# Patient Record
Sex: Female | Born: 1945
Health system: Southern US, Community
[De-identification: ages and names within clinical notes are randomized; demographics above are authoritative.]

## PROBLEM LIST (undated history)

## (undated) DIAGNOSIS — M1990 Unspecified osteoarthritis, unspecified site: Secondary | ICD-10-CM

## (undated) DIAGNOSIS — Z6841 Body Mass Index (BMI) 40.0 and over, adult: Secondary | ICD-10-CM

## (undated) DIAGNOSIS — R7303 Prediabetes: Secondary | ICD-10-CM

## (undated) DIAGNOSIS — J40 Bronchitis, not specified as acute or chronic: Secondary | ICD-10-CM

## (undated) DIAGNOSIS — K219 Gastro-esophageal reflux disease without esophagitis: Secondary | ICD-10-CM

## (undated) DIAGNOSIS — J329 Chronic sinusitis, unspecified: Secondary | ICD-10-CM

## (undated) DIAGNOSIS — I10 Essential (primary) hypertension: Secondary | ICD-10-CM

## (undated) DIAGNOSIS — I872 Venous insufficiency (chronic) (peripheral): Secondary | ICD-10-CM

## (undated) DIAGNOSIS — F419 Anxiety disorder, unspecified: Secondary | ICD-10-CM

## (undated) DIAGNOSIS — M810 Age-related osteoporosis without current pathological fracture: Secondary | ICD-10-CM

## (undated) DIAGNOSIS — J45909 Unspecified asthma, uncomplicated: Secondary | ICD-10-CM

## (undated) HISTORY — DX: Prediabetes: R73.03

## (undated) HISTORY — PX: HEEL SPUR SURGERY: SHX665

## (undated) HISTORY — PX: CARPAL TUNNEL RELEASE: SHX101

## (undated) HISTORY — DX: Unspecified asthma, uncomplicated: J45.909

## (undated) HISTORY — PX: REPLACEMENT TOTAL KNEE: SUR1224

## (undated) HISTORY — DX: Venous insufficiency (chronic) (peripheral): I87.2

## (undated) HISTORY — PX: ABDOMINAL HYSTERECTOMY: SHX81

## (undated) HISTORY — DX: Morbid (severe) obesity due to excess calories: E66.01

## (undated) HISTORY — PX: CHOLECYSTECTOMY: SHX55

## (undated) HISTORY — DX: Body Mass Index (BMI) 40.0 and over, adult: Z684

## (undated) HISTORY — DX: Unspecified osteoarthritis, unspecified site: M19.90

## (undated) HISTORY — DX: Chronic sinusitis, unspecified: J32.9

## (undated) HISTORY — PX: SHOULDER SURGERY: SHX246

## (undated) HISTORY — DX: Age-related osteoporosis without current pathological fracture: M81.0

---

## 2006-12-20 LAB — HM COLONOSCOPY

## 2009-09-21 ENCOUNTER — Ambulatory Visit: Payer: Self-pay | Admitting: Family Medicine

## 2009-11-25 ENCOUNTER — Ambulatory Visit: Payer: Self-pay | Admitting: Family Medicine

## 2010-03-29 ENCOUNTER — Ambulatory Visit: Payer: Self-pay | Admitting: Family Medicine

## 2010-06-17 ENCOUNTER — Encounter: Admission: RE | Admit: 2010-06-17 | Discharge: 2010-06-17 | Payer: Self-pay | Admitting: Family Medicine

## 2010-06-17 ENCOUNTER — Ambulatory Visit: Payer: Self-pay | Admitting: Family Medicine

## 2010-07-29 ENCOUNTER — Encounter: Payer: Self-pay | Admitting: Family Medicine

## 2010-08-19 ENCOUNTER — Ambulatory Visit: Payer: Self-pay | Admitting: Family Medicine

## 2010-08-19 DIAGNOSIS — I10 Essential (primary) hypertension: Secondary | ICD-10-CM | POA: Insufficient documentation

## 2010-08-19 DIAGNOSIS — I872 Venous insufficiency (chronic) (peripheral): Secondary | ICD-10-CM

## 2010-08-19 DIAGNOSIS — K219 Gastro-esophageal reflux disease without esophagitis: Secondary | ICD-10-CM | POA: Insufficient documentation

## 2010-08-19 HISTORY — DX: Venous insufficiency (chronic) (peripheral): I87.2

## 2010-08-20 ENCOUNTER — Encounter (INDEPENDENT_AMBULATORY_CARE_PROVIDER_SITE_OTHER): Payer: Self-pay

## 2010-09-01 ENCOUNTER — Encounter: Payer: Self-pay | Admitting: Family Medicine

## 2010-09-02 ENCOUNTER — Telehealth (INDEPENDENT_AMBULATORY_CARE_PROVIDER_SITE_OTHER): Payer: Self-pay

## 2010-09-02 ENCOUNTER — Encounter: Payer: Self-pay | Admitting: Family Medicine

## 2010-09-23 ENCOUNTER — Telehealth (INDEPENDENT_AMBULATORY_CARE_PROVIDER_SITE_OTHER): Payer: Self-pay | Admitting: *Deleted

## 2010-09-23 ENCOUNTER — Telehealth: Payer: Self-pay | Admitting: Family Medicine

## 2010-10-18 NOTE — Miscellaneous (Signed)
  Clinical Lists Changes  Observations: Added new observation of FLU VAX#1VIS: 04/12/10 version given August 20, 2010. (08/20/2010 9:57) Added new observation of FLU VAXLOT: [id number] (08/20/2010 9:57) Added new observation of FLU VAX EXP: 03/18/2011 (08/20/2010 9:57) Added new observation of FLU VAXBY: Levonne Spiller EMT-P (08/20/2010 9:57) Added new observation of FLU VAXRTE: IM (08/20/2010 9:57) Added new observation of FLU VAX DSE: 0.5 ml (08/20/2010 9:57) Added new observation of FLU VAXMFR: GlaxoSmithKline (08/20/2010 9:57) Added new observation of FLU VAX SITE: right deltoid (08/20/2010 9:57) Added new observation of FLU VAX: FLULAVAL (08/20/2010 9:57)      Immunizations Administered:  Influenza Vaccine:    Vaccine Type: FLULAVAL    Site: right deltoid    Mfr: GlaxoSmithKline    Dose: 0.5 ml    Route: IM    Given by: Levonne Spiller EMT-P    Exp. Date: 03/18/2011    Lot #: [id number]    VIS given: 04/12/10 version given August 20, 2010.  Flu Vaccine Consent Questions:    Do you have a history of severe allergic reactions to this vaccine? no    Any prior history of allergic reactions to egg and/or gelatin? no    Do you have a sensitivity to the preservative Thimersol? no    Do you have a past history of Guillan-Barre Syndrome? no    Do you currently have an acute febrile illness? no    Have you ever had a severe reaction to latex? no    Vaccine information given and explained to patient? yes    Are you currently pregnant? no

## 2010-10-18 NOTE — Assessment & Plan Note (Signed)
Summary: FOLLOW UP VISIT/JBB   Vital Signs:  Patient Profile:   65 Years Old Female CC:      Follow-up Visit Height:     67 inches Weight:      276 pounds BMI:     43.38 O2 Sat:      99 % O2 treatment:    Room Air Temp:     97.1 degrees F oral Pulse rate:   75 / minute Pulse rhythm:   regular Resp:     18 per minute BP sitting:   127 / 75  (left arm)  Pt. in pain?   yes    Location:   shoulder    Intensity:   6    Type:       aching  Vitals Entered By: Levonne Spiller EMT-P (August 19, 2010 2:47 PM)              Is Patient Diabetic? No  Does patient need assistance? Functional Status Self care Ambulation Normal      Current Allergies: ! * ORAL PREDNISONEHistory of Present Illness History from: patient Reason for visit: follow up of multiple problems Chief Complaint: Follow-up Visit History of Present Illness: The patient is presenting today for followup visit. She reports that she has been taking the omeprazole and she reports that the acid reflux has improved considerably to the point where she thinks she can come off the omeprazole at this time. She reports that she is feeling better. She reports that she is still suffering with the osteoarthritis especially in the right knee. She did have a left knee total replacement. She reports that she is seeing an orthopedic specialist that she reports that she has received a steroid injection in the right knee but the effects of it have worn off 3 months. She's now having significant pain in the right knee. She reports that with walking she has significant pain. She also has pain in the shoulders and the lower back. He reports that she is starting to wear her prescription compression hoses more now that she had done in the past. She reports that her legs feel much better when she wears the compression hoses. In addition, she reports that she's tolerating her blood pressure medication with no difficulty. She reports that otherwise he  feels good. She would like to go ahead and get a flu vaccine this year.  She also reports that she is in the process of applying for disability benefits.  She would like for me to send her a letter describing that she has been complaining of osteoarthritis over the past year which she has.  I told her that I could give her a note and I would also send to get her records from orthopedics to get that information as well.    REVIEW OF SYSTEMS Constitutional Symptoms      Denies fever, chills, night sweats, weight loss, weight gain, and fatigue.  Eyes       Complains of change in vision.      Denies eye pain, eye discharge, glasses, contact lenses, and eye surgery. Ear/Nose/Throat/Mouth       Denies hearing loss/aids, change in hearing, ear pain, ear discharge, dizziness, frequent runny nose, frequent nose bleeds, sinus problems, sore throat, hoarseness, and tooth pain or bleeding.  Respiratory       Denies dry cough, productive cough, wheezing, shortness of breath, asthma, bronchitis, and emphysema/COPD.  Cardiovascular       Denies murmurs, chest pain,  and tires easily with exhertion.    Gastrointestinal       Denies stomach pain, nausea/vomiting, diarrhea, constipation, blood in bowel movements, and indigestion. Genitourniary       Denies painful urination, kidney stones, and loss of urinary control. Neurological       Denies paralysis, seizures, and fainting/blackouts. Musculoskeletal       Complains of joint pain, joint stiffness, decreased range of motion, and swelling.      Denies muscle pain, redness, muscle weakness, and gout.      Comments: chronic venous insufficiency and swelling bilateral lower extremities Skin       Denies bruising, unusual mles/lumps or sores, and hair/skin or nail changes.  Psych       Denies mood changes, temper/anger issues, anxiety/stress, speech problems, depression, and sleep problems.  Past History:  Past Medical History: Hypertension Osteoarthritis    Bilateral venous stasis edema bilateral lower extremities-severe Overweight/obesity GERD  Past Surgical History: Left knee total replacement  Family History: hypertension Diabetes mellitus Overweight/obesity  Social History: Married (second) 2008 Never Smoked Alcohol use-no Drug use-no Occupation: Producer, television/film/video  Smoking Status:  never Drug Use:  no Physical Exam General appearance: well developed, well nourished, no acute distress Head: normocephalic, atraumatic Eyes: conjunctivae and lids normal Pupils: equal, round, reactive to light Ears: normal, no lesions or deformities Nasal: mucosa pink, nonedematous, no septal deviation, turbinates normal Oral/Pharynx: tongue normal, posterior pharynx without erythema or exudate Neck: neck supple,  trachea midline, no masses Chest/Lungs: no rales, wheezes, or rhonchi bilateral, breath sounds equal without effort Heart: regular rate and  rhythm, no murmur Abdomen: soft, non-tender without obvious organomegaly Extremities: 3+ edema bilateral lower extremities Neurological: grossly intact and non-focal Back: tenderness in lower back, tight paraspinal muscles bilateral Skin: no obvious rashes or lesions MSE: Him Assessment New Problems: OSTEOARTHROS UNSPEC GEN/LOC OTH SPEC SITES (ICD-715.98) UNSPECIFIED VENOUS INSUFFICIENCY (ICD-459.81) GERD (ICD-530.81) UNSPECIFIED ESSENTIAL HYPERTENSION (ICD-401.9)   Patient Education: Patient and/or caregiver instructed in the following: rest, exercise, weight loss. The risks, benefits and possible side effects were clearly explained and discussed with the patient.  The patient verbalized clear understanding.  The patient was given instructions to return if symptoms don't improve, worsen or new changes develop.  If it is not during clinic hours and the patient cannot get back to this clinic then the patient was told to seek medical care at an available urgent care or emergency department.  The  patient verbalized understanding.   Demonstrates willingness to comply.  Plan Planning Comments:   I have recommened that the patient continue to follow closely with her orthopedic specialist regarding her severe osteoarthritis in the knees and lower back pain. The patient is saying that she can no longer stand for long periods of time because of the severe osteoarthritis. She can no longer do hair because of the chronic venous insufficiency that has developed in her legs and the severe edema. I told her that she could discontinue the omeprazole at this time but to keep some of the medication available to use if she has recurrent symptoms of acid reflux. She verbalized understanding. The patient received a flu vaccine today. The patient tolerated it well.  I told the patient that I would send to her orthopedist to get records of her visits and I would be willing to write her a letter regarding the symptoms of osteoarthritis that she's been having in the right knee and complaints of generalized bodyaches in the shoulders and lower back.  Follow Up: 3 months  The patient and/or caregiver has been counseled thoroughly with regard to medications prescribed including dosage, schedule, interactions, rationale for use, and possible side effects and they verbalize understanding.  Diagnoses and expected course of recovery discussed and will return if not improved as expected or if the condition worsens. Patient and/or caregiver verbalized understanding.  The patient received an influenza vaccine today and tolerated well.   Patient Instructions: 1)  Please wear your prescription compression hoses on a daily basis to try and improve the chronic swelling in the lower legs. 2)  Check your blood sugars regularly. If your readings are usually above 200 or below 70 you should contact our office. 3)  Check your Blood Pressure regularly. If it is above: 140/90 you should make an appointment. 4)  Low sodium diet  recommended as we discussed 5)  I plan to check blood work when you come back for your next visit.  Please call us if you would like to have your fasting blood work done in [place] and have the results sent here before your next scheduled appointment.  6)  The patient was informed that there is no on-call provider or services available at this clinic during off-hours (when the clinic is closed).  If the patient developed a problem or concern that required immediate attention, the patient was advised to go the the nearest available urgent care or emergency department for medical care.  The patient verbalized understanding.

## 2010-10-18 NOTE — Progress Notes (Signed)
Summary: Office Visit  Office Visit   Imported By: Rosine Beat 07/29/2010 12:36:10  _____________________________________________________________________  External Attachment:    Type:   Image     Comment:   External Document

## 2010-10-20 NOTE — Letter (Signed)
Summary: Letter  Letter   Imported By: Dorna Leitz 09/02/2010 16:29:34  _____________________________________________________________________  External Attachment:    Type:   Image     Comment:   External Document

## 2010-10-20 NOTE — Progress Notes (Signed)
Summary: Orthopaedic visit  Orthopaedic visit   Imported By: Dorna Leitz 09/01/2010 13:56:27  _____________________________________________________________________  External Attachment:    Type:   Image     Comment:   External Document

## 2010-10-20 NOTE — Progress Notes (Addendum)
  Phone Note Call from Patient   Caller: Patient Summary of Call: Patient called to talk to Dr. Laural Benes regarding an appointment for the first of next week.  She needs to talk to the doctor regarding her husband.  You can contact the patient at [phone]. Initial call taken by: Dorna Leitz,  September 23, 2010 2:35 PM  Follow-up for Phone Call        Phone Call Completed Follow-up by: Levonne Spiller EMT-P,  September 23, 2010 2:50 PM

## 2010-10-20 NOTE — Progress Notes (Signed)
  Phone Note Call from Patient   Caller: Patient Summary of Call: Patient called to talk to Dr. Laural Benes regarding an appointment for the first of next week.  She needs to talk to the doctor regarding her husband.  You can contact the patient at [phone]. Initial call taken by: Dorna Leitz,  September 23, 2010 2:55 PM  Follow-up for Phone Call        Pt. made an appt for her husband, Grenda Lora. Mr. Theissen will be seeing Dr Laural Benes for a follow-up visit at [facility]. The appt is set for Monday 09/26/2010 @ 11:00 Follow-up by: Levonne Spiller EMT-P,  September 23, 2010 4:04 PM

## 2010-10-20 NOTE — Letter (Signed)
Summary: Handicapped placard for perm diability  Handicapped placard for perm diability   Imported By: Dorna Leitz 09/02/2010 16:28:33  _____________________________________________________________________  External Attachment:    Type:   Image     Comment:   External Document

## 2010-10-20 NOTE — Letter (Signed)
Summary: Generic Letter  [facility]  [address]   [place], [place] [address]   Phone: [phone]  Fax: [phone]    09/02/2010  [name] [address] [place], [place]  [address]  TO WHOM IT MAY CONCERN:  This letter is to confirm that Megan Perez (DOB 1945/10/05) has been a patient that I have been following for over 1 year.  With the patient's request and permission I am writing this letter to confirm that she has been treated by me and other medical providers for severe osteoarthritis.  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    This patient has had a left total knee replacement and suffers from degenerative osteoarthritis of the right knee.  She has been followed for this by an orthopedic specialist and I have reviewed the records fromt the office visit that do confirm osteoarthritis - degenerative and highly symptomatic in the right knee joint requiring treatments.  The patient will most likely require ongoing orthopedic care from the orthopedist for this condition and may require knee replacement or more invasive treatments in the future.         Sincerely,   Standley Dakins MD

## 2010-10-20 NOTE — Progress Notes (Signed)
  Phone Note Refill Request   Caller: Patient Reason for Call: Refill Medication Summary of Call: Patient called because she needs a refill on Maloxicam 15 miligram.  Please call the prescription at the [facility] pharmacy in [place], [place].  She also would like for Dr. Laural Benes to call regarding some information they had discuss at her last visit.  She stated she did not receive the information yet.  The patient's number is [phone] Initial call taken by: Dorna Leitz,  September 02, 2010 11:37 AM  Follow-up for Phone Call        Pt. refill request was sent to the [facility] [address] Steele Sizer. Pt notified about not receiving the orthopedic records yet. Pt advise she will have the records sent to this office. At that time, Dr. Laural Benes will write her the letter she requires. Follow-up by: Levonne Spiller EMT-P,  September 02, 2010 12:25 PM    Prescriptions: MELOXICAM 15 MG TABS (MELOXICAM) take 1 by mouth daily for arthritis pain  #30 x 2   Entered by:   Levonne Spiller EMT-P   Authorized by:   Standley Dakins MD   Signed by:   Levonne Spiller EMT-P on 09/02/2010   Method used:   Electronically to        [facility] (retail)       [address]       [place], [place]  [address]       Ph: [phone]       Fax: [phone]   RxID:   [id number]

## 2010-11-24 ENCOUNTER — Telehealth: Payer: Self-pay | Admitting: Family Medicine

## 2010-11-25 ENCOUNTER — Telehealth: Payer: Self-pay | Admitting: Family Medicine

## 2010-11-29 NOTE — Progress Notes (Signed)
  Phone Note Call from Patient   Summary of Call: PATIENT CALLED STATED THAT SHE IS OUT OF MEDICATION  HAVE NO MORE REFILLS OMEPRAZOLE 20 MG PATIENT ALSO WANTED TO KNOW COULD YOU CALL IT IN FOR 90 DAY SUPPLY..  IF NEEDED TO CONTACT HER PLEASE CALL HER HOME NUIMBER  Initial call taken by: Eugenio Hoes,  November 24, 2010 2:09 PM  Follow-up for Phone Call        Please find out which pharmacy I need to send the prescriptions to. Thanks. Follow-up by: Standley Dakins MD,  November 25, 2010 10:55 AM    New/Updated Medications: OMEPRAZOLE 20 MG TBEC (OMEPRAZOLE) take 1 by mouth every morning Prescriptions: OMEPRAZOLE 20 MG TBEC (OMEPRAZOLE) take 1 by mouth every morning  #90 x 1   Entered and Authorized by:   Standley Dakins MD   Signed by:   Standley Dakins MD on 11/25/2010   Method used:   Electronically to        [facility] (retail)       [address]       [place], [place]  [address]       Ph: [phone]       Fax: [phone]   RxID:   [id number]

## 2010-11-29 NOTE — Progress Notes (Signed)
  Phone Note Call from Patient   Summary of Call: Megan Perez CALLED YESTERDAY SHE WANTED TO TALK WITH YOU  PLEASE CALL HER @ [phone] Initial call taken by: Eugenio Hoes,  November 25, 2010 2:08 PM  Follow-up for Phone Call        Called and spoke with patient .  Her husband is having bilateral nephrectomy done on 3/19 at [facility] and then being transferred to [facility].    Follow-up by: Standley Dakins MD,  November 25, 2010 2:38 PM

## 2010-12-06 ENCOUNTER — Telehealth: Payer: Self-pay | Admitting: Internal Medicine

## 2010-12-15 NOTE — Progress Notes (Signed)
  Phone Note Call from Patient   Summary of Call:  need refill on moloxicam 15 mg patient request to have a 3 months supply please send to [facility] [phone] out of prescription s have no refill request. Advise patient would send to Dr.Jaythan Hinely because Dr. Laural Benes will not be in until thursday Initial call taken by: Eugenio Hoes,  December 06, 2010 2:43 PM    Prescriptions: MELOXICAM 15 MG TABS (MELOXICAM) take 1 by mouth daily for arthritis pain  #30 x 0   Entered and Authorized by:   J. Juline Patch MD   Signed by:   Shela Commons. Juline Patch MD on 12/06/2010   Method used:   Electronically to        [facility] (retail)       [address]       [place], [place]  [address]       Ph: [phone]       Fax: [phone]   RxID:   [id number]

## 2011-01-16 ENCOUNTER — Encounter (INDEPENDENT_AMBULATORY_CARE_PROVIDER_SITE_OTHER): Payer: PRIVATE HEALTH INSURANCE | Admitting: Family Medicine

## 2011-01-16 DIAGNOSIS — M129 Arthropathy, unspecified: Secondary | ICD-10-CM

## 2011-01-16 DIAGNOSIS — R7309 Other abnormal glucose: Secondary | ICD-10-CM

## 2011-01-16 DIAGNOSIS — I10 Essential (primary) hypertension: Secondary | ICD-10-CM

## 2011-01-16 DIAGNOSIS — E669 Obesity, unspecified: Secondary | ICD-10-CM

## 2011-01-16 DIAGNOSIS — Z79899 Other long term (current) drug therapy: Secondary | ICD-10-CM

## 2011-01-16 DIAGNOSIS — Z0000 Encounter for general adult medical examination without abnormal findings: Secondary | ICD-10-CM

## 2011-02-01 ENCOUNTER — Telehealth: Payer: Self-pay | Admitting: Family Medicine

## 2011-02-02 NOTE — Telephone Encounter (Signed)
Faxed order in

## 2011-03-30 ENCOUNTER — Other Ambulatory Visit: Payer: Self-pay

## 2011-03-30 MED ORDER — LISINOPRIL 20 MG PO TABS: 20.0000 mg | ORAL_TABLET | Freq: Every day | ORAL | Status: DC

## 2011-04-04 ENCOUNTER — Other Ambulatory Visit: Payer: Self-pay

## 2011-04-04 MED ORDER — LISINOPRIL 20 MG PO TABS: 20.0000 mg | ORAL_TABLET | Freq: Every day | ORAL | Status: DC

## 2011-06-12 ENCOUNTER — Telehealth: Payer: Self-pay | Admitting: Family Medicine

## 2011-06-12 MED ORDER — LISINOPRIL 20 MG PO TABS: 20.0000 mg | ORAL_TABLET | Freq: Every day | ORAL | Status: DC

## 2011-06-12 NOTE — Telephone Encounter (Signed)
Fax came in med reordered

## 2011-06-13 ENCOUNTER — Other Ambulatory Visit: Payer: Self-pay | Admitting: *Deleted

## 2011-06-13 DIAGNOSIS — K219 Gastro-esophageal reflux disease without esophagitis: Secondary | ICD-10-CM

## 2011-06-13 MED ORDER — OMEPRAZOLE 20 MG PO CPDR: 20.0000 mg | DELAYED_RELEASE_CAPSULE | Freq: Every day | ORAL | Status: DC

## 2011-08-31 ENCOUNTER — Encounter: Payer: Self-pay | Admitting: Family Medicine

## 2011-08-31 ENCOUNTER — Ambulatory Visit (INDEPENDENT_AMBULATORY_CARE_PROVIDER_SITE_OTHER): Payer: PRIVATE HEALTH INSURANCE | Admitting: Family Medicine

## 2011-08-31 VITALS — BP 136/72 | HR 77 | Wt 275.0 lb

## 2011-08-31 DIAGNOSIS — I10 Essential (primary) hypertension: Secondary | ICD-10-CM

## 2011-08-31 DIAGNOSIS — M1710 Unilateral primary osteoarthritis, unspecified knee: Secondary | ICD-10-CM

## 2011-08-31 DIAGNOSIS — R7309 Other abnormal glucose: Secondary | ICD-10-CM

## 2011-08-31 DIAGNOSIS — M1711 Unilateral primary osteoarthritis, right knee: Secondary | ICD-10-CM

## 2011-08-31 DIAGNOSIS — R7302 Impaired glucose tolerance (oral): Secondary | ICD-10-CM

## 2011-08-31 DIAGNOSIS — E669 Obesity, unspecified: Secondary | ICD-10-CM

## 2011-08-31 NOTE — Patient Instructions (Addendum)
Switch to Tylenol to help with your aches and pains and see what that will do to help your swelling. Go from lying to sitting to standing to see what this will do to help with your dizziness

## 2011-08-31 NOTE — Progress Notes (Signed)
  Subjective:    Patient ID: Megan Perez, female    DOB: February 18, 1946, 65 y.o.   MRN: [id number]  HPI She is here for evaluation of dizziness. It occurs usually when she gets out of bed in the morning and goes away relatively quickly. It does not occur the rest of the day. She also has been having difficulty with swelling of the hands and feet that she notes is made worse when she uses Aleve. She is using Aleve to help with knee pain   Review of Systems     Objective:   Physical Exam alert and in no distress. EOMI. Tympanic membranes and canals are normal. Throat is clear. Tonsils are normal. Neck is supple without adenopathy or thyromegaly. Cardiac exam shows a regular sinus rhythm without murmurs or gallops. Lungs are clear to auscultation.        Assessment & Plan:   1. Arthritis of right knee  Amb ref  Medical Nutrition Therapy (MNT)  2. Obesity (BMI 30-39.9)  Amb ref  Medical Nutrition Therapy (MNT)  3. Hypertension  Amb ref  Medical Nutrition Therapy (MNT)  4. Glucose intolerance (impaired glucose tolerance)  Amb ref  Medical Nutrition Therapy (MNT)   I discussed her arthritis , risk for diabetes and knee pain in regard to her weight. I will refer her to medical nutrition. Also recommend she get up more slowly from a sitting position it will probably help with her transient dizziness which was probably position related. She will also switch to Tylenol to see if this will help with her edema.

## 2011-11-05 ENCOUNTER — Other Ambulatory Visit: Payer: Self-pay

## 2011-11-05 ENCOUNTER — Emergency Department (HOSPITAL_COMMUNITY)
Admission: EM | Admit: 2011-11-05 | Discharge: 2011-11-05 | Disposition: A | Discharge status: Home / Self Care | Payer: Medicare Other | Attending: Emergency Medicine | Admitting: Emergency Medicine

## 2011-11-05 ENCOUNTER — Emergency Department (HOSPITAL_COMMUNITY): Payer: Medicare Other

## 2011-11-05 ENCOUNTER — Encounter (HOSPITAL_COMMUNITY): Payer: Self-pay | Admitting: *Deleted

## 2011-11-05 DIAGNOSIS — I10 Essential (primary) hypertension: Secondary | ICD-10-CM | POA: Insufficient documentation

## 2011-11-05 DIAGNOSIS — R0602 Shortness of breath: Secondary | ICD-10-CM | POA: Insufficient documentation

## 2011-11-05 DIAGNOSIS — R079 Chest pain, unspecified: Secondary | ICD-10-CM | POA: Insufficient documentation

## 2011-11-05 DIAGNOSIS — J189 Pneumonia, unspecified organism: Secondary | ICD-10-CM | POA: Insufficient documentation

## 2011-11-05 DIAGNOSIS — R509 Fever, unspecified: Secondary | ICD-10-CM | POA: Insufficient documentation

## 2011-11-05 DIAGNOSIS — R51 Headache: Secondary | ICD-10-CM | POA: Insufficient documentation

## 2011-11-05 HISTORY — DX: Essential (primary) hypertension: I10

## 2011-11-05 LAB — CBC
HCT: 34.9 % — ABNORMAL LOW (ref 36.0–46.0)
Hemoglobin: 11.3 g/dL — ABNORMAL LOW (ref 12.0–15.0)
MCH: 29.5 pg (ref 26.0–34.0)
MCHC: 32.4 g/dL (ref 30.0–36.0)
MCV: 91.1 fL (ref 78.0–100.0)
Platelets: 295 10*3/uL (ref 150–400)
RBC: 3.83 MIL/uL — ABNORMAL LOW (ref 3.87–5.11)
RDW: 15 % (ref 11.5–15.5)
WBC: 4.5 10*3/uL (ref 4.0–10.5)

## 2011-11-05 LAB — DIFFERENTIAL
Basophils Absolute: 0 10*3/uL (ref 0.0–0.1)
Basophils Relative: 0 % (ref 0–1)
Eosinophils Absolute: 0.2 10*3/uL (ref 0.0–0.7)
Eosinophils Relative: 3 % (ref 0–5)
Lymphocytes Relative: 25 % (ref 12–46)
Lymphs Abs: 1.1 10*3/uL (ref 0.7–4.0)
Monocytes Absolute: 0.4 10*3/uL (ref 0.1–1.0)
Monocytes Relative: 9 % (ref 3–12)
Neutro Abs: 2.8 10*3/uL (ref 1.7–7.7)
Neutrophils Relative %: 62 % (ref 43–77)

## 2011-11-05 LAB — BASIC METABOLIC PANEL
BUN: 11 mg/dL (ref 6–23)
CO2: 28 mEq/L (ref 19–32)
Calcium: 9.2 mg/dL (ref 8.4–10.5)
Chloride: 102 mEq/L (ref 96–112)
Creatinine, Ser: 0.78 mg/dL (ref 0.50–1.10)
GFR calc Af Amer: >90 mL/min (ref >90)
GFR calc non Af Amer: 85 mL/min — ABNORMAL LOW (ref >90)
Glucose, Bld: 109 mg/dL — ABNORMAL HIGH (ref 70–99)
Potassium: 3.1 mEq/L — ABNORMAL LOW (ref 3.5–5.1)
Sodium: 141 mEq/L (ref 135–145)

## 2011-11-05 LAB — CARDIAC PANEL(CRET KIN+CKTOT+MB+TROPI)
CK, MB: 2.2 ng/mL (ref 0.3–4.0)
Relative Index: 1.3 (ref 0.0–2.5)
Total CK: 168 U/L (ref 7–177)
Troponin I: <0.3 ng/mL (ref <0.30)

## 2011-11-05 MED ORDER — LEVOFLOXACIN IN D5W 500 MG/100ML IV SOLN
500.0000 mg | INTRAVENOUS | Status: DC
  Administered 2011-11-05: 500 mg via INTRAVENOUS
  Filled 2011-11-05: qty 100

## 2011-11-05 MED ORDER — LEVOFLOXACIN 500 MG PO TABS: 500.0000 mg | ORAL_TABLET | Freq: Every day | ORAL | Status: AC

## 2011-11-05 MED ORDER — SODIUM CHLORIDE 0.9 % IV BOLUS (SEPSIS)
500.0000 mL | Freq: Once | INTRAVENOUS | Status: AC
  Administered 2011-11-05: 500 mL via INTRAVENOUS

## 2011-11-05 MED ORDER — MORPHINE SULFATE 2 MG/ML IJ SOLN
2.0000 mg | Freq: Once | INTRAMUSCULAR | Status: AC
  Administered 2011-11-05: 2 mg via INTRAVENOUS
  Filled 2011-11-05: qty 1

## 2011-11-05 MED ORDER — BENZONATATE 100 MG PO CAPS: 100.0000 mg | ORAL_CAPSULE | Freq: Three times a day (TID) | ORAL | Status: AC

## 2011-11-05 MED ORDER — TRAMADOL HCL 50 MG PO TABS: 50.0000 mg | ORAL_TABLET | Freq: Four times a day (QID) | ORAL | Status: AC | PRN

## 2011-11-05 NOTE — ED Notes (Signed)
Patient is resting comfortably. 

## 2011-11-05 NOTE — ED Notes (Signed)
Vital signs stable. 

## 2011-11-05 NOTE — ED Notes (Signed)
EMS 2 days, fever, chills, chest soreness, no n/v/d sinus headache

## 2011-11-05 NOTE — ED Notes (Signed)
Family at bedside. 

## 2011-11-05 NOTE — Discharge Instructions (Signed)

## 2011-11-05 NOTE — ED Provider Notes (Signed)
History     CSN: [id number]  Arrival date & time 11/05/11  4098   First MD Initiated Contact with Patient 11/05/11 [phone]      Chief Complaint  Patient presents with  . Influenza    (Consider location/radiation/quality/duration/timing/severity/associated sxs/prior treatment) Patient is a 66 y.o. female presenting with flu symptoms. The history is provided by the patient.  Influenza The current episode started 2 days ago. The problem has been gradually worsening. Associated symptoms include chest pain, headaches and shortness of breath. Pertinent negatives include no abdominal pain. The symptoms are aggravated by coughing.  Pt with productive cough of yellow sputum x 2 days with subjective fever and pleuritic chest pain worse with breathing and coughing. Pt also having frontal HA worse with cough. +sore throat. No neck stiffness or sick contacts. No lower ext swelling or pain. No recent travel.   Past Medical History  Diagnosis Date  . Hypertension     Past Surgical History  Procedure Date  . Knee surgery   . Abdominal hysterectomy     No family history on file.  History  Substance Use Topics  . Smoking status: Never Smoker   . Smokeless tobacco: Never Used  . Alcohol Use: No    OB History    Grav Para Term Preterm Abortions TAB SAB Ect Mult Living                  Review of Systems  Constitutional: Positive for fever and fatigue.  HENT: Positive for congestion and sore throat. Negative for ear pain, neck pain, neck stiffness and sinus pressure.   Respiratory: Positive for cough and shortness of breath. Negative for stridor.   Cardiovascular: Positive for chest pain. Negative for palpitations and leg swelling.  Gastrointestinal: Negative for nausea, vomiting and abdominal pain.  Genitourinary: Negative for dysuria and flank pain.  Musculoskeletal: Positive for back pain. Negative for joint swelling.  Skin: Negative for color change, pallor and rash.  Neurological:  Positive for headaches. Negative for weakness and numbness.    Allergies  Prednisone  Home Medications   Current Outpatient Rx  Name Route Sig Dispense Refill  . ACETAMINOPHEN 500 MG PO TABS Oral Take 1,000 mg by mouth every 6 (six) hours as needed. For pain.    . ASPIRIN 81 MG PO TABS Oral Take 81 mg by mouth daily.      . FUROSEMIDE 40 MG PO TABS Oral Take 40 mg by mouth daily.      [name] LISINOPRIL 20 MG PO TABS Oral Take 1 tablet (20 mg total) by mouth daily. 90 tablet 3  . OMEPRAZOLE 20 MG PO CPDR Oral Take 1 capsule (20 mg total) by mouth daily. 30 capsule 4  . BENZONATATE 100 MG PO CAPS Oral Take 1 capsule (100 mg total) by mouth every 8 (eight) hours. 21 capsule 0  . LEVOFLOXACIN 500 MG PO TABS Oral Take 1 tablet (500 mg total) by mouth daily. 7 tablet 0  . TRAMADOL HCL 50 MG PO TABS Oral Take 1 tablet (50 mg total) by mouth every 6 (six) hours as needed for pain. 15 tablet 0    BP 129/94  Pulse 81  Temp(Src) 98.1 F (36.7 C) (Oral)  Resp 18  Ht 5\' 7"  (1.702 m)  Wt 255 lb (115.667 kg)  BMI 39.94 kg/m2  SpO2 99%  Physical Exam  Nursing note and vitals reviewed. Constitutional: She is oriented to person, place, and time. She appears well-developed and well-nourished. No distress.  HENT:  Head: Normocephalic and atraumatic.  Mouth/Throat: Oropharynx is clear and moist.  Eyes: EOM are normal. Pupils are equal, round, and reactive to light.  Neck: Normal range of motion. Neck supple.       No meningismus   Cardiovascular: Normal rate and regular rhythm.   Pulmonary/Chest: Effort normal. No respiratory distress. She has no wheezes. She has rales (Scattered rales). She exhibits tenderness (Chest pain reproduced with pressure over sternum).  Abdominal: Soft. Bowel sounds are normal. There is no tenderness. There is no rebound and no guarding.  Musculoskeletal: Normal range of motion. She exhibits no edema and no tenderness.  Neurological: She is alert and oriented to person,  place, and time.  Skin: Skin is warm and dry. No rash noted. No erythema.  Psychiatric: She has a normal mood and affect. Her behavior is normal.    ED Course  Procedures (including critical care time)  Labs Reviewed  CBC - Abnormal; Notable for the following:    RBC 3.83 (*)    Hemoglobin 11.3 (*)    HCT 34.9 (*)    All other components within normal limits  BASIC METABOLIC PANEL - Abnormal; Notable for the following:    Potassium 3.1 (*)    Glucose, Bld 109 (*)    GFR calc non Af Amer 85 (*)    All other components within normal limits  DIFFERENTIAL  CARDIAC PANEL(CRET KIN+CKTOT+MB+TROPI)   Dg Chest 2 View  11/05/2011  *RADIOLOGY REPORT*  Clinical Data: Cough.  Fever.  Shortness of breath and chest pain for 2 days.  Hypertension.  CHEST - 2 VIEW  Comparison: CT of 06/17/2010  Findings: Lateral view degraded by patient arm position.  Midline trachea.  Borderline cardiomegaly, accentuated by low lung volumes. No pleural effusion or pneumothorax.  Interstitial prominence may be partially technique related.  There is right infrahilar soft tissue fullness.  Mild volume loss at the left lung base.  Cholecystectomy clips.  IMPRESSION:  1.  Right infrahilar soft tissue fullness.  Given the clinical history of fever and cough, most likely an area of infection. Recommend appropriate antibiotic therapy and short-term interval plain film follow-up. If this persists, CT would be recommended to exclude underlying mass/adenopathy and postobstructive atelectasis or pneumonitis. 2.  Borderline cardiomegaly. 3.  Interstitial prominence, possibly related to low lung volumes/poor inspiratory effort.  Original Report Authenticated By: Consuello Bossier, M.D.     1. Pneumonia      Date: 11/05/2011  Rate: 97  Rhythm: normal sinus rhythm  QRS Axis: normal  Intervals: normal  ST/T Wave abnormalities: nonspecific ST changes  Conduction Disutrbances:none  Narrative Interpretation:   Old EKG Reviewed: none  available    MDM   Given nature of chest pain, duration, negative trop, normal EKG, and alternative cause for pain, CAD is very unlikely. F/u with PMD to assure resolution of PNA.        Loren Racer, MD 11/05/11 1415

## 2011-11-05 NOTE — ED Notes (Signed)
Patient transported to X-ray 

## 2011-11-05 NOTE — ED Notes (Signed)
MD at bedside. 

## 2011-11-15 ENCOUNTER — Encounter: Payer: Self-pay | Admitting: Internal Medicine

## 2011-11-18 ENCOUNTER — Other Ambulatory Visit: Payer: Self-pay | Admitting: Family Medicine

## 2011-11-24 ENCOUNTER — Ambulatory Visit (INDEPENDENT_AMBULATORY_CARE_PROVIDER_SITE_OTHER): Payer: Medicare Other | Admitting: Family Medicine

## 2011-11-24 ENCOUNTER — Encounter: Payer: Self-pay | Admitting: Family Medicine

## 2011-11-24 DIAGNOSIS — Z8701 Personal history of pneumonia (recurrent): Secondary | ICD-10-CM

## 2011-11-24 DIAGNOSIS — K219 Gastro-esophageal reflux disease without esophagitis: Secondary | ICD-10-CM

## 2011-11-24 DIAGNOSIS — M129 Arthropathy, unspecified: Secondary | ICD-10-CM

## 2011-11-24 DIAGNOSIS — M1990 Unspecified osteoarthritis, unspecified site: Secondary | ICD-10-CM | POA: Insufficient documentation

## 2011-11-24 MED ORDER — TRAMADOL HCL 50 MG PO TABS: 50.0000 mg | ORAL_TABLET | Freq: Three times a day (TID) | ORAL | Status: AC | PRN

## 2011-11-24 MED ORDER — OMEPRAZOLE 20 MG PO CPDR: 20.0000 mg | DELAYED_RELEASE_CAPSULE | Freq: Every day | ORAL | Status: DC

## 2011-11-24 NOTE — Progress Notes (Signed)
  Subjective:    Patient ID: Megan Perez, female    DOB: 02-Jul-1946, 66 y.o.   MRN: [id number]  HPI She is here for recheck. She was recently in the hospital and evaluated for pneumonia. The record was reviewed. She states that she is doing much better and only having a slight cough. She does have reflux symptoms and uses Prilosec for this. She does have a previous history of GI bleed and states that Tylenol was not controlling her arthritis symptoms.   Review of Systems     Objective:   Physical Exam alert and in no distress. Tympanic membranes and canals are normal. Throat is clear. Tonsils are normal. Neck is supple without adenopathy or thyromegaly. Cardiac exam shows a regular sinus rhythm without murmurs or gallops. Lungs are clear to auscultation.       Assessment & Plan:   1. GERD   2. History of pneumonia   3. Arthritis   4 history of GI bleed I will give her tramadol to use for her arthritis symptoms. She will continue on her other medications. No antibiotic needed at this time since she is doing better.

## 2011-12-21 ENCOUNTER — Telehealth: Payer: Self-pay | Admitting: Family Medicine

## 2011-12-21 MED ORDER — CELECOXIB 200 MG PO CAPS: 200.0000 mg | ORAL_CAPSULE | Freq: Two times a day (BID) | ORAL | Status: AC

## 2011-12-21 NOTE — Telephone Encounter (Signed)
Pt called and states she cannot take the Tramadol.  Makes her cry uncontrollable, depressed, confused.  She asked that you call her.  I advised her not to take anymore Tramadol until she heard from you.       Her pharmacy is [facility]

## 2011-12-21 NOTE — Telephone Encounter (Signed)
Pt called stating that her pharmacy called and said that celebrex would be $354.00 and that she would like something else. i did give her samples of celebrex. She also states that she had been taking tylenol and he has not helped any.

## 2011-12-21 NOTE — Telephone Encounter (Signed)
Explained to her to continue to use Tylenol to help with her arthritis and I called in the Celebrex. She is to use this sparingly.

## 2011-12-21 NOTE — Telephone Encounter (Signed)
Time to refer her to orthopedics if the knee is giving her that much trouble, time to consider joint replacement

## 2011-12-21 NOTE — Telephone Encounter (Signed)
Called left message for pt to call me back .

## 2011-12-21 NOTE — Telephone Encounter (Signed)
Tramadol apparently is causing CNS side effects. I will recommend she use Tylenol as a first choice for her arthritis and Celebrex sparingly as needed.

## 2011-12-25 NOTE — Telephone Encounter (Signed)
Pt doesn't want to have surgery and will call ortho to get appointment

## 2012-02-01 ENCOUNTER — Encounter (HOSPITAL_COMMUNITY): Payer: Self-pay | Admitting: Physical Medicine and Rehabilitation

## 2012-02-01 ENCOUNTER — Emergency Department (HOSPITAL_COMMUNITY)
Admission: EM | Admit: 2012-02-01 | Discharge: 2012-02-01 | Disposition: A | Discharge status: Home / Self Care | Payer: Medicare Other | Attending: Emergency Medicine | Admitting: Emergency Medicine

## 2012-02-01 ENCOUNTER — Emergency Department (HOSPITAL_COMMUNITY): Payer: Medicare Other

## 2012-02-01 DIAGNOSIS — M25559 Pain in unspecified hip: Secondary | ICD-10-CM | POA: Insufficient documentation

## 2012-02-01 DIAGNOSIS — IMO0002 Reserved for concepts with insufficient information to code with codable children: Secondary | ICD-10-CM | POA: Insufficient documentation

## 2012-02-01 DIAGNOSIS — I10 Essential (primary) hypertension: Secondary | ICD-10-CM | POA: Insufficient documentation

## 2012-02-01 DIAGNOSIS — M1710 Unilateral primary osteoarthritis, unspecified knee: Secondary | ICD-10-CM | POA: Insufficient documentation

## 2012-02-01 DIAGNOSIS — M79609 Pain in unspecified limb: Secondary | ICD-10-CM

## 2012-02-01 DIAGNOSIS — R209 Unspecified disturbances of skin sensation: Secondary | ICD-10-CM | POA: Insufficient documentation

## 2012-02-01 DIAGNOSIS — Z7982 Long term (current) use of aspirin: Secondary | ICD-10-CM | POA: Insufficient documentation

## 2012-02-01 LAB — URINALYSIS, ROUTINE W REFLEX MICROSCOPIC
Bilirubin Urine: NEGATIVE
Glucose, UA: NEGATIVE mg/dL
Hgb urine dipstick: NEGATIVE
Ketones, ur: NEGATIVE mg/dL
Nitrite: NEGATIVE
Protein, ur: NEGATIVE mg/dL
Specific Gravity, Urine: 1.007 (ref 1.005–1.030)
Urobilinogen, UA: 0.2 mg/dL (ref 0.0–1.0)
pH: 7 (ref 5.0–8.0)

## 2012-02-01 LAB — URINE MICROSCOPIC-ADD ON

## 2012-02-01 MED ORDER — HYDROCODONE-ACETAMINOPHEN 5-325 MG PO TABS: 2.0000 | ORAL_TABLET | ORAL | Status: AC | PRN

## 2012-02-01 MED ORDER — HYDROCODONE-ACETAMINOPHEN 5-325 MG PO TABS
2.0000 | ORAL_TABLET | Freq: Once | ORAL | Status: AC
  Administered 2012-02-01: 2 via ORAL
  Filled 2012-02-01: qty 2

## 2012-02-01 MED ORDER — IBUPROFEN 800 MG PO TABS
800.0000 mg | ORAL_TABLET | Freq: Once | ORAL | Status: DC
  Filled 2012-02-01: qty 1

## 2012-02-01 MED ORDER — IBUPROFEN 800 MG PO TABS: 800.0000 mg | ORAL_TABLET | Freq: Three times a day (TID) | ORAL | Status: AC

## 2012-02-01 NOTE — ED Provider Notes (Signed)
History   This chart was scribed for Glynn Octave, MD by Charolett Bumpers . The patient was seen in room STRE8/STRE8.    CSN: [id number]  Arrival date & time 02/01/12  1036   First MD Initiated Contact with Patient 02/01/12 1117      Chief Complaint  Patient presents with  . Hip Pain    (Consider location/radiation/quality/duration/timing/severity/associated sxs/prior treatment) HPI Megan Perez is a 66 y.o. female who presents to the Emergency Department complaining of constant, moderate left-sided hip pain for the past 3 days. Patient rates her hip pain as 8/10.Patient states that her toes have been numb, but states this is not a new symptoms. Patient denies any weakness or tingling of extremities. Patient states that she is able to ambulate with a cane, but does not usually ambulate with cane. Patient states that nothing makes the hip pain better or worse. Patient states that she has been taking Tylenol with no relief. Patient reports a h/o arthritis.    Past Medical History  Diagnosis Date  . Hypertension   . Prediabetes   . OA (osteoarthritis)     knee    Past Surgical History  Procedure Date  . Knee surgery   . Abdominal hysterectomy   . Cholecystectomy   . Replacement total knee     left    No family history on file.  History  Substance Use Topics  . Smoking status: Never Smoker   . Smokeless tobacco: Never Used  . Alcohol Use: No    OB History    Grav Para Term Preterm Abortions TAB SAB Ect Mult Living                  Review of Systems  Constitutional: Negative for fever.  Gastrointestinal: Negative for vomiting.  Musculoskeletal:       Left hip pain.   All other systems reviewed and are negative.    Allergies  Prednisone  Home Medications   Current Outpatient Rx  Name Route Sig Dispense Refill  . ACETAMINOPHEN 500 MG PO TABS Oral Take 1,000 mg by mouth every 6 (six) hours as needed. For pain.    . ASPIRIN 81 MG PO TABS Oral Take  81 mg by mouth daily.      . FUROSEMIDE 40 MG PO TABS Oral Take 40 mg by mouth daily.      [name] LISINOPRIL 20 MG PO TABS Oral Take 1 tablet (20 mg total) by mouth daily. 90 tablet 3  . ADULT MULTIVITAMIN W/MINERALS CH Oral Take 1 tablet by mouth daily.      BP 135/83  Pulse 81  Temp(Src) 98.6 F (37 C) (Oral)  Resp 20  SpO2 97%  Physical Exam  Nursing note and vitals reviewed. Constitutional: She is oriented to person, place, and time. She appears well-developed and well-nourished. No distress.  HENT:  Head: Normocephalic and atraumatic.  Eyes: EOM are normal.  Neck: Normal range of motion. Neck supple. No tracheal deviation present.  Cardiovascular: Normal rate.   Pulmonary/Chest: Effort normal. No respiratory distress.  Abdominal: Soft. There is no tenderness.  Musculoskeletal: Normal range of motion.       Pain with external rotation of left hip. Full ROM. No effusions. Tenderness to palpation of left hip. 5/5 strength of lower extremities bilaterally. +2 DP and TP pulses. Back non-tender.   Neurological: She is alert and oriented to person, place, and time.  Skin: Skin is warm and dry.  Psychiatric: She has a normal  mood and affect. Her behavior is normal.    ED Course  Procedures (including critical care time)  DIAGNOSTIC STUDIES: Oxygen Saturation is 97% on room air, normal by my interpretation.    COORDINATION OF CARE:  1127: Discussed planned course of treatment with the patient, who is agreeable at this time.  1244: Recheck: Patient ambulatory in ED. Patient states pain has improved. Will d/c.    Labs Reviewed  URINALYSIS, ROUTINE W REFLEX MICROSCOPIC - Abnormal; Notable for the following:    Leukocytes, UA LARGE (*)    All other components within normal limits  URINE MICROSCOPIC-ADD ON   Dg Hip Complete Left  02/01/2012  *RADIOLOGY REPORT*  Clinical Data: Left hip pain.  LEFT HIP - COMPLETE 2+ VIEW  Comparison: None  Findings: Both hips are normally located.   No acute bony findings, significant degenerative changes or evidence of avascular necrosis. The pubic symphysis and SI joints are intact.  IMPRESSION: No acute bony findings or significant degenerative changes.  Original Report Authenticated By: P. Loralie Champagne, M.D.     No diagnosis found.    MDM  Atraumatic hip pain without fever, reduced range of motion, weakness, numbness or tingling. Hx arthritis.  No overlying skin changes, no effusion, warmth, erythema. FROM w/o pain.  Patient did have recent car travel.  Ambulatory in the ED without assistance.  UA negative for infection. Doppler negative for DVT.  I personally performed the services described in this documentation, which was scribed in my presence.  The recorded information has been reviewed and considered.      Glynn Octave, MD 02/01/12 [phone]

## 2012-02-01 NOTE — ED Notes (Signed)
Pt presents to department for evaluation of L sided hip pain. Ongoing x2 days. States soreness and pain that increases with movement/walking. Denies recent injury to area. 8/10 pain upon arrival, ambulatory with cane. She is alert and oriented x4. No acute distress noted.

## 2012-02-01 NOTE — ED Notes (Signed)
Patient ambulated well to BR and Back from stretcher 8.  No assist.

## 2012-02-01 NOTE — ED Notes (Signed)
Pt presented to the lt hip onset  3 days ago worse when she moves. Pt has been using a cane to ambulate. Pt also stated went for a drive to [place]. Pt is NAD, denies any SOB, no chest pain.

## 2012-02-01 NOTE — Progress Notes (Signed)
VASCULAR LAB PRELIMINARY  PRELIMINARY  PRELIMINARY  PRELIMINARY  Left lower extremity venous duplex has been  completed.    Preliminary report: Left leg is negative for deep and superficial vein thrombosis.    Vanna Scotland,  RVT 02/01/2012, 1:48 PM

## 2012-02-01 NOTE — Discharge Instructions (Signed)
Arthralgia Your tests today showed no evidence of broken bone or blood clot.  Followup with your doctor this week.  Return to the ED if you develop new or worseninhg symptoms. Your caregiver has diagnosed you as suffering from an arthralgia. Arthralgia means there is pain in a joint. This can come from many reasons including:  Bruising the joint which causes soreness (inflammation) in the joint.   Wear and tear on the joints which occur as we grow older (osteoarthritis).   Overusing the joint.   Various forms of arthritis.   Infections of the joint.  Regardless of the cause of pain in your joint, most of these different pains respond to anti-inflammatory drugs and rest. The exception to this is when a joint is infected, and these cases are treated with antibiotics, if it is a bacterial infection. HOME CARE INSTRUCTIONS   Rest the injured area for as long as directed by your caregiver. Then slowly start using the joint as directed by your caregiver and as the pain allows. Crutches as directed may be useful if the ankles, knees or hips are involved. If the knee was splinted or casted, continue use and care as directed. If an stretchy or elastic wrapping bandage has been applied today, it should be removed and re-applied every 3 to 4 hours. It should not be applied tightly, but firmly enough to keep swelling down. Watch toes and feet for swelling, bluish discoloration, coldness, numbness or excessive pain. If any of these problems (symptoms) occur, remove the ace bandage and re-apply more loosely. If these symptoms persist, contact your caregiver or return to this location.   For the first 24 hours, keep the injured extremity elevated on pillows while lying down.   Apply ice for 15 to 20 minutes to the sore joint every couple hours while awake for the first half day. Then 3 to 4 times per day for the first 48 hours. Put the ice in a plastic bag and place a towel between the bag of ice and your skin.    Wear any splinting, casting, elastic bandage applications, or slings as instructed.   Only take over-the-counter or prescription medicines for pain, discomfort, or fever as directed by your caregiver. Do not use aspirin immediately after the injury unless instructed by your physician. Aspirin can cause increased bleeding and bruising of the tissues.   If you were given crutches, continue to use them as instructed and do not resume weight bearing on the sore joint until instructed.  Persistent pain and inability to use the sore joint as directed for more than 2 to 3 days are warning signs indicating that you should see a caregiver for a follow-up visit as soon as possible. Initially, a hairline fracture (break in bone) may not be evident on X-rays. Persistent pain and swelling indicate that further evaluation, non-weight bearing or use of the joint (use of crutches or slings as instructed), or further X-rays are indicated. X-rays may sometimes not show a small fracture until a week or 10 days later. Make a follow-up appointment with your own caregiver or one to whom we have referred you. A radiologist (specialist in reading X-rays) may read your X-rays. Make sure you know how you are to obtain your X-ray results. Do not assume everything is normal if you do not hear from [place]. SEEK MEDICAL CARE IF: Bruising, swelling, or pain increases. SEEK IMMEDIATE MEDICAL CARE IF:   Your fingers or toes are numb or blue.   The  pain is not responding to medications and continues to stay the same or get worse.   The pain in your joint becomes severe.   You develop a fever over 102 F (38.9 C).   It becomes impossible to move or use the joint.  MAKE SURE YOU:   Understand these instructions.   Will watch your condition.   Will get help right away if you are not doing well or get worse.  Document Released: 09/04/2005 Document Revised: 08/24/2011 Document Reviewed: 04/22/2008 [facility] Patient Information  2012 [place], [place].

## 2012-02-06 ENCOUNTER — Ambulatory Visit (INDEPENDENT_AMBULATORY_CARE_PROVIDER_SITE_OTHER): Payer: Medicare Other | Admitting: Family Medicine

## 2012-02-06 VITALS — BP 130/70 | HR 68 | Wt 275.0 lb

## 2012-02-06 DIAGNOSIS — M25559 Pain in unspecified hip: Secondary | ICD-10-CM

## 2012-02-06 DIAGNOSIS — M7062 Trochanteric bursitis, left hip: Secondary | ICD-10-CM

## 2012-02-06 NOTE — Patient Instructions (Addendum)
Use Tylenol for pain relief I gave you triamcinolone which is a steroid and Xylocaine

## 2012-02-06 NOTE — Progress Notes (Signed)
  Subjective:    Patient ID: Megan Perez, female    DOB: 12-08-1945, 66 y.o.   MRN: [id number]  HPI She is here for evaluation of left hip pain. This started on May 14. He is made worse with standing and walking. She states that she sometimes feels like she has to drag her left leg. She was seen in the emergency room. That record was reviewed.   Review of Systems     Objective:   Physical Exam Alert and in no distress. Good motion of the hip. Tender to palpation over the left greater trochanter. Strength is normal.       Assessment & Plan:   1. Greater trochanteric bursitis of left hip    I discussed options with her and we decided to have this injected. The point of maximum pain was identified. The area was prepped with Betadine. 40 mg of Kenalog and 3 cc of Xylocaine was injected into the left greater trochanter without difficulty. She tolerated the procedure well and did experience 50% relief of her symptoms relatively quickly.

## 2012-02-26 ENCOUNTER — Telehealth: Payer: Self-pay | Admitting: Family Medicine

## 2012-02-26 NOTE — Telephone Encounter (Signed)
LM

## 2012-04-08 ENCOUNTER — Ambulatory Visit (INDEPENDENT_AMBULATORY_CARE_PROVIDER_SITE_OTHER): Payer: Medicare Other | Admitting: Family Medicine

## 2012-04-08 ENCOUNTER — Encounter: Payer: Self-pay | Admitting: Family Medicine

## 2012-04-08 ENCOUNTER — Telehealth: Payer: Self-pay | Admitting: Internal Medicine

## 2012-04-08 VITALS — BP 122/80 | HR 76 | Wt 270.0 lb

## 2012-04-08 DIAGNOSIS — I10 Essential (primary) hypertension: Secondary | ICD-10-CM

## 2012-04-08 DIAGNOSIS — Z8639 Personal history of other endocrine, nutritional and metabolic disease: Secondary | ICD-10-CM

## 2012-04-08 DIAGNOSIS — Z862 Personal history of diseases of the blood and blood-forming organs and certain disorders involving the immune mechanism: Secondary | ICD-10-CM

## 2012-04-08 DIAGNOSIS — Z6841 Body Mass Index (BMI) 40.0 and over, adult: Secondary | ICD-10-CM

## 2012-04-08 DIAGNOSIS — K219 Gastro-esophageal reflux disease without esophagitis: Secondary | ICD-10-CM

## 2012-04-08 HISTORY — DX: Morbid (severe) obesity due to excess calories: E66.01

## 2012-04-08 LAB — CBC WITH DIFFERENTIAL/PLATELET
Basophils Absolute: 0 10*3/uL (ref 0.0–0.1)
Basophils Relative: 1 % (ref 0–1)
Eosinophils Absolute: 0.1 10*3/uL (ref 0.0–0.7)
Eosinophils Relative: 2 % (ref 0–5)
HCT: 34.5 % — ABNORMAL LOW (ref 36.0–46.0)
Hemoglobin: 11.7 g/dL — ABNORMAL LOW (ref 12.0–15.0)
Lymphocytes Relative: 41 % (ref 12–46)
Lymphs Abs: 2.2 10*3/uL (ref 0.7–4.0)
MCH: 30.1 pg (ref 26.0–34.0)
MCHC: 33.9 g/dL (ref 30.0–36.0)
MCV: 88.7 fL (ref 78.0–100.0)
Monocytes Absolute: 0.3 10*3/uL (ref 0.1–1.0)
Monocytes Relative: 5 % (ref 3–12)
Neutro Abs: 2.8 10*3/uL (ref 1.7–7.7)
Neutrophils Relative %: 51 % (ref 43–77)
Platelets: 336 10*3/uL (ref 150–400)
RBC: 3.89 MIL/uL (ref 3.87–5.11)
RDW: 15 % (ref 11.5–15.5)
WBC: 5.5 10*3/uL (ref 4.0–10.5)

## 2012-04-08 MED ORDER — OMEPRAZOLE 40 MG PO CPDR: 40.0000 mg | DELAYED_RELEASE_CAPSULE | Freq: Every day | ORAL | Status: DC

## 2012-04-08 MED ORDER — FUROSEMIDE 40 MG PO TABS: 40.0000 mg | ORAL_TABLET | Freq: Every day | ORAL | Status: DC

## 2012-04-08 NOTE — Telephone Encounter (Signed)
Sent med in 

## 2012-04-08 NOTE — Patient Instructions (Signed)
Take 2 of your 20 mg Prilosec centrally run out and then switched to the 40 mg. Use this for one or 2 weeks and if you're doing finding you can cut back to as needed. If it continues to bother you, set up another appointment.

## 2012-04-08 NOTE — Progress Notes (Signed)
  Subjective:    Patient ID: Megan Perez, female    DOB: Sep 04, 1946, 66 y.o.   MRN: [id number]  HPI She is here for evaluation of a several day history of increased symptoms of a hot feeling in the posterior aspect of her throat as well as abdominal burning and pain. She normally takes 20 mg of Prilosec but has not found this helpful. Review of her record also indicates low potassium as well as hemoglobin with the last blood draw. He does use Lasix for her blood pressure as well as for fluid retention. Review of Systems     Objective:   Physical Exam alert and in no distress. Tympanic membranes and canals are normal. Throat is clear. Tonsils are normal. Neck is supple without adenopathy or thyromegaly. Cardiac exam shows a regular sinus rhythm without murmurs or gallops. Lungs are clear to auscultation. Abdominal exam shows normal bowel sounds without masses or tenderness.        Assessment & Plan:   1. GERD  omeprazole (PRILOSEC) 40 MG capsule  2. Unspecified essential hypertension  furosemide (LASIX) 40 MG tablet  3. History of hypokalemia  Comprehensive metabolic panel  4. Morbid obesity with BMI of 40.0-44.9, adult    5. History of anemia  CBC with Differential   she will increase her Prilosec to 40 mg for the next one or 2 weeks. If continued difficulty, she is to call for further evaluation. Routine blood screening concerning her low potassium and slightly low hemoglobin.

## 2012-04-09 LAB — COMPREHENSIVE METABOLIC PANEL
ALT: 8 U/L (ref 0–35)
AST: 9 U/L (ref 0–37)
Albumin: 4.2 g/dL (ref 3.5–5.2)
Alkaline Phosphatase: 72 U/L (ref 39–117)
BUN: 18 mg/dL (ref 6–23)
CO2: 27 mEq/L (ref 19–32)
Calcium: 9.8 mg/dL (ref 8.4–10.5)
Chloride: 102 mEq/L (ref 96–112)
Creat: 0.98 mg/dL (ref 0.50–1.10)
Glucose, Bld: 78 mg/dL (ref 70–99)
Potassium: 4.7 mEq/L (ref 3.5–5.3)
Sodium: 140 mEq/L (ref 135–145)
Total Bilirubin: 0.2 mg/dL — ABNORMAL LOW (ref 0.3–1.2)
Total Protein: 6.8 g/dL (ref 6.0–8.3)

## 2012-06-24 ENCOUNTER — Telehealth: Payer: Self-pay | Admitting: Family Medicine

## 2012-06-24 ENCOUNTER — Other Ambulatory Visit: Payer: Self-pay

## 2012-06-24 DIAGNOSIS — Z1239 Encounter for other screening for malignant neoplasm of breast: Secondary | ICD-10-CM

## 2012-06-24 NOTE — Telephone Encounter (Signed)
Set this up for her

## 2012-06-24 NOTE — Telephone Encounter (Signed)
LOOKED IN PT CHART SHE HAD COLONOSCOPY IN 2008 NOT DUE TILL 2018 MAMMO WAS FAXED TO [facility] THEY WILL CALL HER TO SET UP

## 2012-06-28 ENCOUNTER — Other Ambulatory Visit (INDEPENDENT_AMBULATORY_CARE_PROVIDER_SITE_OTHER): Payer: Medicare Other

## 2012-06-28 DIAGNOSIS — Z23 Encounter for immunization: Secondary | ICD-10-CM

## 2012-06-28 MED ORDER — INFLUENZA VIRUS VACC SPLIT PF IM SUSP: 0.5000 mL | Freq: Once | INTRAMUSCULAR | Status: DC

## 2012-07-09 ENCOUNTER — Other Ambulatory Visit: Payer: Self-pay | Admitting: Family Medicine

## 2012-07-11 LAB — HM MAMMOGRAPHY: HM Mammogram: NEGATIVE

## 2012-12-17 ENCOUNTER — Other Ambulatory Visit: Payer: Self-pay | Admitting: Sports Medicine

## 2012-12-17 DIAGNOSIS — M25512 Pain in left shoulder: Secondary | ICD-10-CM

## 2012-12-22 ENCOUNTER — Ambulatory Visit
Admission: RE | Admit: 2012-12-22 | Discharge: 2012-12-22 | Disposition: A | Discharge status: Home / Self Care | Payer: Medicare Other | Source: Ambulatory Visit | Attending: Sports Medicine | Admitting: Sports Medicine

## 2012-12-22 DIAGNOSIS — M25512 Pain in left shoulder: Secondary | ICD-10-CM

## 2013-01-07 ENCOUNTER — Encounter: Payer: Self-pay | Admitting: Family Medicine

## 2013-01-07 ENCOUNTER — Ambulatory Visit (INDEPENDENT_AMBULATORY_CARE_PROVIDER_SITE_OTHER): Payer: Medicare Other | Admitting: Family Medicine

## 2013-01-07 VITALS — BP 140/80 | HR 82 | Wt 282.0 lb

## 2013-01-07 DIAGNOSIS — IMO0001 Reserved for inherently not codable concepts without codable children: Secondary | ICD-10-CM

## 2013-01-07 DIAGNOSIS — D649 Anemia, unspecified: Secondary | ICD-10-CM

## 2013-01-07 DIAGNOSIS — S46002S Unspecified injury of muscle(s) and tendon(s) of the rotator cuff of left shoulder, sequela: Secondary | ICD-10-CM

## 2013-01-07 DIAGNOSIS — Z6841 Body Mass Index (BMI) 40.0 and over, adult: Secondary | ICD-10-CM

## 2013-01-07 LAB — CBC WITH DIFFERENTIAL/PLATELET
Basophils Absolute: 0 10*3/uL (ref 0.0–0.1)
Basophils Relative: 1 % (ref 0–1)
Eosinophils Absolute: 0.1 10*3/uL (ref 0.0–0.7)
Eosinophils Relative: 3 % (ref 0–5)
HCT: 34.3 % — ABNORMAL LOW (ref 36.0–46.0)
Hemoglobin: 11.4 g/dL — ABNORMAL LOW (ref 12.0–15.0)
Lymphocytes Relative: 42 % (ref 12–46)
Lymphs Abs: 2 10*3/uL (ref 0.7–4.0)
MCH: 29.3 pg (ref 26.0–34.0)
MCHC: 33.2 g/dL (ref 30.0–36.0)
MCV: 88.2 fL (ref 78.0–100.0)
Monocytes Absolute: 0.3 10*3/uL (ref 0.1–1.0)
Monocytes Relative: 6 % (ref 3–12)
Neutro Abs: 2.3 10*3/uL (ref 1.7–7.7)
Neutrophils Relative %: 48 % (ref 43–77)
Platelets: 336 10*3/uL (ref 150–400)
RBC: 3.89 MIL/uL (ref 3.87–5.11)
RDW: 15.5 % (ref 11.5–15.5)
WBC: 4.7 10*3/uL (ref 4.0–10.5)

## 2013-01-07 NOTE — Progress Notes (Signed)
  Subjective:    Patient ID: Megan Perez, female    DOB: 06-10-1946, 67 y.o.   MRN: [id number]  HPI She is here for a preoperative evaluation. She is scheduled for left shoulder surgery for rotator cuff repair. She has no underlying cardiac or pulmonary symptoms. She does have a previous history of difficulty with slight anemia. She has had no chest pain, shortness of breath, PND or DOE.   Review of Systems     Objective:   Physical Exam alert and in no distress. Tympanic membranes and canals are normal. Throat is clear. Tonsils are normal. Neck is supple without adenopathy or thyromegaly. Cardiac exam shows a regular sinus rhythm without murmurs or gallops. Lungs are clear to auscultation.        Assessment & Plan:  Morbid obesity with BMI of 40.0-44.9, adult  Anemia - Plan: CBC with Differential  Rotator cuff injury, left, sequela

## 2013-01-08 ENCOUNTER — Telehealth: Payer: Self-pay | Admitting: Family Medicine

## 2013-01-08 NOTE — Telephone Encounter (Signed)
LM

## 2013-04-15 ENCOUNTER — Other Ambulatory Visit: Payer: Self-pay | Admitting: Family Medicine

## 2013-05-14 ENCOUNTER — Other Ambulatory Visit: Payer: Self-pay | Admitting: Family Medicine

## 2013-06-24 ENCOUNTER — Telehealth: Payer: Self-pay | Admitting: Family Medicine

## 2013-06-24 MED ORDER — FUROSEMIDE 40 MG PO TABS: ORAL_TABLET | ORAL | Status: DC

## 2013-06-24 NOTE — Telephone Encounter (Signed)
Lasix renewed.

## 2013-07-24 ENCOUNTER — Telehealth: Payer: Self-pay | Admitting: Family Medicine

## 2013-07-24 NOTE — Telephone Encounter (Signed)
LM

## 2013-08-06 ENCOUNTER — Ambulatory Visit (INDEPENDENT_AMBULATORY_CARE_PROVIDER_SITE_OTHER): Payer: Medicare Other | Admitting: Family Medicine

## 2013-08-06 ENCOUNTER — Encounter: Payer: Self-pay | Admitting: Family Medicine

## 2013-08-06 VITALS — BP 130/80 | HR 77 | Wt 249.0 lb

## 2013-08-06 DIAGNOSIS — I10 Essential (primary) hypertension: Secondary | ICD-10-CM

## 2013-08-06 DIAGNOSIS — Z719 Counseling, unspecified: Secondary | ICD-10-CM

## 2013-08-06 DIAGNOSIS — Z7189 Other specified counseling: Secondary | ICD-10-CM

## 2013-08-06 DIAGNOSIS — G479 Sleep disorder, unspecified: Secondary | ICD-10-CM

## 2013-08-06 DIAGNOSIS — Z6841 Body Mass Index (BMI) 40.0 and over, adult: Secondary | ICD-10-CM

## 2013-08-06 DIAGNOSIS — Z23 Encounter for immunization: Secondary | ICD-10-CM

## 2013-08-06 DIAGNOSIS — K219 Gastro-esophageal reflux disease without esophagitis: Secondary | ICD-10-CM

## 2013-08-06 DIAGNOSIS — M1990 Unspecified osteoarthritis, unspecified site: Secondary | ICD-10-CM

## 2013-08-06 DIAGNOSIS — R634 Abnormal weight loss: Secondary | ICD-10-CM

## 2013-08-06 LAB — CBC WITH DIFFERENTIAL/PLATELET
Basophils Absolute: 0 10*3/uL (ref 0.0–0.1)
Basophils Relative: 1 % (ref 0–1)
Eosinophils Absolute: 0 10*3/uL (ref 0.0–0.7)
Eosinophils Relative: 1 % (ref 0–5)
HCT: 37.3 % (ref 36.0–46.0)
Hemoglobin: 12.6 g/dL (ref 12.0–15.0)
Lymphocytes Relative: 36 % (ref 12–46)
Lymphs Abs: 2.1 10*3/uL (ref 0.7–4.0)
MCH: 29.9 pg (ref 26.0–34.0)
MCHC: 33.8 g/dL (ref 30.0–36.0)
MCV: 88.4 fL (ref 78.0–100.0)
Monocytes Absolute: 0.2 10*3/uL (ref 0.1–1.0)
Monocytes Relative: 4 % (ref 3–12)
Neutro Abs: 3.5 10*3/uL (ref 1.7–7.7)
Neutrophils Relative %: 58 % (ref 43–77)
Platelets: 344 10*3/uL (ref 150–400)
RBC: 4.22 MIL/uL (ref 3.87–5.11)
RDW: 15 % (ref 11.5–15.5)
WBC: 5.9 10*3/uL (ref 4.0–10.5)

## 2013-08-06 LAB — COMPREHENSIVE METABOLIC PANEL
ALT: <8 U/L (ref 0–35)
AST: 8 U/L (ref 0–37)
Albumin: 4.2 g/dL (ref 3.5–5.2)
Alkaline Phosphatase: 77 U/L (ref 39–117)
BUN: 21 mg/dL (ref 6–23)
CO2: 28 mEq/L (ref 19–32)
Calcium: 9.5 mg/dL (ref 8.4–10.5)
Chloride: 104 mEq/L (ref 96–112)
Creat: 0.71 mg/dL (ref 0.50–1.10)
Glucose, Bld: 95 mg/dL (ref 70–99)
Potassium: 4.2 mEq/L (ref 3.5–5.3)
Sodium: 142 mEq/L (ref 135–145)
Total Bilirubin: 0.3 mg/dL (ref 0.3–1.2)
Total Protein: 7.2 g/dL (ref 6.0–8.3)

## 2013-08-06 MED ORDER — TRIAZOLAM 0.25 MG PO TABS: 0.2500 mg | ORAL_TABLET | Freq: Every evening | ORAL | Status: DC | PRN

## 2013-08-06 NOTE — Progress Notes (Signed)
  Subjective:    Patient ID: Megan Perez, female    DOB: [birth year], 67 y.o.   MRN: [id number]  HPI She is here for medication check. Her husband died within the last 05-14-23. Her lifestyle has changed tremendously. She has changed her eating habits and is considering joining [employer]. She has lost over 30 pounds during this timeframe. She does feel quite lonely. She has not gotten involved in bereavement counseling yet. She has had difficulty with her sleep, stating she gets usually 2 hours at a time and this has been a problem since her husband died. She has had some shoulder discomfort and apparently did have the shoulder injected. She does see Dr. Thurston Hole for this. She does have reflux and uses Prilosec on an as-needed basis. She continues on her blood pressure medication.   Review of Systems     Objective:   Physical Exam alert and in no distress. Tympanic membranes and canals are normal. Throat is clear. Tonsils are normal. Neck is supple without adenopathy or thyromegaly. Cardiac exam shows a regular sinus rhythm without murmurs or gallops. Lungs are clear to auscultation.        Assessment & Plan:  Bereavement counseling  Need for prophylactic vaccination and inoculation against influenza - Plan: Flu vaccine HIGH DOSE PF (Fluzone Tri High dose)  Sleep disturbance - Plan: triazolam (HALCION) 0.25 MG tablet  Morbid obesity with BMI of 40.0-44.9, adult - Plan: Comprehensive metabolic panel, CBC with Differential  GERD  Unspecified essential hypertension - Plan: CANCELED: CBC with Differential, CANCELED: Comprehensive metabolic panel  OSTEOARTHROS UNSPEC GEN/LOC OTH SPEC SITES  Loss of weight - Plan: Comprehensive metabolic panel, CBC with Differential  will continue on her present medications. I will give her Halcion to be used sparingly. She apparently did not do well on Ambien. Strongly encouraged her to get involved in bereavement counseling. Also encouraged her to  continue with her weight reduction with dietary modification and going to [employer]. Over 30 minutes spent discussing all these issues with her. She is now living in [place]. I did give her the name of a family physician in the [place] area if she chooses to switch to someone closer to home.

## 2013-08-06 NOTE — Patient Instructions (Addendum)
Call Hospice and get involved in bereavement counseling Time to go to [employer] Dr Julieanne Manson

## 2013-08-07 ENCOUNTER — Telehealth: Payer: Self-pay | Admitting: Family Medicine

## 2013-08-07 ENCOUNTER — Other Ambulatory Visit: Payer: Self-pay

## 2013-08-07 NOTE — Telephone Encounter (Signed)
CALLED SLEEP [facility] IN

## 2013-08-07 NOTE — Progress Notes (Signed)
Quick Note:  MAILED PT LETTER OF LABS ______ 

## 2013-08-07 NOTE — Telephone Encounter (Signed)
DONE

## 2013-08-21 ENCOUNTER — Ambulatory Visit: Payer: Self-pay | Admitting: Orthopedic Surgery

## 2013-09-08 ENCOUNTER — Telehealth: Payer: Self-pay

## 2013-09-08 ENCOUNTER — Telehealth: Payer: Self-pay | Admitting: Family Medicine

## 2013-09-08 NOTE — Telephone Encounter (Signed)
Spoke with pt states she did not have a fracture it was her rotor cuff she said she had dexa done by a dr.tokunboh in [place] I will call tomorrow to see if we can get copy

## 2013-09-08 NOTE — Telephone Encounter (Signed)
Pt's insurance company called and stated that because this pt has broken a bone they wanted her to have a bone density test. They state that if she goes before the end of the year it will not cost her anything. They faxed over a form to be signed if it is appropriate for this pt. I am sending back the form on her chart. Please read and complete form then fax back to insurance company. Then schedule pt test.

## 2013-09-09 NOTE — Telephone Encounter (Signed)
I HAVE CALLED INS. COMPANY TALKED WITH LAURA EXPLAINED PT DID NOT HAVE A FRACTURE IT WAS A TORN ROT.CUFF SHE HAD BEEN SEEING DR.RAMOS FOR LAURA SAID SHE WILL FIX THE ISSUE IN THEIR SYSTEM AND APOLOGIZED FOR ALL THE PROBLEMS THEY HAD CAUSED

## 2013-09-22 ENCOUNTER — Ambulatory Visit (INDEPENDENT_AMBULATORY_CARE_PROVIDER_SITE_OTHER): Payer: Medicare HMO | Admitting: Family Medicine

## 2013-09-22 VITALS — BP 114/68 | HR 76 | Wt 256.0 lb

## 2013-09-22 DIAGNOSIS — IMO0001 Reserved for inherently not codable concepts without codable children: Secondary | ICD-10-CM

## 2013-09-22 DIAGNOSIS — Z6841 Body Mass Index (BMI) 40.0 and over, adult: Secondary | ICD-10-CM

## 2013-09-22 DIAGNOSIS — Z7189 Other specified counseling: Secondary | ICD-10-CM

## 2013-09-22 DIAGNOSIS — Z719 Counseling, unspecified: Secondary | ICD-10-CM

## 2013-09-22 DIAGNOSIS — S46002S Unspecified injury of muscle(s) and tendon(s) of the rotator cuff of left shoulder, sequela: Secondary | ICD-10-CM

## 2013-09-22 NOTE — Progress Notes (Signed)
   Subjective:    Patient ID: Megan Perez, female    DOB: October 26, 1945, 68 y.o.   MRN: [id number]  HPI She is here for consultation. She continues to have difficulty with rotator cuff pain that apparently resulted from a recent fall. She does not have a history of recurrent falls. He continues on Norco for this and this does cause difficulty with constipation. She is using Colace with good results. She did get involved in bereavement counseling and found this quite useful. She plans to followup with this. She has not started in an exercise program due to the holidays but plans to start it soon.   Review of Systems     Objective:   Physical Exam Alert and in no distress otherwise not examined       Assessment & Plan:  Bereavement counseling  Morbid obesity with BMI of 40.0-44.9, adult - Plan: MM Digital Screening  Rotator cuff injury, left, sequela  encouraged her to followup with bereavement counseling she plans to do. She will get involved in an exercise program. She will continue to be followed by her orthopedic surgeon for her shoulder. Routine mammogram was also ordered.

## 2013-10-02 ENCOUNTER — Ambulatory Visit: Payer: Self-pay | Admitting: Psychiatry

## 2013-10-17 ENCOUNTER — Telehealth: Payer: Self-pay | Admitting: Internal Medicine

## 2013-10-17 NOTE — Telephone Encounter (Signed)
Let her know that I cannot call anything in without seeing her. Have her take 4 Advil 3 times per day and see me beginning of the week

## 2013-10-17 NOTE — Telephone Encounter (Signed)
Pt called and states that Dr. Salvatore Marvelobert [facility] will not give her any pain med that she would have to call her primary doctor to get it. Pt is in a lot of pain. Can you call something in. Call into [facility] in [place]

## 2013-10-17 NOTE — Telephone Encounter (Signed)
Pt aware.

## 2013-10-20 ENCOUNTER — Other Ambulatory Visit: Payer: Self-pay | Admitting: Family Medicine

## 2013-12-04 ENCOUNTER — Other Ambulatory Visit: Payer: Self-pay | Admitting: Family Medicine

## 2014-01-21 ENCOUNTER — Encounter: Payer: Self-pay | Admitting: Family Medicine

## 2014-01-21 ENCOUNTER — Ambulatory Visit (INDEPENDENT_AMBULATORY_CARE_PROVIDER_SITE_OTHER): Payer: Medicare HMO | Admitting: Family Medicine

## 2014-01-21 VITALS — BP 126/74 | HR 76 | Temp 98.6°F | Ht 66.0 in | Wt 274.0 lb

## 2014-01-21 DIAGNOSIS — R51 Headache: Secondary | ICD-10-CM

## 2014-01-21 DIAGNOSIS — M79669 Pain in unspecified lower leg: Secondary | ICD-10-CM

## 2014-01-21 DIAGNOSIS — J309 Allergic rhinitis, unspecified: Secondary | ICD-10-CM

## 2014-01-21 DIAGNOSIS — M79609 Pain in unspecified limb: Secondary | ICD-10-CM

## 2014-01-21 DIAGNOSIS — Z79899 Other long term (current) drug therapy: Secondary | ICD-10-CM

## 2014-01-21 DIAGNOSIS — I10 Essential (primary) hypertension: Secondary | ICD-10-CM

## 2014-01-21 NOTE — Progress Notes (Signed)
Chief Complaint  Patient presents with  . Leg Pain    right leg pain that is "pulling" feel kind of like a charliehorse that won't go away.  Last night when she went to sleep, she woke up coughing and felt SOB, was a very scary incident being that she lives alone. Has a HA x 3 days as well.  Feels like her lasix is not working-she is still leg swelling.    She has had a headache for the last 3 days, at the top of her head, throbbing.  Pain comes and goes.  She has also been having runny nose, sniffling, watery eyes for the last 3 days.  She has postnasal drainage, and a slight cough all day long.  Nasal mucus and phlegm are clear.  She is having some sweats and then feels cold.  Hasn't checked her temperature.  She is also complaining of right leg pain, in posterior calf. It is worse when she lies down at night--she has to get up and sleep in a chair.  Described as a pulling sensation in her calf.  Doesn't bother her as much when she is upright, and not during the day.  No pain with walking.  Ankles are a little swollen more than usual (both ankles).  Denies any immobility, long car trip, recent surgery. Denies pain with activity.  Past Medical History  Diagnosis Date  . Hypertension   . Prediabetes   . OA (osteoarthritis)     knee   Past Surgical History  Procedure Laterality Date  . Knee surgery    . Abdominal hysterectomy    . Cholecystectomy    . Replacement total knee      left   History   Social History  . Marital Status: Married    Spouse Name: N/A    Number of Children: N/A  . Years of Education: N/A   Occupational History  . Not on file.   Social History Main Topics  . Smoking status: Never Smoker   . Smokeless tobacco: Never Used  . Alcohol Use: No  . Drug Use: No  . Sexual Activity: Yes    Birth Control/ Protection: Surgical   Other Topics Concern  . Not on file   Social History Narrative   Retired.  Lives alone   Outpatient Encounter Prescriptions as of  01/21/2014  Medication Sig Note  . aspirin 81 MG tablet Take 81 mg by mouth daily.     . furosemide (LASIX) 40 MG tablet TAKE 1 TABLET BY MOUTH EVERY DAY.   [name]. lisinopril (PRINIVIL,ZESTRIL) 20 MG tablet TAKE ONE TABLET BY MOUTH EVERY DAY   . meloxicam (MOBIC) 15 MG tablet Take 15 mg by mouth daily.   [name]. omeprazole (PRILOSEC) 40 MG capsule TAKE ONE CAPSULE BY MOUTH EVERY DAY   . traMADol (ULTRAM) 50 MG tablet Take 50 mg by mouth every 8 (eight) hours as needed. 01/21/2014: Uses prn, about 1-2x/week  . [DISCONTINUED] HYDROcodone-acetaminophen (NORCO/VICODIN) 5-325 MG per tablet Take 1 tablet by mouth every 6 (six) hours as needed for moderate pain.    Allergies  Allergen Reactions  . Prednisone Nausea And Vomiting   ROS:  Denies fever/chills, chest pain, shortness of breath, nausea, vomiting, bowel changes, urinary complaints.  +URI complaints. Denies bleeding/bruising, rash.  Denies muscle spasms.  PHYSICAL EXAM: BP 126/74  Pulse 76  Temp(Src) 98.6 F (37 C) (Oral)  Ht 5\' 6"  (1.676 m)  Wt 274 lb (124.286 kg)  BMI 44.25 kg/m2  SpO2 98% Sniffling and sneezing during visit. Pleasant female in no distress HEENT:  PERRL, EOMI, conjunctiva clear. Nasal mucosa moderately edematous, clear mucus.  OP is clear.  TM's and EACs normal. Neck: no lymphadenopathy, thyromegaly Heart: regular rate and rhythm, no murmur, rub, gallop Lungs: clear bilaterally Abdomen: soft, nontender Legs/ankles swollen, nonpitting, symmetric.  No cords, negative Homan 2+ pulse Psych: normal mood, hygiene, grooming, affect  ASSESSMENT/PLAN:  Calf pain - right.  no e/o DVT - Plan: Basic metabolic panel  Unspecified essential hypertension - well controlled  Encounter for long-term (current) use of other medications - Plan: Basic metabolic panel  Allergic rhinitis, cause unspecified  Headache(784.0)   Take an antihistamine such as loratidine 10mg  (claritin, alavert) or zyrtec (ceterizine 10mg ) or Allegra  (fexofenadine 180mg ).  Take these daily, if needed for alleriges.   Use guaifenesin (found in robitussin products and Mucinex) to help keep the mucus thin.  Ensure that you are drinking plenty of fluids.  Try some calf stretches as we discussed. Return for re-evaluation if increasing pain, swelling, or new symptoms develop.

## 2014-01-21 NOTE — Patient Instructions (Signed)
  Take an antihistamine such as loratidine 10mg  (claritin, alavert) or zyrtec (ceterizine 10mg ) or Allegra (fexofenadine 180mg ).  Take these daily, if needed for alleriges.   Use guaifenesin (found in robitussin products and Mucinex) to help keep the mucus thin.  Ensure that you are drinking plenty of fluids.  Try some calf stretches as we discussed. Return for re-evaluation if increasing pain, swelling, or new symptoms develop.

## 2014-01-22 LAB — BASIC METABOLIC PANEL
BUN: 13 mg/dL (ref 6–23)
CO2: 27 mEq/L (ref 19–32)
Calcium: 8.8 mg/dL (ref 8.4–10.5)
Chloride: 102 mEq/L (ref 96–112)
Creat: 0.99 mg/dL (ref 0.50–1.10)
Glucose, Bld: 85 mg/dL (ref 70–99)
Potassium: 3.9 mEq/L (ref 3.5–5.3)
Sodium: 140 mEq/L (ref 135–145)

## 2014-03-30 ENCOUNTER — Telehealth: Payer: Self-pay | Admitting: Family Medicine

## 2014-03-30 MED ORDER — FUROSEMIDE 40 MG PO TABS: ORAL_TABLET | ORAL | Status: DC

## 2014-03-30 NOTE — Telephone Encounter (Signed)
Needs refill Furosemide

## 2014-04-17 ENCOUNTER — Other Ambulatory Visit: Payer: Self-pay | Admitting: Family Medicine

## 2014-04-21 DIAGNOSIS — M75120 Complete rotator cuff tear or rupture of unspecified shoulder, not specified as traumatic: Secondary | ICD-10-CM | POA: Insufficient documentation

## 2014-04-21 DIAGNOSIS — M1710 Unilateral primary osteoarthritis, unspecified knee: Secondary | ICD-10-CM | POA: Insufficient documentation

## 2014-04-22 ENCOUNTER — Telehealth: Payer: Self-pay | Admitting: Family Medicine

## 2014-04-22 NOTE — Telephone Encounter (Signed)
Yes

## 2014-04-22 NOTE — Telephone Encounter (Signed)
Duplicate MRN's

## 2014-04-22 NOTE — Telephone Encounter (Signed)
Pt having total knee replacement on right leg and surgeon wants to know if she can stop Aspirin 10 days before surgery 06/10/14

## 2014-04-23 NOTE — Telephone Encounter (Signed)
No the pt just called & stated she was having surgery

## 2014-04-23 NOTE — Telephone Encounter (Signed)
LAURA DID YOU GET A CALL FROM [facility] IF SO WHO IS IT OR DID YOU GET A FAX

## 2014-04-23 NOTE — Telephone Encounter (Signed)
LEFT MESSAGE IT WAS FINE FOR HER TO COME OFF OF ASPRIN 10 DAYS PRIOR TO HER SURGERY IF THE SURGEON NEEDS ANY INFO TO PLEASE HAVE THEM FAX US OR CALL [place]

## 2014-06-25 ENCOUNTER — Telehealth: Payer: Self-pay | Admitting: Family Medicine

## 2014-06-29 ENCOUNTER — Telehealth: Payer: Self-pay | Admitting: Internal Medicine

## 2014-06-29 NOTE — Telephone Encounter (Signed)
LEFT MESSAGE TO CALL ME BACK

## 2014-06-29 NOTE — Telephone Encounter (Signed)
Ben with PT from [facility] called wanting a verbal order to begin PT twice a week for 3 weeks. Please call Romeo AppleBen back and let him know @ [phone]

## 2014-06-29 NOTE — Telephone Encounter (Signed)
Megan Perez called back and he was given a Research scientist (life sciences)Verbal Order

## 2014-06-29 NOTE — Telephone Encounter (Signed)
Set this up 

## 2014-07-08 NOTE — Telephone Encounter (Signed)
fyi

## 2014-08-20 ENCOUNTER — Other Ambulatory Visit: Payer: Self-pay | Admitting: Family Medicine

## 2014-09-01 ENCOUNTER — Ambulatory Visit (INDEPENDENT_AMBULATORY_CARE_PROVIDER_SITE_OTHER): Payer: Commercial Managed Care - HMO | Admitting: Family Medicine

## 2014-09-01 ENCOUNTER — Encounter: Payer: Self-pay | Admitting: Family Medicine

## 2014-09-01 VITALS — BP 130/82 | HR 81 | Wt 257.0 lb

## 2014-09-01 DIAGNOSIS — Z96651 Presence of right artificial knee joint: Secondary | ICD-10-CM

## 2014-09-01 DIAGNOSIS — D649 Anemia, unspecified: Secondary | ICD-10-CM

## 2014-09-01 DIAGNOSIS — I10 Essential (primary) hypertension: Secondary | ICD-10-CM

## 2014-09-01 DIAGNOSIS — M1990 Unspecified osteoarthritis, unspecified site: Secondary | ICD-10-CM

## 2014-09-01 DIAGNOSIS — Z6841 Body Mass Index (BMI) 40.0 and over, adult: Secondary | ICD-10-CM

## 2014-09-01 DIAGNOSIS — I872 Venous insufficiency (chronic) (peripheral): Secondary | ICD-10-CM

## 2014-09-01 DIAGNOSIS — K219 Gastro-esophageal reflux disease without esophagitis: Secondary | ICD-10-CM

## 2014-09-01 DIAGNOSIS — R7989 Other specified abnormal findings of blood chemistry: Secondary | ICD-10-CM

## 2014-09-01 DIAGNOSIS — R748 Abnormal levels of other serum enzymes: Secondary | ICD-10-CM

## 2014-09-01 LAB — CBC WITH DIFFERENTIAL/PLATELET
Basophils Absolute: 0 10*3/uL (ref 0.0–0.1)
Basophils Relative: 1 % (ref 0–1)
Eosinophils Absolute: 0 10*3/uL (ref 0.0–0.7)
Eosinophils Relative: 1 % (ref 0–5)
HCT: 34.5 % — ABNORMAL LOW (ref 36.0–46.0)
Hemoglobin: 11.5 g/dL — ABNORMAL LOW (ref 12.0–15.0)
Lymphocytes Relative: 36 % (ref 12–46)
Lymphs Abs: 1.6 10*3/uL (ref 0.7–4.0)
MCH: 29.7 pg (ref 26.0–34.0)
MCHC: 33.3 g/dL (ref 30.0–36.0)
MCV: 89.1 fL (ref 78.0–100.0)
MPV: 9.5 fL (ref 9.4–12.4)
Monocytes Absolute: 0.3 10*3/uL (ref 0.1–1.0)
Monocytes Relative: 6 % (ref 3–12)
Neutro Abs: 2.5 10*3/uL (ref 1.7–7.7)
Neutrophils Relative %: 56 % (ref 43–77)
Platelets: 340 10*3/uL (ref 150–400)
RBC: 3.87 MIL/uL (ref 3.87–5.11)
RDW: 14.6 % (ref 11.5–15.5)
WBC: 4.4 10*3/uL (ref 4.0–10.5)

## 2014-09-01 LAB — COMPREHENSIVE METABOLIC PANEL
ALT: <8 U/L (ref 0–35)
AST: 11 U/L (ref 0–37)
Albumin: 4 g/dL (ref 3.5–5.2)
Alkaline Phosphatase: 66 U/L (ref 39–117)
BUN: 9 mg/dL (ref 6–23)
CO2: 28 mEq/L (ref 19–32)
Calcium: 9.4 mg/dL (ref 8.4–10.5)
Chloride: 105 mEq/L (ref 96–112)
Creat: 0.57 mg/dL (ref 0.50–1.10)
Glucose, Bld: 89 mg/dL (ref 70–99)
Potassium: 4.3 mEq/L (ref 3.5–5.3)
Sodium: 142 mEq/L (ref 135–145)
Total Bilirubin: 0.3 mg/dL (ref 0.2–1.2)
Total Protein: 6.8 g/dL (ref 6.0–8.3)

## 2014-09-01 NOTE — Patient Instructions (Signed)
You can take 2 Tylenol 4 times per day if you need to. See if you can take the Tylenol and not use the codeine

## 2014-09-01 NOTE — Progress Notes (Signed)
   Subjective:    Patient ID: Megan Perez, female    DOB: [birth year], 68 y.o.   MRN: [id number]  HPI She is here for recheck. She did have right TKR on September 23 by an orthopedic surgeon in [place]. She has been doing fairly well since then. She presently still taking Tylenol and codeine to help with her knee pain. She states she is doing her exercises. Review of that record did show an anemia as well as elevated BUN and creatinine. She has not had follow-up since then. She does have reflux and does use her PPI on an as-needed basis. She has been exercising and her weight was reviewed. She continues to complain of swelling and was told by her surgeon should go down with time. Her immunizations and health maintenance was reviewed. She has had a flu shot already.  Review of Systems     Objective:   Physical Exam alert and in no distress. Tympanic membranes and canals are normal. Throat is clear. Tonsils are normal. Neck is supple without adenopathy or thyromegaly. Cardiac exam shows a regular sinus rhythm without murmurs or gallops. Lungs are clear to auscultation. 2+ pitting edema noted in the lower extremities       Assessment & Plan:  Arthritis  Gastroesophageal reflux disease without esophagitis  Morbid obesity with BMI of 40.0-44.9, adult  Essential hypertension - Plan: CBC with Differential, Comprehensive metabolic panel  Venous (peripheral) insufficiency  Anemia, unspecified anemia type - Plan: CBC with Differential  Elevated serum creatinine - Plan: Comprehensive metabolic panel  Status post total right knee replacement  encouraged her to continue with physical activity as well as strengthening exercises. We'll follow-up on her blood work especially in regard to her anemia and potential renal involvement which was probably secondary to her surgery.

## 2014-09-28 ENCOUNTER — Other Ambulatory Visit: Payer: Self-pay | Admitting: Family Medicine

## 2014-10-22 ENCOUNTER — Other Ambulatory Visit: Payer: Self-pay | Admitting: Family Medicine

## 2014-10-22 ENCOUNTER — Telehealth: Payer: Self-pay | Admitting: Family Medicine

## 2014-10-22 NOTE — Telephone Encounter (Signed)
Go ahead and refer 

## 2014-10-22 NOTE — Telephone Encounter (Signed)
Requesting referral to an ENT doctor in [place] at phone # [phone]. Pt says she hears something running like water in her ear. She wants to see an ENT not a PCP when I offered an appointment here. Pt says she was not able to make the appt herself because insurance requires a referral.

## 2014-10-23 NOTE — Telephone Encounter (Signed)
I have sent request to [employer]

## 2014-10-26 ENCOUNTER — Telehealth: Payer: Self-pay | Admitting: Family Medicine

## 2014-10-26 NOTE — Telephone Encounter (Signed)
Patient has been called and gave appointment waiting on [employer] okay

## 2014-10-26 NOTE — Telephone Encounter (Signed)
Pt called and wanted status on referral.  Please call pt and advise [phone]

## 2014-11-05 DIAGNOSIS — J301 Allergic rhinitis due to pollen: Secondary | ICD-10-CM | POA: Diagnosis not present

## 2014-11-05 DIAGNOSIS — H6980 Other specified disorders of Eustachian tube, unspecified ear: Secondary | ICD-10-CM | POA: Diagnosis not present

## 2014-11-20 ENCOUNTER — Ambulatory Visit: Payer: Self-pay | Admitting: Psychiatry

## 2014-11-20 DIAGNOSIS — Z1231 Encounter for screening mammogram for malignant neoplasm of breast: Secondary | ICD-10-CM | POA: Diagnosis not present

## 2014-12-10 ENCOUNTER — Other Ambulatory Visit (INDEPENDENT_AMBULATORY_CARE_PROVIDER_SITE_OTHER): Payer: Commercial Managed Care - HMO

## 2014-12-10 ENCOUNTER — Telehealth: Payer: Self-pay | Admitting: Family Medicine

## 2014-12-10 DIAGNOSIS — Z23 Encounter for immunization: Secondary | ICD-10-CM

## 2014-12-10 NOTE — Telephone Encounter (Signed)
Pt brought disability parking placard form by for Dr Susann GivensLalonde to complete, Pt requested that the completed form be mailed to her home address.

## 2014-12-12 NOTE — Telephone Encounter (Signed)
Have we fillied out one for her in the past? Not sure why she wants this.

## 2014-12-14 NOTE — Telephone Encounter (Signed)
Called patient she said she has had one for years and years due to she cant walk for due to her breathing she said Dr.Johnson filled out her last one

## 2015-02-23 ENCOUNTER — Encounter: Payer: Self-pay | Admitting: Medical

## 2015-02-23 ENCOUNTER — Ambulatory Visit (INDEPENDENT_AMBULATORY_CARE_PROVIDER_SITE_OTHER): Payer: Commercial Managed Care - HMO | Admitting: Medical

## 2015-02-23 VITALS — BP 120/80 | HR 96 | Temp 98.2°F | Resp 14 | Wt 268.0 lb

## 2015-02-23 DIAGNOSIS — J209 Acute bronchitis, unspecified: Secondary | ICD-10-CM

## 2015-02-23 MED ORDER — AZITHROMYCIN 250 MG PO TABS: ORAL_TABLET | ORAL | Status: DC

## 2015-02-23 MED ORDER — ALBUTEROL SULFATE HFA 108 (90 BASE) MCG/ACT IN AERS: 2.0000 | INHALATION_SPRAY | Freq: Four times a day (QID) | RESPIRATORY_TRACT | Status: DC | PRN

## 2015-02-23 NOTE — Progress Notes (Signed)
Subjective:  [name] is a 69 y.o. female who presents for illness. Symptoms include 2 week hx/o worsening head and chest cold with cough, hurting in chest from so much coughing, productive cough, lots of phlegm, wheezing at times, difficult to breath at times, pressure in head and eats, some sore throat.  The last time she felt like this had pneumonia few years ago.   Denies fever, chills, NVD, edema, palpitations.  Using some alka seltzer.  Nonsmoker, no sick contacts.  No recent weight changes . No paroxysmal nocturnal dyspnea.  No other aggravating or relieving factors.  No other c/o.  The following portions of the patient's history were reviewed and updated as appropriate: allergies, current medications, past family history, past medical history, past social history, past surgical history and problem list.  ROS as in subjective  Past Medical History  Diagnosis Date  . Hypertension   . Prediabetes   . OA (osteoarthritis)     knee     Objective: BP 120/80 mmHg  Pulse 96  Temp(Src) 98.2 F (36.8 C) (Oral)  Resp 14  Wt 268 lb (121.564 kg)   General appearance: Alert, WD/WN, no distress, ill appearing                             Skin: warm, no rash, no diaphoresis                           Head: no sinus tenderness                            Eyes: conjunctiva normal, corneas clear, PERRLA                            Ears: pearly TMs, external ear canals normal                          Nose: septum midline, turbinates swollen, with erythema and clear discharge             Mouth/throat: MMM, tongue normal, mild pharyngeal erythema                           Neck: supple, no adenopathy, no thyromegaly, nontender                          Heart: RRR, normal S1, S2, no murmurs                         Lungs: +bronchial breath sounds, +scattered rhonchi, no wheezes, no rales                Extremities: 1+ nonpitting edema unchanged per patient, nontender     Assessment: Encounter  Diagnosis  Name Primary?  . Acute bronchitis, unspecified organism Yes     Plan:  Medication orders today include: [name] was seen today for cough.  Diagnoses and all orders for this visit:  Acute bronchitis, unspecified organism  Other orders -     azithromycin (ZITHROMAX) 250 MG tablet; 2 tablets day 1, then 1 tablet days 2-4 -     albuterol (PROVENTIL HFA;VENTOLIN HFA) 108 (90 BASE) MCG/ACT inhaler; Inhale 2 puffs into the lungs  every 6 (six) hours as needed for wheezing or shortness of breath.   Discussed diagnosis and treatment of bronchitis.  Suggested symptomatic OTC remedies for cough and congestion.  Tylenol or Ibuprofen OTC for fever and malaise.  Call/return in 2-3 days if symptoms are worse or not improving.  Advised that cough may linger even after the infection is improved.

## 2015-02-23 NOTE — Patient Instructions (Signed)
Recommendations:  Rest  Drink water throughout the day  Begin Zpak 5 day antibiotic  Use Alka Seltzer or Mucinex DM  Stay of the heat  Begin albuterol inhaler, 2 puffs every 6 hours for cough, shortness of breath or wheezing  If not seeing big improvement within 4-5 days, then call or return  If gaining weight, swelling in the leg, fever, chest pain, or worse shortness of breath, then return right away

## 2015-04-27 DIAGNOSIS — H5203 Hypermetropia, bilateral: Secondary | ICD-10-CM | POA: Diagnosis not present

## 2015-04-27 DIAGNOSIS — H5210 Myopia, unspecified eye: Secondary | ICD-10-CM | POA: Diagnosis not present

## 2015-04-27 DIAGNOSIS — H35039 Hypertensive retinopathy, unspecified eye: Secondary | ICD-10-CM | POA: Diagnosis not present

## 2015-04-27 DIAGNOSIS — H2513 Age-related nuclear cataract, bilateral: Secondary | ICD-10-CM | POA: Diagnosis not present

## 2015-05-20 DIAGNOSIS — Z471 Aftercare following joint replacement surgery: Secondary | ICD-10-CM | POA: Diagnosis not present

## 2015-05-20 DIAGNOSIS — Z96651 Presence of right artificial knee joint: Secondary | ICD-10-CM | POA: Diagnosis not present

## 2015-07-26 ENCOUNTER — Telehealth: Payer: Self-pay | Admitting: Family Medicine

## 2015-07-26 NOTE — Telephone Encounter (Signed)
Pt states has appt with Dr. Deeann SaintHoward Miller in [place] on Monday for her shoulder and has [employer] ins and needs referral

## 2015-07-28 NOTE — Telephone Encounter (Signed)
[facility] called stating that pt has appt for Right shoulder pain on Monday and will be seeing Dr. Deeann SaintHoward Miller. They will need a referral.

## 2015-07-29 NOTE — Telephone Encounter (Signed)
The referral has been done on the 8th auth# [id number] and a copy was faxed to there office 07/27/15 also

## 2015-08-02 DIAGNOSIS — M7540 Impingement syndrome of unspecified shoulder: Secondary | ICD-10-CM | POA: Insufficient documentation

## 2015-08-02 DIAGNOSIS — M7541 Impingement syndrome of right shoulder: Secondary | ICD-10-CM | POA: Diagnosis not present

## 2015-08-05 ENCOUNTER — Other Ambulatory Visit: Payer: Self-pay | Admitting: Specialist

## 2015-08-05 DIAGNOSIS — M7541 Impingement syndrome of right shoulder: Secondary | ICD-10-CM

## 2015-08-11 ENCOUNTER — Other Ambulatory Visit: Payer: Self-pay | Admitting: Family Medicine

## 2015-08-26 ENCOUNTER — Ambulatory Visit
Admission: RE | Admit: 2015-08-26 | Discharge: 2015-08-26 | Disposition: A | Discharge status: Home / Self Care | Payer: Commercial Managed Care - HMO | Source: Ambulatory Visit | Attending: Specialist | Admitting: Specialist

## 2015-08-26 DIAGNOSIS — M7541 Impingement syndrome of right shoulder: Secondary | ICD-10-CM | POA: Diagnosis not present

## 2015-08-26 DIAGNOSIS — M75121 Complete rotator cuff tear or rupture of right shoulder, not specified as traumatic: Secondary | ICD-10-CM | POA: Insufficient documentation

## 2015-08-26 DIAGNOSIS — M6259 Muscle wasting and atrophy, not elsewhere classified, multiple sites: Secondary | ICD-10-CM | POA: Diagnosis not present

## 2015-08-31 DIAGNOSIS — M75120 Complete rotator cuff tear or rupture of unspecified shoulder, not specified as traumatic: Secondary | ICD-10-CM | POA: Insufficient documentation

## 2015-08-31 DIAGNOSIS — M75121 Complete rotator cuff tear or rupture of right shoulder, not specified as traumatic: Secondary | ICD-10-CM | POA: Diagnosis not present

## 2015-09-03 ENCOUNTER — Inpatient Hospital Stay: Admission: RE | Admit: 2015-09-03 | Payer: Medicare Other | Source: Ambulatory Visit

## 2015-09-03 ENCOUNTER — Encounter: Payer: Self-pay | Admitting: *Deleted

## 2015-09-03 NOTE — Patient Instructions (Addendum)
  Your procedure is scheduled on: 09-15-15 [facility](WEDNESDAY) Report to [facility] SAME DAY SURGERY 2ND FLOOR To find out your arrival time please call [phone] between 1PM - 3PM on 08-2715 (TUESDAY)  Remember: Instructions that are not followed completely may result in serious medical risk, up to and including death, or upon the discretion of your surgeon and anesthesiologist your surgery may need to be rescheduled.    _X___ 1. Do not eat food or drink liquids after midnight. No gum chewing or hard candies.     _X___ 2. No Alcohol for 24 hours before or after surgery.   ____ 3. Bring all medications with you on the day of surgery if instructed.    _X___ 4. Notify your doctor if there is any change in your medical condition     (cold, fever, infections).     Do not wear jewelry, make-up, hairpins, clips or nail polish.  Do not wear lotions, powders, or perfumes. You may wear deodorant.  Do not shave 48 hours prior to surgery. Men may shave face and neck.  Do not bring valuables to the hospital.    [facility] is not responsible for any belongings or valuables.               Contacts, dentures or bridgework may not be worn into surgery.  Leave your suitcase in the car. After surgery it may be brought to your room.  For patients admitted to the hospital, discharge time is determined by your treatment team.   Patients discharged the day of surgery will not be allowed to drive home.   Please read over the following fact sheets that you were given:      _X___ Take these medicines the morning of surgery with A SIP OF WATER:    1. LISINOPRIL  2. PRILOSEC  3. TAKE AN EXTRA PRILOSEC ON Tuesday NIGHT  4.  5.  6.  ____ Fleet Enema (as directed)   _X___ Use CHG Soap as directed  _X___ Use inhalers on the day of surgery-BRING ALBUTEROL TO HOSPITAL   ____ Stop metformin 2 days prior to surgery    ____ Take 1/2 of usual insulin dose the night before surgery and none on the morning of  surgery.   ____ Stop Coumadin/Plavix/aspirin-STOP ASPIRIN 7 DAYS PRIOR TO SURGERY  ____ Stop Anti-inflammatories-STOP MELOXICAM 7 DAYS PRIOR TO SURGERY-NO NSAIDS OR ASPIRIN PRODUCTS-TYLENOL OK   ____ Stop supplements until after surgery.    ____ Bring C-Pap to the hospital.

## 2015-09-07 ENCOUNTER — Ambulatory Visit (INDEPENDENT_AMBULATORY_CARE_PROVIDER_SITE_OTHER): Payer: Commercial Managed Care - HMO | Admitting: Family Medicine

## 2015-09-07 ENCOUNTER — Telehealth: Payer: Self-pay | Admitting: Family Medicine

## 2015-09-07 ENCOUNTER — Encounter: Payer: Self-pay | Admitting: Family Medicine

## 2015-09-07 VITALS — BP 128/80 | HR 76 | Resp 12 | Wt 251.8 lb

## 2015-09-07 DIAGNOSIS — J302 Other seasonal allergic rhinitis: Secondary | ICD-10-CM

## 2015-09-07 DIAGNOSIS — I10 Essential (primary) hypertension: Secondary | ICD-10-CM

## 2015-09-07 DIAGNOSIS — Z6841 Body Mass Index (BMI) 40.0 and over, adult: Secondary | ICD-10-CM | POA: Diagnosis not present

## 2015-09-07 DIAGNOSIS — M1990 Unspecified osteoarthritis, unspecified site: Secondary | ICD-10-CM

## 2015-09-07 DIAGNOSIS — K219 Gastro-esophageal reflux disease without esophagitis: Secondary | ICD-10-CM | POA: Diagnosis not present

## 2015-09-07 DIAGNOSIS — M129 Arthropathy, unspecified: Secondary | ICD-10-CM | POA: Diagnosis not present

## 2015-09-07 DIAGNOSIS — Z96651 Presence of right artificial knee joint: Secondary | ICD-10-CM | POA: Diagnosis not present

## 2015-09-07 LAB — CBC WITH DIFFERENTIAL/PLATELET
Basophils Absolute: 0.1 10*3/uL (ref 0.0–0.1)
Basophils Relative: 1 % (ref 0–1)
Eosinophils Absolute: 0.1 10*3/uL (ref 0.0–0.7)
Eosinophils Relative: 2 % (ref 0–5)
HCT: 37.7 % (ref 36.0–46.0)
Hemoglobin: 12.4 g/dL (ref 12.0–15.0)
Lymphocytes Relative: 42 % (ref 12–46)
Lymphs Abs: 2.5 10*3/uL (ref 0.7–4.0)
MCH: 29.9 pg (ref 26.0–34.0)
MCHC: 32.9 g/dL (ref 30.0–36.0)
MCV: 90.8 fL (ref 78.0–100.0)
MPV: 10 fL (ref 8.6–12.4)
Monocytes Absolute: 0.3 10*3/uL (ref 0.1–1.0)
Monocytes Relative: 5 % (ref 3–12)
Neutro Abs: 3 10*3/uL (ref 1.7–7.7)
Neutrophils Relative %: 50 % (ref 43–77)
Platelets: 351 10*3/uL (ref 150–400)
RBC: 4.15 MIL/uL (ref 3.87–5.11)
RDW: 14.2 % (ref 11.5–15.5)
WBC: 6 10*3/uL (ref 4.0–10.5)

## 2015-09-07 LAB — COMPREHENSIVE METABOLIC PANEL
ALT: 8 U/L (ref 6–29)
AST: 11 U/L (ref 10–35)
Albumin: 4.2 g/dL (ref 3.6–5.1)
Alkaline Phosphatase: 79 U/L (ref 33–130)
BUN: 16 mg/dL (ref 7–25)
CO2: 27 mmol/L (ref 20–31)
Calcium: 9.1 mg/dL (ref 8.6–10.4)
Chloride: 101 mmol/L (ref 98–110)
Creat: 0.75 mg/dL (ref 0.60–0.93)
Glucose, Bld: 84 mg/dL (ref 65–99)
Potassium: 4.1 mmol/L (ref 3.5–5.3)
Sodium: 141 mmol/L (ref 135–146)
Total Bilirubin: 0.3 mg/dL (ref 0.2–1.2)
Total Protein: 7 g/dL (ref 6.1–8.1)

## 2015-09-07 MED ORDER — OMEPRAZOLE 40 MG PO CPDR: 40.0000 mg | DELAYED_RELEASE_CAPSULE | Freq: Every day | ORAL | Status: DC

## 2015-09-07 MED ORDER — LISINOPRIL 20 MG PO TABS: 20.0000 mg | ORAL_TABLET | Freq: Every day | ORAL | Status: DC

## 2015-09-07 NOTE — Telephone Encounter (Signed)
Recv'd surgery clearance from [facility], put in your folder

## 2015-09-07 NOTE — Progress Notes (Signed)
   Subjective:    Patient ID: Megan Perez, female    DOB: July 01, 1946, 69 y.o.   MRN: [id number]  HPI She is here for a preoperative evaluation. She has not been seen in over a year and [name] I will therefore do a general med check on her. She does have hypertension and continues on lisinopril without difficulty. She also has difficulty with generalized arthritis and has had a knee replacement. She is scheduled for right shoulder repair due to some tendon damage. She is on meloxicam for this. She does have underlying allergies and asthma but rarely uses her inhaler stating usually every 3 or 4 months. She has lost several pounds due to change in her lifestyle. She is now dating someone and seems to be quite happy. She has very little difficulty with reflux and does use Prilosec with good results for this. She has no other concerns or complaints.   Review of Systems     Objective:   Physical Exam Alert and in no distress. Tympanic membranes and canals are normal. Pharyngeal area is normal. Neck is supple without adenopathy or thyromegaly. Cardiac exam shows a regular sinus rhythm without murmurs or gallops. Lungs are clear to auscultation.       Assessment & Plan:  Other seasonal allergic rhinitis  Arthritis - Plan: CBC with Differential/Platelet, Comprehensive metabolic panel  Essential hypertension - Plan: CBC with Differential/Platelet, Comprehensive metabolic panel, lisinopril (PRINIVIL,ZESTRIL) 20 MG tablet  Gastroesophageal reflux disease without esophagitis - Plan: omeprazole (PRILOSEC) 40 MG capsule  Morbid obesity with BMI of 40.0-44.9, adult (HCC)  Status post total right knee replacement  graduated her on her weight loss. Discussed proper rehabilitation after the surgery to ensure that her shoulder that are very quickly. Continue on present medications.

## 2015-09-09 ENCOUNTER — Encounter
Admission: RE | Admit: 2015-09-09 | Discharge: 2015-09-09 | Disposition: A | Discharge status: Home / Self Care | Payer: Commercial Managed Care - HMO | Source: Ambulatory Visit | Attending: Specialist | Admitting: Specialist

## 2015-09-09 DIAGNOSIS — Z01810 Encounter for preprocedural cardiovascular examination: Secondary | ICD-10-CM | POA: Insufficient documentation

## 2015-09-10 ENCOUNTER — Encounter: Payer: Self-pay | Admitting: *Deleted

## 2015-09-15 ENCOUNTER — Ambulatory Visit: Payer: Commercial Managed Care - HMO | Admitting: Anesthesiology

## 2015-09-15 ENCOUNTER — Encounter: Payer: Self-pay | Admitting: *Deleted

## 2015-09-15 ENCOUNTER — Ambulatory Visit
Admission: RE | Admit: 2015-09-15 | Discharge: 2015-09-15 | Disposition: A | Discharge status: Home / Self Care | Payer: Commercial Managed Care - HMO | Source: Ambulatory Visit | Attending: Specialist | Admitting: Specialist

## 2015-09-15 ENCOUNTER — Encounter
Admission: RE | Disposition: A | Discharge status: Home / Self Care | Payer: Self-pay | Source: Ambulatory Visit | Attending: Specialist

## 2015-09-15 DIAGNOSIS — Z6841 Body Mass Index (BMI) 40.0 and over, adult: Secondary | ICD-10-CM | POA: Insufficient documentation

## 2015-09-15 DIAGNOSIS — M25511 Pain in right shoulder: Secondary | ICD-10-CM | POA: Diagnosis not present

## 2015-09-15 DIAGNOSIS — R7303 Prediabetes: Secondary | ICD-10-CM | POA: Diagnosis not present

## 2015-09-15 DIAGNOSIS — S43421A Sprain of right rotator cuff capsule, initial encounter: Secondary | ICD-10-CM | POA: Diagnosis not present

## 2015-09-15 DIAGNOSIS — F419 Anxiety disorder, unspecified: Secondary | ICD-10-CM | POA: Diagnosis not present

## 2015-09-15 DIAGNOSIS — M19011 Primary osteoarthritis, right shoulder: Secondary | ICD-10-CM | POA: Diagnosis not present

## 2015-09-15 DIAGNOSIS — M1990 Unspecified osteoarthritis, unspecified site: Secondary | ICD-10-CM | POA: Insufficient documentation

## 2015-09-15 DIAGNOSIS — M13811 Other specified arthritis, right shoulder: Secondary | ICD-10-CM | POA: Diagnosis not present

## 2015-09-15 DIAGNOSIS — I872 Venous insufficiency (chronic) (peripheral): Secondary | ICD-10-CM | POA: Diagnosis not present

## 2015-09-15 DIAGNOSIS — K219 Gastro-esophageal reflux disease without esophagitis: Secondary | ICD-10-CM | POA: Insufficient documentation

## 2015-09-15 DIAGNOSIS — Z96651 Presence of right artificial knee joint: Secondary | ICD-10-CM | POA: Insufficient documentation

## 2015-09-15 DIAGNOSIS — Z90710 Acquired absence of both cervix and uterus: Secondary | ICD-10-CM | POA: Insufficient documentation

## 2015-09-15 DIAGNOSIS — M7541 Impingement syndrome of right shoulder: Secondary | ICD-10-CM | POA: Insufficient documentation

## 2015-09-15 DIAGNOSIS — Z79899 Other long term (current) drug therapy: Secondary | ICD-10-CM | POA: Diagnosis not present

## 2015-09-15 DIAGNOSIS — M75121 Complete rotator cuff tear or rupture of right shoulder, not specified as traumatic: Secondary | ICD-10-CM | POA: Diagnosis not present

## 2015-09-15 DIAGNOSIS — Z5333 Arthroscopic surgical procedure converted to open procedure: Secondary | ICD-10-CM | POA: Diagnosis not present

## 2015-09-15 DIAGNOSIS — Z7982 Long term (current) use of aspirin: Secondary | ICD-10-CM | POA: Insufficient documentation

## 2015-09-15 DIAGNOSIS — I10 Essential (primary) hypertension: Secondary | ICD-10-CM | POA: Diagnosis not present

## 2015-09-15 DIAGNOSIS — G8918 Other acute postprocedural pain: Secondary | ICD-10-CM | POA: Diagnosis not present

## 2015-09-15 HISTORY — DX: Anxiety disorder, unspecified: F41.9

## 2015-09-15 HISTORY — PX: SHOULDER ARTHROSCOPY WITH OPEN ROTATOR CUFF REPAIR: SHX6092

## 2015-09-15 HISTORY — DX: Gastro-esophageal reflux disease without esophagitis: K21.9

## 2015-09-15 HISTORY — DX: Bronchitis, not specified as acute or chronic: J40

## 2015-09-15 LAB — GLUCOSE, CAPILLARY: Glucose-Capillary: 100 mg/dL — ABNORMAL HIGH (ref 65–99)

## 2015-09-15 SURGERY — ARTHROSCOPY, SHOULDER WITH REPAIR, ROTATOR CUFF, OPEN
Anesthesia: General | Site: Shoulder | Laterality: Right | Wound class: Clean

## 2015-09-15 MED ORDER — LACTATED RINGERS IV SOLN
INTRAVENOUS | Status: DC | PRN
  Administered 2015-09-15 (×2): via INTRAVENOUS

## 2015-09-15 MED ORDER — MORPHINE SULFATE (PF) 4 MG/ML IV SOLN
INTRAVENOUS | Status: AC
  Filled 2015-09-15: qty 1

## 2015-09-15 MED ORDER — BUPIVACAINE-EPINEPHRINE (PF) 0.25% -1:200000 IJ SOLN
INTRAMUSCULAR | Status: DC | PRN
  Administered 2015-09-15: 40 mL via PERINEURAL

## 2015-09-15 MED ORDER — EPINEPHRINE HCL 1 MG/ML IJ SOLN
INTRAMUSCULAR | Status: DC | PRN
  Administered 2015-09-15: 8 mg

## 2015-09-15 MED ORDER — DEXAMETHASONE SODIUM PHOSPHATE 10 MG/ML IJ SOLN
INTRAMUSCULAR | Status: DC | PRN
  Administered 2015-09-15: 10 mg via INTRAVENOUS

## 2015-09-15 MED ORDER — ROPIVACAINE HCL 2 MG/ML IJ SOLN
INTRAMUSCULAR | Status: AC
  Filled 2015-09-15: qty 40

## 2015-09-15 MED ORDER — FENTANYL CITRATE (PF) 100 MCG/2ML IJ SOLN
INTRAMUSCULAR | Status: AC
  Filled 2015-09-15: qty 2

## 2015-09-15 MED ORDER — EPINEPHRINE HCL 1 MG/ML IJ SOLN
INTRAMUSCULAR | Status: AC
  Filled 2015-09-15: qty 1

## 2015-09-15 MED ORDER — MIDAZOLAM HCL 5 MG/5ML IJ SOLN
INTRAMUSCULAR | Status: AC
  Filled 2015-09-15: qty 5

## 2015-09-15 MED ORDER — ROCURONIUM BROMIDE 100 MG/10ML IV SOLN
INTRAVENOUS | Status: DC | PRN
  Administered 2015-09-15: 10 mg via INTRAVENOUS

## 2015-09-15 MED ORDER — SUCCINYLCHOLINE CHLORIDE 20 MG/ML IJ SOLN
INTRAMUSCULAR | Status: DC | PRN
  Administered 2015-09-15: 120 mg via INTRAVENOUS

## 2015-09-15 MED ORDER — ONDANSETRON HCL 4 MG/2ML IJ SOLN: 4.0000 mg | Freq: Once | INTRAMUSCULAR | Status: DC | PRN

## 2015-09-15 MED ORDER — HYDROCODONE-ACETAMINOPHEN 7.5-325 MG PO TABS
1.0000 | ORAL_TABLET | Freq: Four times a day (QID) | ORAL | Status: DC | PRN
  Administered 2015-09-15: 1 via ORAL

## 2015-09-15 MED ORDER — FENTANYL CITRATE (PF) 100 MCG/2ML IJ SOLN
50.0000 ug | Freq: Once | INTRAMUSCULAR | Status: AC
  Administered 2015-09-15: 50 ug via INTRAVENOUS

## 2015-09-15 MED ORDER — HYDROMORPHONE HCL 1 MG/ML IJ SOLN
0.5000 mg | INTRAMUSCULAR | Status: DC | PRN
  Administered 2015-09-15 (×2): 0.5 mg via INTRAVENOUS

## 2015-09-15 MED ORDER — HYDROCODONE-ACETAMINOPHEN 7.5-325 MG PO TABS: 1.0000 | ORAL_TABLET | Freq: Four times a day (QID) | ORAL | Status: DC | PRN

## 2015-09-15 MED ORDER — PROPOFOL 10 MG/ML IV BOLUS
INTRAVENOUS | Status: DC | PRN
  Administered 2015-09-15: 150 mg via INTRAVENOUS

## 2015-09-15 MED ORDER — GABAPENTIN 400 MG PO CAPS
ORAL_CAPSULE | ORAL | Status: AC
  Filled 2015-09-15: qty 1

## 2015-09-15 MED ORDER — KETOROLAC TROMETHAMINE 15 MG/ML IJ SOLN
15.0000 mg | Freq: Once | INTRAMUSCULAR | Status: DC
  Filled 2015-09-15: qty 1

## 2015-09-15 MED ORDER — CEFAZOLIN SODIUM-DEXTROSE 2-3 GM-% IV SOLR: 2.0000 g | Freq: Once | INTRAVENOUS | Status: DC

## 2015-09-15 MED ORDER — HYDROMORPHONE HCL 1 MG/ML IJ SOLN
INTRAMUSCULAR | Status: AC
  Filled 2015-09-15: qty 1

## 2015-09-15 MED ORDER — SODIUM CHLORIDE 0.9 % IV SOLN
INTRAVENOUS | Status: DC
Start: 2015-09-15 — End: 2015-09-15
  Administered 2015-09-15: 09:00:00 via INTRAVENOUS

## 2015-09-15 MED ORDER — GABAPENTIN 400 MG PO CAPS
400.0000 mg | ORAL_CAPSULE | Freq: Three times a day (TID) | ORAL | Status: DC
Start: 2015-09-15 — End: 2016-02-16

## 2015-09-15 MED ORDER — MELOXICAM 15 MG PO TABS: 15.0000 mg | ORAL_TABLET | Freq: Every day | ORAL | Status: DC

## 2015-09-15 MED ORDER — LIDOCAINE HCL (CARDIAC) 20 MG/ML IV SOLN
INTRAVENOUS | Status: DC | PRN
  Administered 2015-09-15: 100 mg via INTRAVENOUS

## 2015-09-15 MED ORDER — SUGAMMADEX SODIUM 200 MG/2ML IV SOLN
INTRAVENOUS | Status: DC | PRN
  Administered 2015-09-15: 227.8 mg via INTRAVENOUS

## 2015-09-15 MED ORDER — CEFAZOLIN SODIUM-DEXTROSE 2-3 GM-% IV SOLR
INTRAVENOUS | Status: AC
  Administered 2015-09-15: 2 g via INTRAVENOUS
  Filled 2015-09-15: qty 50

## 2015-09-15 MED ORDER — FENTANYL CITRATE (PF) 100 MCG/2ML IJ SOLN
INTRAMUSCULAR | Status: AC
  Administered 2015-09-15: 100 ug via INTRAVENOUS
  Filled 2015-09-15: qty 2

## 2015-09-15 MED ORDER — HYDROCODONE-ACETAMINOPHEN 7.5-325 MG PO TABS
ORAL_TABLET | ORAL | Status: AC
  Administered 2015-09-15: 1 via ORAL
  Filled 2015-09-15: qty 1

## 2015-09-15 MED ORDER — MIDAZOLAM HCL 5 MG/5ML IJ SOLN
0.5000 mg | Freq: Once | INTRAMUSCULAR | Status: AC
  Administered 2015-09-15: 0.5 mg via INTRAVENOUS

## 2015-09-15 MED ORDER — FENTANYL CITRATE (PF) 100 MCG/2ML IJ SOLN
25.0000 ug | INTRAMUSCULAR | Status: AC | PRN
  Administered 2015-09-15 (×6): 25 ug via INTRAVENOUS

## 2015-09-15 MED ORDER — MIDAZOLAM HCL 5 MG/5ML IJ SOLN
1.5000 mg | Freq: Once | INTRAMUSCULAR | Status: AC
  Administered 2015-09-15: 1.5 mg via INTRAVENOUS

## 2015-09-15 MED ORDER — MELOXICAM 7.5 MG PO TABS
ORAL_TABLET | ORAL | Status: AC
  Filled 2015-09-15: qty 2

## 2015-09-15 MED ORDER — LIDOCAINE HCL (PF) 1 % IJ SOLN
INTRAMUSCULAR | Status: AC
  Filled 2015-09-15: qty 5

## 2015-09-15 MED ORDER — ONDANSETRON HCL 4 MG/2ML IJ SOLN
INTRAMUSCULAR | Status: DC | PRN
  Administered 2015-09-15: 4 mg via INTRAVENOUS

## 2015-09-15 MED ORDER — MORPHINE SULFATE (PF) 4 MG/ML IV SOLN
INTRAVENOUS | Status: DC | PRN
  Administered 2015-09-15: 4 mg via SUBCUTANEOUS

## 2015-09-15 MED ORDER — BUPIVACAINE-EPINEPHRINE (PF) 0.25% -1:200000 IJ SOLN
INTRAMUSCULAR | Status: AC
Start: 2015-09-15 — End: 2015-09-15
  Filled 2015-09-15: qty 60

## 2015-09-15 MED ORDER — MELOXICAM 7.5 MG PO TABS
15.0000 mg | ORAL_TABLET | Freq: Once | ORAL | Status: AC
  Administered 2015-09-15: 15 mg via ORAL

## 2015-09-15 MED ORDER — GABAPENTIN 400 MG PO CAPS
400.0000 mg | ORAL_CAPSULE | Freq: Once | ORAL | Status: AC
  Administered 2015-09-15: 400 mg via ORAL

## 2015-09-15 MED ORDER — WHITE PETROLATUM GEL
Status: AC
  Filled 2015-09-15: qty 5

## 2015-09-15 SURGICAL SUPPLY — 62 items
ADAPTER IRRIG TUBE 2 SPIKE SOL (ADAPTER) ×4 IMPLANT
ANCHOR ALL-SUT Q-FIX 2.8 (Anchor) ×4 IMPLANT
BLADE AGGRESSIVE PLUS 4.0 (BLADE) IMPLANT
BLADE SURG SZ11 CARB STEEL (BLADE) ×2 IMPLANT
BUR AGGRESSIVE+ 5.5 (BURR) ×2 IMPLANT
BUR BR 5.5 12 FLUTE (BURR) IMPLANT
BUR RADIUS 4.0X18.5 (BURR) IMPLANT
BUR RADIUS 5.5 (BURR) IMPLANT
CANNULA 5.75X7 CRYSTAL CLEAR (CANNULA) IMPLANT
CANNULA 8.5X75 THRED (CANNULA) ×2 IMPLANT
CANNULA PARTIAL THREAD 2X7 (CANNULA) ×2 IMPLANT
CHLORAPREP W/TINT 26ML (MISCELLANEOUS) ×2 IMPLANT
CONNECTOR PERFECT PASSER (CONNECTOR) ×2 IMPLANT
COVER MAYO STAND STRL (DRAPES) ×2 IMPLANT
DRAPE IMP U-DRAPE 54X76 (DRAPES) ×2 IMPLANT
DRAPE SHEET LG 3/4 BI-LAMINATE (DRAPES) ×2 IMPLANT
DRAPE STERI 35X30 U-POUCH (DRAPES) ×2 IMPLANT
GAUZE PETRO XEROFOAM 1X8 (MISCELLANEOUS) ×2 IMPLANT
GAUZE SPONGE 4X4 12PLY STRL (GAUZE/BANDAGES/DRESSINGS) ×2 IMPLANT
GLOVE SURG ORTHO 8.0 STRL STRW (GLOVE) ×2 IMPLANT
GOWN STRL REUS W/ TWL LRG LVL4 (GOWN DISPOSABLE) ×1 IMPLANT
GOWN STRL REUS W/TWL LRG LVL4 (GOWN DISPOSABLE) ×1
IV LACTATED RINGER IRRG 3000ML (IV SOLUTION) ×8
IV LR IRRIG 3000ML ARTHROMATIC (IV SOLUTION) ×8 IMPLANT
KIT RM TURNOVER STRD PROC AR (KITS) ×2 IMPLANT
KIT SHOULDER TRACTION (DRAPES) ×2 IMPLANT
KIT SUTURE 2.8 Q-FIX DISP (MISCELLANEOUS) ×2 IMPLANT
MANIFOLD NEPTUNE II (INSTRUMENTS) ×2 IMPLANT
MAT BLUE FLOOR 46X72 FLO (MISCELLANEOUS) ×2 IMPLANT
MULTI S KNOTLES FIXATION 5.5 (Orthopedic Implant) ×2 IMPLANT
NDL MAYO CATGUT SZ5 (NEEDLE) ×1
NDL SAFETY 18GX1.5 (NEEDLE) ×2 IMPLANT
NDL SUT 5 .5 CRC TPR PNT MAYO (NEEDLE) ×1 IMPLANT
NEEDLE SPNL 18GX3.5 QUINCKE PK (NEEDLE) ×2 IMPLANT
NS IRRIG 500ML POUR BTL (IV SOLUTION) ×2 IMPLANT
PACK ARTHROSCOPY SHOULDER (MISCELLANEOUS) ×2 IMPLANT
PASSER SUT CAPTURE FIRST (SUTURE) ×2 IMPLANT
SET TUBE SUCT SHAVER OUTFL 24K (TUBING) ×2 IMPLANT
SET TUBE TIP INTRA-ARTICULAR (MISCELLANEOUS) ×2 IMPLANT
SLING ULTRA II LG (MISCELLANEOUS) ×2 IMPLANT
SOL PREP PVP 2OZ (MISCELLANEOUS) ×2
SOLUTION PREP PVP 2OZ (MISCELLANEOUS) ×1 IMPLANT
SUT ETH BLK MONO 3 0 FS 1 12/B (SUTURE) IMPLANT
SUT ETHIBOND 2-0  6-8 CP-2 (SUTURE)
SUT ETHIBOND 2-0 6-8 CP-2 (SUTURE) IMPLANT
SUT ETHILON 3 0 FSLX (SUTURE) IMPLANT
SUT ORTHOCORD W/MULTIPK NDL (SUTURE) IMPLANT
SUT PDS PLUS 0 (SUTURE)
SUT PDS PLUS AB 0 CT-2 (SUTURE) IMPLANT
SUT PERFECTPASSER WHITE CART (SUTURE) ×2 IMPLANT
SUT SMART STITCH CARTRIDGE (SUTURE) IMPLANT
SUT VIC AB 2-0 CT2 27 (SUTURE) IMPLANT
SUT VIC AB 4-0 SH 27 (SUTURE)
SUT VIC AB 4-0 SH 27XANBCTRL (SUTURE) IMPLANT
SUT VICRYL 3-0 27IN (SUTURE) IMPLANT
SUTURE MAGNUM WIRE 2X48 BLK (SUTURE) ×4 IMPLANT
SYR 20CC LL (SYRINGE) ×2 IMPLANT
SYR 30ML LL (SYRINGE) ×2 IMPLANT
SYR 50ML LL SCALE MARK (SYRINGE) ×2 IMPLANT
TUBING ARTHRO INFLOW-ONLY STRL (TUBING) ×2 IMPLANT
TUBING CONNECTING 10 (TUBING) ×2 IMPLANT
WAND HAND CNTRL MULTIVAC 90 (MISCELLANEOUS) ×2 IMPLANT

## 2015-09-15 NOTE — Anesthesia Preprocedure Evaluation (Signed)
Anesthesia Evaluation  Patient identified by MRN, date of birth, ID band Patient awake    Reviewed: Allergy & Precautions, H&P , NPO status , Patient's Chart, lab work & pertinent test results, reviewed documented beta blocker date and time   Airway Mallampati: III  TM Distance: >3 FB Neck ROM: full    Dental  (+) Teeth Intact   Pulmonary neg pulmonary ROS, Current Smoker,    Pulmonary exam normal        Cardiovascular hypertension, + Peripheral Vascular Disease  negative cardio ROS Normal cardiovascular exam Rhythm:regular Rate:Normal     Neuro/Psych negative neurological ROS  negative psych ROS   GI/Hepatic negative GI ROS, Neg liver ROS, GERD  ,  Endo/Other  negative endocrine ROS  Renal/GU negative Renal ROS  negative genitourinary   Musculoskeletal   Abdominal   Peds  Hematology negative hematology ROS (+)   Anesthesia Other Findings   Reproductive/Obstetrics negative OB ROS                             Anesthesia Physical Anesthesia Plan  ASA: II  Anesthesia Plan: General ETT   Post-op Pain Management: MAC Combined w/ Regional for Post-op pain   Induction:   Airway Management Planned:   Additional Equipment:   Intra-op Plan:   Post-operative Plan:   Informed Consent: I have reviewed the patients History and Physical, chart, labs and discussed the procedure including the risks, benefits and alternatives for the proposed anesthesia with the patient or authorized representative who has indicated his/her understanding and acceptance.     Plan Discussed with: CRNA  Anesthesia Plan Comments:         Anesthesia Quick Evaluation

## 2015-09-15 NOTE — Progress Notes (Signed)
To PACU for block 0902 - report given to Marjory LiesPam Stall, RN - Ancef 2 gm taken with pt to PACU

## 2015-09-15 NOTE — Transfer of Care (Signed)
Immediate Anesthesia Transfer of Care Note  Patient: Megan Perez  Procedure(s) Performed: Procedure(s): SHOULDER ARTHROSCOPY WITH OPEN ROTATOR CUFF REPAIR (Right)  Patient Location: PACU  Anesthesia Type:General  Level of Consciousness: sedated  Airway & Oxygen Therapy: Patient Spontanous Breathing and Patient connected to face mask oxygen  Post-op Assessment: Report given to RN and Post -op Vital signs reviewed and stable  Post vital signs: Reviewed and stable  Last Vitals:  Filed Vitals:   09/15/15 0817 09/15/15 0915  BP: 167/86 177/104  Pulse: 73   Temp: 36.7 C   Resp: 16     Complications: No apparent anesthesia complications

## 2015-09-15 NOTE — Anesthesia Procedure Notes (Addendum)
Anesthesia Regional Block:  Interscalene brachial plexus block  Pre-Anesthetic Checklist: ,, timeout performed, Correct Patient, Correct Site, Correct Laterality, Correct Procedure, Correct Position, site marked, Risks and benefits discussed,  Surgical consent,  Pre-op evaluation,  At surgeon's request and post-op pain management  Laterality: Right  Prep: chloraprep       Needles:  Injection technique: Single-shot  Needle Type: Echogenic Stimulator Needle     Needle Length: 5cm 5 cm Needle Gauge: 20 and 20 G    Additional Needles:  Procedures: ultrasound guided (picture in chart) and nerve stimulator Interscalene brachial plexus block  Nerve Stimulator or Paresthesia:  Response: biceps flexion, 80 mA,   Additional Responses:   Narrative:  Injection made incrementally with aspirations every 5 mL.  Performed by: Personally   Additional Notes: Functioning IV was confirmed and monitors were applied.  A 50mm 20ga Stimuplex needle was used. Sterile prep and drape,hand hygiene and sterile gloves were used.  Negative aspiration and negative test dose prior to incremental administration of local anesthetic. The patient tolerated the procedure well. US guided placement confirmed and easy to inject and without pain or iv sx. JA      Procedure Name: Intubation Date/Time: 09/15/2015 10:00 AM Performed by: Junious SilkNOLES, Garner Dullea Pre-anesthesia Checklist: Patient identified, Patient being monitored, Timeout performed, Emergency Drugs available and Suction available Patient Re-evaluated:Patient Re-evaluated prior to inductionOxygen Delivery Method: Circle system utilized Preoxygenation: Pre-oxygenation with 100% oxygen Intubation Type: IV induction Ventilation: Mask ventilation without difficulty Laryngoscope Size: Mac and 3 Grade View: Grade I Tube type: Oral Tube size: 7.0 mm Number of attempts: 1 Airway Equipment and Method: Stylet Placement Confirmation: ETT inserted through vocal  cords under direct vision,  positive ETCO2 and breath sounds checked- equal and bilateral Secured at: 21 cm Tube secured with: Tape Dental Injury: Teeth and Oropharynx as per pre-operative assessment

## 2015-09-15 NOTE — Op Note (Signed)
09/15/2015  12:24 PM  PATIENT:  Megan Perez    PRE-OPERATIVE DIAGNOSIS:   RIGHT COMPLETE ROTATOR CUFF TEAR                                                             AC JOINT ARTHRITIS                                                             IMPINGEMENT SUBACROMIAL SPACE                                                             SEVERE FRAYING OF BICEPS TENDON  POST-OPERATIVE DIAGNOSIS:  Same  PROCEDURE:  SHOULDER ARTHROSCOPY WITH ARTHROSCOPIC RIGHT ROTATOR CUFF REPAIR                                                                                         EXCISION OF DISTAL CLAVICLE                                                                                         SUBACROMIAL DECOMPRESSION AND ACROMIOPLASTY                                                                                         BICEPS TENOTOMY  SURGEON:  Emiline Mancebo E, MD   .  ANESTHESIA:   General plus interscalene block  PREOPERATIVE INDICATIONS:  Megan Perez is a  69 y.o. female with a diagnosis of COMPLETE ROTATOR CUFF TEAR who failed conservative measures and elected for surgical management.    The risks benefits and alternatives were discussed with the patient preoperatively including but not limited to the risks of infection, bleeding, nerve injury, cardiopulmonary complications, the need for revision surgery, among others, and the patient was willing to proceed.  OPERATIVE IMPLANTS: None  OPERATIVE FINDINGS: The  patient had severe subacromial bursitis and impingement. The anterior acromion was prominent. The ACjoint was arthritic. The glenohumeral joint was intact.  The biceps tendon was severely frayed.  There is a large 3 cm tear of the supraspinatus with moderate retraction.  There was mild labrum damage  OPERATIVE PROCEDURE: The patient was brought to the operating room where satisfactory general endotracheal and interscalene anesthesia were accomplished.The patient was turned into the  lateral decubitus position and the shoulder was prepped and draped in a sterile fashion. Arthroscopy was carried out from a posterior portal with accessory portals laterally and anteriorly. The  joint was examined first. The above findings were encountered. The motorized shaver was introduced anteriorly and the undersurface of the rotator cuff probed and lightly debrided. The biceps tendon was debrided and then the ArthroCare wand was introduced and the biceps tendon was released completely. The labrum was trimmed up with the ArthroCare wand as well. The arthroscope was redirected into the subacromial space. There was severe bursitis which was resected with the motorized shaver and ArthroCare wand. The large bur was introduced from a posterior portal and the anterior acromion was debrided. The undersurface of the clavicle was debrided with the bur which was then reintroduced from an anterior portal and the remaining distal clavicle completely excised.  Once all preparatory procedures were done the cuff was gently debrided.  The footprint on the greater tuberosity for attachment of the cuff was abraded and then gently burred.  2 Magnum wire sutures were introduced into the tendon and a ArthroCare multi fix anchor was introduced into the greater tuberosity, pulling the tendon over to the tuberosity using the Magnum wires.  Traction had been reduced from 12 pounds to 5 pounds.  An additional Q fix anchor was placed in the anterior tuberosity and a another Magnum wire passed through the anterior cuff and tied down snugly.  This eliminated the opening into the joint completely.  Sutures were cut.  Final debridement was carried out with the motorized shaver and the ArthroCare wand. The joint was flushed and the stab wounds and closed with 3-0 nylon suture. Sponge and needle counts were correct.   The dry sterile dressing and was applied along with a padded sling. Patient was awakened and taken recovery in good condition.    Tenna Child.D.

## 2015-09-15 NOTE — Anesthesia Postprocedure Evaluation (Signed)
Anesthesia Post Note  Patient: Megan Perez  Procedure(s) Performed: Procedure(s) (LRB): SHOULDER ARTHROSCOPY WITH OPEN ROTATOR CUFF REPAIR (Right)  Patient location during evaluation: PACU Anesthesia Type: General Level of consciousness: awake and alert Pain management: pain level controlled Vital Signs Assessment: post-procedure vital signs reviewed and stable Respiratory status: spontaneous breathing, nonlabored ventilation, respiratory function stable and patient connected to nasal cannula oxygen Cardiovascular status: blood pressure returned to baseline and stable Postop Assessment: no signs of nausea or vomiting Anesthetic complications: no    Last Vitals:  Filed Vitals:   09/15/15 0817 09/15/15 0915  BP: 167/86 177/104  Pulse: 73   Temp: 36.7 C   Resp: 16     Last Pain:  Filed Vitals:   09/15/15 0917  PainSc: 7                  Yevette EdwardsJames G Adams

## 2015-09-15 NOTE — H&P (Signed)
THE PATIENT WAS SEEN IN THE HOLDING AREA.  HISTORY, ALLERGIES, HOME MEDICATIONS AND OPERATIVE PROCEDURE WERE REVIEWED. RISKS AND BENEFITS OF SURGERY DISCUSSED WITH PATIENT AGAIN.  NO CHANGES FROM INITIAL HISTORY AND PHYSICAL NOTED.    

## 2015-09-16 ENCOUNTER — Encounter: Payer: Self-pay | Admitting: Specialist

## 2015-10-01 ENCOUNTER — Telehealth: Payer: Self-pay | Admitting: Family Medicine

## 2015-10-01 NOTE — Telephone Encounter (Signed)
Pt called and stated the her bp was 169/95 and she wanted to know if that was high. Under further questioning she stated that she has had recent surgery and is on pain medication. She also stated that when she stood up she was dizzy and that led her to take her bp to begin with. Spoke to Caremark RxShane Tysinger PA-C and he stated that she needs to be aware the pain medication can make her bp run a little high. He recommended that she keep aware of her bp and if she has any other symptoms, such as chest pain, faintness and worsening dizziness, to go to the ER. Pt verbalized understanding. I am forwarding this to JCL due to the fact that she is his pt.

## 2015-10-04 ENCOUNTER — Emergency Department
Admission: EM | Admit: 2015-10-04 | Discharge: 2015-10-04 | Disposition: A | Discharge status: Home / Self Care | Payer: Medicare HMO | Attending: Emergency Medicine | Admitting: Emergency Medicine

## 2015-10-04 ENCOUNTER — Emergency Department: Payer: Medicare HMO

## 2015-10-04 ENCOUNTER — Encounter: Payer: Self-pay | Admitting: Emergency Medicine

## 2015-10-04 DIAGNOSIS — Y998 Other external cause status: Secondary | ICD-10-CM | POA: Insufficient documentation

## 2015-10-04 DIAGNOSIS — S0990XA Unspecified injury of head, initial encounter: Secondary | ICD-10-CM | POA: Insufficient documentation

## 2015-10-04 DIAGNOSIS — X58XXXA Exposure to other specified factors, initial encounter: Secondary | ICD-10-CM | POA: Insufficient documentation

## 2015-10-04 DIAGNOSIS — Y9289 Other specified places as the place of occurrence of the external cause: Secondary | ICD-10-CM | POA: Insufficient documentation

## 2015-10-04 DIAGNOSIS — Z79899 Other long term (current) drug therapy: Secondary | ICD-10-CM | POA: Insufficient documentation

## 2015-10-04 DIAGNOSIS — I10 Essential (primary) hypertension: Secondary | ICD-10-CM | POA: Insufficient documentation

## 2015-10-04 DIAGNOSIS — H81399 Other peripheral vertigo, unspecified ear: Secondary | ICD-10-CM | POA: Insufficient documentation

## 2015-10-04 DIAGNOSIS — Z791 Long term (current) use of non-steroidal anti-inflammatories (NSAID): Secondary | ICD-10-CM | POA: Insufficient documentation

## 2015-10-04 DIAGNOSIS — Y9389 Activity, other specified: Secondary | ICD-10-CM | POA: Insufficient documentation

## 2015-10-04 DIAGNOSIS — R42 Dizziness and giddiness: Secondary | ICD-10-CM | POA: Diagnosis present

## 2015-10-04 LAB — CBC WITH DIFFERENTIAL/PLATELET
Basophils Absolute: 0.1 10*3/uL (ref 0–0.1)
Basophils Relative: 1 %
Eosinophils Absolute: 0 10*3/uL (ref 0–0.7)
Eosinophils Relative: 1 %
HCT: 39 % (ref 35.0–47.0)
Hemoglobin: 12.7 g/dL (ref 12.0–16.0)
Lymphocytes Relative: 34 %
Lymphs Abs: 1.7 10*3/uL (ref 1.0–3.6)
MCH: 30.1 pg (ref 26.0–34.0)
MCHC: 32.7 g/dL (ref 32.0–36.0)
MCV: 92.1 fL (ref 80.0–100.0)
Monocytes Absolute: 0.3 10*3/uL (ref 0.2–0.9)
Monocytes Relative: 6 %
Neutro Abs: 2.9 10*3/uL (ref 1.4–6.5)
Neutrophils Relative %: 58 %
Platelets: 343 10*3/uL (ref 150–440)
RBC: 4.24 MIL/uL (ref 3.80–5.20)
RDW: 14.7 % — ABNORMAL HIGH (ref 11.5–14.5)
WBC: 5 10*3/uL (ref 3.6–11.0)

## 2015-10-04 LAB — BASIC METABOLIC PANEL
Anion gap: 8 (ref 5–15)
BUN: 13 mg/dL (ref 6–20)
CO2: 29 mmol/L (ref 22–32)
Calcium: 9.4 mg/dL (ref 8.9–10.3)
Chloride: 102 mmol/L (ref 101–111)
Creatinine, Ser: 0.76 mg/dL (ref 0.44–1.00)
GFR calc Af Amer: >60 mL/min (ref >60)
GFR calc non Af Amer: >60 mL/min (ref >60)
Glucose, Bld: 107 mg/dL — ABNORMAL HIGH (ref 65–99)
Potassium: 3.9 mmol/L (ref 3.5–5.1)
Sodium: 139 mmol/L (ref 135–145)

## 2015-10-04 MED ORDER — MECLIZINE HCL 25 MG PO TABS
25.0000 mg | ORAL_TABLET | Freq: Once | ORAL | Status: AC
  Administered 2015-10-04: 25 mg via ORAL
  Filled 2015-10-04: qty 1

## 2015-10-04 MED ORDER — SODIUM CHLORIDE 0.9 % IV BOLUS (SEPSIS)
500.0000 mL | Freq: Once | INTRAVENOUS | Status: AC
  Administered 2015-10-04: 500 mL via INTRAVENOUS

## 2015-10-04 MED ORDER — MECLIZINE HCL 12.5 MG PO TABS: 12.5000 mg | ORAL_TABLET | Freq: Three times a day (TID) | ORAL | Status: DC | PRN

## 2015-10-04 MED ORDER — KETOROLAC TROMETHAMINE 30 MG/ML IJ SOLN
15.0000 mg | Freq: Once | INTRAMUSCULAR | Status: AC
  Administered 2015-10-04: 15 mg via INTRAVENOUS
  Filled 2015-10-04: qty 1

## 2015-10-04 MED ORDER — LORAZEPAM 2 MG/ML IJ SOLN
0.5000 mg | Freq: Once | INTRAMUSCULAR | Status: AC
  Administered 2015-10-04: 0.5 mg via INTRAVENOUS
  Filled 2015-10-04: qty 1

## 2015-10-04 MED ORDER — ONDANSETRON HCL 4 MG PO TABS: 4.0000 mg | ORAL_TABLET | Freq: Three times a day (TID) | ORAL | Status: DC | PRN

## 2015-10-04 NOTE — ED Notes (Signed)
Notes show that the pt did call PCP on Friday and about b/p and dizziness.. States she has not taken the gabapentin or hydrocodone in 2 days, states she feels that the dizziness has improved..[name]

## 2015-10-04 NOTE — ED Notes (Signed)
Pt presents with nausea and abd pain. Pt with recent surgery right arm.

## 2015-10-04 NOTE — ED Provider Notes (Signed)
Time Seen: Approximately 1140  I have reviewed the triage notes  Chief Complaint: Nausea and Abdominal Pain   History of Present Illness: [name] is a 70 y.o. female who presents with feelings of dizziness described as vertiginous in nature. She denies any feelings of lightheadedness or near syncope. She states that she feels "" off balance "" at times. She denies any focal weakness in either upper or lower extremities. She denies any difficulty with speech or swallowing. Patient's had recent shoulder surgery and is still currently receiving pain medications and has right shoulder sling in place. She denies any ear pain or deafness she denies any facial pain she denies any visual disturbances. She states her symptoms resolve whenever she rolls her head still.   Past Medical History  Diagnosis Date  . Hypertension   . Prediabetes   . OA (osteoarthritis)     knee  . GERD (gastroesophageal reflux disease)   . Anxiety     PT ONLY USES ALBUTEROL INHALER DURING PANIC ATTACK -PT DENIES ASTHMA OR COPD  . Bronchitis     Patient Active Problem List   Diagnosis Date Noted  . Status post total right knee replacement 09/01/2014  . Allergic rhinitis 01/21/2014  . Morbid obesity with BMI of 40.0-44.9, adult (HCC) 04/08/2012  . Arthritis 11/24/2011  . Essential hypertension 08/19/2010  . Venous (peripheral) insufficiency 08/19/2010  . GERD 08/19/2010  . [facility]    Past Surgical History  Procedure Laterality Date  . Knee surgery    . Abdominal hysterectomy    . Cholecystectomy    . Replacement total knee      left  . Heel spur surgery    . Shoulder surgery Left   . Shoulder arthroscopy with open rotator cuff repair Right 09/15/2015    Procedure: SHOULDER ARTHROSCOPY WITH OPEN ROTATOR CUFF REPAIR;  Surgeon: Deeann SaintHoward Miller, MD;  Location: [facility] ORS;  Service: Orthopedics;  Laterality: Right;    Past Surgical History  Procedure  Laterality Date  . Knee surgery    . Abdominal hysterectomy    . Cholecystectomy    . Replacement total knee      left  . Heel spur surgery    . Shoulder surgery Left   . Shoulder arthroscopy with open rotator cuff repair Right 09/15/2015    Procedure: SHOULDER ARTHROSCOPY WITH OPEN ROTATOR CUFF REPAIR;  Surgeon: Deeann SaintHoward Miller, MD;  Location: [facility] ORS;  Service: Orthopedics;  Laterality: Right;    Current Outpatient Rx  Name  Route  Sig  Dispense  Refill  . albuterol (PROVENTIL HFA;VENTOLIN HFA) 108 (90 BASE) MCG/ACT inhaler   Inhalation   Inhale 2 puffs into the lungs every 6 (six) hours as needed for wheezing or shortness of breath.   1 Inhaler   0   . furosemide (LASIX) 40 MG tablet      TAKE 1 TABLET BY MOUTH EVERY DAY   90 tablet   0     Needs physical for any more refills   . gabapentin (NEURONTIN) 400 MG capsule   Oral   Take 1 capsule (400 mg total) by mouth 3 (three) times daily.   60 capsule   3   . HYDROcodone-acetaminophen (NORCO) 7.5-325 MG tablet   Oral   Take 1 tablet by mouth every 6 (six) hours as needed for moderate pain.   50 tablet   0   . lisinopril (PRINIVIL,ZESTRIL) 20 MG tablet   Oral  Take 1 tablet (20 mg total) by mouth daily.   90 tablet   3   . meloxicam (MOBIC) 15 MG tablet   Oral   Take 1 tablet (15 mg total) by mouth daily.   30 tablet   3   . omeprazole (PRILOSEC) 40 MG capsule   Oral   Take 1 capsule (40 mg total) by mouth daily.   30 capsule   3   . meclizine (ANTIVERT) 12.5 MG tablet   Oral   Take 1 tablet (12.5 mg total) by mouth 3 (three) times daily as needed for dizziness or nausea.   30 tablet   1   . ondansetron (ZOFRAN) 4 MG tablet   Oral   Take 1 tablet (4 mg total) by mouth every 8 (eight) hours as needed for nausea or vomiting.   21 tablet   0     Allergies:  Prednisone and Oxycodone  Family History: No family history on file.  Social History: Social History  Substance Use Topics  . Smoking  status: Never Smoker   . Smokeless tobacco: Never Used  . Alcohol Use: No     Review of Systems:   10 point review of systems was performed and was otherwise negative:  Constitutional: No fever Eyes: No visual disturbances ENT: No sore throat, ear pain Cardiac: No chest pain Respiratory: No shortness of breath, wheezing, or stridor Abdomen: No abdominal pain, no vomiting, No diarrhea Endocrine: No weight loss, No night sweats Extremities: No peripheral edema, cyanosis Skin: No rashes, easy bruising Neurologic: No focal weakness, trouble with speech or swollowing Urologic: No dysuria, Hematuria, or urinary frequency   Physical Exam:  ED Triage Vitals  Enc Vitals Group     BP 10/04/15 1009 146/79 mmHg     Pulse Rate 10/04/15 1009 81     Resp 10/04/15 1009 18     Temp 10/04/15 1009 98.2 F (36.8 C)     Temp Source 10/04/15 1009 Oral     SpO2 10/04/15 1009 99 %     Weight 10/04/15 1009 250 lb (113.399 kg)     Height 10/04/15 1009 5\' 7"  (1.702 m)     Head Cir --      Peak Flow --      Pain Score 10/04/15 1010 7     Pain Loc --      Pain Edu? --      Excl. in GC? --     General: Awake , Alert , and Oriented times 3; GCS 15 Head: Normal cephalic , atraumatic Eyes: Pupils equal , round, reactive to light. Mild nystagmus which is exhaustive Nose/Throat: No nasal drainage, patent upper airway without erythema or exudate.  Neck: Supple, Full range of motion, No anterior adenopathy or palpable thyroid masses Lungs: Clear to ascultation without wheezes , rhonchi, or rales Heart: Regular rate, regular rhythm without murmurs , gallops , or rubs Abdomen: Soft, non tender without rebound, guarding , or rigidity; bowel sounds positive and symmetric in all 4 quadrants. No organomegaly .        Extremities: 2 plus symmetric pulses. No edema, clubbing or cyanosis Neurologic: normal ambulation, Motor symmetric without deficits, sensory intact Skin: warm, dry, no rashes   Labs:    All laboratory work was reviewed including any pertinent negatives or positives listed below:  Labs Reviewed  BASIC METABOLIC PANEL - Abnormal; Notable for the following:    Glucose, Bld 107 (*)    All other components within normal  limits  CBC WITH DIFFERENTIAL/PLATELET - Abnormal; Notable for the following:    RDW 14.7 (*)    All other components within normal limits   review of laboratory work shows no significant findings  EKG: * ED ECG REPORT I, Jennye Moccasin, the attending physician, personally viewed and interpreted this ECG.  Date: 10/04/2015 EKG Time: *1121 Rate: *67 Rhythm: normal sinus rhythm QRS Axis: normal Intervals: normal ST/T Wave abnormalities: normal Conduction Disutrbances: none Narrative Interpretation: unremarkable    Radiology:    EXAM: CT HEAD WITHOUT CONTRAST  TECHNIQUE: Contiguous axial images were obtained from the base of the skull through the vertex without intravenous contrast.  COMPARISON: None.  FINDINGS: The brain appears normal without hemorrhage, infarct, mass lesion, mass effect, midline shift or abnormal extra-axial fluid collection. No hydrocephalus or pneumocephalus. The calvarium is intact. Imaged paranasal sinuses and mastoid air cells are clear.  IMPRESSION: Negative head CT.     I personally reviewed the radiologic studies    ED Course: Patient's stay was uneventful and she was symptomatically improved with IV Zofran along with meclizine. The patient's symptoms seem to be consistent with peripheral vertigo. Her symptoms are exhaustive and her head CT shows no findings with 3 day history of of these symptoms. Patient exhibits no focal neurologic deficits and I felt this was unlikely to be central vertigo.    A   Final Clinical Impression Peripheral vertigo   Plan:  Patient will be prescribed similar medications here and is advised continue with her current pain regimen. She's been advised drink plenty  of fluids and return here if she has any focal weakness in either upper or lower extremities. She should also return if she has any chest pain or shortness of breath, etc.            Jennye Moccasin, MD 10/04/15 [phone]

## 2015-10-05 ENCOUNTER — Other Ambulatory Visit: Payer: Self-pay | Admitting: Family Medicine

## 2015-11-19 ENCOUNTER — Encounter: Payer: Self-pay | Admitting: Radiology

## 2015-11-19 ENCOUNTER — Emergency Department: Payer: Medicare HMO

## 2015-11-19 ENCOUNTER — Emergency Department
Admission: EM | Admit: 2015-11-19 | Discharge: 2015-11-19 | Disposition: A | Discharge status: Home / Self Care | Payer: Medicare HMO | Attending: Emergency Medicine | Admitting: Emergency Medicine

## 2015-11-19 DIAGNOSIS — Z791 Long term (current) use of non-steroidal anti-inflammatories (NSAID): Secondary | ICD-10-CM | POA: Insufficient documentation

## 2015-11-19 DIAGNOSIS — R079 Chest pain, unspecified: Secondary | ICD-10-CM | POA: Diagnosis present

## 2015-11-19 DIAGNOSIS — R0600 Dyspnea, unspecified: Secondary | ICD-10-CM

## 2015-11-19 DIAGNOSIS — Z79899 Other long term (current) drug therapy: Secondary | ICD-10-CM | POA: Insufficient documentation

## 2015-11-19 DIAGNOSIS — I10 Essential (primary) hypertension: Secondary | ICD-10-CM | POA: Diagnosis not present

## 2015-11-19 LAB — CBC
HCT: 34 % — ABNORMAL LOW (ref 35.0–47.0)
Hemoglobin: 11.3 g/dL — ABNORMAL LOW (ref 12.0–16.0)
MCH: 31.1 pg (ref 26.0–34.0)
MCHC: 33.4 g/dL (ref 32.0–36.0)
MCV: 93.1 fL (ref 80.0–100.0)
Platelets: 254 10*3/uL (ref 150–440)
RBC: 3.65 MIL/uL — ABNORMAL LOW (ref 3.80–5.20)
RDW: 15.8 % — ABNORMAL HIGH (ref 11.5–14.5)
WBC: 3.1 10*3/uL — ABNORMAL LOW (ref 3.6–11.0)

## 2015-11-19 LAB — BASIC METABOLIC PANEL
Anion gap: 7 (ref 5–15)
BUN: 13 mg/dL (ref 6–20)
CO2: 28 mmol/L (ref 22–32)
Calcium: 8.6 mg/dL — ABNORMAL LOW (ref 8.9–10.3)
Chloride: 105 mmol/L (ref 101–111)
Creatinine, Ser: 0.83 mg/dL (ref 0.44–1.00)
GFR calc Af Amer: >60 mL/min (ref >60)
GFR calc non Af Amer: >60 mL/min (ref >60)
Glucose, Bld: 109 mg/dL — ABNORMAL HIGH (ref 65–99)
Potassium: 3.8 mmol/L (ref 3.5–5.1)
Sodium: 140 mmol/L (ref 135–145)

## 2015-11-19 LAB — TROPONIN I
Troponin I: <0.03 ng/mL (ref <0.031)
Troponin I: <0.03 ng/mL (ref <0.031)

## 2015-11-19 LAB — BRAIN NATRIURETIC PEPTIDE: B Natriuretic Peptide: 17 pg/mL (ref 0.0–100.0)

## 2015-11-19 LAB — FIBRIN DERIVATIVES D-DIMER (ARMC ONLY): Fibrin derivatives D-dimer (ARMC): 940 — ABNORMAL HIGH (ref 0–499)

## 2015-11-19 MED ORDER — FUROSEMIDE 40 MG PO TABS
80.0000 mg | ORAL_TABLET | Freq: Once | ORAL | Status: AC
  Administered 2015-11-19: 80 mg via ORAL
  Filled 2015-11-19: qty 2

## 2015-11-19 MED ORDER — ACETAMINOPHEN 325 MG PO TABS
ORAL_TABLET | ORAL | Status: AC
  Administered 2015-11-19: 650 mg via ORAL
  Filled 2015-11-19: qty 2

## 2015-11-19 MED ORDER — IOHEXOL 350 MG/ML SOLN
100.0000 mL | Freq: Once | INTRAVENOUS | Status: AC | PRN
  Administered 2015-11-19: 100 mL via INTRAVENOUS

## 2015-11-19 MED ORDER — ACETAMINOPHEN 325 MG PO TABS
650.0000 mg | ORAL_TABLET | Freq: Once | ORAL | Status: AC
  Administered 2015-11-19: 650 mg via ORAL

## 2015-11-19 MED ORDER — IPRATROPIUM-ALBUTEROL 0.5-2.5 (3) MG/3ML IN SOLN
3.0000 mL | Freq: Once | RESPIRATORY_TRACT | Status: AC
  Administered 2015-11-19: 3 mL via RESPIRATORY_TRACT
  Filled 2015-11-19: qty 3

## 2015-11-19 NOTE — ED Provider Notes (Signed)
Accepted patient care, pending repeat troponin 4 mild shortness of breath and atypical chest pain.  Repeat troponin less than 0.03. I did discharge patient with Dr. Charlena Crossqualities return instructions.  Governor Rooksebecca Sharma Lawrance, MD 11/19/15 [phone]

## 2015-11-19 NOTE — ED Notes (Addendum)
Patient ambulatory to triage with steady gait, without difficulty or distress noted; pt reports mid CP x 2 days, nonradiating with prod cough white sputum, SOB

## 2015-11-19 NOTE — Discharge Instructions (Signed)
We believe that your symptoms are caused today by a slightly worsenign of your congestive heart failure or called "fluid on the lungs." Please take your lasix (40 mg) daily the next 3 mornings which you have at home.  Follow up with your doctor as recommended.  If you develop any new or worsening symptoms, including but not limited to fever, persistent vomiting, worsening shortness of breath, or other symptoms that concern you, please return to the Emergency Department immediately.  Shortness of Breath Shortness of breath means you have trouble breathing. Shortness of breath needs medical care right away. HOME CARE   Do not smoke.  Avoid being around chemicals or things (paint fumes, dust) that may bother your breathing.  Rest as needed. Slowly begin your normal activities.  Only take medicines as told by your doctor.  Keep all doctor visits as told. GET HELP RIGHT AWAY IF:   Your shortness of breath gets worse.  You feel lightheaded, pass out (faint), or have a cough that is not helped by medicine.  You cough up blood.  You have pain with breathing.  You have pain in your chest, arms, shoulders, or belly (abdomen).  You have a fever.  You cannot walk up stairs or exercise the way you normally do.  You do not get better in the time expected.  You have a hard time doing normal activities even with rest.  You have problems with your medicines.  You have any new symptoms. MAKE SURE YOU:  Understand these instructions.  Will watch your condition.  Will get help right away if you are not doing well or get worse.   This information is not intended to replace advice given to you by your health care provider. Make sure you discuss any questions you have with your health care provider.   Document Released: 02/21/2008 Document Revised: 09/09/2013 Document Reviewed: 11/20/2011 Elsevier Interactive Patient Education 2016 [employer] Inc[employer] Nonspecific Chest Pain  Chest pain can be  caused by many different conditions. There is always a chance that your pain could be related to something serious, such as a heart attack or a blood clot in your lungs. Chest pain can also be caused by conditions that are not life-threatening. If you have chest pain, it is very important to follow up with your health care provider. CAUSES  Chest pain can be caused by:  Heartburn.  Pneumonia or bronchitis.  Anxiety or stress.  Inflammation around your heart (pericarditis) or lung (pleuritis or pleurisy).  A blood clot in your lung.  A collapsed lung (pneumothorax). It can develop suddenly on its own (spontaneous pneumothorax) or from trauma to the chest.  Shingles infection (varicella-zoster virus).  Heart attack.  Damage to the bones, muscles, and cartilage that make up your chest wall. This can include:  Bruised bones due to injury.  Strained muscles or cartilage due to frequent or repeated coughing or overwork.  Fracture to one or more ribs.  Sore cartilage due to inflammation (costochondritis). RISK FACTORS  Risk factors for chest pain may include:  Activities that increase your risk for trauma or injury to your chest.  Respiratory infections or conditions that cause frequent coughing.  Medical conditions or overeating that can cause heartburn.  Heart disease or family history of heart disease.  Conditions or health behaviors that increase your risk of developing a blood clot.  Having had chicken pox (varicella zoster). SIGNS AND SYMPTOMS Chest pain can feel like:  Burning or tingling on the surface of  your chest or deep in your chest.  Crushing, pressure, aching, or squeezing pain.  Dull or sharp pain that is worse when you move, cough, or take a deep breath.  Pain that is also felt in your back, neck, shoulder, or arm, or pain that spreads to any of these areas. Your chest pain may come and go, or it may stay constant. DIAGNOSIS Lab tests or other studies  may be needed to find the cause of your pain. Your health care provider may have you take a test called an ambulatory ECG (electrocardiogram). An ECG records your heartbeat patterns at the time the test is performed. You may also have other tests, such as:  Transthoracic echocardiogram (TTE). During echocardiography, sound waves are used to create a picture of all of the heart structures and to look at how blood flows through your heart.  Transesophageal echocardiogram (TEE).This is a more advanced imaging test that obtains images from inside your body. It allows your health care provider to see your heart in finer detail.  Cardiac monitoring. This allows your health care provider to monitor your heart rate and rhythm in real time.  Holter monitor. This is a portable device that records your heartbeat and can help to diagnose abnormal heartbeats. It allows your health care provider to track your heart activity for several days, if needed.  Stress tests. These can be done through exercise or by taking medicine that makes your heart beat more quickly.  Blood tests.  Imaging tests. TREATMENT  Your treatment depends on what is causing your chest pain. Treatment may include:  Medicines. These may include:  Acid blockers for heartburn.  Anti-inflammatory medicine.  Pain medicine for inflammatory conditions.  Antibiotic medicine, if an infection is present.  Medicines to dissolve blood clots.  Medicines to treat coronary artery disease.  Supportive care for conditions that do not require medicines. This may include:  Resting.  Applying heat or cold packs to injured areas.  Limiting activities until pain decreases. HOME CARE INSTRUCTIONS  If you were prescribed an antibiotic medicine, finish it all even if you start to feel better.  Avoid any activities that bring on chest pain.  Do not use any tobacco products, including cigarettes, chewing tobacco, or electronic cigarettes. If  you need help quitting, ask your health care provider.  Do not drink alcohol.  Take medicines only as directed by your health care provider.  Keep all follow-up visits as directed by your health care provider. This is important. This includes any further testing if your chest pain does not go away.  If heartburn is the cause for your chest pain, you may be told to keep your head raised (elevated) while sleeping. This reduces the chance that acid will go from your stomach into your esophagus.  Make lifestyle changes as directed by your health care provider. These may include:  Getting regular exercise. Ask your health care provider to suggest some activities that are safe for you.  Eating a heart-healthy diet. A registered dietitian can help you to learn healthy eating options.  Maintaining a healthy weight.  Managing diabetes, if necessary.  Reducing stress. SEEK MEDICAL CARE IF:  Your chest pain does not go away after treatment.  You have a rash with blisters on your chest.  You have a fever. SEEK IMMEDIATE MEDICAL CARE IF:   Your chest pain is worse.  You have an increasing cough, or you cough up blood.  You have severe abdominal pain.  You have  severe weakness.  You faint.  You have chills.  You have sudden, unexplained chest discomfort.  You have sudden, unexplained discomfort in your arms, back, neck, or jaw.  You have shortness of breath at any time.  You suddenly start to sweat, or your skin gets clammy.  You feel nauseous or you vomit.  You suddenly feel light-headed or dizzy.  Your heart begins to beat quickly, or it feels like it is skipping beats. These symptoms may represent a serious problem that is an emergency. Do not wait to see if the symptoms will go away. Get medical help right away. Call your local emergency services (911 in the [place] Do not drive yourself to the hospital.   This information is not intended to replace advice given to you  by your health care provider. Make sure you discuss any questions you have with your health care provider.   Document Released: 06/14/2005 Document Revised: 09/25/2014 Document Reviewed: 04/10/2014 Elsevier Interactive Patient Education [employer]

## 2015-11-19 NOTE — ED Provider Notes (Signed)
[facility] Emergency Department Provider Note  ____________________________________________  Time seen: Approximately 4:24 AM  I have reviewed the triage vital signs and the nursing notes.   HISTORY  Chief Complaint Chest Pain    HPI Megan Perez is a 70 y.o. female history of hypertension, early diabetes, and rotator cuff surgery about 2 months ago.  Patient reports that about 2 days ago she was feeling slight shortness of breath and a "tight" sensation in the chest. She is occasionally been coughing up slight amount of white sputum especially when she lays down night and describes a "rattling" feeling in her chest. She denies pain in the chest, but does state she feels slightly tight. She has used inhaler at home which she reports did not provide much relief. She does report her symptoms are worse when she tries to lay down at night. She denies any leg swelling. No chest pain with walking.  Currently reports she feels okay.The patient denies any history of heart problems or coronary disease. She is a nonsmoker. No recent long trips or travels, no blood in sputum, no leg swelling, no "chest pain", no history of any blood clots. She did have a outpatient procedure performed about 2 months ago.   Past Medical History  Diagnosis Date  . Hypertension   . Prediabetes   . OA (osteoarthritis)     knee  . GERD (gastroesophageal reflux disease)   . Anxiety     PT ONLY USES ALBUTEROL INHALER DURING PANIC ATTACK -PT DENIES ASTHMA OR COPD  . Bronchitis     Patient Active Problem List   Diagnosis Date Noted  . Status post total right knee replacement 09/01/2014  . Allergic rhinitis 01/21/2014  . Morbid obesity with BMI of 40.0-44.9, adult (HCC) 04/08/2012  . Arthritis 11/24/2011  . Essential hypertension 08/19/2010  . Venous (peripheral) insufficiency 08/19/2010  . GERD 08/19/2010  . OSTEOARTHROS UNSPEC GEN/LOC OTH [facility] SITES 08/19/2010    Past Surgical  History  Procedure Laterality Date  . Knee surgery    . Abdominal hysterectomy    . Cholecystectomy    . Replacement total knee      left  . Heel spur surgery    . Shoulder surgery Left   . Shoulder arthroscopy with open rotator cuff repair Right 09/15/2015    Procedure: SHOULDER ARTHROSCOPY WITH OPEN ROTATOR CUFF REPAIR;  Surgeon: [name], MD;  Location: [facility] ORS;  Service: Orthopedics;  Laterality: Right;    Current Outpatient Rx  Name  Route  Sig  Dispense  Refill  . albuterol (PROVENTIL HFA;VENTOLIN HFA) 108 (90 BASE) MCG/ACT inhaler   Inhalation   Inhale 2 puffs into the lungs every 6 (six) hours as needed for wheezing or shortness of breath.   1 Inhaler   0   . furosemide (LASIX) 40 MG tablet      TAKE 1 TABLET BY MOUTH EVERY DAY   90 tablet   0     Needs physical for any more refills   . gabapentin (NEURONTIN) 400 MG capsule   Oral   Take 1 capsule (400 mg total) by mouth 3 (three) times daily.   60 capsule   3   . HYDROcodone-acetaminophen (NORCO) 7.5-325 MG tablet   Oral   Take 1 tablet by mouth every 6 (six) hours as needed for moderate pain.   50 tablet   0   . lisinopril (PRINIVIL,ZESTRIL) 20 MG tablet   Oral   Take 1 tablet (20  mg total) by mouth daily.   90 tablet   3   . meclizine (ANTIVERT) 12.5 MG tablet   Oral   Take 1 tablet (12.5 mg total) by mouth 3 (three) times daily as needed for dizziness or nausea.   30 tablet   1   . meloxicam (MOBIC) 15 MG tablet   Oral   Take 1 tablet (15 mg total) by mouth daily.   30 tablet   3   . omeprazole (PRILOSEC) 40 MG capsule   Oral   Take 1 capsule (40 mg total) by mouth daily.   30 capsule   3   . ondansetron (ZOFRAN) 4 MG tablet   Oral   Take 1 tablet (4 mg total) by mouth every 8 (eight) hours as needed for nausea or vomiting.   21 tablet   0     Allergies Prednisone and Oxycodone  No family history on file.  Social History Social History  Substance Use Topics  . Smoking  status: Never Smoker   . Smokeless tobacco: Never Used  . Alcohol Use: No    Review of Systems Constitutional: No fever/chills Eyes: No visual changes. ENT: No sore throat. Cardiovascular: Denies chest pain. Respiratory: See history of present illness Gastrointestinal: No abdominal pain.  No nausea, no vomiting.  No diarrhea.  No constipation. Genitourinary: Negative for dysuria. Musculoskeletal: Negative for back pain. Skin: Negative for rash. Neurological: Negative for headaches, focal weakness or numbness.  10-point ROS otherwise negative.  ____________________________________________   PHYSICAL EXAM:  VITAL SIGNS: ED Triage Vitals  Enc Vitals Group     BP 11/19/15 0405 155/100 mmHg     Pulse Rate 11/19/15 0405 89     Resp 11/19/15 0405 22     Temp 11/19/15 0405 98.4 F (36.9 C)     Temp Source 11/19/15 0405 Oral     SpO2 11/19/15 0405 96 %     Weight 11/19/15 0405 249 lb (112.946 kg)     Height 11/19/15 0405 5\' 7"  (1.702 m)     Head Cir --      Peak Flow --      Pain Score 11/19/15 0404 8     Pain Loc --      Pain Edu? --      Excl. in GC? --    Constitutional: Alert and oriented. Well appearing and in no acute distress. Amicable and speaks in full and clear sentences. Eyes: Conjunctivae are normal. PERRL. EOMI. Head: Atraumatic. Nose: No congestion/rhinnorhea. Mouth/Throat: Mucous membranes are moist.  Oropharynx non-erythematous. Neck: No stridor.   Cardiovascular: Normal rate, regular rhythm. Grossly normal heart sounds.  Good peripheral circulation. Respiratory: Normal respiratory effort.  No retractions. Lungs CTAB. Full sentences, no evidence of increased work of breathing. Saturation 97% on room air at present. Gastrointestinal: Soft and nontender, but is notably overweight. Musculoskeletal: No lower extremity tenderness nor edema.  Neurologic:  Normal speech and language. No gross focal neurologic deficits are appreciated. Skin:  Skin is warm, dry and  intact. No rash noted. Psychiatric: Mood and affect are normal. Speech and behavior are normal.  ____________________________________________   LABS (all labs ordered are listed, but only abnormal results are displayed)  Labs Reviewed  CBC - Abnormal; Notable for the following:    WBC 3.1 (*)    RBC 3.65 (*)    Hemoglobin 11.3 (*)    HCT 34.0 (*)    RDW 15.8 (*)    All other components within normal limits  BASIC METABOLIC PANEL - Abnormal; Notable for the following:    Glucose, Bld 109 (*)    Calcium 8.6 (*)    All other components within normal limits  FIBRIN DERIVATIVES D-DIMER ([facility] ONLY) - Abnormal; Notable for the following:    Fibrin derivatives D-dimer ([facility]) 940 (*)    All other components within normal limits  BRAIN NATRIURETIC PEPTIDE  TROPONIN I  TROPONIN I   ____________________________________________  EKG  ED ECG REPORT I, QUALE, MARK, the attending physician, personally viewed and interpreted this ECG.  Date: 11/19/2015 EKG Time: 4:15 Rate: 85 Rhythm: normal sinus rhythm QRS Axis: normal Intervals: normal ST/T Wave abnormalities: normal Conduction Disturbances: none Narrative Interpretation: unremarkable  ____________________________________________  RADIOLOGY   CT Angio Chest PE W/Cm &/Or Wo Cm (Final result) Result time: 11/19/15 06:53:13   Final result by Rad Results In Interface (11/19/15 06:53:13)   Narrative:   CLINICAL DATA: Acute onset of mid chest pain and productive cough. Shortness of breath. Initial encounter.  EXAM: CT ANGIOGRAPHY CHEST WITH CONTRAST  TECHNIQUE: Multidetector CT imaging of the chest was performed using the standard protocol during bolus administration of intravenous contrast. Multiplanar CT image reconstructions and MIPs were obtained to evaluate the vascular anatomy.  CONTRAST: OMNIPAQUE IOHEXOL 350 MG/ML SOLN  COMPARISON: Chest radiograph performed earlier today at 4:47 a.m., and CTA of the  chest performed 06/17/2010  FINDINGS: There is no evidence of pulmonary embolus.  Mild haziness is noted within both lungs. This could reflect minimal interstitial edema, or mild atelectasis. There is no evidence of pleural effusion or pneumothorax. No masses are identified; no abnormal focal contrast enhancement is seen.  The mediastinum is unremarkable in appearance. No mediastinal lymphadenopathy is seen. No pericardial effusion is identified. Scattered nodes about the hila appear normal in size. The great vessels are grossly unremarkable. No axillary lymphadenopathy is seen. The thyroid gland is unremarkable in appearance.  The visualized portions of the liver and spleen are unremarkable. The patient is status post cholecystectomy, with clips noted at the gallbladder fossa.  No acute osseous abnormalities are seen.  Review of the MIP images confirms the above findings.  IMPRESSION: 1. No evidence of pulmonary embolus. 2. Mild haziness with both lungs. This could reflect minimal interstitial edema, given the patient's symptoms, or mild atelectasis.   Electronically Signed By: Roanna Raider M.D. On: 11/19/2015 06:53          DG Chest 2 View (Final result) Result time: 11/19/15 05:24:03   Final result by Rad Results In Interface (11/19/15 05:24:03)   Narrative:   CLINICAL DATA: Mid chest pain for 2 days, productive cough. History of hypertension.  EXAM: CHEST 2 VIEW  COMPARISON: Chest radiograph November 05, 2011  FINDINGS: Cardiomediastinal silhouette is normal. Mild bronchitic changes. The lungs are otherwise clear without pleural effusions or focal consolidations. Trachea projects midline and there is no pneumothorax. Soft tissue planes and included osseous structures are non-suspicious. Surgical clips in the included right abdomen compatible with cholecystectomy.  IMPRESSION: Mild bronchitic changes without focal  consolidation.   Electronically Signed By: Awilda Metro M.D. On: 11/19/2015 05:24    ____________________________________________   PROCEDURES  Procedure(s) performed: None  Critical Care performed: No  ____________________________________________   INITIAL IMPRESSION / ASSESSMENT AND PLAN / ED COURSE  Pertinent labs & imaging results that were available during my care of the patient were reviewed by me and considered in my medical decision making (see chart for details).  Patient presents for evaluation of dyspnea. Chest  reports a slight tightness in the chest. She does use an inhaler but states that she does not have a history of asthma or COPD, but that she uses the inhaler when she feels anxious at times. She appears quite well with no evidence of distress to my exam and a normal oxygen saturation. She is deemed low risk for pulmonary embolism, but did have a recent procedure and thus I will send a d-dimer. We will obtain chest x-ray. Her symptoms worsened laying at night and small amount of white sputum could be indicative of congestive heart failure and I will send a BNP, as well as obtain chest x-ray. Her EKG is quite reassuring, and she really denies any "chest pain". After 2 days of symptoms, I suspected of her first troponin is negative this would be highly unlikely to represent acute coronary syndrome.  We will continue to monitor her closely, plan to evaluate for etiology of dyspnea. Patient resting comfortably at this time.  ----------------------------------------- 7:02 AM on 11/19/2015 -----------------------------------------  CT angiography negative for pulmonary mode some. Questionable atelectasis versus mild interstitial edema. Patient does have a history of congestive heart failure, and her symptoms do seem to be somewhat positional that her BNP is normal. We'll give the patient an additional dose of Lasix, and she otherwise is stable without hypoxia or  increased work of breathing at this time. Ongoing care assigned to Dr. Shaune Pollack. As there is mild question on CT of possible slight interstitial edema, I will give the patient a total dose of Lasix this morning and then tell her to continue her regular regimen.  Return precautions and treatment recommendations and follow-up discussed with the patient who is agreeable with the plan and second troponin.   ____________________________________________   FINAL CLINICAL IMPRESSION(S) / ED DIAGNOSES  Final diagnoses:  Dyspnea      Sharyn Creamer, MD 11/19/15 [phone]

## 2015-11-22 ENCOUNTER — Encounter: Payer: Self-pay | Admitting: Family Medicine

## 2015-11-22 ENCOUNTER — Ambulatory Visit (INDEPENDENT_AMBULATORY_CARE_PROVIDER_SITE_OTHER): Payer: Medicare HMO | Admitting: Family Medicine

## 2015-11-22 VITALS — BP 140/90 | HR 93 | Wt 249.0 lb

## 2015-11-22 DIAGNOSIS — J069 Acute upper respiratory infection, unspecified: Secondary | ICD-10-CM

## 2015-11-22 NOTE — Patient Instructions (Signed)
2 Aleve twice per day for the fever aches and pains and good handwashing around people

## 2015-11-22 NOTE — Progress Notes (Signed)
   Subjective:    Patient ID: Megan Perez, female    DOB: 05/12/1946, 70 y.o.   MRN: [id number]  HPI She is here for consult concerning difficulty with a six-day history of intermittent productive cough, congestion, soreness, headache with occasional fever and chills. Today she does state that she does feel slightly better. She was seen in [facility] emergency room for evaluation of this on March 3. That record was reviewed in detail and included blood work and CT as well as x-ray.   Review of Systems     Objective:   Physical Exam Alert and in no distress. Tympanic membranes and canals are normal. Pharyngeal area is normal. Neck is supple without adenopathy or thyromegaly. Cardiac exam shows a regular sinus rhythm without murmurs or gallops. Lungs are clear to auscultation.       Assessment & Plan:  Acute URI I explained that since she is getting better no  further intervention is really necessary. She was comfortable with that.

## 2015-11-25 ENCOUNTER — Telehealth: Payer: Self-pay | Admitting: Family Medicine

## 2015-11-25 MED ORDER — FLUTICASONE PROPIONATE 50 MCG/ACT NA SUSP: 2.0000 | Freq: Every day | NASAL | Status: DC

## 2015-11-25 NOTE — Telephone Encounter (Signed)
Let her know that I called it in 

## 2015-11-25 NOTE — Telephone Encounter (Signed)
Patient advised.

## 2015-11-25 NOTE — Telephone Encounter (Signed)
Pt called and needs Flonase so she can breathe at night as she is so congested.  Please call in [place] [facility] [place].  Pt ph [phone]

## 2016-01-10 ENCOUNTER — Telehealth: Payer: Self-pay | Admitting: Family Medicine

## 2016-01-10 NOTE — Telephone Encounter (Signed)
Left message for pt. She needs a medicare well visit

## 2016-01-31 ENCOUNTER — Ambulatory Visit (INDEPENDENT_AMBULATORY_CARE_PROVIDER_SITE_OTHER): Payer: Medicare HMO | Admitting: Medical

## 2016-01-31 ENCOUNTER — Encounter: Payer: Self-pay | Admitting: Medical

## 2016-01-31 VITALS — BP 136/80 | HR 79 | Temp 98.5°F | Resp 18 | Wt 258.0 lb

## 2016-01-31 DIAGNOSIS — R05 Cough: Secondary | ICD-10-CM

## 2016-01-31 DIAGNOSIS — R059 Cough, unspecified: Secondary | ICD-10-CM

## 2016-01-31 DIAGNOSIS — J209 Acute bronchitis, unspecified: Secondary | ICD-10-CM

## 2016-01-31 DIAGNOSIS — J3489 Other specified disorders of nose and nasal sinuses: Secondary | ICD-10-CM | POA: Diagnosis not present

## 2016-01-31 MED ORDER — AMOXICILLIN 875 MG PO TABS: 875.0000 mg | ORAL_TABLET | Freq: Two times a day (BID) | ORAL | Status: DC

## 2016-01-31 MED ORDER — HYDROCODONE-HOMATROPINE 5-1.5 MG/5ML PO SYRP: 5.0000 mL | ORAL_SOLUTION | Freq: Three times a day (TID) | ORAL | Status: DC | PRN

## 2016-01-31 NOTE — Progress Notes (Signed)
Subjective: Chief Complaint  Patient presents with  . URI    states she was coughing all night. said her chest is getting sore, has had to use inhaler. started after her carbon monoxide leak in her home. is getting bad headache   Been feeling bad x 1 week, has bad sinus pressure, cough, feeling bad, didn't sleep an hour last night from all the cough.    Sore in chest from coughing so much.   Has some irritated throat.  No ear pain or pressure.  No NVD.  Felt clammy last night, sweaty but no fever.  No teeth pain.  No sick contacts.  No chest pain.   Always has swelling in legs.   Was taken off lasix several months ago due to bad cramps.   Using Flonase. Taking phenylephrine, tylenol, guaifenesin combo generic OTC.   Feels like she needs antibiotic.   Very stuffy in nose.   Last night used inhaler feeling a little SOB.   Has hx/o bronchitis.   Nonsmoker.  No hx/o CHF.  No other aggravating or relieving factors. No other complaint.   Past Medical History  Diagnosis Date  . Hypertension   . Prediabetes   . OA (osteoarthritis)     knee  . GERD (gastroesophageal reflux disease)   . Anxiety     PT ONLY USES ALBUTEROL INHALER DURING PANIC ATTACK -PT DENIES ASTHMA OR COPD  . Bronchitis    ROS as in subjective  Objective: BP 136/80 mmHg  Pulse 79  Temp(Src) 98.5 F (36.9 C) (Tympanic)  Resp 18  Wt 258 lb (117.028 kg)  SpO2 98%  Wt Readings from Last 3 Encounters:  01/31/16 258 lb (117.028 kg)  11/22/15 249 lb (112.946 kg)  11/19/15 249 lb (112.946 kg)   General appearance: alert, no distress, WD/WN HEENT: normocephalic, sclerae anicteric, TMs pearly, nares with erythema, clear discharge, pharynx normal, no sinus tenderness Oral cavity: MMM, no lesions Neck: supple, no lymphadenopathy, no thyromegaly, no masses, no JVD Heart: RRR, normal S1, S2, no murmurs Lungs: few rhonchi, bronchial sounds in general, no wheezes,no rales Ext: mild 1+ bilat nonpitting edema, lots of adipose tissue  of legs in general Pulses: 1+ symmetric, upper and lower extremities, normal cap refill     Assessment: Encounter Diagnoses  Name Primary?  . Cough Yes  . Acute bronchitis, unspecified organism   . Sinus pressure     Plan: Exam and symptoms suggests bronchitis.  No obvious sign of CHF.  She has gained some weight since last visit but it doesn't sound acute based on her symptoms and report.   Advised relative rest, hydration, albuterol inhaler prn she has at home, can sleep inclined a few days to help with symptoms.  Begin hycodan syrup prn, or use OTC Mucinex DM.   Avoid the current OTC medication given the phenylephrine and possible elevation of BP.  Begin amoxicillin.  If worse or not much improved in the next 3-4 days, then call or recheck.

## 2016-02-08 ENCOUNTER — Telehealth: Payer: Self-pay | Admitting: Medical

## 2016-02-08 NOTE — Telephone Encounter (Signed)
Pt called back states she is worse & wanted to see if Zpac or something had been called in yet.  Said worse since being on antibiotic.

## 2016-02-08 NOTE — Telephone Encounter (Addendum)
Pt called and states that she is still not feeling good and she has no energy. States she is still coughing up green mucous and feels horrible. States she cannot take the Hycodan syrup it makes her sick to her stomach. She is still taking the antibiotic. She wants to know if you would send her a z-pack in instead she thinks it would work better. Pt uses   [facility] [phone] - [place], [place] - [address] [facility] [address] pt would like a call back today about this. Please advise

## 2016-02-08 NOTE — Telephone Encounter (Signed)
This one is yours

## 2016-02-08 NOTE — Telephone Encounter (Signed)
Call back and get symptom update, also see if she has gained any weight since last visit?  I am surprised there is no improvement, so pending reply, may need to see her back to determine if we need to do anything else.

## 2016-02-09 ENCOUNTER — Other Ambulatory Visit: Payer: Self-pay | Admitting: Medical

## 2016-02-09 MED ORDER — AZITHROMYCIN 250 MG PO TABS: ORAL_TABLET | ORAL | Status: DC

## 2016-02-09 MED ORDER — BENZONATATE 200 MG PO CAPS: 200.0000 mg | ORAL_CAPSULE | Freq: Three times a day (TID) | ORAL | Status: DC | PRN

## 2016-02-09 NOTE — Telephone Encounter (Signed)
LMTCB

## 2016-02-09 NOTE — Telephone Encounter (Signed)
zpak and tessalon cough drops sent.   If not much better by weekend, then recheck.

## 2016-02-09 NOTE — Telephone Encounter (Signed)
Pt stated that she is just constantly coughing, and the cough syrup nor antibiotic is working. No weight gain. Pt wants a zpack, and said she does not have the gas to come here multiple times in a week and since she was just here Monday she will not be coming back and her income is fixed so she cant afford it.

## 2016-02-09 NOTE — Telephone Encounter (Signed)
Left on pts personal VM 

## 2016-02-16 ENCOUNTER — Encounter: Payer: Self-pay | Admitting: Family Medicine

## 2016-02-16 ENCOUNTER — Ambulatory Visit (INDEPENDENT_AMBULATORY_CARE_PROVIDER_SITE_OTHER): Payer: Medicare HMO | Admitting: Family Medicine

## 2016-02-16 VITALS — BP 128/70 | HR 73 | Ht 66.5 in | Wt 259.0 lb

## 2016-02-16 DIAGNOSIS — Z1159 Encounter for screening for other viral diseases: Secondary | ICD-10-CM

## 2016-02-16 DIAGNOSIS — Z6841 Body Mass Index (BMI) 40.0 and over, adult: Secondary | ICD-10-CM

## 2016-02-16 DIAGNOSIS — Z96651 Presence of right artificial knee joint: Secondary | ICD-10-CM

## 2016-02-16 DIAGNOSIS — J302 Other seasonal allergic rhinitis: Secondary | ICD-10-CM | POA: Diagnosis not present

## 2016-02-16 DIAGNOSIS — R7303 Prediabetes: Secondary | ICD-10-CM | POA: Insufficient documentation

## 2016-02-16 DIAGNOSIS — K219 Gastro-esophageal reflux disease without esophagitis: Secondary | ICD-10-CM

## 2016-02-16 DIAGNOSIS — R7302 Impaired glucose tolerance (oral): Secondary | ICD-10-CM | POA: Diagnosis not present

## 2016-02-16 DIAGNOSIS — I872 Venous insufficiency (chronic) (peripheral): Secondary | ICD-10-CM

## 2016-02-16 DIAGNOSIS — M1990 Unspecified osteoarthritis, unspecified site: Secondary | ICD-10-CM | POA: Diagnosis not present

## 2016-02-16 DIAGNOSIS — I10 Essential (primary) hypertension: Secondary | ICD-10-CM

## 2016-02-16 NOTE — Patient Instructions (Addendum)
20 minutes of something physical daily even if you break it up in the five-minute segments  Try thr meloxicam the next time that your shoulder hurts

## 2016-02-16 NOTE — Progress Notes (Signed)
Subjective:   HPI  Megan Perez is a 70 y.o. female who presents for a complete physical.She is also here for annual wellness check. She's had previous right shoulder surgery and is having difficulty occasionally with this. She states that she takes a gabapentin on an as-needed basis for control of pain. She is having difficulty with right knee discomfort but has a hard time describing exactly what the problem is.. She has had a replacement but [place] She states that she is having difficulty with her right knee but is having a hard time describing exactly what the problem is. This does interfere with her taking care of her care of herself physically and interfered with potential weight loss. Her reflux seems to be under good control and she has no present complaints. She has a previous history of venous insufficiency but does not complain of any leg discomfort. Review of the record indicates slightly elevated blood sugar. He continues on lisinopril for her blood pressure. She voices no cognitive difficulty or issues with depression. In fact life seems to be going quite well for her.  Medical care team includes:  [email or web address]    Preventative care: Last ophthalmology visit: almost a year ago Last dental visit:last year Last colonoscopy:12/20/2006 Last mammogram:2016 Last gynecological exam:N/A Last EKG:11/20/15 Last labs:09/07/2015  Prior vaccinations: TD or Tdap:09/19/2007 Influenza:06/22/15 Pneumococcal:23/10/07/2012 13; 06/22/15 Shingles/Zostavax: she cant afford  Other: -  Advanced directive: Information given Health care power of attorney: Information given Living will: Information given Reviewed their medical, surgical, family, social, medication, and allergy history and updated chart as appropriate.    Review of Systems Constitutional: -fever, -chills, -sweats, -unexpected weight change, -decreased appetite, -fatigue Allergy: -sneezing, -itching,  -congestion Dermatology: -changing moles, --rash, -lumps ENT: -runny nose, -ear pain, -sore throat, -hoarseness, -sinus pain, -teeth pain, - ringing in ears, -hearing loss, -nosebleeds Cardiology: -chest pain, -palpitations, -swelling, -difficulty breathing when lying flat, -waking up short of breath Respiratory: -cough, -shortness of breath, -difficulty breathing with exercise or exertion, -wheezing, -coughing up blood Gastroenterology: -abdominal pain, -nausea, -vomiting, -diarrhea, -constipation, -blood in stool, -changes in bowel movement, -difficulty swallowing or eating Hematology: -bleeding, -bruising  Musculoskeletal: -joint aches, -muscle aches, -joint swelling, -back pain, -neck pain, -cramping, -changes in gait Ophthalmology: denies vision changes, eye redness, itching, discharge Urology: -burning with urination, -difficulty urinating, -blood in urine, -urinary frequency, -urgency, -incontinence Neurology: -headache, -weakness, -tingling, -numbness, -memory loss, -falls, -dizziness Psychology: -depressed mood, -agitation, -sleep problems     Objective:   Physical Exam  There were no vitals filed for this visit.  General appearance: alert, no distress, WD/WN,  Skin:  HEENT: normocephalic, conjunctiva/corneas normal, sclerae anicteric, PERRLA, EOMi, nares patent, no discharge or erythema, pharynx normal Oral cavity: MMM, tongue normal, teeth normal Neck: supple, no lymphadenopathy, no thyromegaly, no masses, normal ROM Chest: non tender, normal shape and expansion Heart: RRR, normal S1, S2, no murmurs Lungs: CTA bilaterally, no wheezes, rhonchi, or rales Abdomen: +bs, soft, non tender, non distended, no masses, no hepatomegaly, no splenomegaly, no bruits Musculoskeletal: upper extremities non tender, no obvious deformity, normal ROM throughout, lower extremities non tender, no obvious deformity, normal ROM throughout Extremities: no edema, no cyanosis, no clubbing Pulses: 2+  symmetric, upper and lower extremities, normal cap refill Neurological: alert, oriented x 3, CN2-12 intact, strength normal upper extremities and lower extremities, sensation normal throughout, DTRs 2+ throughout, no cerebellar signs, gait normal Psychiatric: normal affect, behavior normal, pleasant      Assessment and  Plan :   Essential hypertension  Gastroesophageal reflux disease without esophagitis  Arthritis  Morbid obesity with BMI of 40.0-44.9, adult (HCC)  Other seasonal allergic rhinitis  Status post total right knee replacement  Venous (peripheral) insufficiency  Need for hepatitis C screening test - Plan: Hepatitis C antibody  Glucose intolerance (impaired glucose tolerance) - Plan: POCT glycosylated hemoglobin (Hb A1C) She will continue on her present medications. I will have her follow-up with orthopedics in [place] which is where she would like to go. Recommend she stop the gabapentin entirely and use up to 800 mg of ibuprofen to help with her shoulder pain. Will also do hepatitis C and set her up for a DEXA scan.Discussed glucose intolerance with her and strongly encouraged her to get involved in a diet and exercise program. Discussed cutting back on carbohydrates. Hopefully the orthopedic referral will help with getting her more physically active.   Physical exam - discussed healthy lifestyle, diet, exercise, preventative care, vaccinations, and addressed their concerns.   Follow-up 6 months

## 2016-02-17 LAB — HEPATITIS C ANTIBODY: HCV Ab: NEGATIVE

## 2016-02-18 LAB — POCT GLYCOSYLATED HEMOGLOBIN (HGB A1C): Hemoglobin A1C: 6.1

## 2016-05-01 ENCOUNTER — Other Ambulatory Visit (INDEPENDENT_AMBULATORY_CARE_PROVIDER_SITE_OTHER): Payer: Medicare HMO

## 2016-05-01 DIAGNOSIS — Z111 Encounter for screening for respiratory tuberculosis: Secondary | ICD-10-CM | POA: Diagnosis not present

## 2016-05-03 LAB — TB SKIN TEST
Induration: 0 mm
TB Skin Test: NEGATIVE

## 2016-05-26 IMAGING — CT CT HEAD W/O CM
1 series · 16 of 30 positions shown, 20 images · non-contrast
Comparison: None.

CLINICAL DATA: Dizziness, loss of appetite and numbness in both
feet for 2 days. Initial encounter.

EXAM:
CT HEAD WITHOUT CONTRAST
TECHNIQUE: Contiguous axial images were obtained from the base of the skull
through the vertex without intravenous contrast.

[Series 2: head wo · axial · 0.43mm/px · z∈[-116,+17]mm · 16 of 30 slices shown, 20 images]
[im 2/30  brain]
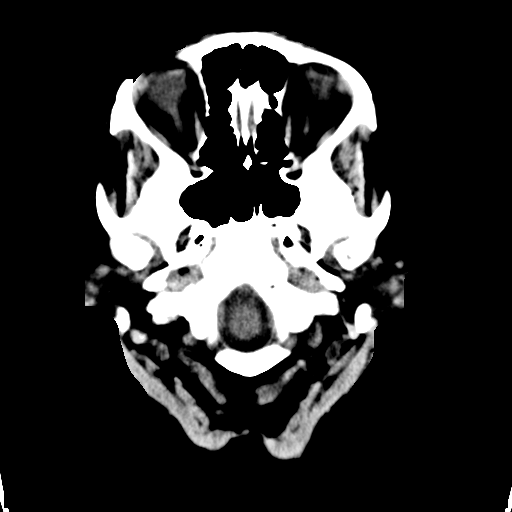
[im 2/30  bone]
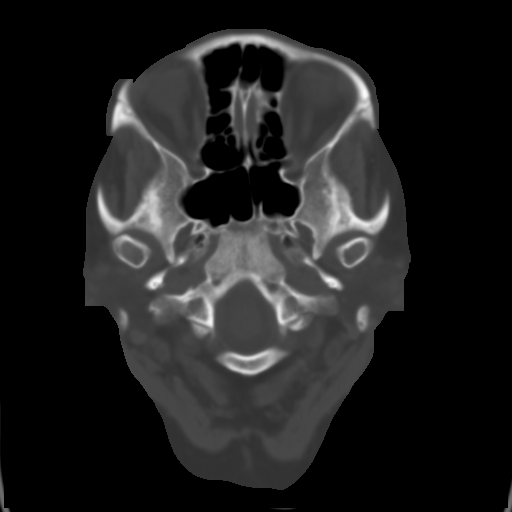
[im 4/30  brain]
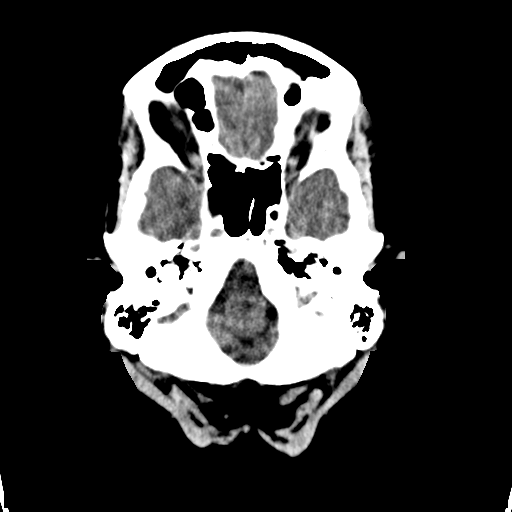
[im 6/30  brain]
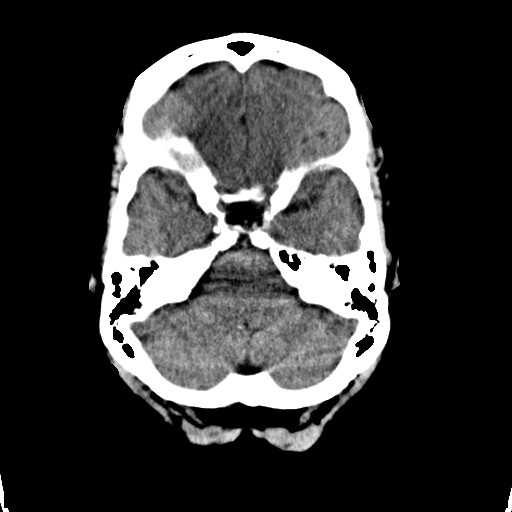
[im 8/30  brain]
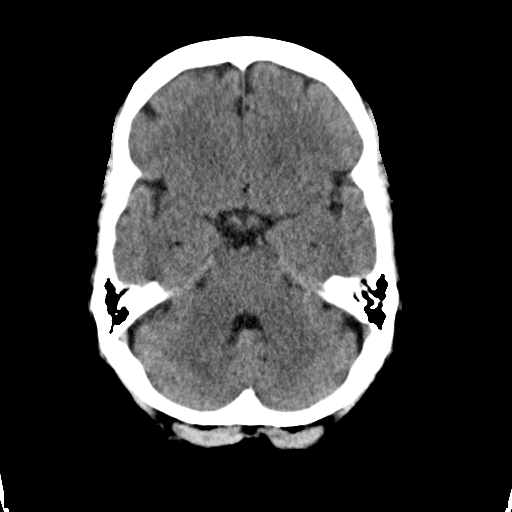
[im 9/30  brain]
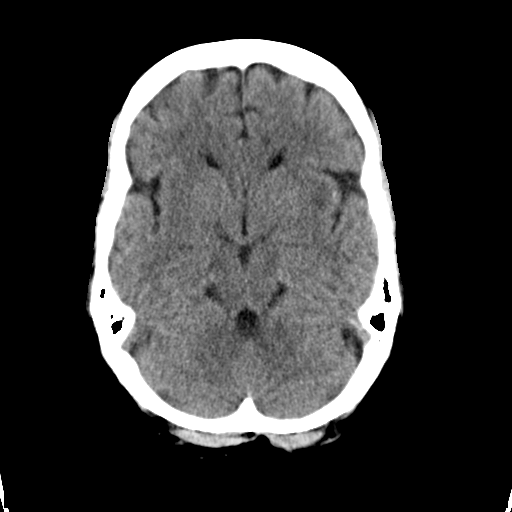
[im 9/30  bone]
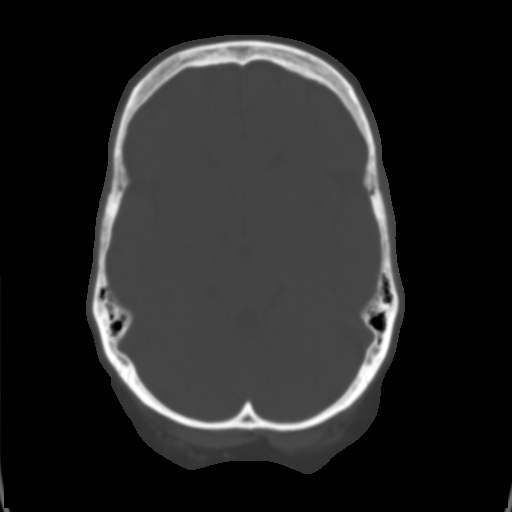
[im 11/30  brain]
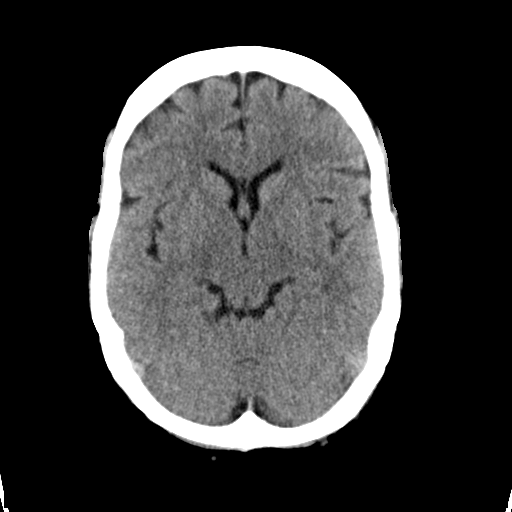
[im 13/30  brain]
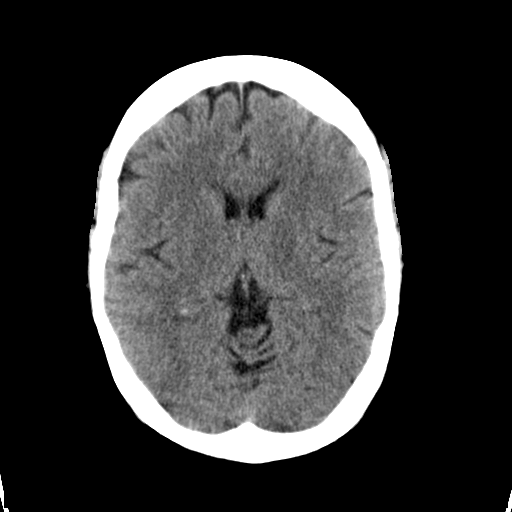
[im 15/30  brain]
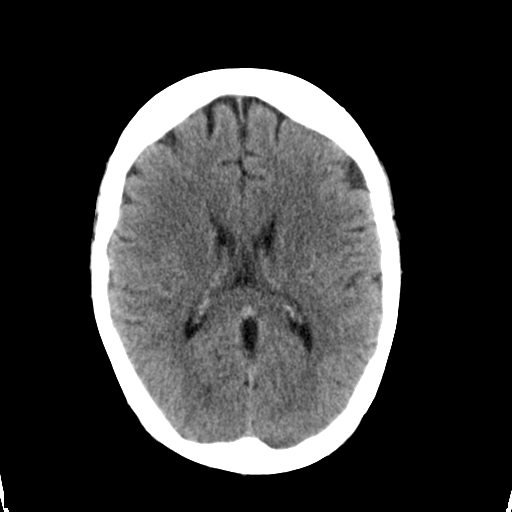
[im 16/30  brain]
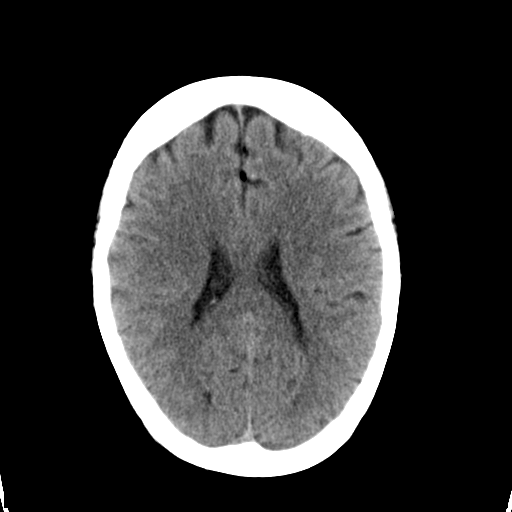
[im 16/30  bone]
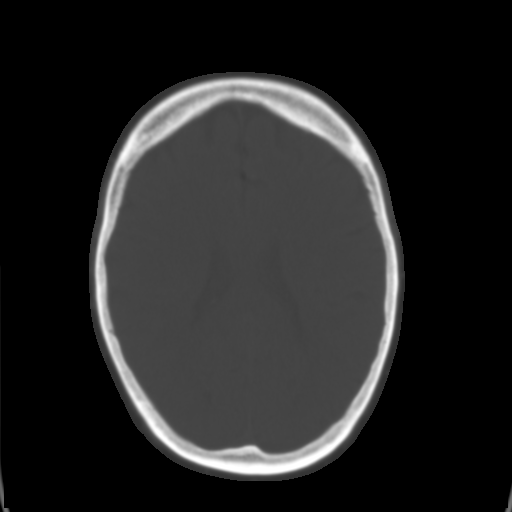
[im 18/30  brain]
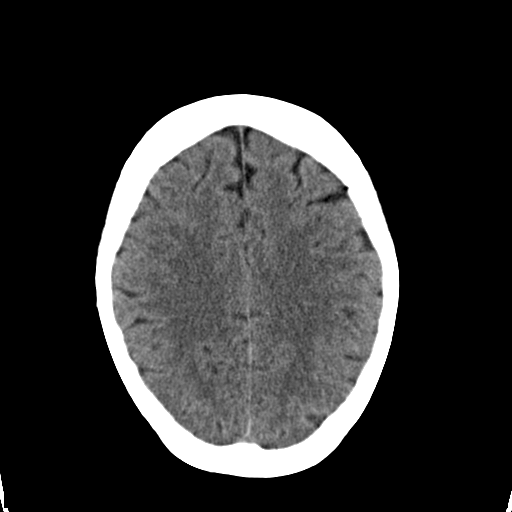
[im 20/30  brain]
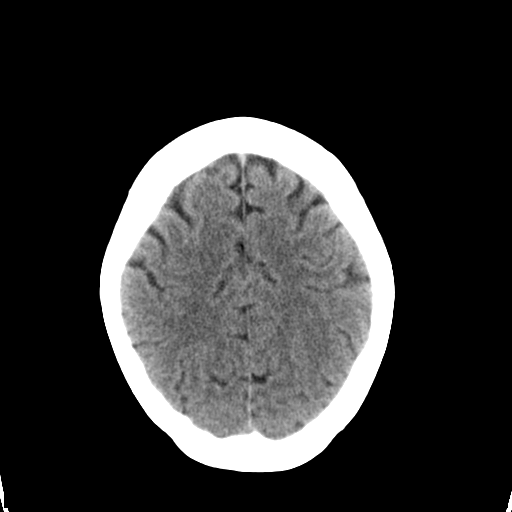
[im 22/30  brain]
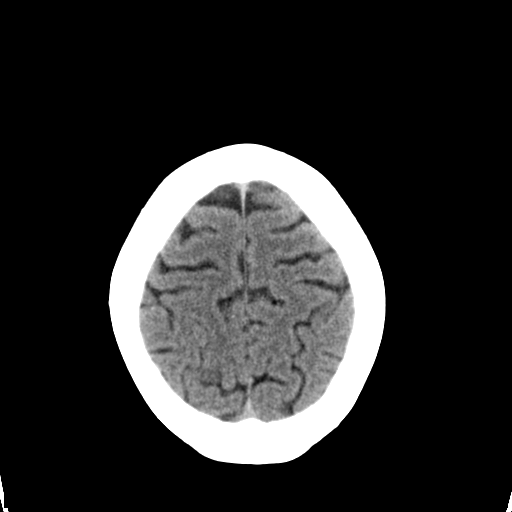
[im 23/30  brain]
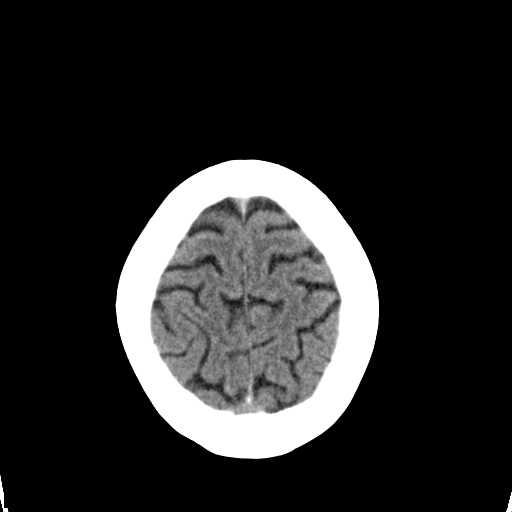
[im 23/30  bone]
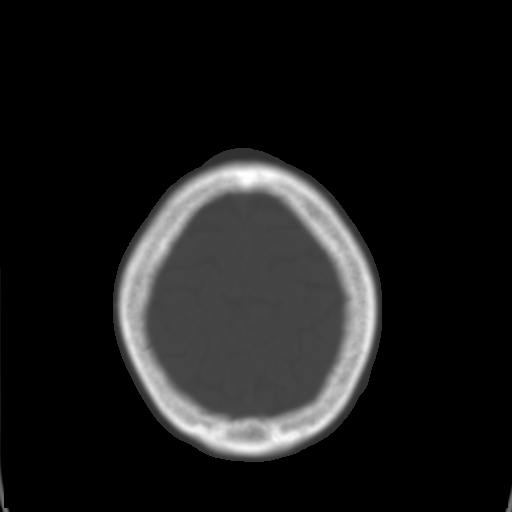
[im 25/30  brain]
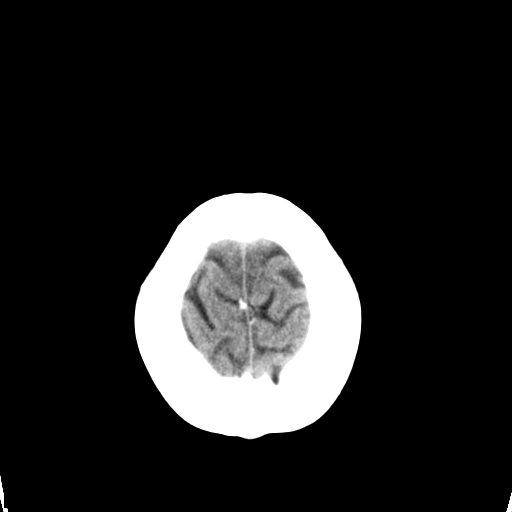
[im 27/30  brain]
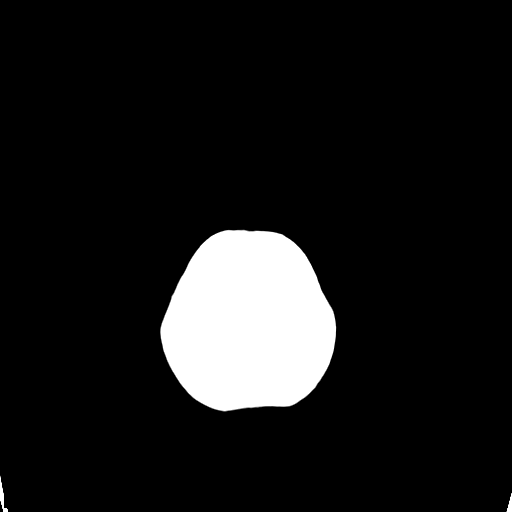
[im 29/30  brain]
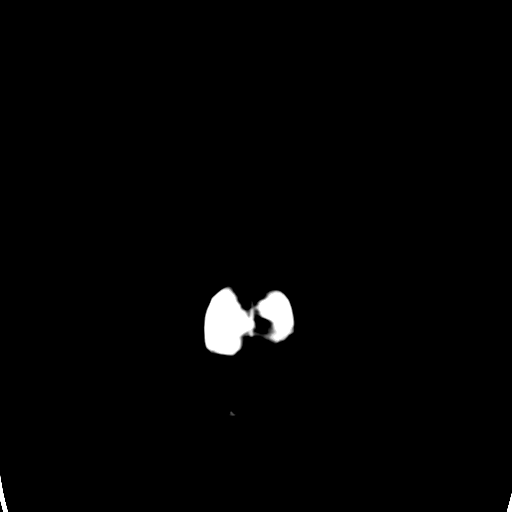

[16 of 30 positions shown; findings below may reference images not displayed]

FINDINGS: The brain appears normal without hemorrhage, infarct, mass lesion,
mass effect, midline shift or abnormal extra-axial fluid collection.
No hydrocephalus or pneumocephalus. The calvarium is intact. Imaged
paranasal sinuses and mastoid air cells are clear.
IMPRESSION: Negative head CT.

## 2016-06-12 ENCOUNTER — Other Ambulatory Visit: Payer: Self-pay | Admitting: Family Medicine

## 2016-06-12 NOTE — Telephone Encounter (Signed)
Is this okay looks like it was discontinued

## 2016-06-19 ENCOUNTER — Telehealth: Payer: Self-pay | Admitting: Family Medicine

## 2016-06-19 NOTE — Telephone Encounter (Signed)
Okay to refill? 

## 2016-06-19 NOTE — Telephone Encounter (Signed)
Pt states she has still been taking the Lasix, states she told you at her last physical just didn't need refill then.  She just takes it as needed for swelling and is having a lot of swelling now due to a lot of standing this weekend and just took the last one.  She would like refill called in

## 2016-06-20 ENCOUNTER — Other Ambulatory Visit: Payer: Self-pay

## 2016-06-20 MED ORDER — FUROSEMIDE 40 MG PO TABS: 40.0000 mg | ORAL_TABLET | Freq: Every day | ORAL | 3 refills | Status: DC

## 2016-06-20 NOTE — Telephone Encounter (Signed)
Sent lasix in was discontinued in 2013

## 2016-07-03 ENCOUNTER — Other Ambulatory Visit: Payer: Self-pay | Admitting: Family Medicine

## 2016-08-02 DIAGNOSIS — M4310 Spondylolisthesis, site unspecified: Secondary | ICD-10-CM | POA: Insufficient documentation

## 2016-09-13 ENCOUNTER — Other Ambulatory Visit: Payer: Self-pay | Admitting: Family Medicine

## 2016-10-14 ENCOUNTER — Other Ambulatory Visit: Payer: Self-pay | Admitting: Family Medicine

## 2016-10-14 DIAGNOSIS — I10 Essential (primary) hypertension: Secondary | ICD-10-CM

## 2016-12-22 ENCOUNTER — Other Ambulatory Visit: Payer: Self-pay | Admitting: Family Medicine

## 2017-01-02 ENCOUNTER — Other Ambulatory Visit: Payer: Self-pay | Admitting: Family Medicine

## 2017-01-02 DIAGNOSIS — Z1231 Encounter for screening mammogram for malignant neoplasm of breast: Secondary | ICD-10-CM

## 2017-01-08 ENCOUNTER — Telehealth: Payer: Self-pay

## 2017-01-08 NOTE — Telephone Encounter (Signed)
Pt was contacted due to need for appt. She also has a request to have handicap placard application to be filled out. Pt was scheduled for 01/16/2017 med check./ RLB

## 2017-01-13 ENCOUNTER — Other Ambulatory Visit: Payer: Self-pay | Admitting: Family Medicine

## 2017-01-16 ENCOUNTER — Encounter: Payer: Self-pay | Admitting: Family Medicine

## 2017-01-16 ENCOUNTER — Ambulatory Visit (INDEPENDENT_AMBULATORY_CARE_PROVIDER_SITE_OTHER): Payer: Medicare HMO | Admitting: Family Medicine

## 2017-01-16 VITALS — BP 124/82 | HR 74 | Ht 67.0 in | Wt 276.0 lb

## 2017-01-16 DIAGNOSIS — J4520 Mild intermittent asthma, uncomplicated: Secondary | ICD-10-CM

## 2017-01-16 DIAGNOSIS — I10 Essential (primary) hypertension: Secondary | ICD-10-CM | POA: Diagnosis not present

## 2017-01-16 DIAGNOSIS — M1990 Unspecified osteoarthritis, unspecified site: Secondary | ICD-10-CM | POA: Diagnosis not present

## 2017-01-16 DIAGNOSIS — K219 Gastro-esophageal reflux disease without esophagitis: Secondary | ICD-10-CM | POA: Diagnosis not present

## 2017-01-16 DIAGNOSIS — J309 Allergic rhinitis, unspecified: Secondary | ICD-10-CM

## 2017-01-16 DIAGNOSIS — R7302 Impaired glucose tolerance (oral): Secondary | ICD-10-CM | POA: Diagnosis not present

## 2017-01-16 DIAGNOSIS — Z6841 Body Mass Index (BMI) 40.0 and over, adult: Secondary | ICD-10-CM | POA: Diagnosis not present

## 2017-01-16 LAB — CMP 10231
AG Ratio: 1.4 Ratio (ref 1.0–2.5)
ALT: 8 U/L (ref 6–29)
AST: 12 U/L (ref 10–35)
Albumin: 3.9 g/dL (ref 3.6–5.1)
Alkaline Phosphatase: 73 U/L (ref 33–130)
BUN/Creatinine Ratio: 20.7 Ratio (ref 6–22)
BUN: 17 mg/dL (ref 7–25)
CO2: 24 mmol/L (ref 20–31)
Calcium: 8.8 mg/dL (ref 8.6–10.4)
Chloride: 105 mmol/L (ref 98–110)
Creat: 0.82 mg/dL (ref 0.60–0.93)
GFR, Est African American: 83 mL/min (ref >=60)
GFR, Est Non African American: 72 mL/min (ref >=60)
Globulin: 2.7 g/dL (ref 1.9–3.7)
Glucose, Bld: 99 mg/dL (ref 65–99)
Potassium: 4 mmol/L (ref 3.5–5.3)
Sodium: 144 mmol/L (ref 135–146)
Total Bilirubin: 0.3 mg/dL (ref 0.2–1.2)
Total Protein: 6.6 g/dL (ref 6.1–8.1)

## 2017-01-16 LAB — LIPID PANEL
Cholesterol: 205 mg/dL — ABNORMAL HIGH (ref <200)
HDL: 72 mg/dL (ref >50)
LDL Cholesterol: 123 mg/dL — ABNORMAL HIGH (ref <100)
Total CHOL/HDL Ratio: 2.8 Ratio (ref <5.0)
Triglycerides: 50 mg/dL (ref <150)
VLDL: 10 mg/dL (ref <30)

## 2017-01-16 LAB — CBC WITH DIFFERENTIAL/PLATELET
Basophils Absolute: 0 cells/uL (ref 0–200)
Basophils Relative: 0 %
Eosinophils Absolute: 147 cells/uL (ref 15–500)
Eosinophils Relative: 3 %
HCT: 35.4 % (ref 35.0–45.0)
Hemoglobin: 11.6 g/dL — ABNORMAL LOW (ref 11.7–15.5)
Lymphocytes Relative: 43 %
Lymphs Abs: 2107 cells/uL (ref 850–3900)
MCH: 30.3 pg (ref 27.0–33.0)
MCHC: 32.8 g/dL (ref 32.0–36.0)
MCV: 92.4 fL (ref 80.0–100.0)
MPV: 9.9 fL (ref 7.5–12.5)
Monocytes Absolute: 294 cells/uL (ref 200–950)
Monocytes Relative: 6 %
Neutro Abs: 2352 cells/uL (ref 1500–7800)
Neutrophils Relative %: 48 %
Platelets: 310 10*3/uL (ref 140–400)
RBC: 3.83 MIL/uL (ref 3.80–5.10)
RDW: 14.7 % (ref 11.0–15.0)
WBC: 4.9 10*3/uL (ref 4.0–10.5)

## 2017-01-16 LAB — POCT GLYCOSYLATED HEMOGLOBIN (HGB A1C): Hemoglobin A1C: 6.1

## 2017-01-16 MED ORDER — OMEPRAZOLE 20 MG PO CPDR: DELAYED_RELEASE_CAPSULE | ORAL | 3 refills | Status: DC

## 2017-01-16 MED ORDER — ALBUTEROL SULFATE HFA 108 (90 BASE) MCG/ACT IN AERS: 2.0000 | INHALATION_SPRAY | Freq: Four times a day (QID) | RESPIRATORY_TRACT | 0 refills | Status: DC | PRN

## 2017-01-16 MED ORDER — LISINOPRIL 20 MG PO TABS: ORAL_TABLET | ORAL | 3 refills | Status: DC

## 2017-01-16 NOTE — Progress Notes (Signed)
   Subjective:    Patient ID: Megan Perez, female    DOB: 22-Dec-1945, 71 y.o.   MRN: [id number]  HPI She is here for medication management visit. Her main concern today was to get a disability placard signed again. She states she gets short of breath with minimal activity has had this for several years. When I tried to quiz her further on this CT then stated that her knees were causing trouble for her need this placard. She has not had any dyspnea on exertion, PND or chest pain. She has had knee replacement surgery and states that she thinks she is going to need the placard for the rest of her life. She continues on Prinivil and having no difficulty with that. She is also taking Prilosec but has now started to take it every other day and notes that she is doing well on this. She does have underlying allergies and uses Flonase. She will occasionally use Proventil for asthma. She apparently is involved in sober sneakers but not going very often. She is now apparently working part-time as a Comptroller. She does note more swelling in her legs if she sits for long periods of time. This gets better when she wakes up in the morning. She has no other concerns or complaints. She does not smoke or drink.    Review of Systems     Objective:   Physical Exam Alert and in no distress. Tympanic membranes and canals are normal. Pharyngeal area is normal. Neck is supple without adenopathy or thyromegaly. Cardiac exam shows a regular sinus rhythm without murmurs or gallops. Lungs are clear to auscultation. A1c is 6.1       Assessment & Plan:  Glucose intolerance (impaired glucose tolerance) - Plan: HgB A1c, CBC with Differential/Platelet, Comprehensive metabolic panel, Lipid panel  Essential hypertension - Plan: CBC with Differential/Platelet, Comprehensive metabolic panel, lisinopril (PRINIVIL,ZESTRIL) 20 MG tablet  Gastroesophageal reflux disease without esophagitis - Plan: omeprazole (PRILOSEC) 20 MG  capsule  Arthritis  Morbid obesity with BMI of 40.0-44.9, adult (HCC) - Plan: CBC with Differential/Platelet, Comprehensive metabolic panel, Lipid panel  Allergic rhinitis, unspecified seasonality, unspecified trigger  Mild intermittent asthma without complication - Plan: albuterol (PROVENTIL HFA;VENTOLIN HFA) 108 (90 Base) MCG/ACT inhaler She is stable on the above diagnoses and medications. I talked to her at length about the fact that her shortness of breath is probably deconditioning and encouraged her to become more physically active. She seemed uninterested to any suggestion I made. Strongly encouraged her to get to the point where she won't need the handicap sticker. Did encourage her to back off on the Prilosec even further to see if she even needs to be taken the Prilosec other than on an intermittent basis.

## 2017-01-16 NOTE — Patient Instructions (Signed)
Cut back on "white food" Try either Allegra or Claritin for the itchy watery eyes and sneezing

## 2017-01-25 ENCOUNTER — Ambulatory Visit
Admission: RE | Admit: 2017-01-25 | Discharge: 2017-01-25 | Disposition: A | Discharge status: Home / Self Care | Payer: Medicare HMO | Source: Ambulatory Visit | Attending: Family Medicine | Admitting: Family Medicine

## 2017-01-25 DIAGNOSIS — Z1231 Encounter for screening mammogram for malignant neoplasm of breast: Secondary | ICD-10-CM | POA: Insufficient documentation

## 2017-02-06 ENCOUNTER — Ambulatory Visit: Payer: Self-pay | Admitting: Nurse Practitioner

## 2017-02-17 ENCOUNTER — Other Ambulatory Visit: Payer: Self-pay | Admitting: Family Medicine

## 2017-02-25 ENCOUNTER — Emergency Department: Payer: Medicare HMO

## 2017-02-25 ENCOUNTER — Encounter: Payer: Self-pay | Admitting: Emergency Medicine

## 2017-02-25 DIAGNOSIS — R918 Other nonspecific abnormal finding of lung field: Secondary | ICD-10-CM | POA: Diagnosis not present

## 2017-02-25 DIAGNOSIS — Z96652 Presence of left artificial knee joint: Secondary | ICD-10-CM | POA: Diagnosis not present

## 2017-02-25 DIAGNOSIS — I10 Essential (primary) hypertension: Secondary | ICD-10-CM | POA: Insufficient documentation

## 2017-02-25 DIAGNOSIS — Z79899 Other long term (current) drug therapy: Secondary | ICD-10-CM | POA: Insufficient documentation

## 2017-02-25 DIAGNOSIS — R002 Palpitations: Secondary | ICD-10-CM | POA: Insufficient documentation

## 2017-02-25 LAB — CBC
HCT: 35.7 % (ref 35.0–47.0)
Hemoglobin: 12 g/dL (ref 12.0–16.0)
MCH: 31.1 pg (ref 26.0–34.0)
MCHC: 33.5 g/dL (ref 32.0–36.0)
MCV: 92.8 fL (ref 80.0–100.0)
Platelets: 299 10*3/uL (ref 150–440)
RBC: 3.85 MIL/uL (ref 3.80–5.20)
RDW: 14.4 % (ref 11.5–14.5)
WBC: 6.2 10*3/uL (ref 3.6–11.0)

## 2017-02-25 LAB — BASIC METABOLIC PANEL
Anion gap: 7 (ref 5–15)
BUN: 23 mg/dL — ABNORMAL HIGH (ref 6–20)
CO2: 27 mmol/L (ref 22–32)
Calcium: 9 mg/dL (ref 8.9–10.3)
Chloride: 108 mmol/L (ref 101–111)
Creatinine, Ser: 0.86 mg/dL (ref 0.44–1.00)
GFR calc Af Amer: >60 mL/min (ref >60)
GFR calc non Af Amer: >60 mL/min (ref >60)
Glucose, Bld: 109 mg/dL — ABNORMAL HIGH (ref 65–99)
Potassium: 3.8 mmol/L (ref 3.5–5.1)
Sodium: 142 mmol/L (ref 135–145)

## 2017-02-25 LAB — TROPONIN I: Troponin I: <0.03 ng/mL (ref <0.03)

## 2017-02-25 NOTE — ED Triage Notes (Signed)
Pt to triage in wheelchair. Pt reports having intermittent episodes of palpitations throughout the afternoon. Pt expresses's the symptoms come and go and she sts "when it happens, its like my circulation is bad in my legs." Pt denies cardiac HX, SOB or chest pain.

## 2017-02-26 ENCOUNTER — Emergency Department
Admission: EM | Admit: 2017-02-26 | Discharge: 2017-02-26 | Disposition: A | Discharge status: Home / Self Care | Payer: Medicare HMO | Attending: Emergency Medicine | Admitting: Emergency Medicine

## 2017-02-26 DIAGNOSIS — R002 Palpitations: Secondary | ICD-10-CM

## 2017-02-26 NOTE — ED Provider Notes (Signed)
[facility] Emergency Department Provider Note  Time seen: 3:47 AM  I have reviewed the triage vital signs and the nursing notes.   HISTORY  Chief Complaint Palpitations    HPI Megan Perez is a 71 y.o. female with a past medical history of anxiety, COPD, gastric reflux, presents the emergency department for palpitations. According to the patient she states she was lying in bed tonight and she was feeling her heartbeat is irregular/palpitating in her chest. Patient became concerned so she came to the emergency department. Here she denies any chest pain, trouble breathing, nausea, vomiting, diaphoresis. She states lower extremity edema but unchanged from baseline. Denies any leg pain. States she was also getting a tingling sensation in her toes tonight.  Past Medical History:  Diagnosis Date  . Anxiety    PT ONLY USES ALBUTEROL INHALER DURING PANIC ATTACK -PT DENIES ASTHMA OR COPD  . Bronchitis   . GERD (gastroesophageal reflux disease)   . Hypertension   . OA (osteoarthritis)    knee  . Prediabetes     Patient Active Problem List   Diagnosis Date Noted  . Mild intermittent asthma without complication 01/16/2017  . Glucose intolerance (impaired glucose tolerance) 02/16/2016  . Status post total right knee replacement 09/01/2014  . Allergic rhinitis 01/21/2014  . Morbid obesity with BMI of 40.0-44.9, adult (HCC) 04/08/2012  . Arthritis 11/24/2011  . Essential hypertension 08/19/2010  . Venous (peripheral) insufficiency 08/19/2010  . GERD 08/19/2010    Past Surgical History:  Procedure Laterality Date  . ABDOMINAL HYSTERECTOMY    . CHOLECYSTECTOMY    . HEEL SPUR SURGERY    . KNEE SURGERY    . REPLACEMENT TOTAL KNEE     left  . SHOULDER ARTHROSCOPY WITH OPEN ROTATOR CUFF REPAIR Right 09/15/2015   Procedure: SHOULDER ARTHROSCOPY WITH OPEN ROTATOR CUFF REPAIR;  Surgeon: Deeann SaintHoward Miller, MD;  Location: [facility] ORS;  Service: Orthopedics;  Laterality:  Right;  . SHOULDER SURGERY Left     Prior to Admission medications   Medication Sig Start Date End Date Taking? Authorizing Provider  albuterol (PROVENTIL HFA;VENTOLIN HFA) 108 (90 Base) MCG/ACT inhaler Inhale 2 puffs into the lungs every 6 (six) hours as needed for wheezing or shortness of breath. 01/16/17   Ronnald NianLalonde, John C, MD  fluticasone (FLONASE) 50 MCG/ACT nasal spray Place 2 sprays into both nostrils daily. 11/25/15   Ronnald NianLalonde, John C, MD  furosemide (LASIX) 40 MG tablet TAKE 1 TABLET(40 MG) BY MOUTH DAILY 02/19/17   Ronnald NianLalonde, John C, MD  lisinopril (PRINIVIL,ZESTRIL) 20 MG tablet TAKE 1 TABLET(20 MG) BY MOUTH DAILY 01/16/17   Ronnald NianLalonde, John C, MD  MELATONIN PO Take by mouth.    [provider]  meloxicam (MOBIC) 15 MG tablet Take 1 tablet (15 mg total) by mouth daily. 09/15/15   Deeann SaintMiller, Howard, MD  omeprazole (PRILOSEC) 20 MG capsule TAKE 1 CAPSULE BY MOUTH EVERY DAY 01/16/17   Ronnald NianLalonde, John C, MD    Allergies  Allergen Reactions  . Prednisone Nausea And Vomiting  . Oxycodone Rash    Family History  Problem Relation Age of Onset  . Breast cancer Neg Hx     Social History Social History  Substance Use Topics  . Smoking status: Never Smoker  . Smokeless tobacco: Never Used  . Alcohol use No    Review of Systems Constitutional: Negative for fever. Cardiovascular: Negative for chest pain.Intermittent palpitations. Respiratory: Negative for shortness of breath. Gastrointestinal: Negative for abdominal pain negative for  nausea. Genitourinary: Negative for dysuria. Musculoskeletal: Unchanged leg swelling. No tenderness. Skin: Negative for rash. Neurological: Negative for headache All other ROS negative  ____________________________________________   PHYSICAL EXAM:  VITAL SIGNS: ED Triage Vitals [02/25/17 2319]  Enc Vitals Group     BP (!) 147/102     Pulse Rate 86     Resp 16     Temp 97.7 F (36.5 C)     Temp Source Oral     SpO2 100 %     Weight 276 lb  (125.2 kg)     Height 5\' 7"  (1.702 m)     Head Circumference      Peak Flow      Pain Score      Pain Loc      Pain Edu?      Excl. in GC?     Constitutional: Alert and oriented. Well appearing and in no distress. Eyes: Normal exam ENT   Head: Normocephalic and atraumatic.   Mouth/Throat: Mucous membranes are moist. Cardiovascular: Normal rate, regular rhythm. No murmur Respiratory: Normal respiratory effort without tachypnea nor retractions. Breath sounds are clear Gastrointestinal: Soft and nontender. No distention.   Musculoskeletal: Nontender with normal range of motion in all extremities. Mild to moderate pedal edema bilaterally. Nonpitting. No calf tenderness. Neurologic:  Normal speech and language. No gross focal neurologic deficits  Skin:  Skin is warm, dry and intact.  Psychiatric: Mood and affect are normal. Speech and behavior are normal.   ____________________________________________    EKG  EKG reviewed and interpreted by myself shows normal sinus rhythm at 89 bpm, narrow QRS, normal axis, normal intervals, no concerning ST changes. Overall reassuring EKG.  ____________________________________________    RADIOLOGY  Chest x-ray negative, irregular right humeral head noted. No right shoulder pain per patient.  ____________________________________________   INITIAL IMPRESSION / ASSESSMENT AND PLAN / ED COURSE  Pertinent labs & imaging results that were available during my care of the patient were reviewed by me and considered in my medical decision making (see chart for details).  Patient presents the emergency department with palpitations. Patient's EKG is very reassuring. On the monitor the patient will occasionally have a premature ventricular complex. No chest pain or shortness of breath. Patient's labs are largely normal with a negative troponin. I discussed with the patient increasing her sleep decreasing stimulant use such as caffeine and  decreasing alcohol intake. I also discussed cardiology follow-up for a Holter monitor she continues to be symptomatic over the next 2-3 days after making these changes. Patient agreeable to plan. Discussed my normal return precautions for any chest pain or trouble breathing.  ____________________________________________   FINAL CLINICAL IMPRESSION(S) / ED DIAGNOSES  Palpitations    Minna Antis, MD 02/26/17 [phone]

## 2017-02-26 NOTE — ED Notes (Signed)
ED Provider at bedside. 

## 2017-03-05 DIAGNOSIS — I10 Essential (primary) hypertension: Secondary | ICD-10-CM | POA: Diagnosis not present

## 2017-03-05 DIAGNOSIS — K219 Gastro-esophageal reflux disease without esophagitis: Secondary | ICD-10-CM | POA: Diagnosis not present

## 2017-03-05 DIAGNOSIS — R600 Localized edema: Secondary | ICD-10-CM | POA: Diagnosis not present

## 2017-03-05 DIAGNOSIS — R002 Palpitations: Secondary | ICD-10-CM | POA: Diagnosis not present

## 2017-03-05 DIAGNOSIS — R0602 Shortness of breath: Secondary | ICD-10-CM | POA: Diagnosis not present

## 2017-03-08 ENCOUNTER — Encounter: Payer: Self-pay | Admitting: Nurse Practitioner

## 2017-03-08 ENCOUNTER — Ambulatory Visit (INDEPENDENT_AMBULATORY_CARE_PROVIDER_SITE_OTHER): Payer: Medicare HMO | Admitting: Nurse Practitioner

## 2017-03-08 VITALS — BP 126/63 | HR 66 | Temp 97.7°F | Ht 67.0 in | Wt 273.6 lb

## 2017-03-08 DIAGNOSIS — Z7689 Persons encountering health services in other specified circumstances: Secondary | ICD-10-CM | POA: Diagnosis not present

## 2017-03-08 DIAGNOSIS — I10 Essential (primary) hypertension: Secondary | ICD-10-CM | POA: Diagnosis not present

## 2017-03-08 DIAGNOSIS — M7989 Other specified soft tissue disorders: Secondary | ICD-10-CM

## 2017-03-08 DIAGNOSIS — J4520 Mild intermittent asthma, uncomplicated: Secondary | ICD-10-CM | POA: Diagnosis not present

## 2017-03-08 DIAGNOSIS — I872 Venous insufficiency (chronic) (peripheral): Secondary | ICD-10-CM | POA: Diagnosis not present

## 2017-03-08 DIAGNOSIS — Z96651 Presence of right artificial knee joint: Secondary | ICD-10-CM | POA: Insufficient documentation

## 2017-03-08 MED ORDER — ALBUTEROL SULFATE HFA 108 (90 BASE) MCG/ACT IN AERS: 2.0000 | INHALATION_SPRAY | Freq: Four times a day (QID) | RESPIRATORY_TRACT | 1 refills | Status: DC | PRN

## 2017-03-08 NOTE — Progress Notes (Signed)
Subjective:    Patient ID: Megan HivesMary A Speiser, female    DOB: 09-02-1946, 71 y.o.   MRN: [id number]  Megan Perez is a 71 y.o. female presenting on 03/08/2017 for Establish Care   HPI  Establish Care New Provider Pt last seen by PCP < 1 years ago.  Obtain records from Care Everywhere for Dr. Susann GivensLalonde.    Cardiology - Hypertension and Leg Swelling Has seen NP at Dr. Milta DeitersKhan's office is feeling much better w/ new HCTZ diuretic. HCTZ 12.5 and metoprolol-XL 50 mg once daily are new.  Pt is also taking furosemide 40 mg once daily and lisinopril 20 mg once daily.   Needs replacement form for handicap placard - paperwork was completed today for 6 months for inability to walk 200 ft w/o shortness of breath.  Past Medical History:  Diagnosis Date  . Anxiety   . Asthma    reactive airway and shortness of breath w/ exertion in hot temperatures  . Bronchitis   . GERD (gastroesophageal reflux disease)   . Hypertension   . Morbid obesity with BMI of 40.0-44.9, adult (HCC) 04/08/2012  . OA (osteoarthritis)    knee  . Osteoporosis   . Prediabetes   . Venous (peripheral) insufficiency 08/19/2010   Qualifier: Diagnosis of  By: Laural BenesJohnson MD, [place]     Past Surgical History:  Procedure Laterality Date  . ABDOMINAL HYSTERECTOMY    . CHOLECYSTECTOMY    . HEEL SPUR SURGERY    . REPLACEMENT TOTAL KNEE     left  . REPLACEMENT TOTAL KNEE Right   . SHOULDER ARTHROSCOPY WITH OPEN ROTATOR CUFF REPAIR Right 09/15/2015   Procedure: SHOULDER ARTHROSCOPY WITH OPEN ROTATOR CUFF REPAIR;  Surgeon: Deeann SaintHoward Miller, MD;  Location: [facility] ORS;  Service: Orthopedics;  Laterality: Right;  . SHOULDER SURGERY Left    Family History  Problem Relation Age of Onset  . Adopted: Yes  . Heart disease Father   . Subarachnoid hemorrhage Mother   . Hypertension Mother   . Subarachnoid hemorrhage Sister   . Hypertension Sister   . Hypertension Sister   . Hypertension Sister   . Healthy Sister   . Breast cancer Neg Hx     Family history above is for biological family members.  Social History  Substance Use Topics  . Smoking status: Never Smoker  . Smokeless tobacco: Never Used  . Alcohol use No    Review of Systems  Constitutional: Negative.   HENT: Negative.   Eyes: Negative.   Respiratory: Positive for shortness of breath.   Cardiovascular: Positive for leg swelling.  Gastrointestinal: Positive for diarrhea.       S/p cholecystectomy - diarrhea w/ fatty meals  Endocrine: Negative.   Genitourinary: Negative.   Musculoskeletal: Positive for arthralgias.  Skin: Negative.   Allergic/Immunologic: Negative.   Neurological: Negative.   Hematological: Negative.   Psychiatric/Behavioral: Negative.    Per HPI unless specifically indicated above     Objective:    BP 126/63 (BP Location: Right Arm, Patient Position: Sitting, Cuff Size: Large)   Pulse 66   Temp 97.7 F (36.5 C) (Oral)   Ht 5\' 7"  (1.702 m)   Wt 273 lb 9.6 oz (124.1 kg)   SpO2 99%   BMI 42.85 kg/m    Wt Readings from Last 3 Encounters:  03/08/17 273 lb 9.6 oz (124.1 kg)  02/25/17 276 lb (125.2 kg)  01/16/17 276 lb (125.2 kg)    Physical Exam  Constitutional: She is oriented to  person, place, and time. She appears well-developed and well-nourished. No distress.  HENT:  Head: Normocephalic and atraumatic.  Cardiovascular: Normal rate, regular rhythm, normal heart sounds and intact distal pulses.   Pulmonary/Chest: Effort normal and breath sounds normal. No respiratory distress.  Musculoskeletal: She exhibits edema.  BLE +1 pitting edema of ankles, feet, lower leg  Neurological: She is alert and oriented to person, place, and time.  Skin: Skin is warm and dry.  Psychiatric: She has a normal mood and affect. Her behavior is normal. Judgment and thought content normal.  Vitals reviewed.   Results for orders placed or performed during the hospital encounter of 02/26/17  Basic metabolic panel  Result Value Ref Range    Sodium 142 135 - 145 mmol/L   Potassium 3.8 3.5 - 5.1 mmol/L   Chloride 108 101 - 111 mmol/L   CO2 27 22 - 32 mmol/L   Glucose, Bld 109 (H) 65 - 99 mg/dL   BUN 23 (H) 6 - 20 mg/dL   Creatinine, Ser 1.61 0.44 - 1.00 mg/dL   Calcium 9.0 8.9 - 09.6 mg/dL   GFR calc non Af Amer >60 >60 mL/min   GFR calc Af Amer >60 >60 mL/min   Anion gap 7 5 - 15  CBC  Result Value Ref Range   WBC 6.2 3.6 - 11.0 K/uL   RBC 3.85 3.80 - 5.20 MIL/uL   Hemoglobin 12.0 12.0 - 16.0 g/dL   HCT 04.5 40.9 - 81.1 %   MCV 92.8 80.0 - 100.0 fL   MCH 31.1 26.0 - 34.0 pg   MCHC 33.5 32.0 - 36.0 g/dL   RDW 91.4 78.2 - 95.6 %   Platelets 299 150 - 440 K/uL  Troponin I  Result Value Ref Range   Troponin I <0.03 <0.03 ng/mL      Assessment & Plan:   Problem List Items Addressed This Visit      Cardiovascular and Mediastinum   Essential hypertension    Improved hypertension on additional medications.  Pt w/ improving symptoms and no side effects.  Lingering edema.  Plan: 1. For next 2 days, take one extra dose furosemide 40 mg in afternoon then resume furosemide 40 mg once daily and await Cardiology recommendations. 2. Continue taking lisinopril 20 mg, hydrochlorothiazide 12.5 mg,Toprol-XL 50 mg, and furosemide 40 mg once daily. 3. Increase physical activity. 4. Start adhering to DASH eating plan. 5. Follow up w/ Cardiology and w/ me in 2 months.      Relevant Medications   aspirin EC 81 MG tablet   metoprolol succinate (TOPROL-XL) 50 MG 24 hr tablet   hydrochlorothiazide (HYDRODIURIL) 12.5 MG tablet   Venous (peripheral) insufficiency    Stable.  Contributing factor to lower leg swelling.    Plan: 1. Optimize diuretics and evaluate for heart failure as planned by Cards. 2. Recommend compression socks in future if needed and/or vein and vascular referral.      Relevant Medications   aspirin EC 81 MG tablet   metoprolol succinate (TOPROL-XL) 50 MG 24 hr tablet   hydrochlorothiazide (HYDRODIURIL)  12.5 MG tablet     Respiratory   Mild intermittent asthma without complication    Pt needs refill of albuterol inhaler. Has not required use of this medication recently, but does use it more in hot weather if shortness of breath does not resolve after rest.  Plan: 1. Use albuterol inhaler 2 puffs up to every 6 hours prn shortness of breath. 2. Follow up as  needed and in 1 year      Relevant Medications   albuterol (PROVENTIL HFA;VENTOLIN HFA) 108 (90 Base) MCG/ACT inhaler    Other Visit Diagnoses    Encounter to establish care    -  Primary Pt w/ recent ED visit.  Desires to establish care w/ new PCP.  Recommend medicare wellness visit w/ [name] at next schedule opening.      Leg swelling     See HTN and PVD above.      Meds ordered this encounter  Medications  . aspirin EC 81 MG tablet    Sig: Take 81 mg by mouth daily.  . metoprolol succinate (TOPROL-XL) 50 MG 24 hr tablet    Sig: Take 50 mg by mouth daily. Take with or immediately following a meal.  . hydrochlorothiazide (HYDRODIURIL) 12.5 MG tablet    Sig: Take 12.5 mg by mouth daily.  [name] albuterol (PROVENTIL HFA;VENTOLIN HFA) 108 (90 Base) MCG/ACT inhaler    Sig: Inhale 2 puffs into the lungs every 6 (six) hours as needed for wheezing or shortness of breath.    Dispense:  1 Inhaler    Refill:  1    Product selection permitted for insurance preferred brand.      Follow up plan: Return in about 2 months (around 05/08/2017) for blood pressure, glucose. AND a medicare wellness visit with [name].   [name], DNP, AGPCNP-BC Adult Gerontology Primary Care Nurse Practitioner [facility] 03/08/2017, 1:11 PM

## 2017-03-08 NOTE — Assessment & Plan Note (Signed)
Pt needs refill of albuterol inhaler. Has not required use of this medication recently, but does use it more in hot weather if shortness of breath does not resolve after rest.  Plan: 1. Use albuterol inhaler 2 puffs up to every 6 hours prn shortness of breath. 2. Follow up as needed and in 1 year

## 2017-03-08 NOTE — Patient Instructions (Addendum)
Megan Perez, Thank you for coming in to clinic today.  1. For your extra fluid: - take one extra furosemide today around 5 pm and tomorrow around 4 pm.  Then resume your once daily dose and wait for cardiology. - also tell Cardiology that you are taking meloxicam.  This medication can cause you to retain fluid.  Try only taking 1/2 tablet each day.  If this still works for your pain with an occasional acetaminophen or Tylenol 1,000 mg three times daily (equal to or less than 3,000 mg per day total doses combined), then we will change your prescription for your next fill.    Please schedule a follow-up appointment with Megan Perez, AGNP to Return in about 2 months (around 05/08/2017) for blood pressure, glucose. AND a medicare wellness visit with Megan Perez.  If you have any other questions or concerns, please feel free to call the clinic or send a message through MyChart. You may also schedule an earlier appointment if necessary.  Megan McardleLauren [name], DNP, AGNP-BC Adult Gerontology Nurse Practitioner [facility]    DASH Eating Plan DASH stands for "Dietary Approaches to Stop Hypertension." The DASH eating plan is a healthy eating plan that has been shown to reduce high blood pressure (hypertension). It may also reduce your risk for type 2 diabetes, heart disease, and stroke. The DASH eating plan may also help with weight loss. What are tips for following this plan? General guidelines  Avoid eating more than 2,300 mg (milligrams) of salt (sodium) a day. If you have hypertension, you may need to reduce your sodium intake to 1,500 mg a day.  Limit alcohol intake to no more than 1 drink a day for nonpregnant women and 2 drinks a day for men. One drink equals 12 oz of beer, 5 oz of wine, or 1 oz of hard liquor.  Work with your health care provider to maintain a healthy body weight or to lose weight. Ask what an ideal weight is for you.  Get at least 30 minutes of exercise that  causes your heart to beat faster (aerobic exercise) most days of the week. Activities may include walking, swimming, or biking.  Work with your health care provider or diet and nutrition specialist (dietitian) to adjust your eating plan to your individual calorie needs. Reading food labels  Check food labels for the amount of sodium per serving. Choose foods with less than 5 percent of the Daily Value of sodium. Generally, foods with less than 300 mg of sodium per serving fit into this eating plan.  To find whole grains, look for the word "whole" as the first word in the ingredient list. Shopping  Buy products labeled as "low-sodium" or "no salt added."  Buy fresh foods. Avoid canned foods and premade or frozen meals. Cooking  Avoid adding salt when cooking. Use salt-free seasonings or herbs instead of table salt or sea salt. Check with your health care provider or pharmacist before using salt substitutes.  Do not fry foods. Cook foods using healthy methods such as baking, boiling, grilling, and broiling instead.  Cook with heart-healthy oils, such as olive, canola, soybean, or sunflower oil. Meal planning   Eat a balanced diet that includes: ? 5 or more servings of fruits and vegetables each day. At each meal, try to fill half of your plate with fruits and vegetables. ? Up to 6-8 servings of whole grains each day. ? Less than 6 oz of lean meat, poultry, or fish each day.  A 3-oz serving of meat is about the same size as a deck of cards. One egg equals 1 oz. ? 2 servings of low-fat dairy each day. ? A serving of nuts, seeds, or beans 5 times each week. ? Heart-healthy fats. Healthy fats called Omega-3 fatty acids are found in foods such as flaxseeds and coldwater fish, like sardines, salmon, and mackerel.  Limit how much you eat of the following: ? Canned or prepackaged foods. ? Food that is high in trans fat, such as fried foods. ? Food that is high in saturated fat, such as fatty  meat. ? Sweets, desserts, sugary drinks, and other foods with added sugar. ? Full-fat dairy products.  Do not salt foods before eating.  Try to eat at least 2 vegetarian meals each week.  Eat more home-cooked food and less restaurant, buffet, and fast food.  When eating at a restaurant, ask that your food be prepared with less salt or no salt, if possible. What foods are recommended? The items listed may not be a complete list. Talk with your dietitian about what dietary choices are best for you. Grains Whole-grain or whole-wheat bread. Whole-grain or whole-wheat pasta. Brown rice. [place]ena Morrow. Bulgur. Whole-grain and low-sodium cereals. Pita bread. Low-fat, low-sodium crackers. Whole-wheat flour tortillas. Vegetables Fresh or frozen vegetables (raw, steamed, roasted, or grilled). Low-sodium or reduced-sodium tomato and vegetable juice. Low-sodium or reduced-sodium tomato sauce and tomato paste. Low-sodium or reduced-sodium canned vegetables. Fruits All fresh, dried, or frozen fruit. Canned fruit in natural juice (without added sugar). Meat and other protein foods Skinless chicken or [place]. Ground chicken or [place]. Pork with fat trimmed off. Fish and seafood. Egg whites. Dried beans, peas, or lentils. Unsalted nuts, nut butters, and seeds. Unsalted canned beans. Lean cuts of beef with fat trimmed off. Low-sodium, lean deli meat. Dairy Low-fat (1%) or fat-free (skim) milk. Fat-free, low-fat, or reduced-fat cheeses. Nonfat, low-sodium ricotta or cottage cheese. Low-fat or nonfat yogurt. Low-fat, low-sodium cheese. Fats and oils Soft margarine without trans fats. Vegetable oil. Low-fat, reduced-fat, or light mayonnaise and salad dressings (reduced-sodium). Canola, safflower, olive, soybean, and sunflower oils. Avocado. Seasoning and other foods Herbs. Spices. Seasoning mixes without salt. Unsalted popcorn and pretzels. Fat-free sweets. What foods are not recommended? The items listed  may not be a complete list. Talk with your dietitian about what dietary choices are best for you. Grains Baked goods made with fat, such as croissants, muffins, or some breads. Dry pasta or rice meal packs. Vegetables Creamed or fried vegetables. Vegetables in a cheese sauce. Regular canned vegetables (not low-sodium or reduced-sodium). Regular canned tomato sauce and paste (not low-sodium or reduced-sodium). Regular tomato and vegetable juice (not low-sodium or reduced-sodium). Angie Fava. Olives. Fruits Canned fruit in a light or heavy syrup. Fried fruit. Fruit in cream or butter sauce. Meat and other protein foods Fatty cuts of meat. Ribs. Fried meat. Berniece Salines. Sausage. Bologna and other processed lunch meats. Salami. Fatback. Hotdogs. Bratwurst. Salted nuts and seeds. Canned beans with added salt. Canned or smoked fish. Whole eggs or egg yolks. Chicken or [place] with skin. Dairy Whole or 2% milk, cream, and half-and-half. Whole or full-fat cream cheese. Whole-fat or sweetened yogurt. Full-fat cheese. Nondairy creamers. Whipped toppings. Processed cheese and cheese spreads. Fats and oils Butter. Stick margarine. Lard. Shortening. Ghee. Bacon fat. Tropical oils, such as coconut, palm kernel, or palm oil. Seasoning and other foods Salted popcorn and pretzels. Onion salt, garlic salt, seasoned salt, table salt, and sea salt. Worcestershire sauce. Tartar  sauce. Barbecue sauce. Teriyaki sauce. Soy sauce, including reduced-sodium. Steak sauce. Canned and packaged gravies. Fish sauce. Oyster sauce. Cocktail sauce. Horseradish that you find on the shelf. Ketchup. Mustard. Meat flavorings and tenderizers. Bouillon cubes. Hot sauce and Tabasco sauce. Premade or packaged marinades. Premade or packaged taco seasonings. Relishes. Regular salad dressings. Where to find more information:  National Heart, Lung, and [place]: https://wilson-eaton.com/  American Heart Association: www.heart.org Summary  The DASH  eating plan is a healthy eating plan that has been shown to reduce high blood pressure (hypertension). It may also reduce your risk for type 2 diabetes, heart disease, and stroke.  With the DASH eating plan, you should limit salt (sodium) intake to 2,300 mg a day. If you have hypertension, you may need to reduce your sodium intake to 1,500 mg a day.  When on the DASH eating plan, aim to eat more fresh fruits and vegetables, whole grains, lean proteins, low-fat dairy, and heart-healthy fats.  Work with your health care provider or diet and nutrition specialist (dietitian) to adjust your eating plan to your individual calorie needs. This information is not intended to replace advice given to you by your health care provider. Make sure you discuss any questions you have with your health care provider. Document Released: 08/24/2011 Document Revised: 08/28/2016 Document Reviewed: 08/28/2016 Elsevier Interactive Patient Education  2017 [employer].

## 2017-03-08 NOTE — Assessment & Plan Note (Signed)
Stable.  Contributing factor to lower leg swelling.    Plan: 1. Optimize diuretics and evaluate for heart failure as planned by Cards. 2. Recommend compression socks in future if needed and/or vein and vascular referral.

## 2017-03-08 NOTE — Assessment & Plan Note (Signed)
Improved hypertension on additional medications.  Pt w/ improving symptoms and no side effects.  Lingering edema.  Plan: 1. For next 2 days, take one extra dose furosemide 40 mg in afternoon then resume furosemide 40 mg once daily and await Cardiology recommendations. 2. Continue taking lisinopril 20 mg, hydrochlorothiazide 12.5 mg,Toprol-XL 50 mg, and furosemide 40 mg once daily. 3. Increase physical activity. 4. Start adhering to DASH eating plan. 5. Follow up w/ Cardiology and w/ me in 2 months.

## 2017-03-12 DIAGNOSIS — R079 Chest pain, unspecified: Secondary | ICD-10-CM | POA: Diagnosis not present

## 2017-03-12 NOTE — Progress Notes (Signed)
I have reviewed this encounter including the documentation in this note and/or discussed this patient with the provider, Wilhelmina McardleLauren Kennedy, AGPCNP-BC. I am certifying that I agree with the content of this note as supervising physician.  Saralyn PilarAlexander Itzabella Sorrels, DO [facility] 03/12/2017, 6:29 PM

## 2017-03-20 ENCOUNTER — Other Ambulatory Visit: Payer: Self-pay | Admitting: Family Medicine

## 2017-03-23 ENCOUNTER — Other Ambulatory Visit: Payer: Self-pay | Admitting: Family Medicine

## 2017-03-23 NOTE — Telephone Encounter (Signed)
Pt has established to a new Dr. Med denied I did call and pt said she has switched

## 2017-04-02 DIAGNOSIS — R079 Chest pain, unspecified: Secondary | ICD-10-CM | POA: Diagnosis not present

## 2017-04-02 DIAGNOSIS — R002 Palpitations: Secondary | ICD-10-CM | POA: Diagnosis not present

## 2017-04-02 DIAGNOSIS — R0602 Shortness of breath: Secondary | ICD-10-CM | POA: Diagnosis not present

## 2017-04-02 DIAGNOSIS — R55 Syncope and collapse: Secondary | ICD-10-CM | POA: Diagnosis not present

## 2017-04-02 DIAGNOSIS — R609 Edema, unspecified: Secondary | ICD-10-CM | POA: Diagnosis not present

## 2017-04-05 DIAGNOSIS — R9430 Abnormal result of cardiovascular function study, unspecified: Secondary | ICD-10-CM | POA: Diagnosis not present

## 2017-04-05 DIAGNOSIS — R072 Precordial pain: Secondary | ICD-10-CM | POA: Diagnosis not present

## 2017-04-09 ENCOUNTER — Telehealth: Payer: Self-pay | Admitting: Nurse Practitioner

## 2017-04-09 DIAGNOSIS — K219 Gastro-esophageal reflux disease without esophagitis: Secondary | ICD-10-CM

## 2017-04-09 MED ORDER — OMEPRAZOLE 20 MG PO CPDR
DELAYED_RELEASE_CAPSULE | ORAL | 1 refills | Status: DC
Start: 2017-04-09 — End: 2017-04-11

## 2017-04-09 NOTE — Telephone Encounter (Signed)
Pt needs a refill on omeprazole sent to [facility] [place].  Her call back number [phone]

## 2017-04-11 ENCOUNTER — Other Ambulatory Visit: Payer: Self-pay

## 2017-04-11 DIAGNOSIS — K219 Gastro-esophageal reflux disease without esophagitis: Secondary | ICD-10-CM

## 2017-04-11 MED ORDER — OMEPRAZOLE 20 MG PO CPDR: DELAYED_RELEASE_CAPSULE | ORAL | 1 refills | Status: DC

## 2017-04-12 DIAGNOSIS — I10 Essential (primary) hypertension: Secondary | ICD-10-CM | POA: Diagnosis not present

## 2017-04-12 DIAGNOSIS — I2510 Atherosclerotic heart disease of native coronary artery without angina pectoris: Secondary | ICD-10-CM | POA: Diagnosis not present

## 2017-04-12 DIAGNOSIS — R1030 Lower abdominal pain, unspecified: Secondary | ICD-10-CM | POA: Diagnosis not present

## 2017-04-12 DIAGNOSIS — K210 Gastro-esophageal reflux disease with esophagitis: Secondary | ICD-10-CM | POA: Diagnosis not present

## 2017-04-12 DIAGNOSIS — R079 Chest pain, unspecified: Secondary | ICD-10-CM | POA: Diagnosis not present

## 2017-04-12 LAB — LIPID PANEL
Cholesterol: 202 — AB (ref 0–200)
HDL: 60 (ref 35–70)
LDL Cholesterol: 121
Triglycerides: 105 (ref 40–160)

## 2017-04-13 LAB — HEPATIC FUNCTION PANEL
ALT: 8 (ref 7–35)
AST: 6 — AB (ref 13–35)

## 2017-04-13 LAB — BASIC METABOLIC PANEL
BUN: 21 (ref 4–21)
Glucose: 132

## 2017-04-17 ENCOUNTER — Ambulatory Visit (INDEPENDENT_AMBULATORY_CARE_PROVIDER_SITE_OTHER): Payer: Medicare HMO

## 2017-04-17 VITALS — BP 110/70 | HR 78 | Temp 97.9°F | Resp 16 | Ht 67.0 in | Wt 271.8 lb

## 2017-04-17 DIAGNOSIS — Z1211 Encounter for screening for malignant neoplasm of colon: Secondary | ICD-10-CM

## 2017-04-17 DIAGNOSIS — Z0000 Encounter for general adult medical examination without abnormal findings: Secondary | ICD-10-CM | POA: Diagnosis not present

## 2017-04-17 NOTE — Progress Notes (Signed)
Subjective:   Mee HivesMary A Rugg is a 71 y.o. female who presents for Medicare Annual (Subsequent) preventive examination.  Review of Systems:  Cardiac Risk Factors include: advanced age (>2255men, 61>65 women);hypertension;obesity (BMI >30kg/m2)     Objective:     Vitals: BP 110/70 (BP Location: Left Arm, Patient Position: Sitting)   Pulse 78   Temp 97.9 F (36.6 C)   Resp 16   Ht 5\' 7"  (1.702 m)   Wt 271 lb 12.8 oz (123.3 kg)   BMI 42.57 kg/m   Body mass index is 42.57 kg/m.   Tobacco History  Smoking Status  . Never Smoker  Smokeless Tobacco  . Never Used     Counseling given: Not Answered   Past Medical History:  Diagnosis Date  . Anxiety   . Asthma    reactive airway and shortness of breath w/ exertion in hot temperatures  . Bronchitis   . GERD (gastroesophageal reflux disease)   . Hypertension   . Morbid obesity with BMI of 40.0-44.9, adult (HCC) 04/08/2012  . OA (osteoarthritis)    knee  . Osteoporosis   . Prediabetes   . Venous (peripheral) insufficiency 08/19/2010   Qualifier: Diagnosis of  By: Laural BenesJohnson MD, [place]     Past Surgical History:  Procedure Laterality Date  . ABDOMINAL HYSTERECTOMY    . CHOLECYSTECTOMY    . HEEL SPUR SURGERY    . REPLACEMENT TOTAL KNEE     left  . REPLACEMENT TOTAL KNEE Right   . SHOULDER ARTHROSCOPY WITH OPEN ROTATOR CUFF REPAIR Right 09/15/2015   Procedure: SHOULDER ARTHROSCOPY WITH OPEN ROTATOR CUFF REPAIR;  Surgeon: Deeann SaintHoward Miller, MD;  Location: [facility] ORS;  Service: Orthopedics;  Laterality: Right;  . SHOULDER SURGERY Left    Family History  Problem Relation Age of Onset  . Adopted: Yes  . Heart disease Father   . Subarachnoid hemorrhage Mother   . Hypertension Mother   . Subarachnoid hemorrhage Sister   . Hypertension Sister   . Hypertension Sister   . Hypertension Sister   . Healthy Sister   . Breast cancer Neg Hx    History  Sexual Activity  . Sexual activity: Yes  . Birth control/ protection: Surgical     Outpatient Encounter Prescriptions as of 04/17/2017  Medication Sig  . acetaminophen (TYLENOL) 500 MG tablet Take 500 mg by mouth every 6 (six) hours as needed.  [name]. albuterol (PROVENTIL HFA;VENTOLIN HFA) 108 (90 Base) MCG/ACT inhaler Inhale 2 puffs into the lungs every 6 (six) hours as needed for wheezing or shortness of breath.  [name]. aspirin EC 81 MG tablet Take 81 mg by mouth daily.  [name]. atorvastatin (LIPITOR) 20 MG tablet Take 20 mg by mouth daily.  . fluticasone (FLONASE) 50 MCG/ACT nasal spray Place 2 sprays into both nostrils daily.  . hydrochlorothiazide (HYDRODIURIL) 12.5 MG tablet Take 12.5 mg by mouth daily.  [name]. lisinopril (PRINIVIL,ZESTRIL) 20 MG tablet TAKE 1 TABLET(20 MG) BY MOUTH DAILY  . meloxicam (MOBIC) 15 MG tablet Take 1 tablet (15 mg total) by mouth daily. (Patient taking differently: Take 15 mg by mouth daily. Take 1/2 tablet daily)  . metoprolol succinate (TOPROL-XL) 50 MG 24 hr tablet Take 50 mg by mouth daily. Take with or immediately following a meal.  . pantoprazole (PROTONIX) 40 MG tablet Take 40 mg by mouth daily.  . potassium chloride SA (K-DUR,KLOR-CON) 20 MEQ tablet Take 20 mEq by mouth 2 (two) times daily.  . [DISCONTINUED] furosemide (LASIX) 40 MG tablet  TAKE 1 TABLET(40 MG) BY MOUTH DAILY (Patient not taking: Reported on 04/17/2017)  . [DISCONTINUED] MELATONIN PO Take by mouth.  . [DISCONTINUED] omeprazole (PRILOSEC) 20 MG capsule TAKE 1 CAPSULE BY MOUTH EVERY DAY (Patient not taking: Reported on 04/17/2017)   Facility-Administered Encounter Medications as of 04/17/2017  Medication  . influenza  inactive virus vaccine (FLUZONE/FLUARIX) injection 0.5 mL    Activities of Daily Living In your present state of health, do you have any difficulty performing the following activities: 04/17/2017  Hearing? N  Vision? N  Difficulty concentrating or making decisions? N  Walking or climbing stairs? Y  Dressing or bathing? N  Doing errands, shopping? N  Preparing Food and  eating ? N  Using the Toilet? N  In the past six months, have you accidently leaked urine? N  Comment uses protection just in case  Do you have problems with loss of bowel control? N  Managing your Medications? N  Managing your Finances? N  Housekeeping or managing your Housekeeping? N  Some recent data might be hidden    Patient Care Team: Galen Manila, NP as PCP - General (Nurse Practitioner) Laurier Nancy, MD as Consulting Physician (Cardiology) [name], MD (Specialist)    Assessment:     Exercise Activities and Dietary recommendations Current Exercise Habits: The patient does not participate in regular exercise at present  Goals    . Increase water intake          Recommend drinking at least 3-4 bottles of water a day       Fall Risk Fall Risk  04/17/2017 01/16/2017 02/16/2016 09/22/2013 02/06/2012  Falls in the past year? No No No Yes -  Risk for fall due to : - - - - Impaired balance/gait   Depression Screen PHQ 2/9 Scores 04/17/2017 03/08/2017 01/16/2017 02/16/2016  PHQ - 2 Score 0 1 0 0  PHQ- 9 Score 1 4 - -     Cognitive Function     6CIT Screen 04/17/2017  What Year? 0 points  What month? 0 points  What time? 0 points  Count back from 20 0 points  Months in reverse 0 points  Repeat phrase 0 points  Total Score 0    Immunization History  Administered Date(s) Administered  . Influenza Split 06/28/2012, 06/22/2015, 05/23/2016  . Influenza Whole 08/20/2010  . Influenza, High Dose Seasonal PF 08/06/2013  . Influenza,inj,Quad PF,36+ Mos 12/10/2014  . PPD Test 05/01/2016  . Pneumococcal Conjugate-13 06/22/2015  . Pneumococcal Polysaccharide-23 06/28/2012  . Tdap 09/19/2007   Screening Tests Health Maintenance  Topic Date Due  . DEXA SCAN  03/27/2010  . COLONOSCOPY  12/19/2016  . INFLUENZA VACCINE  04/18/2017  . TETANUS/TDAP  09/18/2017  . MAMMOGRAM  01/26/2019  . Hepatitis C Screening  Completed  . PNA vac Low Risk Adult  Completed       Plan:     I have personally reviewed and addressed the Medicare Annual Wellness questionnaire and have noted the following in the patient's chart:  A. Medical and social history B. Use of alcohol, tobacco or illicit drugs  C. Current medications and supplements D. Functional ability and status E.  Nutritional status F.  Physical activity G. Advance directives H. List of other physicians I.  Hospitalizations, surgeries, and ER visits in previous 12 months J.  Vitals K. Screenings such as hearing and vision if needed, cognitive and depression L. Referrals and appointments   In addition, I have reviewed and discussed  with patient certain preventive protocols, quality metrics, and best practice recommendations. A written personalized care plan for preventive services as well as general preventive health recommendations were provided to patient.   Signed,  Marin Robertsiffany Hill, LPN Nurse Health Advisor   MD Recommendations:

## 2017-04-17 NOTE — Patient Instructions (Addendum)
[name] , Thank you for taking time to come for your Medicare Wellness Visit. I appreciate your ongoing commitment to your health goals. Please review the following plan we discussed and let me know if I can assist you in the future.   Screening recommendations/referrals: Colonoscopy: Due now- referral sent to [facility] Mammogram:Completed 01/25/2017  Bone Density: Due now- declined Recommended yearly ophthalmology/optometry visit for glaucoma screening and checkup Recommended yearly dental visit for hygiene and checkup  Vaccinations: Influenza vaccine: up to date, due 05/2017 Pneumococcal vaccine: up to date Tdap vaccine: up to date Shingles vaccine: due, check with your insurance company for coverage  Advanced directives: Advance directive discussed with you today. I have provided a copy for you to complete at home and have notarized. Once this is complete please bring a copy in to our office so we can scan it into your chart.  Conditions/risks identified: Recommend drinking at least 3-4 bottles of water a day   Next appointment: Follow up on 05/08/2017 at 8:00am with Elly ModenaLauren Kennedy,NP. Follow up in one year for your annual wellness exam.    Preventive Care 65 Years and Older, Female Preventive care refers to lifestyle choices and visits with your health care provider that can promote health and wellness. What does preventive care include?  A yearly physical exam. This is also called an annual well check.  Dental exams once or twice a year.  Routine eye exams. Ask your health care provider how often you should have your eyes checked.  Personal lifestyle choices, including:  Daily care of your teeth and gums.  Regular physical activity.  Eating a healthy diet.  Avoiding tobacco and drug use.  Limiting alcohol use.  Practicing safe sex.  Taking low-dose aspirin every day.  Taking vitamin and mineral supplements as recommended by your health care provider. What  happens during an annual well check? The services and screenings done by your health care provider during your annual well check will depend on your age, overall health, lifestyle risk factors, and family history of disease. Counseling  Your health care provider may ask you questions about your:  Alcohol use.  Tobacco use.  Drug use.  Emotional well-being.  Home and relationship well-being.  Sexual activity.  Eating habits.  History of falls.  Memory and ability to understand (cognition).  Work and work Astronomerenvironment.  Reproductive health. Screening  You may have the following tests or measurements:  Height, weight, and BMI.  Blood pressure.  Lipid and cholesterol levels. These may be checked every 5 years, or more frequently if you are over 72 years old.  Skin check.  Lung cancer screening. You may have this screening every year starting at age 72 if you have a 30-pack-year history of smoking and currently smoke or have quit within the past 15 years.  Fecal occult blood test (FOBT) of the stool. You may have this test every year starting at age 72.  Flexible sigmoidoscopy or colonoscopy. You may have a sigmoidoscopy every 5 years or a colonoscopy every 10 years starting at age 72.  Hepatitis C blood test.  Hepatitis B blood test.  Sexually transmitted disease (STD) testing.  Diabetes screening. This is done by checking your blood sugar (glucose) after you have not eaten for a while (fasting). You may have this done every 1-3 years.  Bone density scan. This is done to screen for osteoporosis. You may have this done starting at age 72.  Mammogram. This may be done every 1-2  years. Talk to your health care provider about how often you should have regular mammograms. Talk with your health care provider about your test results, treatment options, and if necessary, the need for more tests. Vaccines  Your health care provider may recommend certain vaccines, such  as:  Influenza vaccine. This is recommended every year.  Tetanus, diphtheria, and acellular pertussis (Tdap, Td) vaccine. You may need a Td booster every 10 years.  Zoster vaccine. You may need this after age 32.  Pneumococcal 13-valent conjugate (PCV13) vaccine. One dose is recommended after age 33.  Pneumococcal polysaccharide (PPSV23) vaccine. One dose is recommended after age 9. Talk to your health care provider about which screenings and vaccines you need and how often you need them. This information is not intended to replace advice given to you by your health care provider. Make sure you discuss any questions you have with your health care provider. Document Released: 10/01/2015 Document Revised: 05/24/2016 Document Reviewed: 07/06/2015 Elsevier Interactive Patient Education  2017 [place] Prevention in the Home Falls can cause injuries. They can happen to people of all ages. There are many things you can do to make your home safe and to help prevent falls. What can I do on the outside of my home?  Regularly fix the edges of walkways and driveways and fix any cracks.  Remove anything that might make you trip as you walk through a door, such as a raised step or threshold.  Trim any bushes or trees on the path to your home.  Use bright outdoor lighting.  Clear any walking paths of anything that might make someone trip, such as rocks or tools.  Regularly check to see if handrails are loose or broken. Make sure that both sides of any steps have handrails.  Any raised decks and porches should have guardrails on the edges.  Have any leaves, snow, or ice cleared regularly.  Use sand or salt on walking paths during winter.  Clean up any spills in your garage right away. This includes oil or grease spills. What can I do in the bathroom?  Use night lights.  Install grab bars by the toilet and in the tub and shower. Do not use towel bars as grab bars.  Use  non-skid mats or decals in the tub or shower.  If you need to sit down in the shower, use a plastic, non-slip stool.  Keep the floor dry. Clean up any water that spills on the floor as soon as it happens.  Remove soap buildup in the tub or shower regularly.  Attach bath mats securely with double-sided non-slip rug tape.  Do not have throw rugs and other things on the floor that can make you trip. What can I do in the bedroom?  Use night lights.  Make sure that you have a light by your bed that is easy to reach.  Do not use any sheets or blankets that are too big for your bed. They should not hang down onto the floor.  Have a firm chair that has side arms. You can use this for support while you get dressed.  Do not have throw rugs and other things on the floor that can make you trip. What can I do in the kitchen?  Clean up any spills right away.  Avoid walking on wet floors.  Keep items that you use a lot in easy-to-reach places.  If you need to reach something above you, use a strong step  stool that has a grab bar.  Keep electrical cords out of the way.  Do not use floor polish or wax that makes floors slippery. If you must use wax, use non-skid floor wax.  Do not have throw rugs and other things on the floor that can make you trip. What can I do with my stairs?  Do not leave any items on the stairs.  Make sure that there are handrails on both sides of the stairs and use them. Fix handrails that are broken or loose. Make sure that handrails are as long as the stairways.  Check any carpeting to make sure that it is firmly attached to the stairs. Fix any carpet that is loose or worn.  Avoid having throw rugs at the top or bottom of the stairs. If you do have throw rugs, attach them to the floor with carpet tape.  Make sure that you have a light switch at the top of the stairs and the bottom of the stairs. If you do not have them, ask someone to add them for you. What  else can I do to help prevent falls?  Wear shoes that:  Do not have high heels.  Have rubber bottoms.  Are comfortable and fit you well.  Are closed at the toe. Do not wear sandals.  If you use a stepladder:  Make sure that it is fully opened. Do not climb a closed stepladder.  Make sure that both sides of the stepladder are locked into place.  Ask someone to hold it for you, if possible.  Clearly mark and make sure that you can see:  Any grab bars or handrails.  First and last steps.  Where the edge of each step is.  Use tools that help you move around (mobility aids) if they are needed. These include:  Canes.  Walkers.  Scooters.  Crutches.  Turn on the lights when you go into a dark area. Replace any light bulbs as soon as they burn out.  Set up your furniture so you have a clear path. Avoid moving your furniture around.  If any of your floors are uneven, fix them.  If there are any pets around you, be aware of where they are.  Review your medicines with your doctor. Some medicines can make you feel dizzy. This can increase your chance of falling. Ask your doctor what other things that you can do to help prevent falls. This information is not intended to replace advice given to you by your health care provider. Make sure you discuss any questions you have with your health care provider. Document Released: 07/01/2009 Document Revised: 02/10/2016 Document Reviewed: 10/09/2014 Elsevier Interactive Patient Education  2017 [employer]

## 2017-04-20 DIAGNOSIS — G478 Other sleep disorders: Secondary | ICD-10-CM | POA: Diagnosis not present

## 2017-04-24 DIAGNOSIS — R1030 Lower abdominal pain, unspecified: Secondary | ICD-10-CM | POA: Diagnosis not present

## 2017-05-01 ENCOUNTER — Telehealth: Payer: Self-pay | Admitting: Nurse Practitioner

## 2017-05-01 MED ORDER — METOPROLOL SUCCINATE ER 25 MG PO TB24: 25.0000 mg | ORAL_TABLET | Freq: Every day | ORAL | 2 refills | Status: DC

## 2017-05-01 NOTE — Telephone Encounter (Signed)
Pt call to stating she discontinued taking the Metoprolol, because about 1 hour after taking the medication she experience severe dizziness. She states the dizziness is worse with sudden movement.

## 2017-05-01 NOTE — Telephone Encounter (Signed)
[facility] 117 regularly.  Feels dizzy after about 1 hour after taking Metoprolol.  Pt needs to have appointment to evaluate dizziness. Could be other medications or other symptoms.  Would recommend dose change rather than stopping medication. Have sent Metoprolol XL 25 mg to pharmacy.  Pt should not cut tablet in half.  Please continue Metoprolol XL 25 mg starting tomorrow, so I can provide feedback to pt at next visit in 1 week.

## 2017-05-01 NOTE — Telephone Encounter (Signed)
The pt was concern that the Metoprolol dropped her blood pressure to fast and that she might be taking to much bp medication. I informed the pt that 110-117 SBP is normal, but because of her symptoms Lauren is going to lower the medication to 25 MG. She verbalize understanding, no questions or concerns.

## 2017-05-01 NOTE — Telephone Encounter (Signed)
Pt asked for a call.  She has a question about medication [phone]

## 2017-05-02 ENCOUNTER — Telehealth: Payer: Self-pay

## 2017-05-02 NOTE — Telephone Encounter (Signed)
The pt was informed per Dr. Kirtland BouchardK states it's not recommended, but if the pt refuse to pick up the script then she can go ahead and cut the medication in half. She verbalize understanding.

## 2017-05-02 NOTE — Telephone Encounter (Signed)
The pt called complaining about having the Metoprolol 50 MG and not being able to cut them in half. She states the tablet is scored and she doesn't understand why she can't cut it in half. She also states she doesn't have money to waste. Please advise

## 2017-05-03 NOTE — Telephone Encounter (Signed)
Agree w/ advice given.  Will see pt in clinic at next follow up to address BP.

## 2017-05-08 ENCOUNTER — Ambulatory Visit (INDEPENDENT_AMBULATORY_CARE_PROVIDER_SITE_OTHER): Payer: Medicare HMO | Admitting: Nurse Practitioner

## 2017-05-08 VITALS — BP 126/57 | HR 77 | Temp 98.1°F | Ht 67.0 in | Wt 277.0 lb

## 2017-05-08 DIAGNOSIS — R2243 Localized swelling, mass and lump, lower limb, bilateral: Secondary | ICD-10-CM | POA: Diagnosis not present

## 2017-05-08 DIAGNOSIS — I872 Venous insufficiency (chronic) (peripheral): Secondary | ICD-10-CM | POA: Diagnosis not present

## 2017-05-08 DIAGNOSIS — K4090 Unilateral inguinal hernia, without obstruction or gangrene, not specified as recurrent: Secondary | ICD-10-CM

## 2017-05-08 DIAGNOSIS — I10 Essential (primary) hypertension: Secondary | ICD-10-CM

## 2017-05-08 MED ORDER — BD ASSURE BPM/AUTO WRIST CUFF MISC: 1.0000 | Freq: Every day | 0 refills | Status: DC

## 2017-05-08 MED ORDER — PANTOPRAZOLE SODIUM 40 MG PO TBEC: 40.0000 mg | DELAYED_RELEASE_TABLET | Freq: Every day | ORAL | 5 refills | Status: DC

## 2017-05-08 NOTE — Progress Notes (Signed)
Subjective:    Patient ID: Megan Perez, female    DOB: Jun 15, 1946, 71 y.o.   MRN: [id number]  Megan Perez is a 71 y.o. female presenting on 05/08/2017 for Hypertension (pt concern the metoprolol is causing her dizziness )   HPI Hypertension - She is not checking BP at home or outside of clinic.    - Current medications: metoprolol XL 25 mg  (half tablet), lisinopril 20 mg, hydrochlorothiazide 12.5 mg once daily and is tolerating with side effects of dizziness w/ metoprolol  - Pt states she was taken off furosemide by Dr. Welton Flakes and has had recurrent leg swelling. - Last took 1/2 metoprolol XL on Sunday - Feels dizziness when taking this medication.  Has had dizziness while driving, always dizziness and difficulty balancing w/ position changes, some lightheadedness at rest.   - She is otherwise asymptomatic - Pt denies headache, changes in vision, chest tightness/pressure, palpitations, leg swelling, sudden loss of speech or loss of consciousness. - She  reports no regular exercise routine. - Her diet is moderate in salt, moderate in fat, and moderate in carbohydrates.  Cardiology: Adrian Blackwater - [facility] has Test results for prior evaluation.   Hernia - Left groin  Has had sharp pain in left hip area w/ standing and was told she has a hernia.  CT scan done at [facility].  Will request records.   Social History  Substance Use Topics  . Smoking status: Never Smoker  . Smokeless tobacco: Never Used  . Alcohol use No    Review of Systems Per HPI unless specifically indicated above     Objective:    BP (!) 126/57 (BP Location: Right Arm, Patient Position: Sitting, Cuff Size: Large)   Pulse 77   Temp 98.1 F (36.7 C) (Oral)   Ht 5\' 7"  (1.702 m)   Wt 277 lb (125.6 kg)   BMI 43.38 kg/m   Wt Readings from Last 3 Encounters:  05/08/17 277 lb (125.6 kg)  04/17/17 271 lb 12.8 oz (123.3 kg)  03/08/17 273 lb 9.6 oz (124.1 kg)    Physical Exam  General - morbidly  obese, well-appearing, NAD HEENT - Normocephalic, atraumatic, PERRL, EOMI, patent nares w/o congestion, oropharynx clear, MMM Heart - RRR, no murmurs heard Lungs - Clear throughout all lobes, no wheezing, crackles, or rhonchi. Normal work of breathing. Abdomen - soft, NTND, no masses, no hepatosplenomegaly, active bowel sounds.  Left indirect inguinal hernia noted on exam w/o protrusion of bowel or reproduction of pain. Extremeties - non-tender, no edema, cap refill < 2 seconds, peripheral pulses intact +2 bilaterally Skin - warm, dry, no rashes Neuro - awake, alert, oriented x3, normal gait Psych - Normal mood and affect, normal behavior    Results for orders placed or performed during the hospital encounter of 02/26/17  Basic metabolic panel  Result Value Ref Range   Sodium 142 135 - 145 mmol/L   Potassium 3.8 3.5 - 5.1 mmol/L   Chloride 108 101 - 111 mmol/L   CO2 27 22 - 32 mmol/L   Glucose, Bld 109 (H) 65 - 99 mg/dL   BUN 23 (H) 6 - 20 mg/dL   Creatinine, Ser 4.09 0.44 - 1.00 mg/dL   Calcium 9.0 8.9 - 81.1 mg/dL   GFR calc non Af Amer >60 >60 mL/min   GFR calc Af Amer >60 >60 mL/min   Anion gap 7 5 - 15  CBC  Result Value Ref Range   WBC 6.2  3.6 - 11.0 K/uL   RBC 3.85 3.80 - 5.20 MIL/uL   Hemoglobin 12.0 12.0 - 16.0 g/dL   HCT 16.5 79.0 - 38.3 %   MCV 92.8 80.0 - 100.0 fL   MCH 31.1 26.0 - 34.0 pg   MCHC 33.5 32.0 - 36.0 g/dL   RDW 33.8 32.9 - 19.1 %   Platelets 299 150 - 440 K/uL  Troponin I  Result Value Ref Range   Troponin I <0.03 <0.03 ng/mL      Assessment & Plan:   Problem List Items Addressed This Visit      Cardiovascular and Mediastinum   Essential hypertension - Primary    Controlled but w/ side effects of dizziness w/ metoprolol.  Medication dose reduced to 12.5 mg once daily w/ persistent symptoms and worsening leg swelling.  Leg swelling complicated by venous insufficiency and not wearing compression socks.  Plan: 1. STOP taking Toprol-XL. 2.  Continue taking lisinopril 20 mg, hydrochlorothiazide 12.5 mg once daily.   3. Increase physical activity. 4. Start adhering to DASH eating plan. 5. Follow up w/ Cardiology about furosemide and leg swelling.  Consider vein and vascular referral.   6. Follow up w/ me in 3 months.      Relevant Medications   pravastatin (PRAVACHOL) 10 MG tablet   Venous (peripheral) insufficiency    Worsening lower leg swelling off furosemide.  PVD vs cardiac vs renal cause.  Prior kidney function normal. Test results guiding Cardiology decisions not available for review.  Request records.    Plan: 1. Evaluate for heart failure as planned by Cards - pt w/o results of these tests.   2. Recommend compression socks in future if needed and/or vein and vascular referral. 3. Consider resuming furosemide, increasing HCTZ or utilizing chlorthalidone for fluid status maintenance. 4. Follow up as needed.      Relevant Medications   pravastatin (PRAVACHOL) 10 MG tablet     Other   Inguinal hernia, left    Indirect inguinal hernia - LEFT.  Abdominal pain resolved and not present on exam today.  Pt requests advice on management.  [facility] scan obtained at [facility] - no records available.  Plan: 1. Consider Gen Surgery referral if return of symptoms. 2. Reviewed precautions for lifting.  Engage core muscles.  3. Reviewed s/sx of worsening hernia. 4. Follow up as needed.       Other Visit Diagnoses    Localized swelling of both lower legs       See AP for venous insufficiency   Relevant Orders   Compression stockings      Meds ordered this encounter  Medications  . pravastatin (PRAVACHOL) 10 MG tablet     Follow up plan: Return in about 3 months (around 08/08/2017) for Blood Pressure, GERD.   [name], DNP, AGPCNP-BC Adult Gerontology Primary Care Nurse Practitioner [facility] 05/08/2017, 8:16 AM

## 2017-05-08 NOTE — Patient Instructions (Addendum)
Megan Perez, Thank you for coming in to clinic today.  1. For your leg swelling: - I recommend compression socks.  You can take your prescription to [facility] [address] [place], [place], [place] [address].  Ask them about medicare insurance coverage for diagnosis of lower leg swelling and hypertension.   2. HOLD (do not take) your metoprolol until you see Dr. Welton Flakes or I call you with different instructions.  Your BP is well controlled today.   - If you start having palpitations call either me or Dr. Welton Flakes - you need a different medicine for those.  That's what your metoprolol was for.   3. Heartburn/ GERD: - Take one tablet pantoprazole once daily.   4. For your hernia: -  Use your belly muscles when lifting to protect your hernia.  It is very mild.  Also watch for changes in bowel habits and increased pain.  If you see a bulge, call me.  Please schedule a follow-up appointment with [name], AGNP. Return in about 3 months (around 08/08/2017) for Blood Pressure, GERD.   If you have any other questions or concerns, please feel free to call the clinic or send a message through MyChart. You may also schedule an earlier appointment if necessary.  You will receive a survey after today's visit either digitally by e-mail or paper by [employer]. Your experiences and feedback matter to [place].  Please respond so we know how we are doing as we provide care for you.   [name], DNP, AGNP-BC Adult Gerontology Nurse Practitioner [facility], [facility]

## 2017-05-11 ENCOUNTER — Encounter: Payer: Self-pay | Admitting: Nurse Practitioner

## 2017-05-11 DIAGNOSIS — K4090 Unilateral inguinal hernia, without obstruction or gangrene, not specified as recurrent: Secondary | ICD-10-CM | POA: Insufficient documentation

## 2017-05-11 NOTE — Progress Notes (Signed)
I have reviewed this encounter including the documentation in this note and/or discussed this patient with the provider, Lauren Kennedy, AGPCNP-BC. I am certifying that I agree with the content of this note as supervising physician.  Chloe Miyoshi, DO [facility] 05/11/2017, 2:40 PM 

## 2017-05-11 NOTE — Assessment & Plan Note (Signed)
Indirect inguinal hernia - LEFT.  Abdominal pain resolved and not present on exam today.  Pt requests advice on management.  CT scan obtained at [facility] - no records available.  Plan: 1. Consider Gen Surgery referral if return of symptoms. 2. Reviewed precautions for lifting.  Engage core muscles.  3. Reviewed s/sx of worsening hernia. 4. Follow up as needed.

## 2017-05-11 NOTE — Assessment & Plan Note (Signed)
Controlled but w/ side effects of dizziness w/ metoprolol.  Medication dose reduced to 12.5 mg once daily w/ persistent symptoms and worsening leg swelling.  Leg swelling complicated by venous insufficiency and not wearing compression socks.  Plan: 1. STOP taking Toprol-XL. 2. Continue taking lisinopril 20 mg, hydrochlorothiazide 12.5 mg once daily.   3. Increase physical activity. 4. Start adhering to DASH eating plan. 5. Follow up w/ Cardiology about furosemide and leg swelling.  Consider vein and vascular referral.   6. Follow up w/ me in 3 months.

## 2017-05-11 NOTE — Assessment & Plan Note (Signed)
Worsening lower leg swelling off furosemide.  PVD vs cardiac vs renal cause.  Prior kidney function normal. Test results guiding Cardiology decisions not available for review.  Request records.    Plan: 1. Evaluate for heart failure as planned by Cards - pt w/o results of these tests.   2. Recommend compression socks in future if needed and/or vein and vascular referral. 3. Consider resuming furosemide, increasing HCTZ or utilizing chlorthalidone for fluid status maintenance. 4. Follow up as needed.

## 2017-05-14 DIAGNOSIS — R002 Palpitations: Secondary | ICD-10-CM | POA: Diagnosis not present

## 2017-05-14 DIAGNOSIS — I10 Essential (primary) hypertension: Secondary | ICD-10-CM | POA: Diagnosis not present

## 2017-05-14 DIAGNOSIS — R600 Localized edema: Secondary | ICD-10-CM | POA: Diagnosis not present

## 2017-05-18 ENCOUNTER — Other Ambulatory Visit: Payer: Self-pay | Admitting: Nurse Practitioner

## 2017-05-18 MED ORDER — FLUTICASONE PROPIONATE 50 MCG/ACT NA SUSP: 2.0000 | Freq: Every day | NASAL | 11 refills | Status: DC

## 2017-05-18 NOTE — Telephone Encounter (Signed)
Pt. Called [facility] a [facility] on Fl[facility] 50 mcg  [facility] Pt call back  # is  [phone]

## 2017-05-24 DIAGNOSIS — R600 Localized edema: Secondary | ICD-10-CM | POA: Diagnosis not present

## 2017-05-28 DIAGNOSIS — R002 Palpitations: Secondary | ICD-10-CM | POA: Diagnosis not present

## 2017-05-28 DIAGNOSIS — R600 Localized edema: Secondary | ICD-10-CM | POA: Diagnosis not present

## 2017-05-28 DIAGNOSIS — I10 Essential (primary) hypertension: Secondary | ICD-10-CM | POA: Diagnosis not present

## 2017-05-31 ENCOUNTER — Telehealth: Payer: Self-pay | Admitting: Nurse Practitioner

## 2017-05-31 NOTE — Telephone Encounter (Signed)
This recall affects a specific lot number.  The patient should call her pharmacy to see if she is affected.

## 2017-05-31 NOTE — Telephone Encounter (Signed)
Pt received a letter saying there was a recall on the hydrochlorothiazide.  Please call [phone]

## 2017-06-08 ENCOUNTER — Other Ambulatory Visit: Payer: Self-pay

## 2017-06-08 ENCOUNTER — Telehealth: Payer: Self-pay

## 2017-06-08 DIAGNOSIS — Z1211 Encounter for screening for malignant neoplasm of colon: Secondary | ICD-10-CM

## 2017-06-08 DIAGNOSIS — Z1212 Encounter for screening for malignant neoplasm of rectum: Principal | ICD-10-CM

## 2017-06-08 NOTE — Telephone Encounter (Signed)
Gastroenterology Pre-Procedure Review  Request Date: 10/2 Requesting Physician: Dr. Servando Snare  PATIENT REVIEW QUESTIONS: The patient responded to the following health history questions as indicated:    1. Are you having any GI issues? no 2. Do you have a personal history of Polyps? no 3. Do you have a family history of Colon Cancer or Polyps? no 4. Diabetes Mellitus? no 5. Joint replacements in the past 12 months?no 6. Major health problems in the past 3 months?yes (Rotator Cuff Surgery) 7. Any artificial heart valves, MVP, or defibrillator?no    MEDICATIONS & ALLERGIES:    Patient reports the following regarding taking any anticoagulation/antiplatelet therapy:   Plavix, Coumadin, Eliquis, Xarelto, Lovenox, Pradaxa, Brilinta, or Effient? no Aspirin? yes ( )  Patient confirms/reports the following medications:  Current Outpatient Prescriptions  Medication Sig Dispense Refill  . acetaminophen (TYLENOL) 500 MG tablet Take 500 mg by mouth every 6 (six) hours as needed.    [name] albuterol (PROVENTIL HFA;VENTOLIN HFA) 108 (90 Base) MCG/ACT inhaler Inhale 2 puffs into the lungs every 6 (six) hours as needed for wheezing or shortness of breath. 1 Inhaler 1  . aspirin EC 81 MG tablet Take 81 mg by mouth daily.    . Blood Pressure Monitoring (B-D ASSURE BPM/AUTO WRIST CUFF) MISC 1 Device by Does not apply route daily. 1 each 0  . fluticasone (FLONASE) 50 MCG/ACT nasal spray Place 2 sprays into both nostrils daily. 16 g 11  . hydrochlorothiazide (HYDRODIURIL) 12.5 MG tablet Take 12.5 mg by mouth daily.    [name] lisinopril (PRINIVIL,ZESTRIL) 20 MG tablet TAKE 1 TABLET(20 MG) BY MOUTH DAILY 90 tablet 3  . meloxicam (MOBIC) 15 MG tablet Take 1 tablet (15 mg total) by mouth daily. (Patient taking differently: Take 15 mg by mouth daily. Take 1/2 tablet daily) 30 tablet 3  . metoprolol succinate (TOPROL-XL) 25 MG 24 hr tablet Take 1 tablet (25 mg total) by mouth daily. Take with or immediately following a meal.  30 tablet 2  . pantoprazole (PROTONIX) 40 MG tablet Take 1 tablet (40 mg total) by mouth daily. 30 tablet 5  . potassium chloride SA (K-DUR,KLOR-CON) 20 MEQ tablet Take 20 mEq by mouth 2 (two) times daily.    . pravastatin (PRAVACHOL) 10 MG tablet      No current facility-administered medications for this visit.    Facility-Administered Medications Ordered in Other Visits  Medication Dose Route Frequency Provider Last Rate Last Dose  . influenza  inactive virus vaccine (FLUZONE/FLUARIX) injection 0.5 mL  0.5 mL Intramuscular Once Ronnald Nian, MD        Patient confirms/reports the following allergies:  Allergies  Allergen Reactions  . Meclizine   . Other     Other reaction(s): Other (See Comments) Patient notes there is a pain medication that causes hot flushing sensation in her mouth but cannot recall its name.  . Prednisone Nausea And Vomiting  . Oxycodone Rash    No orders of the defined types were placed in this encounter.   AUTHORIZATION INFORMATION Primary Insurance: 1D#: Group #:  Secondary Insurance: 1D#: Group #:  SCHEDULE INFORMATION: Date: 10/2  Time: Location: [facility]

## 2017-06-11 DIAGNOSIS — I2510 Atherosclerotic heart disease of native coronary artery without angina pectoris: Secondary | ICD-10-CM | POA: Diagnosis not present

## 2017-06-11 DIAGNOSIS — K210 Gastro-esophageal reflux disease with esophagitis: Secondary | ICD-10-CM | POA: Diagnosis not present

## 2017-06-11 DIAGNOSIS — I10 Essential (primary) hypertension: Secondary | ICD-10-CM | POA: Diagnosis not present

## 2017-06-11 DIAGNOSIS — R600 Localized edema: Secondary | ICD-10-CM | POA: Diagnosis not present

## 2017-06-11 DIAGNOSIS — R079 Chest pain, unspecified: Secondary | ICD-10-CM | POA: Diagnosis not present

## 2017-06-19 ENCOUNTER — Encounter
Admission: RE | Disposition: A | Discharge status: Home / Self Care | Payer: Self-pay | Source: Ambulatory Visit | Attending: Gastroenterology

## 2017-06-19 ENCOUNTER — Encounter: Payer: Self-pay | Admitting: *Deleted

## 2017-06-19 ENCOUNTER — Ambulatory Visit: Payer: Medicare HMO | Admitting: Anesthesiology

## 2017-06-19 ENCOUNTER — Ambulatory Visit
Admission: RE | Admit: 2017-06-19 | Discharge: 2017-06-19 | Disposition: A | Discharge status: Home / Self Care | Payer: Medicare HMO | Source: Ambulatory Visit | Attending: Gastroenterology | Admitting: Gastroenterology

## 2017-06-19 DIAGNOSIS — K219 Gastro-esophageal reflux disease without esophagitis: Secondary | ICD-10-CM | POA: Insufficient documentation

## 2017-06-19 DIAGNOSIS — Z6841 Body Mass Index (BMI) 40.0 and over, adult: Secondary | ICD-10-CM | POA: Insufficient documentation

## 2017-06-19 DIAGNOSIS — R7303 Prediabetes: Secondary | ICD-10-CM | POA: Insufficient documentation

## 2017-06-19 DIAGNOSIS — J45909 Unspecified asthma, uncomplicated: Secondary | ICD-10-CM | POA: Insufficient documentation

## 2017-06-19 DIAGNOSIS — I739 Peripheral vascular disease, unspecified: Secondary | ICD-10-CM | POA: Insufficient documentation

## 2017-06-19 DIAGNOSIS — Z1211 Encounter for screening for malignant neoplasm of colon: Secondary | ICD-10-CM | POA: Diagnosis not present

## 2017-06-19 DIAGNOSIS — F419 Anxiety disorder, unspecified: Secondary | ICD-10-CM | POA: Diagnosis not present

## 2017-06-19 DIAGNOSIS — K5730 Diverticulosis of large intestine without perforation or abscess without bleeding: Secondary | ICD-10-CM | POA: Diagnosis not present

## 2017-06-19 DIAGNOSIS — Z1212 Encounter for screening for malignant neoplasm of rectum: Secondary | ICD-10-CM | POA: Diagnosis not present

## 2017-06-19 DIAGNOSIS — K5790 Diverticulosis of intestine, part unspecified, without perforation or abscess without bleeding: Secondary | ICD-10-CM | POA: Diagnosis not present

## 2017-06-19 DIAGNOSIS — M1990 Unspecified osteoarthritis, unspecified site: Secondary | ICD-10-CM | POA: Diagnosis not present

## 2017-06-19 DIAGNOSIS — I10 Essential (primary) hypertension: Secondary | ICD-10-CM | POA: Insufficient documentation

## 2017-06-19 DIAGNOSIS — Z79899 Other long term (current) drug therapy: Secondary | ICD-10-CM | POA: Diagnosis not present

## 2017-06-19 HISTORY — PX: COLONOSCOPY WITH PROPOFOL: SHX5780

## 2017-06-19 SURGERY — COLONOSCOPY WITH PROPOFOL
Anesthesia: General

## 2017-06-19 MED ORDER — PROPOFOL 500 MG/50ML IV EMUL
INTRAVENOUS | Status: AC
  Filled 2017-06-19: qty 50

## 2017-06-19 MED ORDER — SODIUM CHLORIDE 0.9 % IV SOLN
INTRAVENOUS | Status: DC
  Administered 2017-06-19 (×2): via INTRAVENOUS

## 2017-06-19 MED ORDER — PROPOFOL 10 MG/ML IV BOLUS
INTRAVENOUS | Status: DC | PRN
  Administered 2017-06-19 (×2): 50 mg via INTRAVENOUS

## 2017-06-19 NOTE — H&P (Signed)
[name], MD [facility] [address] [place], [place] [address] Phone: [phone] Fax : [phone]  Primary Care Physician:  Galen Manila, NP Primary Gastroenterologist:  Dr. Servando Snare  Pre-Procedure History & Physical: HPI:  Megan Perez is a 71 y.o. female is here for a screening colonoscopy.   Past Medical History:  Diagnosis Date  . Anxiety   . Asthma    reactive airway and shortness of breath w/ exertion in hot temperatures  . Bronchitis   . GERD (gastroesophageal reflux disease)   . Hypertension   . Morbid obesity with BMI of 40.0-44.9, adult (HCC) 04/08/2012  . OA (osteoarthritis)    knee  . Osteoporosis   . Prediabetes   . Venous (peripheral) insufficiency 08/19/2010   Qualifier: Diagnosis of  By: [name], [place]      Past Surgical History:  Procedure Laterality Date  . ABDOMINAL HYSTERECTOMY    . CARPAL TUNNEL RELEASE Right   . CHOLECYSTECTOMY    . HEEL SPUR SURGERY    . REPLACEMENT TOTAL KNEE     left  . REPLACEMENT TOTAL KNEE Right   . SHOULDER ARTHROSCOPY WITH OPEN ROTATOR CUFF REPAIR Right 09/15/2015   Procedure: SHOULDER ARTHROSCOPY WITH OPEN ROTATOR CUFF REPAIR;  Surgeon: [name], MD;  Location: [facility] ORS;  Service: Orthopedics;  Laterality: Right;  . SHOULDER SURGERY Left     Prior to Admission medications   Medication Sig Start Date End Date Taking? Authorizing Provider  acetaminophen (TYLENOL) 500 MG tablet Take 500 mg by mouth every 6 (six) hours as needed.   Yes [provider]  aspirin EC 81 MG tablet Take 81 mg by mouth daily.   Yes [provider]  fluticasone (FLONASE) 50 MCG/ACT nasal spray Place 2 sprays into both nostrils daily. 05/18/17  Yes Galen Manila, NP  hydrochlorothiazide (HYDRODIURIL) 12.5 MG tablet Take 12.5 mg by mouth daily.   Yes [provider]  lisinopril (PRINIVIL,ZESTRIL) 20 MG tablet TAKE 1 TABLET(20 MG) BY MOUTH DAILY 01/16/17  Yes Ronnald Nian, MD  meloxicam  (MOBIC) 15 MG tablet Take 1 tablet (15 mg total) by mouth daily. Patient taking differently: Take 15 mg by mouth daily. Take 1/2 tablet daily 09/15/15  Yes [name], MD  pantoprazole (PROTONIX) 40 MG tablet Take 1 tablet (40 mg total) by mouth daily. 05/08/17  Yes Galen Manila, NP  potassium chloride SA (K-DUR,KLOR-CON) 20 MEQ tablet Take 20 mEq by mouth 2 (two) times daily.   Yes [provider]  pravastatin (PRAVACHOL) 10 MG tablet  04/25/17  Yes [provider]  albuterol (PROVENTIL HFA;VENTOLIN HFA) 108 (90 Base) MCG/ACT inhaler Inhale 2 puffs into the lungs every 6 (six) hours as needed for wheezing or shortness of breath. 03/08/17   Galen Manila, NP  Blood Pressure Monitoring (B-D ASSURE BPM/AUTO WRIST CUFF) MISC 1 Device by Does not apply route daily. 05/08/17   Galen Manila, NP  metoprolol succinate (TOPROL-XL) 25 MG 24 hr tablet Take 1 tablet (25 mg total) by mouth daily. Take with or immediately following a meal. Patient not taking: Reported on 06/19/2017 05/01/17   Galen Manila, NP    Allergies as of 06/08/2017 - Review Complete 05/11/2017  Allergen Reaction Noted  . Meclizine    . Other  04/21/2014  . Prednisone Nausea And Vomiting 08/31/2011  . Oxycodone Rash 09/03/2015    Family History  Problem Relation Age of Onset  . Adopted: Yes  . Heart  disease Father   . Subarachnoid hemorrhage Mother   . Hypertension Mother   . Subarachnoid hemorrhage Sister   . Hypertension Sister   . Hypertension Sister   . Hypertension Sister   . Healthy Sister   . Breast cancer Neg Hx     Social History   Social History  . Marital status: Widowed    Spouse name: N/A  . Number of children: N/A  . Years of education: N/A   Occupational History  . Not on file.   Social History Main Topics  . Smoking status: Never Smoker  . Smokeless tobacco: Never Used  . Alcohol use No  . Drug use: No  . Sexual activity: Yes    Birth control/  protection: Surgical   Other Topics Concern  . Not on file   Social History Narrative   Retired.  Lives alone    Review of Systems: See HPI, otherwise negative ROS  Physical Exam: BP (!) 160/91   Pulse 82   Temp (!) 96.7 F (35.9 C) (Tympanic)   Resp 20   Ht  (1.702 m)   Wt 272 lb (123.4 kg)   SpO2 99%   BMI 42.60 kg/m  General:   Alert,  pleasant and cooperative in NAD Head:  Normocephalic and atraumatic. Neck:  Supple; no masses or thyromegaly. Lungs:  Clear throughout to auscultation.    Heart:  Regular rate and rhythm. Abdomen:  Soft, nontender and nondistended. Normal bowel sounds, without guarding, and without rebound.   Neurologic:  Alert and  oriented x4;  grossly normal neurologically.  Impression/Plan: Megan Perez is now here to undergo a screening colonoscopy.  Risks, benefits, and alternatives regarding colonoscopy have been reviewed with the patient.  Questions have been answered.  All parties agreeable.

## 2017-06-19 NOTE — Anesthesia Post-op Follow-up Note (Signed)
Anesthesia QCDR form completed.        

## 2017-06-19 NOTE — Anesthesia Preprocedure Evaluation (Signed)
Anesthesia Evaluation  Patient identified by MRN, date of birth, ID band Patient awake    Reviewed: Allergy & Precautions, NPO status , Patient's Chart, lab work & pertinent test results  Airway Mallampati: II       Dental  (+) Teeth Intact   Pulmonary asthma ,     + decreased breath sounds      Cardiovascular hypertension, Pt. on home beta blockers + Peripheral Vascular Disease   Rhythm:Regular Rate:Normal     Neuro/Psych Anxiety negative neurological ROS     GI/Hepatic Neg liver ROS, GERD  Medicated,  Endo/Other  negative endocrine ROSMorbid obesity  Renal/GU negative Renal ROS  negative genitourinary   Musculoskeletal   Abdominal (+) + obese,   Peds negative pediatric ROS (+)  Hematology   Anesthesia Other Findings   Reproductive/Obstetrics                             Anesthesia Physical Anesthesia Plan  ASA: II  Anesthesia Plan: General   Post-op Pain Management:    Induction: Intravenous  PONV Risk Score and Plan: 0  Airway Management Planned: Natural Airway and Nasal Cannula  Additional Equipment:   Intra-op Plan:   Post-operative Plan:   Informed Consent: I have reviewed the patients History and Physical, chart, labs and discussed the procedure including the risks, benefits and alternatives for the proposed anesthesia with the patient or authorized representative who has indicated his/her understanding and acceptance.     Plan Discussed with: CRNA  Anesthesia Plan Comments:         Anesthesia Quick Evaluation

## 2017-06-19 NOTE — Transfer of Care (Signed)
Immediate Anesthesia Transfer of Care Note  Patient: Megan Perez  Procedure(s) Performed: COLONOSCOPY WITH PROPOFOL (N/A )  Patient Location: PACU  Anesthesia Type:MAC  Level of Consciousness: awake, alert  and oriented  Airway & Oxygen Therapy: Patient Spontanous Breathing and Patient connected to nasal cannula oxygen  Post-op Assessment: Report given to RN and Post -op Vital signs reviewed and stable  Post vital signs: Reviewed and stable  Last Vitals:  Vitals:   06/19/17 0939  BP: (!) 160/91  Pulse: 82  Resp: 20  Temp: (!) 35.9 C  SpO2: 99%    Last Pain:  Vitals:   06/19/17 0939  TempSrc: Tympanic         Complications: No apparent anesthesia complications

## 2017-06-19 NOTE — Op Note (Signed)
[facility] Gastroenterology Patient Name: Megan Perez Procedure Date: 06/19/2017 10:58 AM MRN: [id number] Account #: [id number] Date of Birth: 1946/02/28 Admit Type: Outpatient Age: 71 Room: [facility] ROOM 4 Gender: Female Note Status: Finalized Procedure:            Colonoscopy Indications:          Screening for colorectal malignant neoplasm Providers:            [name] MD, MD Referring MD:         No Local Md, MD (Referring MD) Medicines:            Propofol per Anesthesia Complications:        No immediate complications. Procedure:            Pre-Anesthesia Assessment:                       - Prior to the procedure, a History and Physical was                        performed, and patient medications and allergies were                        reviewed. The patient's tolerance of previous                        anesthesia was also reviewed. The risks and benefits of                        the procedure and the sedation options and risks were                        discussed with the patient. All questions were                        answered, and informed consent was obtained. Prior                        Anticoagulants: The patient has taken no previous                        anticoagulant or antiplatelet agents. ASA Grade                        Assessment: II - A patient with mild systemic disease.                        After reviewing the risks and benefits, the patient was                        deemed in satisfactory condition to undergo the                        procedure.                       After obtaining informed consent, the colonoscope was                        passed under direct vision. Throughout the procedure,  the patient's blood pressure, pulse, and oxygen                        saturations were monitored continuously. The Olympus                        CF-H180AL colonoscope ( S#: [id number] ) was introduced                   through the anus and advanced to the the cecum,                        identified by appendiceal orifice and ileocecal valve.                        The colonoscopy was performed without difficulty. The                        patient tolerated the procedure well. The quality of                        the bowel preparation was excellent. Findings:      The perianal and digital rectal examinations were normal.      Multiple small-mouthed diverticula were found in the sigmoid colon,       descending colon and entire colon.      The exam was otherwise without abnormality. Impression:           - Diverticulosis in the sigmoid colon, in the                        descending colon and in the entire examined colon.                       - The examination was otherwise normal.                       - No specimens collected. Recommendation:       - Discharge patient to home.                       - Resume previous diet.                       - Continue present medications. Procedure Code(s):    --- Professional ---                       [phone], Colonoscopy, flexible; diagnostic, including                        collection of specimen(s) by brushing or washing, when                        performed (separate procedure) Diagnosis Code(s):    --- Professional ---                       Z12.11, Encounter for screening for malignant neoplasm                        of colon CPT copyright 2016 American Medical Association. All rights reserved. The codes documented in this report  are preliminary and upon coder review may  be revised to meet current compliance requirements. [name] MD, MD 06/19/2017 11:14:42 AM This report has been signed electronically. Number of Addenda: 0 Note Initiated On: 06/19/2017 10:58 AM Scope Withdrawal Time: 0 hours 6 minutes 4 seconds  Total Procedure Duration: 0 hours 9 minutes 12 seconds       [facility]

## 2017-06-19 NOTE — Anesthesia Postprocedure Evaluation (Signed)
Anesthesia Post Note  Patient: Megan Perez  Procedure(s) Performed: COLONOSCOPY WITH PROPOFOL (N/A )  Patient location during evaluation: PACU Anesthesia Type: General Level of consciousness: awake Pain management: pain level controlled Vital Signs Assessment: post-procedure vital signs reviewed and stable Respiratory status: spontaneous breathing Cardiovascular status: stable Anesthetic complications: no     Last Vitals:  Vitals:   06/19/17 1136 06/19/17 1146  BP: 136/72 (!) 146/81  Pulse: (!) 59   Resp: 16   Temp:    SpO2: 100%     Last Pain:  Vitals:   06/19/17 1116  TempSrc: Tympanic                 VAN STAVEREN,Kentrell Hallahan

## 2017-06-20 ENCOUNTER — Encounter: Payer: Self-pay | Admitting: Gastroenterology

## 2017-06-21 DIAGNOSIS — H04203 Unspecified epiphora, bilateral lacrimal glands: Secondary | ICD-10-CM | POA: Diagnosis not present

## 2017-06-21 DIAGNOSIS — H2513 Age-related nuclear cataract, bilateral: Secondary | ICD-10-CM | POA: Diagnosis not present

## 2017-06-21 DIAGNOSIS — H35039 Hypertensive retinopathy, unspecified eye: Secondary | ICD-10-CM | POA: Diagnosis not present

## 2017-07-11 ENCOUNTER — Telehealth: Payer: Self-pay | Admitting: Nurse Practitioner

## 2017-07-11 DIAGNOSIS — K219 Gastro-esophageal reflux disease without esophagitis: Secondary | ICD-10-CM

## 2017-07-11 DIAGNOSIS — R1084 Generalized abdominal pain: Secondary | ICD-10-CM

## 2017-07-11 NOTE — Telephone Encounter (Signed)
As per patient had tried Protonix not improving her Sx gets acid reflux and stomach pain 2 hour after ant food intake. Protonix was switched from Omeprazole which was not helping either please suggest ?

## 2017-07-11 NOTE — Telephone Encounter (Signed)
Pt. Called  States that she was taking pantoprazole 40 mg acid reflux and she was having pain. Pt. Call back # [phone]

## 2017-07-12 ENCOUNTER — Other Ambulatory Visit: Payer: Self-pay | Admitting: Family Medicine

## 2017-07-12 DIAGNOSIS — K219 Gastro-esophageal reflux disease without esophagitis: Secondary | ICD-10-CM

## 2017-07-12 DIAGNOSIS — R1084 Generalized abdominal pain: Secondary | ICD-10-CM

## 2017-07-12 MED ORDER — SUCRALFATE 1 G PO TABS: 1.0000 g | ORAL_TABLET | Freq: Three times a day (TID) | ORAL | 0 refills | Status: DC

## 2017-07-12 NOTE — Addendum Note (Signed)
Addended by: Smitty CordsKARAMALEGOS, ALEXANDER J on: 07/12/2017 07:01 AM   Modules accepted: Orders

## 2017-07-12 NOTE — Telephone Encounter (Signed)
Pt advised.

## 2017-07-12 NOTE — Telephone Encounter (Signed)
Covering inbox for Megan McardleLauren Perez, AGPCNP-BC while she is out of office.  I reviewed her chart, and I do not see that this topic was discussed in detail with patient at recent office visits with PCP. She had been on Omeprazole and then 8/21 switched to Protonix 40mg  daily. Also she had a colonoscopy per GI for screening.  I have a few recommendations but ultimately she would probably need to follow-up with us in the next 1-2 weeks if not improving, and may need to be seen by GI in the office and consider an Upper Endoscopy this time.  For now she can try the following: 1. I will rx Sucralfate oral med to be taken with food/meals 3 times a day and bedtime - AS NEEDED for abdominal discomfort, it coats the lining and helps any ulcer or gastritis heal.  2. Make sure taking Protonix 40mg  daily FIRST THING in morning about 15-30 min before first meal of the day, or else it will not be as effective.  3. Recommend that she reduce or stop the Meloxicam anti inflammatory only for short period of time maybe 1 week or so, to see if it reduces her pain, these medicines can irritate or inflame the stomach.  4. Avoid spicy greasy foods, avoid large meals, try smaller portions, try not to eat too late and lay down soon after eating, these will make symptoms worse.  If dramatic worsening pain, blood in stool, dark stools, nausea vomiting, or other new concern, notify office and come in sooner to be seen or go to hospital ED.  Will forward to PCP for review.  Megan PilarAlexander Sakara Lehtinen, DO [facility] 07/12/2017, 7:00 AM

## 2017-07-18 ENCOUNTER — Telehealth: Payer: Self-pay

## 2017-07-18 NOTE — Telephone Encounter (Signed)
Pt called stating she was returning someone call from our office. I ask the pt how was she feeling since the medication change. She informed me that her symptoms have improved since taking the sucralfate and Protonix.

## 2017-07-19 ENCOUNTER — Other Ambulatory Visit: Payer: Self-pay

## 2017-07-19 ENCOUNTER — Telehealth: Payer: Self-pay | Admitting: Nurse Practitioner

## 2017-07-19 DIAGNOSIS — Z23 Encounter for immunization: Secondary | ICD-10-CM

## 2017-07-19 DIAGNOSIS — H04123 Dry eye syndrome of bilateral lacrimal glands: Secondary | ICD-10-CM | POA: Diagnosis not present

## 2017-07-19 NOTE — Telephone Encounter (Signed)
Pt. Called requesting that you call her at [phone]

## 2017-07-20 MED ORDER — ZOSTER VAC RECOMB ADJUVANTED 50 MCG/0.5ML IM SUSR: 0.5000 mL | Freq: Once | INTRAMUSCULAR | 1 refills | Status: AC

## 2017-07-20 NOTE — Telephone Encounter (Signed)
Pt would like shingles shot.  [facility].  She needs an affordable option. Requested pt to call pharmacy to ask about cost before she arrives for administration.  Will send order for Shingrix, but pt may receive Zostavax if she would like.  Pt to call back for zostavax order if needed.

## 2017-08-02 ENCOUNTER — Other Ambulatory Visit: Payer: Self-pay | Admitting: Nurse Practitioner

## 2017-08-02 DIAGNOSIS — K219 Gastro-esophageal reflux disease without esophagitis: Secondary | ICD-10-CM

## 2017-08-02 DIAGNOSIS — R1084 Generalized abdominal pain: Secondary | ICD-10-CM

## 2017-08-13 ENCOUNTER — Other Ambulatory Visit: Payer: Self-pay

## 2017-11-28 ENCOUNTER — Other Ambulatory Visit: Payer: Self-pay | Admitting: Nurse Practitioner

## 2017-11-28 DIAGNOSIS — K219 Gastro-esophageal reflux disease without esophagitis: Secondary | ICD-10-CM

## 2017-11-28 DIAGNOSIS — R1084 Generalized abdominal pain: Secondary | ICD-10-CM

## 2017-12-13 ENCOUNTER — Emergency Department: Payer: Medicare HMO

## 2017-12-13 ENCOUNTER — Emergency Department
Admission: EM | Admit: 2017-12-13 | Discharge: 2017-12-13 | Disposition: A | Discharge status: Home / Self Care | Payer: Medicare HMO | Attending: Emergency Medicine | Admitting: Emergency Medicine

## 2017-12-13 ENCOUNTER — Telehealth: Payer: Self-pay

## 2017-12-13 ENCOUNTER — Other Ambulatory Visit: Payer: Self-pay

## 2017-12-13 DIAGNOSIS — M545 Low back pain: Secondary | ICD-10-CM | POA: Diagnosis not present

## 2017-12-13 DIAGNOSIS — I10 Essential (primary) hypertension: Secondary | ICD-10-CM | POA: Diagnosis not present

## 2017-12-13 DIAGNOSIS — Z79899 Other long term (current) drug therapy: Secondary | ICD-10-CM | POA: Insufficient documentation

## 2017-12-13 DIAGNOSIS — N289 Disorder of kidney and ureter, unspecified: Secondary | ICD-10-CM | POA: Diagnosis not present

## 2017-12-13 DIAGNOSIS — J4520 Mild intermittent asthma, uncomplicated: Secondary | ICD-10-CM | POA: Diagnosis not present

## 2017-12-13 DIAGNOSIS — R202 Paresthesia of skin: Secondary | ICD-10-CM | POA: Diagnosis not present

## 2017-12-13 DIAGNOSIS — R600 Localized edema: Secondary | ICD-10-CM | POA: Diagnosis not present

## 2017-12-13 DIAGNOSIS — G8929 Other chronic pain: Secondary | ICD-10-CM | POA: Insufficient documentation

## 2017-12-13 DIAGNOSIS — I951 Orthostatic hypotension: Secondary | ICD-10-CM | POA: Insufficient documentation

## 2017-12-13 DIAGNOSIS — R42 Dizziness and giddiness: Secondary | ICD-10-CM | POA: Diagnosis not present

## 2017-12-13 DIAGNOSIS — R55 Syncope and collapse: Secondary | ICD-10-CM

## 2017-12-13 LAB — URINALYSIS, COMPLETE (UACMP) WITH MICROSCOPIC
Bacteria, UA: NONE SEEN
Bilirubin Urine: NEGATIVE
Glucose, UA: NEGATIVE mg/dL
Hgb urine dipstick: NEGATIVE
Ketones, ur: NEGATIVE mg/dL
Leukocytes, UA: NEGATIVE
Nitrite: NEGATIVE
Protein, ur: NEGATIVE mg/dL
Specific Gravity, Urine: 1.012 (ref 1.005–1.030)
pH: 5 (ref 5.0–8.0)

## 2017-12-13 LAB — BASIC METABOLIC PANEL
Anion gap: 11 (ref 5–15)
BUN: 26 mg/dL — ABNORMAL HIGH (ref 6–20)
CO2: 27 mmol/L (ref 22–32)
Calcium: 9.4 mg/dL (ref 8.9–10.3)
Chloride: 101 mmol/L (ref 101–111)
Creatinine, Ser: 1.2 mg/dL — ABNORMAL HIGH (ref 0.44–1.00)
GFR calc Af Amer: 51 mL/min — ABNORMAL LOW (ref >60)
GFR calc non Af Amer: 44 mL/min — ABNORMAL LOW (ref >60)
Glucose, Bld: 108 mg/dL — ABNORMAL HIGH (ref 65–99)
Potassium: 3.6 mmol/L (ref 3.5–5.1)
Sodium: 139 mmol/L (ref 135–145)

## 2017-12-13 LAB — CBC
HCT: 36.8 % (ref 35.0–47.0)
Hemoglobin: 12 g/dL (ref 12.0–16.0)
MCH: 30.2 pg (ref 26.0–34.0)
MCHC: 32.6 g/dL (ref 32.0–36.0)
MCV: 92.5 fL (ref 80.0–100.0)
Platelets: 340 10*3/uL (ref 150–440)
RBC: 3.98 MIL/uL (ref 3.80–5.20)
RDW: 15 % — ABNORMAL HIGH (ref 11.5–14.5)
WBC: 6.3 10*3/uL (ref 3.6–11.0)

## 2017-12-13 LAB — TROPONIN I: Troponin I: <0.03 ng/mL (ref <0.03)

## 2017-12-13 MED ORDER — SODIUM CHLORIDE 0.9 % IV BOLUS
1000.0000 mL | Freq: Once | INTRAVENOUS | Status: AC
  Administered 2017-12-13: 1000 mL via INTRAVENOUS

## 2017-12-13 NOTE — Telephone Encounter (Addendum)
The pt called complaining of dizziness that worsen w/ movement. She also complaining of bilateral numbness in her feet x 2 mths. She admits getting up suddenly off the couch after waking up out her sleep by the volume of her tv. She jumped up to turn the TV down and fell and hit her chin on the console of her TV. Pt states no visible injures from the fall, but still noticing the dizziness and numbness to her feet. She's concern her dizziness could be from her bp medication, her recent bp is 124/73.. I informed the pt that we currently do not have any appt available today. I offered her two (2) appts for tomorrow and the patient declined them, because of previous plans. I informed the pt that the only recommendation is to go to Urgent Care and we can f/u with her on next week. Pt agreed.

## 2017-12-13 NOTE — ED Provider Notes (Signed)
[facility] Emergency Department Provider Note  ____________________________________________  Time seen: Approximately 6:08 PM  I have reviewed the triage vital signs and the nursing notes.   HISTORY  Chief Complaint Dizziness and Back Pain    HPI Megan Perez is a 72 y.o. female with a history of hypertension on HCTZ, and chronic lower extremity edema status post bilateral knee replacements on Lasix, osteoarthritis, presenting with syncope, lightheadedness, and low back pain.  The patient reports that yesterday "I was asleep and I jumped out of my chair to turn down the volume on the TV," and she became profoundly lightheaded and had a syncopal episode, landing on the floor.  She denies any injury from her pain.  She reports her syncope was brief and she did not have any urinary or fecal incontinence.  Since that episode, the patient has continued to have profound room spinning and lightheaded sensation with standing.  She has not had any associated chest pain, palpitations, diaphoresis, nausea or vomiting.  No new lower extremity swelling or calf pain.  No recent illness including fevers, chills, nausea vomiting or diarrhea.  She has not recently had any changes in her medications.  She does report that she has been eating normally but does not drink a lot of fluid in addition, the patient reports that for the past 2 years, she has a low back pain which she describes as "sharp" pain at the top of the buttock in the midline that she notices mostly when she stands up from a sitting position and has to wait until it passes and resolve spontaneously.  She has not had any trauma and has never been evaluated for this back pain.  Past Medical History:  Diagnosis Date  . Anxiety   . Asthma    reactive airway and shortness of breath w/ exertion in hot temperatures  . Bronchitis   . GERD (gastroesophageal reflux disease)   . Hypertension   . Morbid obesity with BMI of  40.0-44.9, adult (HCC) 04/08/2012  . OA (osteoarthritis)    knee  . Osteoporosis   . Prediabetes   . Venous (peripheral) insufficiency 08/19/2010   Qualifier: Diagnosis of  By: [name], [place]      Patient Active Problem List   Diagnosis Date Noted  . Screening for colorectal cancer   . Inguinal hernia, left 05/11/2017  . Status post total left knee replacement 03/08/2017  . Mild intermittent asthma without complication 01/16/2017  . Degenerative spondylolisthesis 08/02/2016  . Prediabetes 02/16/2016  . Full thickness rotator cuff tear 08/31/2015  . Impingement syndrome of shoulder region 08/02/2015  . Status post total right knee replacement 09/01/2014  . Morbid obesity (HCC) 05/21/2014  . Osteoarthrosis, localized, primary, knee 04/21/2014  . Rotator cuff rupture, complete 04/21/2014  . Allergic rhinitis 01/21/2014  . Morbid obesity with BMI of 40.0-44.9, adult (HCC) 04/08/2012  . Arthritis 11/24/2011  . Essential hypertension 08/19/2010  . Venous (peripheral) insufficiency 08/19/2010  . GERD 08/19/2010    Past Surgical History:  Procedure Laterality Date  . ABDOMINAL HYSTERECTOMY    . CARPAL TUNNEL RELEASE Right   . CHOLECYSTECTOMY    . COLONOSCOPY WITH PROPOFOL N/A 06/19/2017   Procedure: COLONOSCOPY WITH PROPOFOL;  Surgeon: [name], MD;  Location: [facility] ENDOSCOPY;  Service: Endoscopy;  Laterality: N/A;  . HEEL SPUR SURGERY    . REPLACEMENT TOTAL KNEE     left  . REPLACEMENT TOTAL KNEE Right   . SHOULDER ARTHROSCOPY WITH OPEN ROTATOR CUFF  REPAIR Right 09/15/2015   Procedure: SHOULDER ARTHROSCOPY WITH OPEN ROTATOR CUFF REPAIR;  Surgeon: [name], MD;  Location: [facility] ORS;  Service: Orthopedics;  Laterality: Right;  . SHOULDER SURGERY Left     Current Outpatient Rx  . Order #: [id number] Class: Historical Med  . Order #: [id number] Class: Normal  . Order #: [id number] Class: Historical Med  . Order #: [id number] Class: Normal  . Order #: [id number] Class: Normal   . Order #: [id number] Class: Historical Med  . Order #: [id number] Class: Normal  . Order #: [id number] Class: Normal  . Order #: [id number] Class: Normal  . Order #: [id number] Class: Normal  . Order #: [id number] Class: Historical Med  . Order #: [id number] Class: Historical Med  . Order #: [id number] Class: Normal    Allergies Meclizine; Other; Penicillins; Prednisone; and Oxycodone  Family History  Adopted: Yes  Problem Relation Age of Onset  . Heart disease Father   . Subarachnoid hemorrhage Mother   . Hypertension Mother   . Subarachnoid hemorrhage Sister   . Hypertension Sister   . Hypertension Sister   . Hypertension Sister   . Healthy Sister   . Breast cancer Neg Hx     Social History Social History   Tobacco Use  . Smoking status: Never Smoker  . Smokeless tobacco: Never Used  Substance Use Topics  . Alcohol use: No  . Drug use: No    Review of Systems Constitutional: No fever/chills.  Positive lightheadedness with syncope.  Positive dizziness with room spinning.  No diaphoresis.  No malaise. Eyes: No visual changes.  No blurred or double vision. ENT: No sore throat. No congestion or rhinorrhea. Cardiovascular: Denies chest pain. Denies palpitations. Respiratory: Denies shortness of breath.  No cough. Gastrointestinal: No abdominal pain.  No nausea, no vomiting.  No diarrhea.  No constipation. Genitourinary: Negative for dysuria. Musculoskeletal: Positive for chronic unchanged back pain. Skin: Negative for rash. Neurological: Negative for headaches. No focal numbness, tingling or weakness.  No difficulty walking.  No changes in vision, speech or mental status.    ____________________________________________   PHYSICAL EXAM:  VITAL SIGNS: ED Triage Vitals [12/13/17 1442]  Enc Vitals Group     BP      Pulse      Resp      Temp      Temp src      SpO2      Weight 280 lb (127 kg)     Height 5\' 7"  (1.702 m)     Head Circumference      Peak Flow      Pain  Score 8     Pain Loc      Pain Edu?      Excl. in GC?     Constitutional: Alert and oriented. Well appearing and in no acute distress. Answers questions appropriately. Eyes: Conjunctivae are normal.  EOMI. PERRLA.  No vertical or horizontal nystagmus.  No scleral icterus. Head: Atraumatic. Nose: No congestion/rhinnorhea. Mouth/Throat: Mucous membranes are mildly dry.  Neck: No stridor.  Supple.  No JVD.  No meningismus. Cardiovascular: Normal rate, regular rhythm. No murmurs, rubs or gallops.  Respiratory: Normal respiratory effort.  No accessory muscle use or retractions. Lungs CTAB.  No wheezes, rales or ronchi. Gastrointestinal: Morbidly obese.  Soft, nontender and nondistended.  No guarding or rebound.  No peritoneal signs. Musculoskeletal: The patient has some minimal midline tenderness to palpation at the sacroiliac joint and L4 and L5 in the midline without any step-offs or deformities.  No LE edema. No ttp in  the calves or palpable cords.  Negative Homan's sign. Neurologic:  A&Ox3.  Speech is clear.  Face and smile are symmetric.  EOMI.  Moves all extremities well. Skin:  Skin is warm, dry and intact. No rash noted. Psychiatric: Mood and affect are normal. Speech and behavior are normal.  Normal judgement.  ____________________________________________   LABS (all labs ordered are listed, but only abnormal results are displayed)  Labs Reviewed  BASIC METABOLIC PANEL - Abnormal; Notable for the following components:      Result Value   Glucose, Bld 108 (*)    BUN 26 (*)    Creatinine, Ser 1.20 (*)    GFR calc non Af Amer 44 (*)    GFR calc Af Amer 51 (*)    All other components within normal limits  CBC - Abnormal; Notable for the following components:   RDW 15.0 (*)    All other components within normal limits  URINALYSIS, COMPLETE (UACMP) WITH MICROSCOPIC  TROPONIN I  CBG MONITORING, ED   ____________________________________________  EKG  ED ECG REPORT I, Rockne MenghiniNorman,  Anne-Caroline, the attending physician, personally viewed and interpreted this ECG.   Date: 12/13/2017  EKG Time: 1451  Rate: 81  Rhythm: normal sinus rhythm  Axis: normal  Intervals:none  ST&T Change: No STEMI  ____________________________________________  RADIOLOGY  No results found.  ____________________________________________   PROCEDURES  Procedure(s) performed: None  Procedures  Critical Care performed: No ____________________________________________   INITIAL IMPRESSION / ASSESSMENT AND PLAN / ED COURSE  Pertinent labs & imaging results that were available during my care of the patient were reviewed by me and considered in my medical decision making (see chart for details).  72 y.o. on 2 diuretics, HCTZ and Lasix, who does not take good oral fluid intake, presenting with a syncopal episode yesterday with standing up quickly, and ongoing dizziness and lightheadedness with postural changes.  Overall, the patient has no other red flag warnings.  On my examination, the patient is hemodynamically stable and I do not see any evidence of acute CVA.  It is unlikely that she has ACS or MI; and intermittent arrhythmia is not excluded but I do not see any arrhythmia on her EKG today.  The patient's clinical history and her laboratory studies are suggestive of some intravascular hypovolemia and I will treat her with 2 L of intravenous fluids and reevaluate her for final disposition.  Regarding her back pain, she likely has arthritis or strain from her morbid obesity.  I have counseled her on both and encouraged her to follow-up with her primary care physician for possible physical therapy evaluation.  ____________________________________________  FINAL CLINICAL IMPRESSION(S) / ED DIAGNOSES  Final diagnoses:  Chronic midline low back pain without sciatica  Renal insufficiency  Orthostasis  Syncope, unspecified syncope type         NEW MEDICATIONS STARTED DURING THIS  VISIT:  New Prescriptions   No medications on file      Rockne MenghiniNorman, Anne-Caroline, MD 12/13/17 1914

## 2017-12-13 NOTE — ED Triage Notes (Signed)
FIRST NURSE NOTE-here for numbness to both feet for 3 months and dizziness since yesterday. Ambulatory without difficulty.  Sent from urgent care.

## 2017-12-13 NOTE — ED Triage Notes (Signed)
Pt states that she was at urgent care and they sent her to the ED for eval due to dizziness and lower back pain - pt c/o dizziness since yesterday - yesterday she fell backwards and is not sure if she lost consciousness or not - denies hitting head but states she hit her buttocks and her lower back

## 2017-12-13 NOTE — Telephone Encounter (Signed)
Agree with documentation and advice. Was conferring with Kelita when decision was made to go to Urgent Care.

## 2017-12-13 NOTE — ED Notes (Signed)
Pt discharged to home.  Family member driving.  Discharge instructions reviewed.  Verbalized understanding.  No questions or concerns at this time.  Teach back verified.  Pt in NAD.  No items left in ED.   

## 2017-12-13 NOTE — Telephone Encounter (Signed)
I called back to f/u w/ the pt to make sure she went to be evaluated and she informed me that the Urgent Care sent her to the Emergency Room to rule out she didn't have a stroke. The pt was currently at the ER.

## 2017-12-13 NOTE — ED Notes (Signed)
Pt difficult stick.  Attempted x2, with no success.

## 2017-12-13 NOTE — ED Triage Notes (Signed)
Asking about wait time.  Explained ED process and apologized for wait. No rooms available at this time.  Pt remains alert and NAD

## 2017-12-13 NOTE — Discharge Instructions (Addendum)
For your lightheadedness, please drink plenty of fluids to stay well-hydrated.  Please talk to your primary care doctor about your to water medications, hydrochlorothiazide and Lasix, to see if the doses of these medicines need to be adjusted.  For your low back pain, talk to your primary care doctor about physical therapy to strengthen the muscles in your back.  Return to the emergency department if you develop severe pain, lightheadedness or fainting, chest pain, shortness of breath, palpitations, or any other symptoms concerning to you.

## 2017-12-24 ENCOUNTER — Encounter: Payer: Self-pay | Admitting: Nurse Practitioner

## 2017-12-24 ENCOUNTER — Other Ambulatory Visit: Payer: Self-pay

## 2017-12-24 ENCOUNTER — Other Ambulatory Visit: Payer: Self-pay | Admitting: Nurse Practitioner

## 2017-12-24 ENCOUNTER — Ambulatory Visit: Payer: Medicare HMO | Admitting: Nurse Practitioner

## 2017-12-24 VITALS — BP 135/65 | HR 75 | Temp 98.1°F | Ht 67.0 in | Wt 278.2 lb

## 2017-12-24 DIAGNOSIS — R42 Dizziness and giddiness: Secondary | ICD-10-CM

## 2017-12-24 DIAGNOSIS — R7989 Other specified abnormal findings of blood chemistry: Secondary | ICD-10-CM

## 2017-12-24 DIAGNOSIS — G8929 Other chronic pain: Secondary | ICD-10-CM

## 2017-12-24 DIAGNOSIS — I10 Essential (primary) hypertension: Secondary | ICD-10-CM | POA: Diagnosis not present

## 2017-12-24 DIAGNOSIS — M5442 Lumbago with sciatica, left side: Secondary | ICD-10-CM

## 2017-12-24 LAB — COMPLETE METABOLIC PANEL WITH GFR
AG Ratio: 1.5 (calc) (ref 1.0–2.5)
ALT: 7 U/L (ref 6–29)
AST: 11 U/L (ref 10–35)
Albumin: 4.3 g/dL (ref 3.6–5.1)
Alkaline phosphatase (APISO): 69 U/L (ref 33–130)
BUN: 24 mg/dL (ref 7–25)
CO2: 30 mmol/L (ref 20–32)
Calcium: 9.7 mg/dL (ref 8.6–10.4)
Chloride: 103 mmol/L (ref 98–110)
Creat: 0.86 mg/dL (ref 0.60–0.93)
GFR, Est African American: 78 mL/min/{1.73_m2} (ref >=60)
GFR, Est Non African American: 67 mL/min/{1.73_m2} (ref >=60)
Globulin: 2.8 g/dL (calc) (ref 1.9–3.7)
Glucose, Bld: 98 mg/dL (ref 65–99)
Potassium: 4.8 mmol/L (ref 3.5–5.3)
Sodium: 142 mmol/L (ref 135–146)
Total Bilirubin: 0.4 mg/dL (ref 0.2–1.2)
Total Protein: 7.1 g/dL (ref 6.1–8.1)

## 2017-12-24 MED ORDER — HYDROCHLOROTHIAZIDE 25 MG PO TABS: 25.0000 mg | ORAL_TABLET | Freq: Every day | ORAL | 5 refills | Status: DC

## 2017-12-24 MED ORDER — DICLOFENAC SODIUM 1 % TD GEL: 2.0000 g | Freq: Four times a day (QID) | TRANSDERMAL | 5 refills | Status: DC | PRN

## 2017-12-24 MED ORDER — DICLOFENAC SODIUM 3 % TD GEL: 1.0000 "application " | Freq: Four times a day (QID) | TRANSDERMAL | 5 refills | Status: DC | PRN

## 2017-12-24 NOTE — Patient Instructions (Addendum)
Megan Perez,   Thank you for coming in to clinic today.  1. For your back pain: referral to orthopedic surgery. [facility].  Take your back brace with you.   - Apply diclofenac gel up to four times daily for back pain.  May also use for knee pain.  2. For your dizziness:  This is likely related to vertigo.  You are allergic to the medication we use for vertigo, so please start Epley maneuvers as below   3. I will call you to let you know if we need to continue with cardiology or nephrology (kidney) referrals after your labs today.  - STOP lasix. - INCREASE hydrochlorothiazide to 25 mg once daily.    Wear compression socks even if for only 2 hours per day in afternoon/evening. Let me know if you start getting more swelling off lasix.  Please schedule a follow-up appointment with [name], AGNP. Return in about 3 months (around 03/25/2018) for venous insufficiency.  If you have any other questions or concerns, please feel free to call the clinic or send a message through MyChart. You may also schedule an earlier appointment if necessary.  You will receive a survey after today's visit either digitally by e-mail or paper by [employer]. Your experiences and feedback matter to [place].  Please respond so we know how we are doing as we provide care for you.   [name], DNP, AGNP-BC Adult Gerontology Nurse Practitioner [facility], [facility]  How to Perform the Epley Maneuver The Epley maneuver is an exercise that relieves symptoms of vertigo. Vertigo is the feeling that you or your surroundings are moving when they are not. When you feel vertigo, you may feel like the room is spinning and have trouble walking. Dizziness is a little different than vertigo. When you are dizzy, you may feel unsteady or light-headed. You can do this maneuver at home whenever you have symptoms of vertigo. You can do it up to 3 times a day until your symptoms go away. Even though the Epley  maneuver may relieve your vertigo for a few weeks, it is possible that your symptoms will return. This maneuver relieves vertigo, but it does not relieve dizziness. What are the risks? If it is done correctly, the Epley maneuver is considered safe. Sometimes it can lead to dizziness or nausea that goes away after a short time. If you develop other symptoms, such as changes in vision, weakness, or numbness, stop doing the maneuver and call your health care provider. How to perform the Epley maneuver 1. Sit on the edge of a bed or table with your back straight and your legs extended or hanging over the edge of the bed or table. 2. Turn your head halfway toward the affected ear or side. 3. Lie backward quickly with your head turned until you are lying flat on your back. You may want to position a pillow under your shoulders. 4. Hold this position for 30 seconds. You may experience an attack of vertigo. This is normal. 5. Turn your head to the opposite direction until your unaffected ear is facing the floor. 6. Hold this position for 30 seconds. You may experience an attack of vertigo. This is normal. Hold this position until the vertigo stops. 7. Turn your whole body to the same side as your head. Hold for another 30 seconds. 8. Sit back up. You can repeat this exercise up to 3 times a day. Follow these instructions at home:  After doing the Epley maneuver, you can return to your normal activities.  Ask your health care provider if there is anything you should do at home to prevent vertigo. He or she may recommend that you: ? Keep your head raised (elevated) with two or more pillows while you sleep. ? Do not sleep on the side of your affected ear. ? Get up slowly from bed. ? Avoid sudden movements during the day. ? Avoid extreme head movement, like looking up or bending over. Contact a health care provider if:  Your vertigo gets worse.  You have other symptoms,  including: ? Nausea. ? Vomiting. ? Headache. Get help right away if:  You have vision changes.  You have a severe or worsening headache or neck pain.  You cannot stop vomiting.  You have new numbness or weakness in any part of your body. Summary  Vertigo is the feeling that you or your surroundings are moving when they are not.  The Epley maneuver is an exercise that relieves symptoms of vertigo.  If the Epley maneuver is done correctly, it is considered safe. You can do it up to 3 times a day. This information is not intended to replace advice given to you by your health care provider. Make sure you discuss any questions you have with your health care provider. Document Released: 09/09/2013 Document Revised: 07/25/2016 Document Reviewed: 07/25/2016 Elsevier Interactive Patient Education  2017 [employer]

## 2017-12-24 NOTE — Progress Notes (Signed)
Subjective:    Patient ID: Megan Perez, female    DOB: [birth year], 72 y.o.   MRN: [id number]  Megan Perez is a 72 y.o. female presenting on 12/24/2017 for Back Pain (persistent lower back x 6 mths . Pt states she was took off all pain medication, because of her renal insufficancy. Shes currently taking Tylenlol, but no relief .) and Pelvic Pain (that radiates down the leg. [facility] )   HPI Patient presents today for evaluation of low back pain that has persisted for 6 months.  She was taken off all meloxicam and NSAIDs from Cardiologist Dr. Welton FlakesKhan at an unknown date because of renal insufficiency.  Pt had continued reduced kidney function at last ER visit on 12/13/2017 (CKD Stage III - GFR 51 if confirmed on repeat).  Current OTC medication includes Tylenol 1000 mg 3 times daily, but is not helping.  Patient reports radiating nerve pain into pelvis and shoots down her leg.  Sometimes when she walks it is sharp radiating pain to the front of leg and is "like lightening."  Has cane she uses occasionally to prevent from falling.   - Pt requests referral for evaluation at [facility]opedics [facility].  She was previously evaluated by Dr. Deeann SaintHoward Miller at [facility].  She has a back brace, but does not wear it.  -Patient also notes she occasionally has lightheadedness upon standing.  Resolves after moving around. She reports this was evaluated in ER and was diagnosed as vertigo.  Pt endorses history of vertigo.  Has taken meclizine before, but is allergic to it. She reports she has no dizziness or lightheadedness from lying to sitting.  Increased Creatinine and BUN As noted above, pt with decreased kidney function on past labs.  Pt was started on Lasix from prior MD in [place] prior to Dr. Susann GivensLalonde.  Patient is not currently on potassium supplement.  Has had meloxicam stopped, but this was several weeks prior to ED visit per pt report.  Despite  stopping meloxicam, pt still had Cr elevations and decreased GFR consistent with CKD Stage III.  Hyperlipidemia Requests refill: pravastatin.  Pt is currently tolerating well without side effects.  She reports no myalgias or new arthralgias.  - Pt denies changes in vision, chest tightness/pressure, palpitations, regular shortness of breath, leg pain while walking, leg or arm weakness, and sudden loss of speech or loss of consciousness.  - Pt does endorse shortness of breath after walking up an incline.  She regularly climbs a slight incline after getting her mail daily.  No other shortness of breath with daily exertion or when walking same distance on flat surface.  Social History   Tobacco Use  . Smoking status: Never Smoker  . Smokeless tobacco: Never Used  Substance Use Topics  . Alcohol use: No  . Drug use: No    Review of Systems Per HPI unless specifically indicated above     Objective:    BP 135/65 (BP Location: Right Arm, Patient Position: Sitting, Cuff Size: Large)   Pulse 75   Temp 98.1 F (36.7 C) (Oral)   Ht 5\' 7"  (1.702 m)   Wt 278 lb 3.2 oz (126.2 kg)   BMI 43.57 kg/m   Wt Readings from Last 3 Encounters:  12/24/17 278 lb 3.2 oz (126.2 kg)  12/13/17 280 lb (127 kg)  06/19/17 272 lb (123.4 kg)    Physical Exam  Constitutional: She is oriented to  person, place, and time. She appears well-developed and well-nourished. No distress.  HENT:  Head: Normocephalic and atraumatic.  Neck: Normal range of motion. Neck supple. Carotid bruit is not present.  Cardiovascular: Normal rate, regular rhythm, S1 normal, S2 normal, normal heart sounds and intact distal pulses.  Pulmonary/Chest: Effort normal and breath sounds normal. No respiratory distress.  Musculoskeletal: She exhibits no edema (+2 pitting pedal edema).  Low Back Inspection: Normal appearance, Large body habitus, no spinal deformity, symmetrical. Palpation: No tenderness over spinous processes. Bilateral  lumbar paraspinal muscles tender and with hypertonicity/spasm. ROM: Full active ROM forward flex / back extension, rotation L/R without discomfort Special Testing: Seated positive for reproduced L back pain but negative for radicular pain.  Strength: Bilateral hip flex/ext 5/5, knee flex/ext 5/5, ankle dorsiflex/plantarflex 5/5 Neurovascular: intact distal sensation to light touch.  Neurological: She is alert and oriented to person, place, and time.  Skin: Skin is warm and dry.  Psychiatric: She has a normal mood and affect. Her behavior is normal.  Vitals reviewed.    Results for orders placed or performed during the hospital encounter of 12/13/17  Basic metabolic panel  Result Value Ref Range   Sodium 139 135 - 145 mmol/L   Potassium 3.6 3.5 - 5.1 mmol/L   Chloride 101 101 - 111 mmol/L   CO2 27 22 - 32 mmol/L   Glucose, Bld 108 (H) 65 - 99 mg/dL   BUN 26 (H) 6 - 20 mg/dL   Creatinine, Ser 1.61 (H) 0.44 - 1.00 mg/dL   Calcium 9.4 8.9 - 09.6 mg/dL   GFR calc non Af Amer 44 (L) >60 mL/min   GFR calc Af Amer 51 (L) >60 mL/min   Anion gap 11 5 - 15  CBC  Result Value Ref Range   WBC 6.3 3.6 - 11.0 K/uL   RBC 3.98 3.80 - 5.20 MIL/uL   Hemoglobin 12.0 12.0 - 16.0 g/dL   HCT 04.5 40.9 - 81.1 %   MCV 92.5 80.0 - 100.0 fL   MCH 30.2 26.0 - 34.0 pg   MCHC 32.6 32.0 - 36.0 g/dL   RDW 91.4 (H) 78.2 - 95.6 %   Platelets 340 150 - 440 K/uL  Urinalysis, Complete w Microscopic  Result Value Ref Range   Color, Urine STRAW (A) YELLOW   APPearance CLEAR (A) CLEAR   Specific Gravity, Urine 1.012 1.005 - 1.030   pH 5.0 5.0 - 8.0   Glucose, UA NEGATIVE NEGATIVE mg/dL   Hgb urine dipstick NEGATIVE NEGATIVE   Bilirubin Urine NEGATIVE NEGATIVE   Ketones, ur NEGATIVE NEGATIVE mg/dL   Protein, ur NEGATIVE NEGATIVE mg/dL   Nitrite NEGATIVE NEGATIVE   Leukocytes, UA NEGATIVE NEGATIVE   RBC / HPF 0-5 0 - 5 RBC/hpf   WBC, UA 0-5 0 - 5 WBC/hpf   Bacteria, UA NONE SEEN NONE SEEN   Squamous  Epithelial / LPF 0-5 (A) NONE SEEN   Mucus PRESENT    Hyaline Casts, UA PRESENT   Troponin I  Result Value Ref Range   Troponin I <0.03 <0.03 ng/mL      Assessment & Plan:   Problem List Items Addressed This Visit      Cardiovascular and Mediastinum   Essential hypertension - Primary Blood pressure currently controlled.  Patient being managed with Lasix and has had complications of increased creatinine and BUN with decreased GFR.  Patient is also having increased edema.  Plan: 1.  Possibility of Lasix as a contributor to decreased  kidney function.  Stop Lasix. 2.  Increase hydrochlorothiazide to 25 mg once daily. 3.  Repeat CMP to evaluate kidney function. 4.  Encouraged patient to have adequate hydration. 5.  Follow-up as needed with possible referral to nephrology depending on CMP results.  Continue stop all other nephrotoxic agents including NSAIDs.   Relevant Medications   hydrochlorothiazide (HYDRODIURIL) 25 MG tablet   Other Relevant Orders   COMPLETE METABOLIC PANEL WITH GFR (Completed)    Other Visit Diagnoses    Chronic midline low back pain with left-sided sciatica     Chronic problem with prior workup at [facility].  Patient has had complications with conservative therapy of meloxicam.  Patient is unable to use back brace is prescribed and provided by [facility] in the past.  Now with new left-sided sciatica radiates into left groin and down left leg.  Severe pain with activity causes weakness.  None of these symptoms were duplicated on exam today.  Plan: 1.  Referral to orthopedics at [facility].  Instructed patient to take back brace with her. 2.  Start diclofenac gel 1% application to back 4 times daily as needed for moderate pain. 3.  Follow-up as needed   Relevant Medications   diclofenac sodium (VOLTAREN) 1 % GEL   Other Relevant Orders   Ambulatory referral to Orthopedic Surgery   Elevated serum creatinine     see plan for hypertension above    Relevant Orders   COMPLETE METABOLIC PANEL WITH GFR (Completed)   Vertigo     Patient with dizziness likely related to vertigo given patient has history of vertigo.  Intolerance to medication used for vertigo with meclizine in past.  Plan: 1.  Continue to avoid meclizine now. 2.  Start Epley maneuvers as given in handout. 3.  Follow-up as needed.      Meds ordered this encounter  Medications  . hydrochlorothiazide (HYDRODIURIL) 25 MG tablet    Sig: Take 1 tablet (25 mg total) by mouth daily.    Dispense:  30 tablet    Refill:  5    Order Specific Question:   Supervising Provider    Answer:   Smitty Cords [[id number]]  . DISCONTD: Diclofenac Sodium 3 % GEL    Sig: Place 1 application onto the skin 4 (four) times daily as needed (apply to back and knees for moderate pain).    Dispense:  100 g    Refill:  5    Order Specific Question:   Supervising Provider    Answer:   Smitty Cords [[id number]]  . diclofenac sodium (VOLTAREN) 1 % GEL    Sig: Apply 2 g topically 4 (four) times daily as needed (moderate pain of back and knees).    Dispense:  100 g    Refill:  5    Order Specific Question:   Supervising Provider    Answer:   Smitty Cords [[id number]]    Follow up plan: Return in about 3 months (around 03/25/2018) for venous insufficiency.  Megan Mcardle, DNP, AGPCNP-BC Adult Gerontology Primary Care Nurse Practitioner [facility] 12/24/2017, 11:14 AM

## 2017-12-27 ENCOUNTER — Encounter: Payer: Self-pay | Admitting: Nurse Practitioner

## 2018-03-11 ENCOUNTER — Other Ambulatory Visit: Payer: Self-pay | Admitting: Nurse Practitioner

## 2018-03-11 DIAGNOSIS — Z1231 Encounter for screening mammogram for malignant neoplasm of breast: Secondary | ICD-10-CM

## 2018-03-19 ENCOUNTER — Other Ambulatory Visit: Payer: Self-pay | Admitting: Nurse Practitioner

## 2018-03-19 ENCOUNTER — Other Ambulatory Visit: Payer: Self-pay | Admitting: Family Medicine

## 2018-03-19 DIAGNOSIS — I10 Essential (primary) hypertension: Secondary | ICD-10-CM

## 2018-03-25 ENCOUNTER — Other Ambulatory Visit: Payer: Self-pay

## 2018-03-25 ENCOUNTER — Encounter: Payer: Self-pay | Admitting: Nurse Practitioner

## 2018-03-25 ENCOUNTER — Ambulatory Visit: Payer: Medicare HMO | Admitting: Nurse Practitioner

## 2018-03-25 VITALS — BP 110/63 | HR 76 | Temp 98.1°F | Ht 67.0 in | Wt 282.2 lb

## 2018-03-25 DIAGNOSIS — E782 Mixed hyperlipidemia: Secondary | ICD-10-CM | POA: Diagnosis not present

## 2018-03-25 DIAGNOSIS — R42 Dizziness and giddiness: Secondary | ICD-10-CM

## 2018-03-25 DIAGNOSIS — I10 Essential (primary) hypertension: Secondary | ICD-10-CM

## 2018-03-25 DIAGNOSIS — R7303 Prediabetes: Secondary | ICD-10-CM

## 2018-03-25 DIAGNOSIS — R0602 Shortness of breath: Secondary | ICD-10-CM | POA: Diagnosis not present

## 2018-03-25 MED ORDER — FUROSEMIDE 20 MG PO TABS: 20.0000 mg | ORAL_TABLET | Freq: Every day | ORAL | 3 refills | Status: DC

## 2018-03-25 MED ORDER — HYDROCHLOROTHIAZIDE 12.5 MG PO TABS: 12.5000 mg | ORAL_TABLET | Freq: Every day | ORAL | 3 refills | Status: DC

## 2018-03-25 MED ORDER — POTASSIUM CHLORIDE CRYS ER 20 MEQ PO TBCR: 20.0000 meq | EXTENDED_RELEASE_TABLET | Freq: Two times a day (BID) | ORAL | 3 refills | Status: DC

## 2018-03-25 NOTE — Patient Instructions (Addendum)
Megan Perez,   Thank you for coming in to clinic today.  1. Reduce hydrochlorothiazide (HCTZ) back to 12.5 mg once daily.  You may take 1/2 tablets (use a pill cutter).  2. RESTART furosemide 20 mg once daily (1/2 of your old pill). New refill, only take one. - RESTART potassium 20 meq twice a day with meals. - Foods high in potassium: Sweet potato, White potato, Tomato sauce, Watermelon, Spinach, Beets, Black beans, White beans, Salmon, Edamame, Butternut squash, Swiss chard, Yogurt, banana (less than all of these other foods).  3. Your dizziness is most likely because of low blood pressure.  Please schedule a follow-up appointment with Megan Perez, AGNP. Return in about 6 weeks (around 05/06/2018) for dizziness.  If you have any other questions or concerns, please feel free to call the clinic or send a message through MyChart. You may also schedule an earlier appointment if necessary.  You will receive a survey after today's visit either digitally by e-mail or paper by [employer] mail. Your experiences and feedback matter to us.  Please respond so we know how we are doing as we provide care for you.   Megan McardleLauren Azaryah Oleksy, DNP, AGNP-BC Adult Gerontology Nurse Practitioner [facility], [facility]

## 2018-03-25 NOTE — Progress Notes (Signed)
Subjective:    Patient ID: Megan Perez, female    DOB: [birth year], 72 y.o.   MRN: [id number]  Megan Perez is a 72 y.o. female presenting on 03/25/2018 for Dizziness (possible vertigo) and Hypertension   HPI Dizziness Patient describes dizziness with position changes, worse from sitting to standing than lying to sitting. Denies any sensation of spinning and denies symptoms when in still/stable position either lying, sitting, or standing.   This worsened after increase of hydrochlorothiazide at last visit.  Hypertension - She is not checking BP at home or outside of clinic.    - Current medications: HCTZ 25 mg once daily, lisionpril 20 mg once daily, Potassium 20 meq bid and is tolerating well without side effects - She is symptomatic with postural hypotension, leg swelling. - Pt denies headache, changes in vision, chest tightness/pressure, palpitations, sudden loss of speech or loss of consciousness. - She  reports no regular exercise routine. Has not participated in physical activity requiring walking > 200-300 feet until last week, when she was unable to walk a long distance without having shortness of breath.  Her last EF was performed by Dr. Adrian Blackwater - Her diet is moderate in salt, moderate in fat, and moderate in carbohydrates.   Hyperlipidemia Patient is currently taking pravastatin 10 mg once daily: last lipid check 03/2017 - needs recollect for assessment of pravastatin dose.  Patient is taking without current side effects.  Muscle cramping resolved with change off Lasix.  Pt denies changes in vision, chest tightness/pressure, palpitations, leg pain while walking, leg or arm weakness, and sudden loss of speech or loss of consciousness.   Social History   Tobacco Use  . Smoking status: Never Smoker  . Smokeless tobacco: Never Used  Substance Use Topics  . Alcohol use: No  . Drug use: No   Review of Systems Per HPI unless specifically indicated above    Objective:      BP 110/63 (BP Location: Right Arm, Patient Position: Sitting, Cuff Size: Large)   Pulse 76   Temp 98.1 F (36.7 C) (Oral)   Ht 5\' 7"  (1.702 m)   Wt 282 lb 3.2 oz (128 kg)   BMI 44.20 kg/m   Wt Readings from Last 3 Encounters:  03/25/18 282 lb 3.2 oz (128 kg)  12/24/17 278 lb 3.2 oz (126.2 kg)  12/13/17 280 lb (127 kg)    Physical Exam  Constitutional: She is oriented to person, place, and time. She appears well-developed and well-nourished. No distress.  HENT:  Head: Normocephalic and atraumatic.  Neck: Normal range of motion. Neck supple. Carotid bruit is not present.  Cardiovascular: Normal rate, regular rhythm, S1 normal, S2 normal and intact distal pulses.  Murmur heard.  Systolic murmur is present with a grade of 1/6. Pulmonary/Chest: Effort normal and breath sounds normal. No respiratory distress.  Musculoskeletal: She exhibits edema (+3 pitting pedal edema).  Neurological: She is alert and oriented to person, place, and time.  Skin: Skin is warm and dry. Capillary refill takes less than 2 seconds.  Psychiatric: She has a normal mood and affect. Her behavior is normal. Judgment and thought content normal.  Vitals reviewed.   Review of last Stress test and echocardiogram from Dr. Adrian Blackwater: 03/12/2017: Sestamibi stress test: reversible changes.  Nonequivocal test impression. EF 66% 04/02/2017: Indicates mild mitral, tricuspid, pulmonic, and aortic valve regurgitation and EF 84% with Grade I diastolic dysfunction.    Results for orders placed or performed in visit on  12/24/17  COMPLETE METABOLIC PANEL WITH GFR  Result Value Ref Range   Glucose, Bld 98 65 - 99 mg/dL   BUN 24 7 - 25 mg/dL   Creat 1.61 0.96 - 0.45 mg/dL   GFR, Est Non African American 67 > OR = 60 mL/min/1.52m2   GFR, Est African American 78 > OR = 60 mL/min/1.52m2   BUN/Creatinine Ratio NOT APPLICABLE 6 - 22 (calc)   Sodium 142 135 - 146 mmol/L   Potassium 4.8 3.5 - 5.3 mmol/L   Chloride 103 98 - 110  mmol/L   CO2 30 20 - 32 mmol/L   Calcium 9.7 8.6 - 10.4 mg/dL   Total Protein 7.1 6.1 - 8.1 g/dL   Albumin 4.3 3.6 - 5.1 g/dL   Globulin 2.8 1.9 - 3.7 g/dL (calc)   AG Ratio 1.5 1.0 - 2.5 (calc)   Total Bilirubin 0.4 0.2 - 1.2 mg/dL   Alkaline phosphatase (APISO) 69 33 - 130 U/L   AST 11 10 - 35 U/L   ALT 7 6 - 29 U/L      Assessment & Plan:   Problem List Items Addressed This Visit      Cardiovascular and Mediastinum   Essential hypertension   Relevant Medications   furosemide (LASIX) 20 MG tablet   potassium chloride SA (K-DUR,KLOR-CON) 20 MEQ tablet   hydrochlorothiazide (HYDRODIURIL) 12.5 MG tablet   Other Relevant Orders   COMPLETE METABOLIC PANEL WITH GFR   Lipid panel   Hemoglobin A1c     Other   Prediabetes   Relevant Orders   Hemoglobin A1c    Other Visit Diagnoses    Postural dizziness    -  Primary   Shortness of breath on exertion       Mixed hyperlipidemia       Relevant Medications   furosemide (LASIX) 20 MG tablet   hydrochlorothiazide (HYDRODIURIL) 12.5 MG tablet   Other Relevant Orders   Lipid panel    # Dizziness, shortness of breath, hypertension: Patient with new symptoms of dizziness and shortness of breath on exertion in last several weeks. Medications changed at last hypertension visit may have prompted change in dizziness symptoms.  Patient may have more significant diastolic heart failure without prior diagnosis, given presence of Grade I diastolic dysfunction at last echo with EF 84%. - REDUCE HCTZ back to 12.5 mg once daily - RESUME furosemide 20 mg once daily - RESUME potassium chloride 20 meq twice daily - Consider repeat stress test/echo to evaluate symptom changes and exercise tolerance reduction. - Referral back to cardiology Dr. Blanchard Mane in 6 weeks or sooner if no improvement.  # Prediabetes Previously stable.  No recent A1c recheck.  Labs today.  Followup as needed.  # Hyperlipidemia Previously stable, but not yet to LDL  goal < 100 for average risk adult.  Consider LDL goal < 70 for prediabetes / diabetes risk reduction.  Current status unknown.  Recheck labs.  Continue meds without changes today.  Refills provided. Followup 6 weeks and as needed sooner after labs.    Meds ordered this encounter  Medications  . furosemide (LASIX) 20 MG tablet    Sig: Take 1 tablet (20 mg total) by mouth daily.    Dispense:  30 tablet    Refill:  3    Fill on 04/25/2018    Order Specific Question:   Supervising Provider    Answer:   Smitty Cords [[id number]]  . potassium chloride SA (K-DUR,KLOR-CON) 20  MEQ tablet    Sig: Take 1 tablet (20 mEq total) by mouth 2 (two) times daily.    Dispense:  60 tablet    Refill:  3    Order Specific Question:   Supervising Provider    Answer:   Smitty CordsKARAMALEGOS, ALEXANDER J [[id number]]  . hydrochlorothiazide (HYDRODIURIL) 12.5 MG tablet    Sig: Take 1 tablet (12.5 mg total) by mouth daily.    Dispense:  30 tablet    Refill:  3    Order Specific Question:   Supervising Provider    Answer:   Smitty CordsKARAMALEGOS, ALEXANDER J [[id number]]    Follow up plan: Return in about 6 weeks (around 05/06/2018) for dizziness.  Wilhelmina McardleLauren Arissa Fagin, DNP, AGPCNP-BC Adult Gerontology Primary Care Nurse Practitioner [facility] 03/25/2018, 2:11 PM

## 2018-03-27 ENCOUNTER — Encounter: Payer: Self-pay | Admitting: Nurse Practitioner

## 2018-04-11 DIAGNOSIS — E782 Mixed hyperlipidemia: Secondary | ICD-10-CM | POA: Diagnosis not present

## 2018-04-11 DIAGNOSIS — I10 Essential (primary) hypertension: Secondary | ICD-10-CM | POA: Diagnosis not present

## 2018-04-11 DIAGNOSIS — R7303 Prediabetes: Secondary | ICD-10-CM | POA: Diagnosis not present

## 2018-04-12 LAB — LIPID PANEL
Cholesterol: 159 mg/dL (ref <200)
HDL: 59 mg/dL (ref >50)
LDL Cholesterol (Calc): 83 mg/dL (calc)
Non-HDL Cholesterol (Calc): 100 mg/dL (calc) (ref <130)
Total CHOL/HDL Ratio: 2.7 (calc) (ref <5.0)
Triglycerides: 84 mg/dL (ref <150)

## 2018-04-12 LAB — COMPLETE METABOLIC PANEL WITH GFR
AG Ratio: 1.7 (calc) (ref 1.0–2.5)
ALT: 10 U/L (ref 6–29)
AST: 11 U/L (ref 10–35)
Albumin: 4.3 g/dL (ref 3.6–5.1)
Alkaline phosphatase (APISO): 73 U/L (ref 33–130)
BUN: 21 mg/dL (ref 7–25)
CO2: 30 mmol/L (ref 20–32)
Calcium: 9.5 mg/dL (ref 8.6–10.4)
Chloride: 102 mmol/L (ref 98–110)
Creat: 0.89 mg/dL (ref 0.60–0.93)
GFR, Est African American: 75 mL/min/{1.73_m2} (ref >=60)
GFR, Est Non African American: 64 mL/min/{1.73_m2} (ref >=60)
Globulin: 2.6 g/dL (calc) (ref 1.9–3.7)
Glucose, Bld: 109 mg/dL — ABNORMAL HIGH (ref 65–99)
Potassium: 4.8 mmol/L (ref 3.5–5.3)
Sodium: 140 mmol/L (ref 135–146)
Total Bilirubin: 0.4 mg/dL (ref 0.2–1.2)
Total Protein: 6.9 g/dL (ref 6.1–8.1)

## 2018-04-12 LAB — HEMOGLOBIN A1C
Hgb A1c MFr Bld: 6.2 % of total Hgb — ABNORMAL HIGH (ref <5.7)
Mean Plasma Glucose: 131 (calc)
eAG (mmol/L): 7.3 (calc)

## 2018-04-16 ENCOUNTER — Telehealth: Payer: Self-pay | Admitting: Nurse Practitioner

## 2018-04-16 NOTE — Telephone Encounter (Signed)
The pt was notified. No questions or concerns. 

## 2018-04-16 NOTE — Telephone Encounter (Signed)
Pt called for lab results [phone]

## 2018-04-17 ENCOUNTER — Ambulatory Visit
Admission: RE | Admit: 2018-04-17 | Discharge: 2018-04-17 | Disposition: A | Discharge status: Home / Self Care | Payer: Medicare HMO | Source: Ambulatory Visit | Attending: Nurse Practitioner | Admitting: Nurse Practitioner

## 2018-04-17 ENCOUNTER — Other Ambulatory Visit: Payer: Self-pay | Admitting: Nurse Practitioner

## 2018-04-17 DIAGNOSIS — Z1231 Encounter for screening mammogram for malignant neoplasm of breast: Secondary | ICD-10-CM | POA: Diagnosis not present

## 2018-04-17 DIAGNOSIS — N6310 Unspecified lump in the right breast, unspecified quadrant: Secondary | ICD-10-CM

## 2018-04-17 DIAGNOSIS — R928 Other abnormal and inconclusive findings on diagnostic imaging of breast: Secondary | ICD-10-CM

## 2018-04-23 ENCOUNTER — Ambulatory Visit (INDEPENDENT_AMBULATORY_CARE_PROVIDER_SITE_OTHER): Payer: Medicare HMO

## 2018-04-23 VITALS — BP 132/70 | HR 74 | Temp 98.2°F | Resp 17 | Ht 67.0 in | Wt 278.2 lb

## 2018-04-23 DIAGNOSIS — Z0000 Encounter for general adult medical examination without abnormal findings: Secondary | ICD-10-CM

## 2018-04-23 NOTE — Progress Notes (Signed)
Subjective:   Megan Perez is a 72 y.o. female who presents for Medicare Annual (Subsequent) preventive exaMee Hivesmination.  Review of Systems:   Cardiac Risk Factors include: advanced age (>6055men, 66>65 women);hypertension;dyslipidemia;obesity (BMI >30kg/m2)     Objective:     Vitals: BP 132/70 (BP Location: Left Wrist, Patient Position: Sitting)   Pulse 74   Temp 98.2 F (36.8 C) (Oral)   Resp 17   Ht 5\' 7"  (1.702 m)   Wt 278 lb 3.2 oz (126.2 kg)   BMI 43.57 kg/m   Body mass index is 43.57 kg/m.  Advanced Directives 04/23/2018 06/19/2017 04/17/2017 02/16/2016 11/19/2015 10/04/2015 09/15/2015  Does Patient Have a Medical Advance Directive? No No No No No No No  Would patient like information on creating a medical advance directive? Yes (MAU/Ambulatory/Procedural Areas - Information given) - No - Patient declined Yes - Educational materials given No - patient declined information - No - patient declined information    Tobacco Social History   Tobacco Use  Smoking Status Never Smoker  Smokeless Tobacco Never Used     Counseling given: Not Answered   Clinical Intake:  Pre-visit preparation completed: Yes  Pain : 0-10 Pain Score: 4  Pain Type: Chronic pain Pain Location: Knee Pain Orientation: Left, Right Pain Descriptors / Indicators: Aching Pain Onset: More than a month ago Pain Frequency: Constant     Nutritional Status: BMI > 30  Obese Nutritional Risks: None Diabetes: No  How often do you need to have someone help you when you read instructions, pamphlets, or other written materials from your doctor or pharmacy?: 1 - Never What is the last grade level you completed in school?: associates degree  Interpreter Needed?: No  Information entered by :: Demar Shad,LPN   Past Medical History:  Diagnosis Date  . Anxiety   . Asthma    reactive airway and shortness of breath w/ exertion in hot temperatures  . Bronchitis   . GERD (gastroesophageal reflux disease)   .  Hypertension   . Morbid obesity with BMI of 40.0-44.9, adult (HCC) 04/08/2012  . OA (osteoarthritis)    knee  . Osteoporosis   . Prediabetes   . Venous (peripheral) insufficiency 08/19/2010   Qualifier: Diagnosis of  By: Laural BenesJohnson MD, [place]     Past Surgical History:  Procedure Laterality Date  . ABDOMINAL HYSTERECTOMY    . CARPAL TUNNEL RELEASE Right   . CHOLECYSTECTOMY    . COLONOSCOPY WITH PROPOFOL N/A 06/19/2017   Procedure: COLONOSCOPY WITH PROPOFOL;  Surgeon: Midge MiniumWohl, Darren, MD;  [facility] ENDOSCOPY;  Service: Endoscopy;  Laterality: N/A;  . HEEL SPUR SURGERY    . REPLACEMENT TOTAL KNEE     left  . REPLACEMENT TOTAL KNEE Right   . SHOULDER ARTHROSCOPY WITH OPEN ROTATOR CUFF REPAIR Right 09/15/2015   Procedure: SHOULDER ARTHROSCOPY WITH OPEN ROTATOR CUFF REPAIR;  Surgeon: Deeann SaintHoward Miller, MD;  Location: [facility] ORS;  Service: Orthopedics;  Laterality: Right;  . SHOULDER SURGERY Left    Family History  Adopted: Yes  Problem Relation Age of Onset  . Heart disease Father   . Subarachnoid hemorrhage Mother   . Hypertension Mother   . Subarachnoid hemorrhage Sister   . Hypertension Sister   . Hypertension Sister   . Hypertension Sister   . Healthy Sister   . Breast cancer Neg Hx    Social History   Socioeconomic History  . Marital status: Widowed    Spouse name: Not on file  . Number  of children: Not on file  . Years of education: Not on file  . Highest education level: Associate degree: academic program  Occupational History  . Not on file  Social Needs  . Financial resource strain: Not hard at all  . Food insecurity:    Worry: Never true    Inability: Never true  . Transportation needs:    Medical: No    Non-medical: No  Tobacco Use  . Smoking status: Never Smoker  . Smokeless tobacco: Never Used  Substance and Sexual Activity  . Alcohol use: No  . Drug use: No  . Sexual activity: Yes    Birth control/protection: Surgical  Lifestyle  . Physical activity:      Days per week: 0 days    Minutes per session: 0 min  . Stress: Not at all  Relationships  . Social connections:    Talks on phone: More than three times a week    Gets together: More than three times a week    Attends religious service: More than 4 times per year    Active member of club or organization: No    Attends meetings of clubs or organizations: Never    Relationship status: Widowed  Other Topics Concern  . Not on file  Social History Narrative   Retired.  Lives alone    Outpatient Encounter Medications as of 04/23/2018  Medication Sig  . acetaminophen (TYLENOL) 500 MG tablet Take 500 mg by mouth every 6 (six) hours as needed.  [name] aspirin EC 81 MG tablet Take 81 mg by mouth daily.  . Blood Pressure Monitoring (B-D ASSURE BPM/AUTO WRIST CUFF) MISC 1 Device by Does not apply route daily.  . diclofenac sodium (VOLTAREN) 1 % GEL Apply 2 g topically 4 (four) times daily as needed (moderate pain of back and knees).  . furosemide (LASIX) 20 MG tablet Take 1 tablet (20 mg total) by mouth daily.  . hydrochlorothiazide (HYDRODIURIL) 12.5 MG tablet Take 1 tablet (12.5 mg total) by mouth daily.  [name] lisinopril (PRINIVIL,ZESTRIL) 20 MG tablet TAKE 1 TABLET(20 MG) BY MOUTH DAILY  . pantoprazole (PROTONIX) 40 MG tablet Take 1 tablet (40 mg total) by mouth daily.  . potassium chloride SA (K-DUR,KLOR-CON) 20 MEQ tablet Take 1 tablet (20 mEq total) by mouth 2 (two) times daily.  . pravastatin (PRAVACHOL) 10 MG tablet   . sucralfate (CARAFATE) 1 g tablet TAKE 1 TABLET(1 GRAM) BY MOUTH FOUR TIMES DAILY AT BEDTIME WITH MEALS AS NEEDED   Facility-Administered Encounter Medications as of 04/23/2018  Medication  . influenza  inactive virus vaccine (FLUZONE/FLUARIX) injection 0.5 mL    Activities of Daily Living In your present state of health, do you have any difficulty performing the following activities: 04/23/2018  Hearing? N  Vision? N  Difficulty concentrating or making decisions? N  Walking  or climbing stairs? Y  Comment knee pain   Dressing or bathing? N  Doing errands, shopping? N  Preparing Food and eating ? N  Using the Toilet? N  In the past six months, have you accidently leaked urine? Y  Comment wears panty liners for protection   Do you have problems with loss of bowel control? N  Managing your Medications? N  Managing your Finances? N  Housekeeping or managing your Housekeeping? N  Some recent data might be hidden    Patient Care Team: Galen Manila, NP as PCP - General (Nurse Practitioner) Laurier Nancy, MD as Consulting Physician (Cardiology) [name],  MD (Specialist)    Assessment:   This is a routine wellness examination for [facility].  Exercise Activities and Dietary recommendations Current Exercise Habits: The patient does not participate in regular exercise at present, Exercise limited by: None identified  Goals    . DIET - INCREASE WATER INTAKE     Recommend drinking at least 6-8 glasses of water a day        Fall Risk Fall Risk  04/23/2018 04/17/2017 01/16/2017 02/16/2016 09/22/2013  Falls in the past year? Yes No No No Yes  Number falls in past yr: 1 - - - -  Injury with Fall? No - - - -  Risk for fall due to : - - - - -  Follow up Falls evaluation completed;Falls prevention discussed - - - -   Is the patient's home free of loose throw rugs in walkways, pet beds, electrical cords, etc?   yes      Grab bars in the bathroom? yes      Handrails on the stairs?   no stairs       Adequate lighting?   yes  Timed Get Up and Go performed: Completed in 9 seconds with no use of assistive devices, steady gait. No intervention needed at this time.   Depression Screen PHQ 2/9 Scores 04/23/2018 04/17/2017 03/08/2017 01/16/2017  PHQ - 2 Score 0 0 1 0  PHQ- 9 Score - 1 4 -     Cognitive Function     6CIT Screen 04/23/2018 04/17/2017  What Year? 0 points 0 points  What month? 0 points 0 points  What time? 0 points 0 points  Count back from 20 0  points 0 points  Months in reverse 0 points 0 points  Repeat phrase 2 points 0 points  Total Score 2 0    Immunization History  Administered Date(s) Administered  . Influenza Split 06/28/2012, 06/22/2015, 05/23/2016  . Influenza Whole 08/20/2010  . Influenza, High Dose Seasonal PF 08/06/2013, 06/15/2017  . Influenza, Seasonal, Injecte, Preservative Fre 06/11/2014  . Influenza,inj,Quad PF,6+ Mos 12/10/2014  . Influenza-Unspecified 04/20/2013  . PPD Test 05/01/2016  . Pneumococcal Conjugate-13 06/22/2015  . Pneumococcal Polysaccharide-23 06/28/2012  . Tdap 09/19/2007  . Zoster Recombinat (Shingrix) 03/14/2018    Qualifies for Shingles Vaccine? Yes, done   Screening Tests Health Maintenance  Topic Date Due  . TETANUS/TDAP  09/18/2017  . INFLUENZA VACCINE  04/18/2018  . DEXA SCAN  04/24/2019 (Originally 03/27/2010)  . MAMMOGRAM  04/17/2020  . COLONOSCOPY  06/20/2027  . Hepatitis C Screening  Completed  . PNA vac Low Risk Adult  Completed    Cancer Screenings: Lung: Low Dose CT Chest recommended if Age 26-80 years, 30 pack-year currently smoking OR have quit w/in 15years. Patient does not qualify. Breast:  Up to date on Mammogram? Yes   Up to date of Bone Density/Dexa? No Colorectal: completed 06/19/2017  Additional Screenings:  Hepatitis C Screening: completed 02/16/2016     Plan:    I have personally reviewed and addressed the Medicare Annual Wellness questionnaire and have noted the following in the patient's chart:  A. Medical and social history B. Use of alcohol, tobacco or illicit drugs  C. Current medications and supplements D. Functional ability and status E.  Nutritional status F.  Physical activity G. Advance directives H. List of other physicians I.  Hospitalizations, surgeries, and ER visits in previous 12 months J.  Vitals K. Screenings such as hearing and vision if needed, cognitive and depression  L. Referrals and appointments   In addition, I have  reviewed and discussed with patient certain preventive protocols, quality metrics, and best practice recommendations. A written personalized care plan for preventive services as well as general preventive health recommendations were provided to patient.   Signed,  Marin Roberts, LPN Nurse Health Advisor   Nurse Notes:none

## 2018-04-23 NOTE — Patient Instructions (Signed)
Megan Perez , Thank you for taking time to come for your Medicare Wellness Visit. I appreciate your ongoing commitment to your health goals. Please review the following plan we discussed and let me know if I can assist you in the future.   Screening recommendations/referrals: Colonoscopy: completed 06/19/2017 Mammogram: completed 04/17/2018 Bone Density: due now - declined  Recommended yearly ophthalmology/optometry visit for glaucoma screening and checkup Recommended yearly dental visit for hygiene and checkup  Vaccinations: Influenza vaccine: due 05/2018 Pneumococcal vaccine: completed series  Tdap vaccine:due now, check with your insurance company for coverage  Shingles vaccine: completed   Advanced directives: Advance directive discussed with you today. I have provided a copy for you to complete at home and have notarized. Once this is complete please bring a copy in to our office so we can scan it into your chart.  Conditions/risks identified: Recommend drinking at least 6-8 glasses of water a day   Next appointment: Follow up in one year for your annual wellness exam.    Preventive Care 65 Years and Older, Female Preventive care refers to lifestyle choices and visits with your health care provider that can promote health and wellness. What does preventive care include?  A yearly physical exam. This is also called an annual well check.  Dental exams once or twice a year.  Routine eye exams. Ask your health care provider how often you should have your eyes checked.  Personal lifestyle choices, including:  Daily care of your teeth and gums.  Regular physical activity.  Eating a healthy diet.  Avoiding tobacco and drug use.  Limiting alcohol use.  Practicing safe sex.  Taking low-dose aspirin every day.  Taking vitamin and mineral supplements as recommended by your health care provider. What happens during an annual well check? The services and screenings done by your  health care provider during your annual well check will depend on your age, overall health, lifestyle risk factors, and family history of disease. Counseling  Your health care provider may ask you questions about your:  Alcohol use.  Tobacco use.  Drug use.  Emotional well-being.  Home and relationship well-being.  Sexual activity.  Eating habits.  History of falls.  Memory and ability to understand (cognition).  Work and work Astronomerenvironment.  Reproductive health. Screening  You may have the following tests or measurements:  Height, weight, and BMI.  Blood pressure.  Lipid and cholesterol levels. These may be checked every 5 years, or more frequently if you are over 73 years old.  Skin check.  Lung cancer screening. You may have this screening every year starting at age 73 if you have a 30-pack-year history of smoking and currently smoke or have quit within the past 15 years.  Fecal occult blood test (FOBT) of the stool. You may have this test every year starting at age 73.  Flexible sigmoidoscopy or colonoscopy. You may have a sigmoidoscopy every 5 years or a colonoscopy every 10 years starting at age 73.  Hepatitis C blood test.  Hepatitis B blood test.  Sexually transmitted disease (STD) testing.  Diabetes screening. This is done by checking your blood sugar (glucose) after you have not eaten for a while (fasting). You may have this done every 1-3 years.  Bone density scan. This is done to screen for osteoporosis. You may have this done starting at age 73.  Mammogram. This may be done every 1-2 years. Talk to your health care provider about how often you should have regular mammograms.  Talk with your health care provider about your test results, treatment options, and if necessary, the need for more tests. Vaccines  Your health care provider may recommend certain vaccines, such as:  Influenza vaccine. This is recommended every year.  Tetanus, diphtheria, and  acellular pertussis (Tdap, Td) vaccine. You may need a Td booster every 10 years.  Zoster vaccine. You may need this after age 73.  Pneumococcal 13-valent conjugate (PCV13) vaccine. One dose is recommended after age 100.  Pneumococcal polysaccharide (PPSV23) vaccine. One dose is recommended after age 66. Talk to your health care provider about which screenings and vaccines you need and how often you need them. This information is not intended to replace advice given to you by your health care provider. Make sure you discuss any questions you have with your health care provider. Document Released: 10/01/2015 Document Revised: 05/24/2016 Document Reviewed: 07/06/2015 Elsevier Interactive Patient Education  2017 [place] Prevention in the Home Falls can cause injuries. They can happen to people of all ages. There are many things you can do to make your home safe and to help prevent falls. What can I do on the outside of my home?  Regularly fix the edges of walkways and driveways and fix any cracks.  Remove anything that might make you trip as you walk through a door, such as a raised step or threshold.  Trim any bushes or trees on the path to your home.  Use bright outdoor lighting.  Clear any walking paths of anything that might make someone trip, such as rocks or tools.  Regularly check to see if handrails are loose or broken. Make sure that both sides of any steps have handrails.  Any raised decks and porches should have guardrails on the edges.  Have any leaves, snow, or ice cleared regularly.  Use sand or salt on walking paths during winter.  Clean up any spills in your garage right away. This includes oil or grease spills. What can I do in the bathroom?  Use night lights.  Install grab bars by the toilet and in the tub and shower. Do not use towel bars as grab bars.  Use non-skid mats or decals in the tub or shower.  If you need to sit down in the shower, use  a plastic, non-slip stool.  Keep the floor dry. Clean up any water that spills on the floor as soon as it happens.  Remove soap buildup in the tub or shower regularly.  Attach bath mats securely with double-sided non-slip rug tape.  Do not have throw rugs and other things on the floor that can make you trip. What can I do in the bedroom?  Use night lights.  Make sure that you have a light by your bed that is easy to reach.  Do not use any sheets or blankets that are too big for your bed. They should not hang down onto the floor.  Have a firm chair that has side arms. You can use this for support while you get dressed.  Do not have throw rugs and other things on the floor that can make you trip. What can I do in the kitchen?  Clean up any spills right away.  Avoid walking on wet floors.  Keep items that you use a lot in easy-to-reach places.  If you need to reach something above you, use a strong step stool that has a grab bar.  Keep electrical cords out of the way.  Do not use floor polish or wax that makes floors slippery. If you must use wax, use non-skid floor wax.  Do not have throw rugs and other things on the floor that can make you trip. What can I do with my stairs?  Do not leave any items on the stairs.  Make sure that there are handrails on both sides of the stairs and use them. Fix handrails that are broken or loose. Make sure that handrails are as long as the stairways.  Check any carpeting to make sure that it is firmly attached to the stairs. Fix any carpet that is loose or worn.  Avoid having throw rugs at the top or bottom of the stairs. If you do have throw rugs, attach them to the floor with carpet tape.  Make sure that you have a light switch at the top of the stairs and the bottom of the stairs. If you do not have them, ask someone to add them for you. What else can I do to help prevent falls?  Wear shoes that:  Do not have high heels.  Have  rubber bottoms.  Are comfortable and fit you well.  Are closed at the toe. Do not wear sandals.  If you use a stepladder:  Make sure that it is fully opened. Do not climb a closed stepladder.  Make sure that both sides of the stepladder are locked into place.  Ask someone to hold it for you, if possible.  Clearly mark and make sure that you can see:  Any grab bars or handrails.  First and last steps.  Where the edge of each step is.  Use tools that help you move around (mobility aids) if they are needed. These include:  Canes.  Walkers.  Scooters.  Crutches.  Turn on the lights when you go into a dark area. Replace any light bulbs as soon as they burn out.  Set up your furniture so you have a clear path. Avoid moving your furniture around.  If any of your floors are uneven, fix them.  If there are any pets around you, be aware of where they are.  Review your medicines with your doctor. Some medicines can make you feel dizzy. This can increase your chance of falling. Ask your doctor what other things that you can do to help prevent falls. This information is not intended to replace advice given to you by your health care provider. Make sure you discuss any questions you have with your health care provider. Document Released: 07/01/2009 Document Revised: 02/10/2016 Document Reviewed: 10/09/2014 Elsevier Interactive Patient Education  2017 [employer].

## 2018-04-24 ENCOUNTER — Ambulatory Visit
Admission: RE | Admit: 2018-04-24 | Discharge: 2018-04-24 | Disposition: A | Discharge status: Home / Self Care | Payer: Medicare HMO | Source: Ambulatory Visit | Attending: Nurse Practitioner | Admitting: Nurse Practitioner

## 2018-04-24 DIAGNOSIS — N6310 Unspecified lump in the right breast, unspecified quadrant: Secondary | ICD-10-CM

## 2018-04-24 DIAGNOSIS — R928 Other abnormal and inconclusive findings on diagnostic imaging of breast: Secondary | ICD-10-CM | POA: Diagnosis not present

## 2018-04-24 DIAGNOSIS — N6489 Other specified disorders of breast: Secondary | ICD-10-CM | POA: Diagnosis not present

## 2018-04-29 ENCOUNTER — Other Ambulatory Visit: Payer: Self-pay | Admitting: Nurse Practitioner

## 2018-04-29 DIAGNOSIS — I10 Essential (primary) hypertension: Secondary | ICD-10-CM

## 2018-04-29 MED ORDER — LISINOPRIL 20 MG PO TABS: ORAL_TABLET | ORAL | 3 refills | Status: DC

## 2018-04-29 NOTE — Telephone Encounter (Signed)
Pt called requesting refill on Lisinopril 20mg  [facility]  [place]. Pt call back # is  [phone]

## 2018-07-16 ENCOUNTER — Ambulatory Visit: Payer: Medicare HMO | Admitting: Nurse Practitioner

## 2018-07-16 ENCOUNTER — Other Ambulatory Visit: Payer: Self-pay

## 2018-07-16 ENCOUNTER — Encounter: Payer: Self-pay | Admitting: Nurse Practitioner

## 2018-07-16 VITALS — BP 124/63 | HR 78 | Temp 98.2°F | Ht 67.0 in | Wt 285.0 lb

## 2018-07-16 DIAGNOSIS — Z23 Encounter for immunization: Secondary | ICD-10-CM

## 2018-07-16 DIAGNOSIS — I10 Essential (primary) hypertension: Secondary | ICD-10-CM

## 2018-07-16 DIAGNOSIS — M62830 Muscle spasm of back: Secondary | ICD-10-CM

## 2018-07-16 MED ORDER — FUROSEMIDE 20 MG PO TABS: 10.0000 mg | ORAL_TABLET | Freq: Every day | ORAL | 1 refills | Status: DC

## 2018-07-16 MED ORDER — BACLOFEN 10 MG PO TABS: 10.0000 mg | ORAL_TABLET | Freq: Every day | ORAL | 0 refills | Status: DC

## 2018-07-16 NOTE — Progress Notes (Signed)
Subjective:    Patient ID: Megan Perez, female    DOB: Jul 13, 1946, 72 y.o.   MRN: [id number]  Megan Perez is a 72 y.o. female presenting on 07/16/2018 for Leg Pain (right leg pain on the outer thigh w/ difficulty with movement  x 5 days ); Back Pain (RLQ oain x 5 days ); and Dizziness (dizziness with sudden movement. Pt think its associated with her medications )   HPI Leg pain/Back pain Leg pain started Thursday with sharp mid R lateral thigh when she moved to standing from seated with sharp pressure. This continues constantly with occasional worsening. Back pain also started at same time as leg pain. Back pain is described as a sharp pressure in her spine, aching of R side of back. Patient reports pain worsens when she is leaning over sink.  Feels it centrally in spine around area of L3-L4. - Has taken ibuprofen 400 mg with some relief.  Did have some mild reflux after.   Has had PUD in past with ibuprofen use. - Denies any loss of bowel or bladder function. - Patient has also had neuropathic symptoms to include pins/needles electricity sensation to lower legs.  Dizziness Epley maneuvers are not helping dizziness.  When she starts to walk, she feels lightheaded which lasts for seconds.   She feels no additional dizziness.  Denies that room is spinning or that she is moving within space.  Has difficulty describing her lightheadedness, but seems to be orthostatic in nature.  Social History   Tobacco Use  . Smoking status: Never Smoker  . Smokeless tobacco: Never Used  Substance Use Topics  . Alcohol use: No  . Drug use: No    Review of Systems Per HPI unless specifically indicated above     Objective:    BP 124/63   Pulse 78   Temp 98.2 F (36.8 C) (Oral)   Ht 5\' 7"  (1.702 m)   Wt 285 lb (129.3 kg)   BMI 44.64 kg/m   Wt Readings from Last 3 Encounters:  07/16/18 285 lb (129.3 kg)  04/23/18 278 lb 3.2 oz (126.2 kg)  03/25/18 282 lb 3.2 oz (128 kg)    Physical Exam    Constitutional: She is oriented to person, place, and time. She appears well-developed and well-nourished. No distress.  HENT:  Head: Normocephalic and atraumatic.  Cardiovascular: Normal rate, regular rhythm, S1 normal, S2 normal, normal heart sounds and intact distal pulses.  Pulmonary/Chest: Effort normal and breath sounds normal. No respiratory distress.  Musculoskeletal:  Low Back Inspection: Normal appearance, Large body habitus, no spinal deformity, symmetrical. Palpation: Mild tenderness over spinous processes at level of L3-L4. Bilateral lumbar paraspinal muscles mildly tender and with hypertonicity/spasm. ROM: Full active ROM forward flex / back extension, rotation L/R with mild to moderate discomfort Special Testing: Seated SLR negative for radicular pain bilaterally, but with reproduced localized midline  back pain at level of L3-L4. Standing facet load test positive for midline and R back pain Strength: Bilateral hip flex/ext 5/5, knee flex/ext 5/5, ankle dorsiflex/plantarflex 5/5 Neurovascular: intact distal sensation to light touch   Neurological: She is alert and oriented to person, place, and time.  Skin: Skin is warm and dry.  Psychiatric: She has a normal mood and affect. Her behavior is normal.  Vitals reviewed.    Results for orders placed or performed in visit on 03/25/18  COMPLETE METABOLIC PANEL WITH GFR  Result Value Ref Range   Glucose, Bld 109 (H) 65 -  99 mg/dL   BUN 21 7 - 25 mg/dL   Creat 1.61 0.96 - 0.45 mg/dL   GFR, Est Non African American 64 > OR = 60 mL/min/1.65m2   GFR, Est African American 75 > OR = 60 mL/min/1.38m2   BUN/Creatinine Ratio NOT APPLICABLE 6 - 22 (calc)   Sodium 140 135 - 146 mmol/L   Potassium 4.8 3.5 - 5.3 mmol/L   Chloride 102 98 - 110 mmol/L   CO2 30 20 - 32 mmol/L   Calcium 9.5 8.6 - 10.4 mg/dL   Total Protein 6.9 6.1 - 8.1 g/dL   Albumin 4.3 3.6 - 5.1 g/dL   Globulin 2.6 1.9 - 3.7 g/dL (calc)   AG Ratio 1.7 1.0 - 2.5  (calc)   Total Bilirubin 0.4 0.2 - 1.2 mg/dL   Alkaline phosphatase (APISO) 73 33 - 130 U/L   AST 11 10 - 35 U/L   ALT 10 6 - 29 U/L  Lipid panel  Result Value Ref Range   Cholesterol 159 <200 mg/dL   HDL 59 >40 mg/dL   Triglycerides 84 <981 mg/dL   LDL Cholesterol (Calc) 83 mg/dL (calc)   Total CHOL/HDL Ratio 2.7 <5.0 (calc)   Non-HDL Cholesterol (Calc) 100 <130 mg/dL (calc)  Hemoglobin X9J  Result Value Ref Range   Hgb A1c MFr Bld 6.2 (H) <5.7 % of total Hgb   Mean Plasma Glucose 131 (calc)   eAG (mmol/L) 7.3 (calc)      Assessment & Plan:   Problem List Items Addressed This Visit      Cardiovascular and Mediastinum   Essential hypertension Controlled and at goal.  Likely, patient is experiencing orthostatic hypotension related to medication.  May need to have permissive hypertension to goal < 140/90 if symptoms improve with medication changes.  Plan: 1. STOP hydrochlorothiazide. - CONTINUE Lasix 1/2 tablet 10 mg once daily in morning. - CONTINUE lisinopril 20 mg once daily 2. Patient not taking potassium regularly.  - Recheck BMP for kidney function and potassium after med change. 3. Follow-up as scheduled for hypertension.   Relevant Medications   furosemide (LASIX) 20 MG tablet   Other Relevant Orders   BASIC METABOLIC PANEL WITH GFR    Other Visit Diagnoses    Needs flu shot    Pt > age 56.  Needs annual influenza vaccine.  Plan: 1. Administer high dose fluzone today.    Relevant Orders   Flu vaccine HIGH DOSE PF (Fluzone High dose) (Completed)   Spasm of muscle of lower back     Pain likely self-limited.  Muscle strain possible complicated by standing motion.  Plan:  1. Treat with OTC pain meds (acetaminophen).  Discussed max dosing. 2. Apply heat and/or ice to affected area. 3. May also apply a muscle rub with lidocaine or lidocaine patch after heat or ice. 4. Take muscle relaxer baclofen 10 mg once daily at bedtime.  Cautioned drowsiness. - Discussed  Lyrica - deferred until after trying baclofen 5. Provided low back pain exercises.  Perform on a bed, not on floor. 6. Follow up 4-6 weeks for consideration of imaging if symptoms persist.    Relevant Medications   baclofen (LIORESAL) 10 MG tablet       Meds ordered this encounter  Medications  . furosemide (LASIX) 20 MG tablet    Sig: Take 0.5 tablets (10 mg total) by mouth daily.    Dispense:  45 tablet    Refill:  1    Fill on 04/25/2018  Order Specific Question:   Supervising Provider    Answer:   Smitty Cords [[id number]]  . baclofen (LIORESAL) 10 MG tablet    Sig: Take 1 tablet (10 mg total) by mouth at bedtime.    Dispense:  30 each    Refill:  0    Order Specific Question:   Supervising Provider    Answer:   Smitty Cords [[id number]]    Follow up plan: Return 4-6 weeks if back pain symptoms worsen or fail to improve.  [name], DNP, AGPCNP-BC Adult Gerontology Primary Care Nurse Practitioner [facility] 07/16/2018, 3:29 PM

## 2018-07-16 NOTE — Patient Instructions (Addendum)
Megan Perez,   Thank you for coming in to clinic today.  1. Dizziness is likely from blood pressure medicines and is called orthostatic hypotension - CONTINUE Lasix 1/2 tablet 10 mg once daily in morning. - CONTINUE lisinopril 20 mg once daily - STOP hydrochlorothiazide  2. Labs in clinic in next 7 days to check potassium and kidney function  3. For pain: You have a lumbar (low back) muscle strain. Likely caused by pulled muscle when standing.  - Start taking Tylenol extra strength 1 to 2 tablets every 6-8 hours for aches or fever/chills for next few days as needed.  Do not take more than 3,000 mg in 24 hours from all medicines.  - Use heat and ice.  Apply this for 15 minutes at a time 6-8 times per day.   - Muscle rub (any brand - aspercreme) with lidocaine, lidocaine patch, Biofreeze, or tiger balm for topical pain relief.  Avoid using this with heat and ice to avoid burns. Use after heat. - START muscle relaxer baclofen 10 mg one tablet at bedtime.  This can cause drowsiness, so use caution.  It may be best to only take this at night for helping you during sleep.  Call if it does not make you sleepy and you are needing more relief during the day.  Please schedule a follow-up appointment with [name], AGNP. Return 4-6 weeks if back pain symptoms worsen or fail to improve.  We can do xrays or MRI at that time.  If you have any other questions or concerns, please feel free to call the clinic or send a message through MyChart. You may also schedule an earlier appointment if necessary.  You will receive a survey after today's visit either digitally by e-mail or paper by [employer]. Your experiences and feedback matter to [place].  Please respond so we know how we are doing as we provide care for you.   [name], DNP, AGNP-BC Adult Gerontology Nurse Practitioner [facility], [facility]    Orthostatic Hypotension Orthostatic hypotension is a sudden drop in blood  pressure that happens when you quickly change positions, such as when you get up from a seated or lying position. Blood pressure is a measurement of how strongly, or weakly, your blood is pressing against the walls of your arteries. Arteries are blood vessels that carry blood from your heart throughout your body. When blood pressure is too low, you may not get enough blood to your brain or to the rest of your organs. This can cause weakness, light-headedness, rapid heartbeat, and fainting. This can last for just a few seconds or for up to a few minutes. Orthostatic hypotension is usually not a serious problem. However, if it happens frequently or gets worse, it may be a sign of something more serious. What are the causes? This condition may be caused by:  Sudden changes in posture, such as standing up quickly after you have been sitting or lying down.  Blood loss.  Loss of body fluids (dehydration).  Heart problems.  Hormone (endocrine) problems.  Pregnancy.  Severe infection.  Lack of certain nutrients.  Severe allergic reactions (anaphylaxis).  Certain medicines, such as blood pressure medicine or medicines that make the body lose excess fluids (diuretics). Sometimes, this condition can be caused by not taking medicine as directed, such as taking too much of a certain medicine.  What increases the risk? Certain factors can make you more likely to develop orthostatic hypotension, including:  Age. Risk increases as you get older.  Conditions that affect the heart or the central nervous system.  Taking certain medicines, such as blood pressure medicine or diuretics.  Being pregnant.  What are the signs or symptoms? Symptoms of this condition may include:  Weakness.  Light-headedness.  Dizziness.  Blurred vision.  Fatigue.  Rapid heartbeat.  Fainting, in severe cases.  How is this diagnosed? This condition is diagnosed based on:  Your medical history.  Your  symptoms.  Your blood pressure measurement. Your health care provider will check your blood pressure when you are: ? Lying down. ? Sitting. ? Standing.  A blood pressure reading is recorded as two numbers, such as "120 over 80" (or 120/80). The first ("top") number is called the systolic pressure. It is a measure of the pressure in your arteries as your heart beats. The second ("bottom") number is called the diastolic pressure. It is a measure of the pressure in your arteries when your heart relaxes between beats. Blood pressure is measured in a unit called mm Hg. Healthy blood pressure for adults is 120/80. If your blood pressure is below 90/60, you may be diagnosed with hypotension. Other information or tests that may be used to diagnose orthostatic hypotension include:  Your other vital signs, such as your heart rate and temperature.  Blood tests.  Tilt table test. For this test, you will be safely secured to a table that moves you from a lying position to an upright position. Your heart rhythm and blood pressure will be monitored during the test.  How is this treated? Treatment for this condition may include:  Changing your diet. This may involve eating more salt (sodium) or drinking more water.  Taking medicines to raise your blood pressure.  Changing the dosage of certain medicines you are taking that might be lowering your blood pressure.  Wearing compression stockings. These stockings help to prevent blood clots and reduce swelling in your legs.  In some cases, you may need to go to the hospital for:  Fluid replacement. This means you will receive fluids through an IV tube.  Blood replacement. This means you will receive donated blood through an IV tube (transfusion).  Treating an infection or heart problems, if this applies.  Monitoring. You may need to be monitored while medicines that you are taking wear off.  Follow these instructions at home: Eating and  drinking   Drink enough fluid to keep your urine clear or pale yellow.  Eat a healthy diet and follow instructions from your health care provider about eating or drinking restrictions. A healthy diet includes: ? Fresh fruits and vegetables. ? Whole grains. ? Lean meats. ? Low-fat dairy products.  Eat extra salt only as directed. Do not add extra salt to your diet unless your health care provider told you to do that.  Eat frequent, small meals.  Avoid standing up suddenly after eating. Medicines  Take over-the-counter and prescription medicines only as told by your health care provider. ? Follow instructions from your health care provider about changing the dosage of your current medicines, if this applies. ? Do not stop or adjust any of your medicines on your own. General instructions  Wear compression stockings as told by your health care provider.  Get up slowly from lying down or sitting positions. This gives your blood pressure a chance to adjust.  Avoid hot showers and excessive heat as directed by your health care provider.  Return to your normal activities as  told by your health care provider. Ask your health care provider what activities are safe for you.  Do not use any products that contain nicotine or tobacco, such as cigarettes and e-cigarettes. If you need help quitting, ask your health care provider.  Keep all follow-up visits as told by your health care provider. This is important. Contact a health care provider if:  You vomit.  You have diarrhea.  You have a fever for more than 2-3 days.  You feel more thirsty than usual.  You feel weak and tired. Get help right away if:  You have chest pain.  You have a fast or irregular heartbeat.  You develop numbness in any part of your body.  You cannot move your arms or your legs.  You have trouble speaking.  You become sweaty or feel lightheaded.  You faint.  You feel short of breath.  You have  trouble staying awake.  You feel confused. This information is not intended to replace advice given to you by your health care provider. Make sure you discuss any questions you have with your health care provider. Document Released: 08/25/2002 Document Revised: 05/23/2016 Document Reviewed: 02/25/2016 Elsevier Interactive Patient Education  2018 [employer].    Low Back Pain Exercises See other page with pictures of each exercise.  Start with 1 or 2 of these exercises that you are most comfortable with. Do not do any exercises that cause you significant worsening pain. Some of these may cause some "stretching soreness" but it should go away after you stop the exercise, and get better over time. Gradually increase up to 3-4 exercises as tolerated.  Standing hamstring stretch: Place the heel of your leg on a stool about 15 inches high. Keep your knee straight. Lean forward, bending at the hips until you feel a mild stretch in the back of your thigh. Make sure you do not roll your shoulders and bend at the waist when doing this or you will stretch your lower back instead. Hold the stretch for 15 to 30 seconds. Repeat 3 times. Repeat the same stretch on your other leg.  Cat and camel: Get down on your hands and knees. Let your stomach sag, allowing your back to curve downward. Hold this position for 5 seconds. Then arch your back and hold for 5 seconds. Do 3 sets of 10.  Quadriped Arm/Leg Raises: Get down on your hands and knees. Tighten your abdominal muscles to stiffen your spine. While keeping your abdominals tight, raise one arm and the opposite leg away from you. Hold this position for 5 seconds. Lower your arm and leg slowly and alternate sides. Do this 10 times on each side.  Pelvic tilt: Lie on your back with your knees bent and your feet flat on the floor. Tighten your abdominal muscles and push your lower back into the floor. Hold this position for 5 seconds, then relax. Do 3 sets of  10.  Partial curl: Lie on your back with your knees bent and your feet flat on the floor. Tighten your stomach muscles and flatten your back against the floor. Tuck your chin to your chest. With your hands stretched out in front of you, curl your upper body forward until your shoulders clear the floor. Hold this position for 3 seconds. Don't hold your breath. It helps to breathe out as you lift your shoulders up. Relax. Repeat 10 times. Build to 3 sets of 10. To challenge yourself, clasp your hands behind your head and  keep your elbows out to the side.  Lower trunk rotation: Lie on your back with your knees bent and your feet flat on the floor. Tighten your abdominal muscles and push your lower back into the floor. Keeping your shoulders down flat, gently rotate your legs to one side, then the other as far as you can. Repeat 10 to 20 times.  Single knee to chest stretch: Lie on your back with your legs straight out in front of you. Bring one knee up to your chest and grasp the back of your thigh. Pull your knee toward your chest, stretching your buttock muscle. Hold this position for 15 to 30 seconds and return to the starting position. Repeat 3 times on each side.  Double knee to chest: Lie on your back with your knees bent and your feet flat on the floor. Tighten your abdominal muscles and push your lower back into the floor. Pull both knees up to your chest. Hold for 5 seconds and repeat 10 to 20 times.

## 2018-07-17 ENCOUNTER — Telehealth: Payer: Self-pay

## 2018-07-17 DIAGNOSIS — I10 Essential (primary) hypertension: Secondary | ICD-10-CM | POA: Diagnosis not present

## 2018-07-17 NOTE — Telephone Encounter (Signed)
-----   Message from Galen Manila, NP sent at 07/17/2018  9:19 AM EDT ----- Regarding: Shingrix - dose #2 Patient needs dose #2 Shingrix.  Please call her to let her know she can got to [facility] pharmacy.  Order will be written for her to pickup if she wants to do this.  Leotis Shames

## 2018-07-17 NOTE — Telephone Encounter (Signed)
The pt was notified. She had questions about her co-pay. I gave the patient [facility] phone number and address to call and f/u with them concerning the cost. I also informed the patient to call us back to notify us if she decides to go there so we can write her a prescription to take to the pharmacy. She verbalize understanding.

## 2018-07-18 LAB — BASIC METABOLIC PANEL WITH GFR
BUN: 16 mg/dL (ref 7–25)
CO2: 29 mmol/L (ref 20–32)
Calcium: 9.1 mg/dL (ref 8.6–10.4)
Chloride: 101 mmol/L (ref 98–110)
Creat: 0.91 mg/dL (ref 0.60–0.93)
GFR, Est African American: 73 mL/min/{1.73_m2} (ref >=60)
GFR, Est Non African American: 63 mL/min/{1.73_m2} (ref >=60)
Glucose, Bld: 111 mg/dL — ABNORMAL HIGH (ref 65–99)
Potassium: 4.1 mmol/L (ref 3.5–5.3)
Sodium: 140 mmol/L (ref 135–146)

## 2018-08-05 ENCOUNTER — Other Ambulatory Visit: Payer: Self-pay

## 2018-08-05 DIAGNOSIS — R1084 Generalized abdominal pain: Secondary | ICD-10-CM

## 2018-08-05 DIAGNOSIS — H35033 Hypertensive retinopathy, bilateral: Secondary | ICD-10-CM | POA: Diagnosis not present

## 2018-08-05 DIAGNOSIS — I10 Essential (primary) hypertension: Secondary | ICD-10-CM

## 2018-08-05 DIAGNOSIS — K219 Gastro-esophageal reflux disease without esophagitis: Secondary | ICD-10-CM

## 2018-08-05 DIAGNOSIS — H2513 Age-related nuclear cataract, bilateral: Secondary | ICD-10-CM | POA: Diagnosis not present

## 2018-08-05 DIAGNOSIS — H02882 Meibomian gland dysfunction right lower eyelid: Secondary | ICD-10-CM | POA: Diagnosis not present

## 2018-08-05 DIAGNOSIS — H02885 Meibomian gland dysfunction left lower eyelid: Secondary | ICD-10-CM | POA: Diagnosis not present

## 2018-08-05 MED ORDER — FUROSEMIDE 20 MG PO TABS: 10.0000 mg | ORAL_TABLET | Freq: Every day | ORAL | 0 refills | Status: DC

## 2018-08-05 MED ORDER — SUCRALFATE 1 G PO TABS: 1.0000 g | ORAL_TABLET | Freq: Four times a day (QID) | ORAL | 0 refills | Status: DC

## 2018-08-05 MED ORDER — LISINOPRIL 20 MG PO TABS: ORAL_TABLET | ORAL | 0 refills | Status: DC

## 2018-08-07 ENCOUNTER — Other Ambulatory Visit: Payer: Self-pay | Admitting: Nurse Practitioner

## 2018-08-07 DIAGNOSIS — M62830 Muscle spasm of back: Secondary | ICD-10-CM

## 2018-08-07 MED ORDER — BACLOFEN 10 MG PO TABS: 10.0000 mg | ORAL_TABLET | Freq: Three times a day (TID) | ORAL | 0 refills | Status: DC | PRN

## 2018-08-07 NOTE — Telephone Encounter (Signed)
Patient may not take a higher dose.  Instead, may take it up to every 8 hours.  Caution drowsiness.

## 2018-08-07 NOTE — Telephone Encounter (Signed)
The pt was notified. No questions or concerns. 

## 2018-08-07 NOTE — Telephone Encounter (Signed)
Pt  Called requesting refill on baclofen BUT WANTED DOSE increase. Pt. Wants medication called into [facility]

## 2018-09-19 ENCOUNTER — Other Ambulatory Visit: Payer: Self-pay | Admitting: Nurse Practitioner

## 2018-09-19 DIAGNOSIS — M62830 Muscle spasm of back: Secondary | ICD-10-CM

## 2018-09-19 MED ORDER — BACLOFEN 10 MG PO TABS: 10.0000 mg | ORAL_TABLET | Freq: Three times a day (TID) | ORAL | 2 refills | Status: DC | PRN

## 2018-09-19 NOTE — Telephone Encounter (Signed)
Pt needs a refill on baclofen sent to [facility].  Her call back number is [phone].  She also has a question about fursemide.

## 2018-09-25 ENCOUNTER — Other Ambulatory Visit: Payer: Self-pay | Admitting: Nurse Practitioner

## 2018-09-25 DIAGNOSIS — I10 Essential (primary) hypertension: Secondary | ICD-10-CM

## 2018-09-25 MED ORDER — FUROSEMIDE 20 MG PO TABS: 10.0000 mg | ORAL_TABLET | Freq: Every day | ORAL | 2 refills | Status: DC

## 2018-09-25 NOTE — Telephone Encounter (Signed)
Pt needs a refill on furosemide sent to [facility] in [place] Her call back number is [phone]

## 2018-10-03 ENCOUNTER — Encounter: Payer: Self-pay | Admitting: Nurse Practitioner

## 2018-10-03 ENCOUNTER — Ambulatory Visit
Admission: RE | Admit: 2018-10-03 | Discharge: 2018-10-03 | Disposition: A | Discharge status: Home / Self Care | Payer: Medicare HMO | Source: Ambulatory Visit | Attending: Nurse Practitioner | Admitting: Nurse Practitioner

## 2018-10-03 ENCOUNTER — Other Ambulatory Visit: Payer: Self-pay

## 2018-10-03 ENCOUNTER — Ambulatory Visit: Payer: Medicare HMO | Admitting: Nurse Practitioner

## 2018-10-03 VITALS — BP 148/70 | HR 72 | Temp 98.1°F | Ht 67.0 in | Wt 291.0 lb

## 2018-10-03 DIAGNOSIS — M25561 Pain in right knee: Secondary | ICD-10-CM | POA: Diagnosis not present

## 2018-10-03 DIAGNOSIS — M545 Low back pain: Secondary | ICD-10-CM | POA: Insufficient documentation

## 2018-10-03 DIAGNOSIS — G8929 Other chronic pain: Secondary | ICD-10-CM

## 2018-10-03 DIAGNOSIS — E785 Hyperlipidemia, unspecified: Secondary | ICD-10-CM | POA: Diagnosis not present

## 2018-10-03 DIAGNOSIS — I10 Essential (primary) hypertension: Secondary | ICD-10-CM

## 2018-10-03 DIAGNOSIS — M4310 Spondylolisthesis, site unspecified: Secondary | ICD-10-CM | POA: Diagnosis not present

## 2018-10-03 DIAGNOSIS — M5136 Other intervertebral disc degeneration, lumbar region: Secondary | ICD-10-CM | POA: Diagnosis not present

## 2018-10-03 DIAGNOSIS — M25562 Pain in left knee: Secondary | ICD-10-CM | POA: Diagnosis not present

## 2018-10-03 DIAGNOSIS — R7303 Prediabetes: Secondary | ICD-10-CM | POA: Diagnosis not present

## 2018-10-03 MED ORDER — FUROSEMIDE 20 MG PO TABS: 20.0000 mg | ORAL_TABLET | Freq: Every day | ORAL | 0 refills | Status: DC

## 2018-10-03 MED ORDER — FUROSEMIDE 20 MG PO TABS: 20.0000 mg | ORAL_TABLET | Freq: Every day | ORAL | 2 refills | Status: DC

## 2018-10-03 MED ORDER — PRAVASTATIN SODIUM 10 MG PO TABS: 10.0000 mg | ORAL_TABLET | ORAL | 4 refills | Status: DC

## 2018-10-03 NOTE — Progress Notes (Signed)
Subjective:    Patient ID: Megan Perez, female    DOB: 1945-10-26, 73 y.o.   MRN: [id number]  Megan Perez is a 73 y.o. female presenting on 10/03/2018 for Muscle Pain (muscle spasm in the leg and numbness in the feet ) and Dizziness (pt stop taking this medication x 1 week ago and the dizziness have improved.)   HPI Myalgias - cramp lateral RIGHT leg   Patient noted spasms/pain in lateral Right leg have improved after stopping pravastatin 1 week ago.  Is not needing to take as many baclofen since stopping pravastatin, so she requests to continue off this med today.   - Prior to 3 months ago, patient had tolerated pravastatin well without side effects.    Dizziness Patient noted dizziness improved after stopping lasix   Stopped taking Lasix 1 week ago and has had improved dizziness symptoms.  Patient also stopped pravastatin at the same time.  Lasix - none in about 7 days due to medication supply problem.  Patient states it has been shipped, but patient hasn't gotten it.  She ran out early.  Last visit, furosemide was reduced from 20 mg once daily to 10 mg once daily due to dizziness.  Patient now has increasing swelling, weight gain of 5 lbs in last week and associated numbness in feet.  Low back pain Has severe pain when standing to wash dishes.  Stoops or sits on a stool.  Cannot stand long to do anything.  Walked from [employer] to [place] station and had to stop and rest with walking due to back pain.  Bilateral knee pain RIGHT knee replacement  - done in [facility] Dr. Malka So.  Patient desires repeat visit with orthopedics, but would like referral to closer orthopedic surgeon.  Social History   Tobacco Use  . Smoking status: Never Smoker  . Smokeless tobacco: Never Used  Substance Use Topics  . Alcohol use: No  . Drug use: No    Review of Systems Per HPI unless specifically indicated above     Objective:    BP (!) 148/70 (BP Location: Right Arm, Patient Position:  Sitting, Cuff Size: Large)   Pulse 72   Temp 98.1 F (36.7 C) (Oral)   Ht 5\' 7"  (1.702 m)   Wt 291 lb (132 kg)   BMI 45.58 kg/m   Wt Readings from Last 3 Encounters:  10/03/18 291 lb (132 kg)  07/16/18 285 lb (129.3 kg)  04/23/18 278 lb 3.2 oz (126.2 kg)    Physical Exam Vitals signs reviewed.  Constitutional:      General: She is not in acute distress.    Appearance: She is well-developed.  HENT:     Head: Normocephalic and atraumatic.  Cardiovascular:     Rate and Rhythm: Normal rate and regular rhythm.     Pulses:          Radial pulses are 2+ on the right side and 2+ on the left side.       Posterior tibial pulses are 1+ on the right side and 1+ on the left side.     Heart sounds: Normal heart sounds, S1 normal and S2 normal.  Pulmonary:     Effort: Pulmonary effort is normal. No respiratory distress.     Breath sounds: Normal breath sounds and air entry.  Musculoskeletal:     Right lower leg: No edema.     Left lower leg: No edema.     Comments: Low Back Inspection:  Normal appearance, Large body habitus, no spinal deformity, symmetrical. Palpation: No tenderness over spinous processes. Bilateral lumbar paraspinal muscles tender and with hypertonicity/spasm. ROM: Moderately limited forward flexion due to pain.  AROM back extension, rotation L/R without discomfort. Special Testing: Seated SLR negative for radicular pain bilaterally, but has reproduced midline back pain. Strength: Bilateral hip flex/ext 5/5, knee flex/ext 5/5, ankle dorsiflex/plantarflex 5/5 Neurovascular: intact distal sensation to light touch   RIGHT lateral thigh - no active hypertonicity, pain with palpation  Skin:    General: Skin is warm and dry.     Capillary Refill: Capillary refill takes less than 2 seconds.  Neurological:     Mental Status: She is alert and oriented to person, place, and time.  Psychiatric:        Attention and Perception: Attention normal.        Mood and Affect: Mood and  affect normal.        Behavior: Behavior normal. Behavior is cooperative.    Results for orders placed or performed in visit on 10/03/18  Lipid panel  Result Value Ref Range   Cholesterol 179 <200 mg/dL   HDL 60 >16>50 mg/dL   Triglycerides 71 <109<150 mg/dL   LDL Cholesterol (Calc) 103 (H) mg/dL (calc)   Total CHOL/HDL Ratio 3.0 <5.0 (calc)   Non-HDL Cholesterol (Calc) 119 <130 mg/dL (calc)  COMPLETE METABOLIC PANEL WITH GFR  Result Value Ref Range   Glucose, Bld 100 (H) 65 - 99 mg/dL   BUN 17 7 - 25 mg/dL   Creat 6.040.73 5.400.60 - 9.810.93 mg/dL   GFR, Est Non African American 82 > OR = 60 mL/min/1.3973m2   GFR, Est African American 95 > OR = 60 mL/min/1.6173m2   BUN/Creatinine Ratio NOT APPLICABLE 6 - 22 (calc)   Sodium 142 135 - 146 mmol/L   Potassium 4.5 3.5 - 5.3 mmol/L   Chloride 104 98 - 110 mmol/L   CO2 31 20 - 32 mmol/L   Calcium 9.1 8.6 - 10.4 mg/dL   Total Protein 6.7 6.1 - 8.1 g/dL   Albumin 4.1 3.6 - 5.1 g/dL   Globulin 2.6 1.9 - 3.7 g/dL (calc)   AG Ratio 1.6 1.0 - 2.5 (calc)   Total Bilirubin 0.3 0.2 - 1.2 mg/dL   Alkaline phosphatase (APISO) 62 33 - 130 U/L   AST 12 10 - 35 U/L   ALT 8 6 - 29 U/L  Hemoglobin A1c  Result Value Ref Range   Hgb A1c MFr Bld 5.9 (H) <5.7 % of total Hgb   Mean Plasma Glucose 123 (calc)   eAG (mmol/L) 6.8 (calc)      Assessment & Plan:   Problem List Items Addressed This Visit      Cardiovascular and Mediastinum   Essential hypertension Patient with hypertension today off lasix.  Resume lasix 20 mg once daily. If dizziness returns, patient will need to lower dose to 10 mg once daily and cut tablet in half.  Follow-up 6 weeks to assess efficacy of med resumption and dizziness..   Relevant Medications   pravastatin (PRAVACHOL) 10 MG tablet   furosemide (LASIX) 20 MG tablet   furosemide (LASIX) 20 MG tablet   Other Relevant Orders   COMPLETE METABOLIC PANEL WITH GFR (Completed)     Other   Prediabetes - Primary Patient without recent  assessment of prediabetes.  Due for labs.  Will need LDL goal < 70.  Continue pravastatin, reduce as below.   - Repeat labs. - Encouraged  weight loss with low glycemic diet. - Follow-up 6 weeks prn.   Relevant Medications   pravastatin (PRAVACHOL) 10 MG tablet   Other Relevant Orders   Lipid panel (Completed)   Hemoglobin A1c (Completed)    Other Visit Diagnoses    Hyperlipidemia LDL goal <70     Previously uncontrolled with LDL above goal.  Patient belief that right lateral leg pain is related to statin myalgia.  Patient has no other muscle cramps/aches.  Likely patient is having RIGHT lateral leg cramps due to altered gait from back pain per previous evaluation.  Also cannot exclude hypokalemia with patient stopping lasix at same time as pravastatin.   Plan: 1. Continue pravastatin, but reduce dose to one tablet daily MONDAY, WEDNESDAY, FRIDAY 2. Encouraged weight loss for improved LDL control.  3. Encouraged low glycemic, heart healthy diet. 4. Follow-up 3 months   Relevant Medications   pravastatin (PRAVACHOL) 10 MG tablet   furosemide (LASIX) 20 MG tablet   furosemide (LASIX) 20 MG tablet   Other Relevant Orders   Lipid panel (Completed)   COMPLETE METABOLIC PANEL WITH GFR (Completed)   Chronic pain of both knees     Osteoarthritis bilaterally s/p TKA.  Patient has increased weight significantly since surgery and has new chronic pain with standing.  Desires referral back to orthopedics.  Referral placed.  Follow-up prn.   Relevant Orders   Ambulatory referral to Orthopedic Surgery   Chronic midline low back pain without sciatica     Chronic midline low back pain with limitation of daily activities.  Patient requires sitting to help relieve pain.   Plan: 1. DG spine today to eval chronic pain. 2. Consider future MRI vs neurosurgery referral depending on findings.   3. Discussed with patient desires for future treatment and patient states she would be interested in injections  for pain management.  Less likely to pursue spinal surgery. 4. Follow-up prn.   Relevant Orders   DG Lumbar Spine Complete (Completed)   Anterolisthesis Found on DG spine - referral placed to neurosurgery.        Relevant Orders   Ambulatory referral to Neurosurgery   DDD (degenerative disc disease), lumbar       Relevant Orders   Ambulatory referral to Neurosurgery      Meds ordered this encounter  Medications  . pravastatin (PRAVACHOL) 10 MG tablet    Sig: Take 1 tablet (10 mg total) by mouth every Monday, Wednesday, and Friday.    Dispense:  39 tablet    Refill:  4    Order Specific Question:   Supervising Provider    Answer:   Smitty Cords [[id number]]  . furosemide (LASIX) 20 MG tablet    Sig: Take 1 tablet (20 mg total) by mouth daily.    Dispense:  90 tablet    Refill:  2    Order Specific Question:   Supervising Provider    Answer:   Smitty Cords [[id number]]  . furosemide (LASIX) 20 MG tablet    Sig: Take 1 tablet (20 mg total) by mouth daily for 7 days.    Dispense:  7 tablet    Refill:  0    Short term fill prior to mail order is needed.  Fill even if needing to pay cash not through insurance.    Order Specific Question:   Supervising Provider    Answer:   Smitty Cords [[id number]]    Follow up plan: Return in about 6 weeks (  around 11/14/2018) for hypertension, dizziness.  Wilhelmina McardleLauren Maurio Baize, DNP, AGPCNP-BC Adult Gerontology Primary Care Nurse Practitioner [facility] 10/03/2018, 8:07 AM

## 2018-10-03 NOTE — Patient Instructions (Addendum)
[name],   Thank you for coming in to clinic today.  1. For pravastatin and muscle cramp in right leg: - Take pravastatin 10 mg once on Monday, Wednesday, Friday.  2. For low back pain: - Xrays today to follow up - Recommend weight loss of 15-20 lbs over the next year. This is about 5 lbs per 3 months.  3. Dizziness could be from too much furosemide - For now resume and continue 20 mg tablet.  May need to reduce to 1/2 tablet 10 mg once daily.  Call clinic if you feel you are needing to cut back. - Baclofen can also cause dizziness, so only take if you are really having a muscle cramp.  Please schedule a follow-up appointment with [name], AGNP. Return in about 6 weeks (around 11/14/2018) for hypertension, dizziness.  If you have any other questions or concerns, please feel free to call the clinic or send a message through MyChart. You may also schedule an earlier appointment if necessary.  You will receive a survey after today's visit either digitally by e-mail or paper by [employer]. Your experiences and feedback matter to [place].  Please respond so we know how we are doing as we provide care for you.   [name], DNP, AGNP-BC Adult Gerontology Nurse Practitioner [facility], [facility]  Low Back Pain Exercises See other page with pictures of each exercise.  Start with 1 or 2 of these exercises that you are most comfortable with. Do not do any exercises that cause you significant worsening pain. Some of these may cause some "stretching soreness" but it should go away after you stop the exercise, and get better over time. Gradually increase up to 3-4 exercises as tolerated.  Standing hamstring stretch: Place the heel of your leg on a stool about 15 inches high. Keep your knee straight. Lean forward, bending at the hips until you feel a mild stretch in the back of your thigh. Make sure you do not roll your shoulders and bend at the waist when doing this or you  will stretch your lower back instead. Hold the stretch for 15 to 30 seconds. Repeat 3 times. Repeat the same stretch on your other leg.  Cat and camel: Get down on your hands and knees. Let your stomach sag, allowing your back to curve downward. Hold this position for 5 seconds. Then arch your back and hold for 5 seconds. Do 3 sets of 10.  Quadriped Arm/Leg Raises: Get down on your hands and knees. Tighten your abdominal muscles to stiffen your spine. While keeping your abdominals tight, raise one arm and the opposite leg away from you. Hold this position for 5 seconds. Lower your arm and leg slowly and alternate sides. Do this 10 times on each side.  Pelvic tilt: Lie on your back with your knees bent and your feet flat on the floor. Tighten your abdominal muscles and push your lower back into the floor. Hold this position for 5 seconds, then relax. Do 3 sets of 10.  Partial curl: Lie on your back with your knees bent and your feet flat on the floor. Tighten your stomach muscles and flatten your back against the floor. Tuck your chin to your chest. With your hands stretched out in front of you, curl your upper body forward until your shoulders clear the floor. Hold this position for 3 seconds. Don't hold your breath. It helps to breathe out as you lift your shoulders up. Relax.  Repeat 10 times. Build to 3 sets of 10. To challenge yourself, clasp your hands behind your head and keep your elbows out to the side.  Lower trunk rotation: Lie on your back with your knees bent and your feet flat on the floor. Tighten your abdominal muscles and push your lower back into the floor. Keeping your shoulders down flat, gently rotate your legs to one side, then the other as far as you can. Repeat 10 to 20 times.  Single knee to chest stretch: Lie on your back with your legs straight out in front of you. Bring one knee up to your chest and grasp the back of your thigh. Pull your knee toward your chest, stretching your  buttock muscle. Hold this position for 15 to 30 seconds and return to the starting position. Repeat 3 times on each side.  Double knee to chest: Lie on your back with your knees bent and your feet flat on the floor. Tighten your abdominal muscles and push your lower back into the floor. Pull both knees up to your chest. Hold for 5 seconds and repeat 10 to 20 times.

## 2018-10-04 LAB — COMPLETE METABOLIC PANEL WITH GFR
AG Ratio: 1.6 (calc) (ref 1.0–2.5)
ALT: 8 U/L (ref 6–29)
AST: 12 U/L (ref 10–35)
Albumin: 4.1 g/dL (ref 3.6–5.1)
Alkaline phosphatase (APISO): 62 U/L (ref 33–130)
BUN: 17 mg/dL (ref 7–25)
CO2: 31 mmol/L (ref 20–32)
Calcium: 9.1 mg/dL (ref 8.6–10.4)
Chloride: 104 mmol/L (ref 98–110)
Creat: 0.73 mg/dL (ref 0.60–0.93)
GFR, Est African American: 95 mL/min/{1.73_m2} (ref >=60)
GFR, Est Non African American: 82 mL/min/{1.73_m2} (ref >=60)
Globulin: 2.6 g/dL (calc) (ref 1.9–3.7)
Glucose, Bld: 100 mg/dL — ABNORMAL HIGH (ref 65–99)
Potassium: 4.5 mmol/L (ref 3.5–5.3)
Sodium: 142 mmol/L (ref 135–146)
Total Bilirubin: 0.3 mg/dL (ref 0.2–1.2)
Total Protein: 6.7 g/dL (ref 6.1–8.1)

## 2018-10-04 LAB — LIPID PANEL
Cholesterol: 179 mg/dL (ref <200)
HDL: 60 mg/dL (ref >50)
LDL Cholesterol (Calc): 103 mg/dL (calc) — ABNORMAL HIGH
Non-HDL Cholesterol (Calc): 119 mg/dL (calc) (ref <130)
Total CHOL/HDL Ratio: 3 (calc) (ref <5.0)
Triglycerides: 71 mg/dL (ref <150)

## 2018-10-04 LAB — HEMOGLOBIN A1C
Hgb A1c MFr Bld: 5.9 % of total Hgb — ABNORMAL HIGH (ref <5.7)
Mean Plasma Glucose: 123 (calc)
eAG (mmol/L): 6.8 (calc)

## 2018-10-10 ENCOUNTER — Telehealth: Payer: Self-pay | Admitting: Nurse Practitioner

## 2018-10-10 NOTE — Telephone Encounter (Signed)
Pt called said that the medication pravastatin 10 mg was making her feel woozy.  Pt  Call back [phone]

## 2018-10-10 NOTE — Telephone Encounter (Signed)
Patient started 1/2 furosemide.  Has been on full 20 mg tablet for last several days.  Just took for 2 days and had dizziness, swimmy headedness.  Again, reiterated to patient that because both meds were restarted at the same time, we do not know whether the pravastatin or furosemide is causing her symptoms.  I continue to suspect that furosemide is causing hypotension and dizziness at 20 mg once daily.    Patient to stay on 10 mg furosemide once daily.  Allow symptoms to resolve completely.  After 2-4 weeks, may try pravastatin again and if makes patient dizzy we know it was pravastatin not furosemide.  Patient verbalizes understanding.

## 2018-10-15 DIAGNOSIS — M545 Low back pain: Secondary | ICD-10-CM | POA: Diagnosis not present

## 2018-10-15 DIAGNOSIS — M25561 Pain in right knee: Secondary | ICD-10-CM | POA: Diagnosis not present

## 2018-10-15 DIAGNOSIS — G8929 Other chronic pain: Secondary | ICD-10-CM | POA: Diagnosis not present

## 2018-10-15 DIAGNOSIS — M25562 Pain in left knee: Secondary | ICD-10-CM | POA: Diagnosis not present

## 2018-10-26 DIAGNOSIS — J0100 Acute maxillary sinusitis, unspecified: Secondary | ICD-10-CM | POA: Diagnosis not present

## 2018-10-30 DIAGNOSIS — Z96653 Presence of artificial knee joint, bilateral: Secondary | ICD-10-CM | POA: Diagnosis not present

## 2018-10-30 DIAGNOSIS — M170 Bilateral primary osteoarthritis of knee: Secondary | ICD-10-CM | POA: Diagnosis not present

## 2018-10-30 DIAGNOSIS — I890 Lymphedema, not elsewhere classified: Secondary | ICD-10-CM | POA: Diagnosis not present

## 2018-10-31 ENCOUNTER — Telehealth: Payer: Self-pay | Admitting: Family Medicine

## 2018-10-31 NOTE — Telephone Encounter (Signed)
The pt called stating her Orthopedic doctor thinks her tingling and numbness is coming from the swelling and recommends that you increase the Lasix. The pt states she was taking 40MG  of Lasix prior and doesn't understand why she prescribed 10MG  now. She would like to take the whole 20 MG. Please advise

## 2018-10-31 NOTE — Telephone Encounter (Signed)
After seeing orthopedic doctor, please increase furosemide to 10 mg.  Her call back number is [phone]

## 2018-10-31 NOTE — Telephone Encounter (Signed)
Patient was instructed to take full 20 mg tablet at last appointment in January.  Please ask patient to increase her lasix.

## 2018-11-01 NOTE — Telephone Encounter (Signed)
The pt was notified. No questions or concerns. 

## 2018-11-05 ENCOUNTER — Encounter: Payer: Self-pay | Admitting: Nurse Practitioner

## 2018-11-05 ENCOUNTER — Other Ambulatory Visit: Payer: Self-pay

## 2018-11-05 ENCOUNTER — Ambulatory Visit: Payer: Medicare HMO | Attending: Nurse Practitioner | Admitting: Nurse Practitioner

## 2018-11-05 VITALS — BP 159/84 | HR 74 | Temp 98.1°F | Resp 20 | Ht 67.0 in | Wt 288.0 lb

## 2018-11-05 DIAGNOSIS — Z789 Other specified health status: Secondary | ICD-10-CM | POA: Diagnosis not present

## 2018-11-05 DIAGNOSIS — G8929 Other chronic pain: Secondary | ICD-10-CM | POA: Insufficient documentation

## 2018-11-05 DIAGNOSIS — Z79899 Other long term (current) drug therapy: Secondary | ICD-10-CM | POA: Diagnosis not present

## 2018-11-05 DIAGNOSIS — M899 Disorder of bone, unspecified: Secondary | ICD-10-CM | POA: Diagnosis not present

## 2018-11-05 DIAGNOSIS — M545 Low back pain, unspecified: Secondary | ICD-10-CM | POA: Insufficient documentation

## 2018-11-05 DIAGNOSIS — M25561 Pain in right knee: Secondary | ICD-10-CM | POA: Insufficient documentation

## 2018-11-05 DIAGNOSIS — M5441 Lumbago with sciatica, right side: Secondary | ICD-10-CM

## 2018-11-05 DIAGNOSIS — M25562 Pain in left knee: Secondary | ICD-10-CM

## 2018-11-05 DIAGNOSIS — M79604 Pain in right leg: Secondary | ICD-10-CM | POA: Insufficient documentation

## 2018-11-05 DIAGNOSIS — G894 Chronic pain syndrome: Secondary | ICD-10-CM | POA: Diagnosis not present

## 2018-11-05 NOTE — Progress Notes (Signed)
Safety precautions to be maintained throughout the outpatient stay will include: orient to surroundings, keep bed in low position, maintain call bell within reach at all times, provide assistance with transfer out of bed and ambulation.  

## 2018-11-05 NOTE — Progress Notes (Signed)
Patient's Name: Megan Perez  MRN: [id number]  Referring Provider: Marin Olp, PA-C  DOB: 06-05-1946  PCP: Mikey College, NP  DOS: 11/05/2018  Note by: Dionisio David NP  Service setting: Ambulatory outpatient  Specialty: Interventional Pain Management  Location: [facility] (AMB) Pain Management Facility    Patient type: New Patient    Primary Reason(s) for Visit: Initial Patient Evaluation CC: Back Pain (lower) and Neck Pain  HPI  Megan Perez is a 73 y.o. year old, female patient, who comes today for an initial evaluation. She has Essential hypertension; Venous (peripheral) insufficiency; GERD; Arthritis; Morbid obesity with BMI of 40.0-44.9, adult ([place]); Allergic rhinitis; Status post total right knee replacement; Prediabetes; Mild intermittent asthma without complication; Status post total left knee replacement; Inguinal hernia, left; Degenerative spondylolisthesis; Full thickness rotator cuff tear; Impingement syndrome of shoulder region; Morbid obesity ([place]); Osteoarthrosis, localized, primary, knee; Rotator cuff rupture, complete; Screening for colorectal cancer; Chronic bilateral low back pain with right-sided sciatica (Primary Area of Pain) (R>L); Chronic pain of right lower extremity (Secondary Area of Pain); Chronic pain of both knees (Tertiary Area of Pain) (R>L); Chronic pain syndrome; Pharmacologic therapy; Disorder of skeletal system; and Problems influencing health status on their problem list.. Her primarily concern today is the Back Pain (lower) and Neck Pain  Pain Assessment: Location: Lower Back Radiating: right hip down side of right leg to knee Onset: More than a month ago Duration: Chronic pain Quality: Aching, Sharp, Constant Severity: 7 /10 (subjective, self-reported pain score)  Note: Reported level is compatible with observation. Clinically the patient looks like a 2/10 A 2/10 is viewed as "Mild to Moderate" and described as noticeable and distracting. Impossible to  hide from other people. More frequent flare-ups. Still possible to adapt and function close to normal. It can be very annoying and may have occasional stronger flare-ups. With discipline, patients may get used to it and adapt. Information on the proper use of the pain scale provided to the patient today. When using our objective Pain Scale, levels between 6 and 10/10 are said to belong in an emergency room, as it progressively worsens from a 6/10, described as severely limiting, requiring emergency care not usually available at an outpatient pain management facility. At a 6/10 level, communication becomes difficult and requires great effort. Assistance to reach the emergency department may be required. Facial flushing and profuse sweating along with potentially dangerous increases in heart rate and blood pressure will be evident. Effect on ADL: sleep, prolonged walking, climbings steps Timing: Constant Modifying factors: Lidocaine patch, tyenol, electric blankey BP: (!) 159/84  HR: 74  Onset and Duration: Gradual Cause of pain: Unknown Severity: Getting worse, NAS-11 at its worse: 9/10, NAS-11 at its best: 5/10, NAS-11 now: 7/10 and NAS-11 on the average: 7/10 Timing: Night Aggravating Factors: Prolonged standing and Walking Alleviating Factors: Resting Associated Problems: Numbness, Sweating, Swelling, Temperature changes, Pain that wakes patient up and Pain that does not allow patient to sleep Quality of Pain: Nagging and Uncomfortable Previous Examinations or Tests: X-rays Previous Treatments: Relaxation therapy  The patient comes into the clinics today for the first time for a chronic pain management evaluation.According to the patient her primary area of pain is in her lower back.  She admits the pain is midline.  She denies any previous surgery or interventional therapy.  She has had previous physical therapy however is unable to afford secondary to co-pay.  Physical therapy somewhat  effective.  She has had recent images.  Her  second area of pain is in her right leg.  She admits that she has sharp pains that come and go into the knee.  She denies any numbness, tingling or weakness.  He does have to rest after long periods of standing.  Her third area pain is in her knees.  The right is greater than the left.  She is status post bilateral total knee replacements.  She was recently seen by orthopedist for increased heaviness in her knees.  She was informed that this was fluid and her medication was adjusted by primary care provider.  Today I took the time to provide the patient with information regarding this pain practice. The patient was informed that the practice is divided into two sections: an interventional pain management section, as well as a completely separate and distinct medication management section. I explained that there are procedure days for interventional therapies, and evaluation days for follow-ups and medication management. Because of the amount of documentation required during both, they are kept separated. This means that there is the possibility that she may be scheduled for a procedure on one day, and medication management the next. I have also informed her that because of staffing and facility limitations, this practice will no longer take patients for medication management only. To illustrate the reasons for this, I gave the patient the example of surgeons, and how inappropriate it would be to refer a patient to his/her care, just to write for the post-surgical antibiotics on a surgery done by a different surgeon.   Because interventional pain management is part of the board-certified specialty for the doctors, the patient was informed that joining this practice means that they are open to any and all interventional therapies. I made it clear that this does not mean that they will be forced to have any procedures done. What this means is that I believe  interventional therapies to be essential part of the diagnosis and proper management of chronic pain conditions. Therefore, patients not interested in these interventional alternatives will be better served under the care of a different practitioner.  The patient was also made aware of my Comprehensive Pain Management Safety Guidelines where by joining this practice, they limit all of their nerve blocks and joint injections to those done by our practice, for as long as we are retained to manage their care. Historic Controlled Substance Pharmacotherapy Review  PMP and historical list of controlled substances: Hydromet syrup, hydrocodone/acetaminophen 5/325 mg, hydrocodone/acetaminophen 7.5/325 mg, oxycodone 5 mg,  triazolam 0.25 mg, tramadol 50 mg diazepam 2 mg, oxycodone/acetaminophen 5/325 mg Highest opioid analgesic regimen found: Oxycodone 5 mg 1 to 2 tablets every 3-4 hours (fill date 06/26/2014) oxycodone 55 mg/day Most recent opioid analgesic: None Current opioid analgesics: None Highest recorded MME/day: 84.38 mg/day MME/day: 0 mg/day Medications: The patient did not bring the medication(s) to the appointment, as requested in our "New Patient Package" Pharmacodynamics: Desired effects: Analgesia: The patient reports >50% benefit. Reported improvement in function: The patient reports medication allows her to accomplish basic ADLs. Clinically meaningful improvement in function (CMIF): Sustained CMIF goals met Perceived effectiveness: Described as relatively effective, allowing for increase in activities of daily living (ADL) Undesirable effects: Side-effects or Adverse reactions: None reported Historical Monitoring: The patient  reports no history of drug use. List of all UDS Test(s): No results found for: MDMA, COCAINSCRNUR, PCPSCRNUR, PCPQUANT, CANNABQUANT, THCU, [place] List of all Serum Drug Screening Test(s):  No results found for: AMPHSCRSER, BARBSCRSER, BENZOSCRSER, COCAINSCRSER,  PCPSCRSER, PCPQUANT, THCSCRSER,  CANNABQUANT, OPIATESCRSER, OXYSCRSER, PROPOXSCRSER Historical Background Evaluation: [place] PDMP: Six (6) year initial data search conducted.             [place] Department of public safety, offender search: Editor, commissioning Information) Non-contributory Risk Assessment Profile: Aberrant behavior: None observed or detected today Risk factors for fatal opioid overdose: None identified today Fatal overdose hazard ratio (HR): Calculation deferred Non-fatal overdose hazard ratio (HR): Calculation deferred Risk of opioid abuse or dependence: 0.7-3.0% with doses ? 36 MME/day and 6.1-26% with doses ? 120 MME/day. Substance use disorder (SUD) risk level: Pending results of Medical Psychology Evaluation for SUD Opioid risk tool (ORT) (Total Score): 2  ORT Scoring interpretation table:  Score <3 = Low Risk for SUD  Score between 4-7 = Moderate Risk for SUD  Score >8 = High Risk for Opioid Abuse   PHQ-2 Depression Scale:  Total score: 0  PHQ-2 Scoring interpretation table: (Score and probability of major depressive disorder)  Score 0 = No depression  Score 1 = 15.4% Probability  Score 2 = 21.1% Probability  Score 3 = 38.4% Probability  Score 4 = 45.5% Probability  Score 5 = 56.4% Probability  Score 6 = 78.6% Probability   PHQ-9 Depression Scale:  Total score: 5  PHQ-9 Scoring interpretation table:  Score 0-4 = No depression  Score 5-9 = Mild depression  Score 10-14 = Moderate depression  Score 15-19 = Moderately severe depression  Score 20-27 = Severe depression (2.4 times higher risk of SUD and 2.89 times higher risk of overuse)   Pharmacologic Plan: Pending ordered tests and/or consults  Meds  The patient has a current medication list which includes the following prescription(s): acetaminophen, aspirin ec, azelastine, b-d assure bpm/auto wrist cuff, diclofenac sodium, furosemide, lisinopril, pantoprazole, sucralfate, baclofen, furosemide, potassium chloride sa, and  pravastatin.  Current Outpatient Medications on File Prior to Visit  Medication Sig  . acetaminophen (TYLENOL) 500 MG tablet Take 500 mg by mouth every 6 (six) hours as needed.  [name] aspirin EC 81 MG tablet Take 81 mg by mouth daily.  [name] azelastine (OPTIVAR) 0.05 % ophthalmic solution INT 1 GTT INTO OU BID FOR 10 DAYS  . Blood Pressure Monitoring (B-D ASSURE BPM/AUTO WRIST CUFF) MISC 1 Device by Does not apply route daily.  . diclofenac sodium (VOLTAREN) 1 % GEL Apply 2 g topically 4 (four) times daily as needed (moderate pain of back and knees).  . furosemide (LASIX) 20 MG tablet Take 1 tablet (20 mg total) by mouth daily.  [name] lisinopril (PRINIVIL,ZESTRIL) 20 MG tablet TAKE 1 TABLET(20 MG) BY MOUTH DAILY  . pantoprazole (PROTONIX) 40 MG tablet Take 1 tablet (40 mg total) by mouth daily.  . sucralfate (CARAFATE) 1 g tablet Take 1 tablet (1 g total) by mouth 4 (four) times daily. (Patient taking differently: Take 1 g by mouth daily. )  . baclofen (LIORESAL) 10 MG tablet Take 1 tablet (10 mg total) by mouth 3 (three) times daily as needed for muscle spasms. (Patient not taking: Reported on 11/05/2018)  . furosemide (LASIX) 20 MG tablet Take 1 tablet (20 mg total) by mouth daily for 7 days.  . potassium chloride SA (K-DUR,KLOR-CON) 20 MEQ tablet Take 1 tablet (20 mEq total) by mouth 2 (two) times daily. (Patient not taking: Reported on 07/16/2018)  . pravastatin (PRAVACHOL) 10 MG tablet Take 1 tablet (10 mg total) by mouth every Monday, Wednesday, and Friday. (Patient not taking: Reported on 11/05/2018)   No current facility-administered medications on file prior to  visit.    Imaging Review  Shoulder Imaging:  Results for orders placed during the hospital encounter of 08/26/15  MR Shoulder Right Wo Contrast   Narrative CLINICAL DATA:  Constant pain from the shoulder to the elbow. Decreased range of motion.  EXAM: MRI OF THE RIGHT SHOULDER WITHOUT CONTRAST  TECHNIQUE: Multiplanar, multisequence  MR imaging of the shoulder was performed. No intravenous contrast was administered.  COMPARISON:  08/21/2013  FINDINGS: Rotator cuff: Large full-thickness, near complete, tear of the supraspinatus tendon with 3 cm of retraction. Mild tendinosis of the infraspinatus tendon. Teres minor tendon is intact. Subscapularis tendon is intact.  Muscles: Mild muscle atrophy of the supraspinatus and infraspinatus muscles.  Biceps long head: Severe tendinosis of the intraarticular portion of the long head of the biceps tendon with a partial thickness tear.  Acromioclavicular Joint: Severe degenerative changes of the acromioclavicular joint. Type I acromion.  Glenohumeral Joint: Small joint effusion. No chondral defect. No dislocation.  Labrum: Limited evaluation secondary to lack of intra-articular contrast. Increased signal within the superior labrum consistent with degeneration.  Bones:  No marrow signal abnormality.  No fracture or dislocation.  Soft tissue: Mild edema in the superolateral deltoid muscle adjacent to the acromial insertion. Cystic structure at the musculotendinous junction of the posterior deltoid muscle likely representing a ganglion cyst or interstitial tear.  IMPRESSION: 1. Large full-thickness, near complete, tear of the supraspinatus tendon with 3 cm of retraction. 2. Mild tendinosis of the infraspinatus tendon. 3. Severe tendinosis of the intraarticular portion of the long head of the biceps tendon with a partial thickness tear. 4. Mild supraspinatus and infraspinatus muscle atrophy. 5. Mild deltoid muscle strain laterally near the acromial insertion.   Electronically Signed   By: Kathreen Devoid   On: 08/26/2015 10:19    Shoulder-L MR wo contrast:  Results for orders placed during the hospital encounter of 12/22/12  MR Shoulder Left Wo Contrast   Narrative *RADIOLOGY REPORT*  Clinical Data:  Left shoulder pain for 3 months.  MRI OF THE LEFT SHOULDER  WITHOUT CONTRAST  Technique:  Multiplanar, multisequence MR imaging of the left shoulder was performed.  No intravenous contrast was administered.  Comparison:  None  FINDINGS: Rotator cuff:  The supraspinatus and infraspinatus tendons are completely torn and retracted 4-5 cm, to the level of the glenoid. There is a large ossific fragment in the distal infraspinatus measuring 1.7 cm cranial-caudal by 0.6 cm AP by 2.0 cm transverse. The subscapularis is severely tendinopathic with longitudinal split tearing of its superior fibers. The teres minor is intact. Muscles:  Marked fatty replacement is seen in both the supraspinatus and infraspinatus.  The subscapularis and teres minor are mildly atrophic. Biceps long head:  Marked intrasubstance increased T2 signal in the biceps tendon as it passes in the bicipital interval is consistent with severe tendinosis and partial tearing.  There appear to be some intact fibers present.  Acromioclavicular Joint:  Bulky acromioclavicular degenerative change is present with marrow edema and subchondral cyst formation about the joint. Glenohumeral Joint:  The humeral head is high-riding from the patient's chronic supraspinatus and infraspinatus tears.  Mild to moderate glenohumeral degenerative change is present.  Labrum:  The superior labrum is severely degenerated and torn.  The remainder of the labrum appears intact. Bones:  The acromion is type 1.  There is fluid in the soft/subdeltoid bursa.  IMPRESSION:  1.  Complete supraspinatus and infraspinatus tendon tears with marked retraction and atrophy. 2.  Severe subscapularis tendinopathy with  longitudinal split tearing of its superior fibers. 3.  Severe tendinosis of the long head of biceps.  Intact fibers appear to be present but there is marked interstitial tearing of the tendon as it passes through the bicipital interval. 4.  Severe acromioclavicular osteoarthritis. 5.  Glenohumeral  degenerative change with associated tearing of the superior labrum. 6.  Subacromial/subdeltoid bursitis.   Original Report Authenticated By: Orlean Patten, M.D.   Lumbosacral Imaging:  Results for orders placed during the hospital encounter of 10/03/18  DG Lumbar Spine Complete   Narrative CLINICAL DATA:  Chronic low back pain.  EXAM: LUMBAR SPINE - COMPLETE 4+ VIEW  COMPARISON:  Chest x-ray 12/13/2017.  FINDINGS: Surgical clips right upper quadrant. Lumbar spine numbered the lowest segmented appearing lumbar shaped vertebrae on lateral view as L5. 6 mm anterolisthesis L4 on L5. Diffuse degenerative change lumbar spine and both hips. No acute bony abnormality identified. Pelvic calcifications consistent phleboliths.  IMPRESSION: Diffuse multilevel degenerative change lumbar spine with 6 mm anterolisthesis L4 on L5.   Electronically Signed   By: Marcello Moores  Register   On: 10/03/2018 09:33   Note: Available results from prior imaging studies were reviewed.        ROS  Cardiovascular History: High blood pressure Pulmonary or Respiratory History: Snoring  Neurological History: No reported neurological signs or symptoms such as seizures, abnormal skin sensations, urinary and/or fecal incontinence, being born with an abnormal open spine and/or a tethered spinal cord Review of Past Neurological Studies:  Results for orders placed or performed during the hospital encounter of 10/04/15  CT Head Wo Contrast   Narrative   CLINICAL DATA:  Dizziness, loss of appetite and numbness in both feet for 2 days. Initial encounter.  EXAM: CT HEAD WITHOUT CONTRAST  TECHNIQUE: Contiguous axial images were obtained from the base of the skull through the vertex without intravenous contrast.  COMPARISON:  None.  FINDINGS: The brain appears normal without hemorrhage, infarct, mass lesion, mass effect, midline shift or abnormal extra-axial fluid collection. No hydrocephalus or  pneumocephalus. The calvarium is intact. Imaged paranasal sinuses and mastoid air cells are clear.  IMPRESSION: Negative head CT.   Electronically Signed   By: Inge Rise M.D.   On: 10/04/2015 11:25    Psychological-Psychiatric History: Anxiousness and Attempted suicide Gastrointestinal History: Reflux or heatburn Genitourinary History: No reported renal or genitourinary signs or symptoms such as difficulty voiding or producing urine, peeing blood, non-functioning kidney, kidney stones, difficulty emptying the bladder, difficulty controlling the flow of urine, or chronic kidney disease Hematological History: Brusing easily Endocrine History: No reported endocrine signs or symptoms such as high or low blood sugar, rapid heart rate due to high thyroid levels, obesity or weight gain due to slow thyroid or thyroid disease Rheumatologic History: Joint aches and or swelling due to excess weight (Osteoarthritis) Musculoskeletal History: Negative for myasthenia gravis, muscular dystrophy, multiple sclerosis or malignant hyperthermia Work History: Retired  Allergies  Ms. Shipp is allergic to baclofen; meclizine; other; penicillins; prednisone; prevacid [lansoprazole]; gabapentin; and oxycodone.  Laboratory Chemistry  Inflammation Markers No results found for: CRP, ESRSEDRATE (CRP: Acute Phase) (ESR: Chronic Phase) Renal Function Markers Lab Results  Component Value Date   BUN 17 10/03/2018   CREATININE 0.73 10/03/2018   GFRAA 95 10/03/2018   GFRNONAA 82 10/03/2018   Hepatic Function Markers Lab Results  Component Value Date   AST 12 10/03/2018   ALT 8 10/03/2018   ALBUMIN 3.9 01/16/2017   ALKPHOS 73 01/16/2017   HCVAB  NEGATIVE 02/16/2016   Electrolytes Lab Results  Component Value Date   NA 142 10/03/2018   K 4.5 10/03/2018   CL 104 10/03/2018   CALCIUM 9.1 10/03/2018   Neuropathy Markers No results found for: [id number] Bone Pathology Markers Lab Results   Component Value Date   ALKPHOS 73 01/16/2017   CALCIUM 9.1 10/03/2018   Coagulation Parameters Lab Results  Component Value Date   PLT 340 12/13/2017   Cardiovascular Markers Lab Results  Component Value Date   BNP 17.0 11/19/2015   HGB 12.0 12/13/2017   HCT 36.8 12/13/2017   Note: Lab results reviewed.  PFSH  Drug: Ms. Catino  reports no history of drug use. Alcohol:  reports no history of alcohol use. Tobacco:  reports that she has never smoked. She has never used smokeless tobacco. Medical:  has a past medical history of Anxiety, Asthma, Bronchitis, GERD (gastroesophageal reflux disease), Hypertension, Morbid obesity with BMI of 40.0-44.9, adult ([place]) (04/08/2012), OA (osteoarthritis), Osteoporosis, Prediabetes, Sinus infection, and Venous (peripheral) insufficiency (08/19/2010). Family: family history includes Healthy in her sister; Heart disease in her father; Hypertension in her mother, sister, sister, and sister; Subarachnoid hemorrhage in her mother and sister. She was adopted.  Past Surgical History:  Procedure Laterality Date  . ABDOMINAL HYSTERECTOMY    . CARPAL TUNNEL RELEASE Right   . CHOLECYSTECTOMY    . COLONOSCOPY WITH PROPOFOL N/A 06/19/2017   Procedure: COLONOSCOPY WITH PROPOFOL;  Surgeon: Lucilla Lame, MD;  Location[facility] ENDOSCOPY;  Service: Endoscopy;  Laterality: N/A;  . HEEL SPUR SURGERY    . REPLACEMENT TOTAL KNEE     left  . REPLACEMENT TOTAL KNEE Right   . SHOULDER ARTHROSCOPY WITH OPEN ROTATOR CUFF REPAIR Right 09/15/2015   Procedure: SHOULDER ARTHROSCOPY WITH OPEN ROTATOR CUFF REPAIR;  Surgeon: Earnestine Leys, MD;  Location: [facility] ORS;  Service: Orthopedics;  Laterality: Right;  . SHOULDER SURGERY Left    Active Ambulatory Problems    Diagnosis Date Noted  . Essential hypertension 08/19/2010  . Venous (peripheral) insufficiency 08/19/2010  . GERD 08/19/2010  . Arthritis 11/24/2011  . Morbid obesity with BMI of 40.0-44.9, adult ([place]) 04/08/2012   . Allergic rhinitis 01/21/2014  . Status post total right knee replacement 09/01/2014  . Prediabetes 02/16/2016  . Mild intermittent asthma without complication 42/70/6237  . Status post total left knee replacement 03/08/2017  . Inguinal hernia, left 05/11/2017  . Degenerative spondylolisthesis 08/02/2016  . Full thickness rotator cuff tear 08/31/2015  . Impingement syndrome of shoulder region 08/02/2015  . Morbid obesity ([place]) 05/21/2014  . Osteoarthrosis, localized, primary, knee 04/21/2014  . Rotator cuff rupture, complete 04/21/2014  . Screening for colorectal cancer   . Chronic bilateral low back pain with right-sided sciatica (Primary Area of Pain) (R>L) 11/05/2018  . Chronic pain of right lower extremity (Secondary Area of Pain) 11/05/2018  . Chronic pain of both knees [facility] Area of Pain) (R>L) 11/05/2018  . Chronic pain syndrome 11/05/2018  . Pharmacologic therapy 11/05/2018  . Disorder of skeletal system 11/05/2018  . Problems influencing health status 11/05/2018   Resolved Ambulatory Problems    Diagnosis Date Noted  . No Resolved Ambulatory Problems   Past Medical History:  Diagnosis Date  . Anxiety   . Asthma   . Bronchitis   . GERD (gastroesophageal reflux disease)   . Hypertension   . OA (osteoarthritis)   . Osteoporosis   . Sinus infection    Constitutional Exam  General appearance: Well nourished, well developed, and  well hydrated. In no apparent acute distress Vitals:   11/05/18 1004  BP: (!) 159/84  Pulse: 74  Resp: 20  Temp: 98.1 F (36.7 C)  SpO2: 97%  Weight: 288 lb (130.6 kg)  Height: 5' 7" (1.702 m)   BMI Assessment: Estimated body mass index is 45.11 kg/m as calculated from the following:   Height as of this encounter: 5' 7" (1.702 m).   Weight as of this encounter: 288 lb (130.6 kg).  BMI interpretation table: BMI level Category Range association with higher incidence of chronic pain  <18 kg/m2 Underweight   18.5-24.9 kg/m2 Ideal  body weight   25-29.9 kg/m2 Overweight Increased incidence by 20%  30-34.9 kg/m2 Obese (Class I) Increased incidence by 68%  35-39.9 kg/m2 Severe obesity (Class II) Increased incidence by 136%  >40 kg/m2 Extreme obesity (Class III) Increased incidence by 254%   BMI Readings from Last 4 Encounters:  11/05/18 45.11 kg/m  10/03/18 45.58 kg/m  07/16/18 44.64 kg/m  04/23/18 43.57 kg/m   Wt Readings from Last 4 Encounters:  11/05/18 288 lb (130.6 kg)  10/03/18 291 lb (132 kg)  07/16/18 285 lb (129.3 kg)  04/23/18 278 lb 3.2 oz (126.2 kg)  Psych/Mental status: Alert, oriented x 3 (person, place, & time)       Eyes: PERLA Respiratory: No evidence of acute respiratory distress  Cervical Spine Exam  Inspection: No masses, redness, or swelling Alignment: Symmetrical Functional ROM: Unrestricted ROM      Stability: No instability detected Muscle strength & Tone: Functionally intact Sensory: Unimpaired Palpation: No palpable anomalies              Upper Extremity (UE) Exam    Side: Right upper extremity  Side: Left upper extremity  Inspection: No masses, redness, swelling, or asymmetry. No contractures  Inspection: No masses, redness, swelling, or asymmetry. No contractures  Functional ROM: Unrestricted ROM          Functional ROM: Unrestricted ROM          Muscle strength & Tone: Functionally intact  Muscle strength & Tone: Functionally intact  Sensory: Unimpaired  Sensory: Unimpaired  Palpation: No palpable anomalies              Palpation: No palpable anomalies              Specialized Test(s): Deferred         Specialized Test(s): Deferred          Thoracic Spine Exam  Inspection: No masses, redness, or swelling Alignment: Symmetrical Functional ROM: Unrestricted ROM Stability: No instability detected Sensory: Unimpaired Muscle strength & Tone: No palpable anomalies  Lumbar Spine Exam  Inspection: No masses, redness, or swelling Alignment: Symmetrical Functional ROM:  Decreased ROM      Stability: No instability detected Muscle strength & Tone: Increased muscle tone over affected area Sensory: Movement-associated pain Palpation: Complains of area being tender to palpation       Provocative Tests: Lumbar Hyperextension and rotation test: Unable to perform       Patrick's Maneuver: Negative                    Gait & Posture Assessment  Ambulation: Unassisted Gait: Antalgic Posture: WNL   Lower Extremity Exam    Side: Right lower extremity  Side: Left lower extremity  Inspection: Well-healed scars from previous surgery   Inspection: Well-healed scars from previous surgery   Functional ROM: Decreased ROM  Functional ROM: Decreased ROM          Muscle strength & Tone: Functionally intact  Muscle strength & Tone: Functionally intact  Sensory: Referred pain pattern  Sensory: Unimpaired  Palpation: No palpable anomalies  Palpation: No palpable anomalies   Assessment  Primary Diagnosis & Pertinent Problem List: The primary encounter diagnosis was Chronic bilateral low back pain with right-sided sciatica (Primary Area of Pain) (R>L). Diagnoses of Chronic pain of right lower extremity (Secondary Area of Pain), Chronic pain of both knees (Tertiary Area of Pain) (R>L), Chronic pain syndrome, Pharmacologic therapy, Disorder of skeletal system, and Problems influencing health status were also pertinent to this visit.  Visit Diagnosis: 1. Chronic bilateral low back pain with right-sided sciatica (Primary Area of Pain) (R>L)   2. Chronic pain of right lower extremity (Secondary Area of Pain)   3. Chronic pain of both knees (Tertiary Area of Pain) (R>L)   4. Chronic pain syndrome   5. Pharmacologic therapy   6. Disorder of skeletal system   7. Problems influencing health status    Plan of Care  Initial treatment plan:  Please be advised that as per protocol, today's visit has been an evaluation only. We have not taken over the patient's controlled  substance management.  Problem-specific plan: No problem-specific Assessment & Plan notes found for this encounter.  Ordered Lab-work, Procedure(s), Referral(s), & Consult(s): Orders Placed This Encounter  Procedures  . Compliance Drug Analysis, Ur  . Magnesium  . Vitamin B12  . Sedimentation rate  . 25-Hydroxyvitamin D Lcms D2+D3  . C-reactive protein   Pharmacotherapy: Medications ordered:  No orders of the defined types were placed in this encounter.  Medications administered during this visit: Yohana A. Moren had no medications administered during this visit.   Pharmacotherapy under consideration:  Opioid Analgesics: The patient was informed that there is no guarantee that she would be a candidate for opioid analgesics. The decision will be made following CDC guidelines. This decision will be based on the results of diagnostic studies, as well as Ms. Brendlinger's risk profile.  Membrane stabilizer: To be determined at a later time Muscle relaxant: To be determined at a later time NSAID: To be determined at a later time Other analgesic(s): To be determined at a later time   Interventional therapies under consideration: Ms. Mccutchen was informed that there is no guarantee that she would be a candidate for interventional therapies. The decision will be based on the results of diagnostic studies, as well as Ms. Pouliot's risk profile.  Possible procedure(s): Diagnostic right-sided lumbar epidural steroid injection Diagnostic bilateral lumbar facet nerve block Possible bilateral lumbar facet radiofrequency ablation Diagnostic bilateral genicular nerve blocks Possible bilateral genicular nerve radiofrequency ablation   Provider-requested follow-up: Return for 2nd Visit, w/ Dr. Dossie Arbour.  Future Appointments  Date Time Provider [place]  12/04/2018 11:30 AM Milinda Pointer, MD [facility] None  05/06/2019  8:30 AM [place] ADVISOR [facility] None    Primary Care  Physician: Mikey College, NP Location: [facility] Facility Note by:  Date: 11/05/2018; Time: 12:06 PM  Pain Score Disclaimer: We use the NRS-11 scale. This is a self-reported, subjective measurement of pain severity with only modest accuracy. It is used primarily to identify changes within a particular patient. It must be understood that outpatient pain scales are significantly less accurate that those used for research, where they can be applied under ideal controlled circumstances with minimal exposure to variables. In reality, the score is  likely to be a combination of pain intensity and pain affect, where pain affect describes the degree of emotional arousal or changes in action readiness caused by the sensory experience of pain. Factors such as social and work situation, setting, emotional state, anxiety levels, expectation, and prior pain experience may influence pain perception and show large inter-individual differences that may also be affected by time variables.  Patient instructions provided during this appointment: Patient Instructions   ____________________________________________________________________________________________  Appointment Policy Summary  It is our goal and responsibility to provide the medical community with assistance in the evaluation and management of patients with chronic pain. Unfortunately our resources are limited. Because we do not have an unlimited amount of time, or available appointments, we are required to closely monitor and manage their use. The following rules exist to maximize their use:  Patient's responsibilities: 1. Punctuality:  At what time should I arrive? You should be physically present in our office 30 minutes before your scheduled appointment. Your scheduled appointment is with your assigned healthcare provider. However, it takes 5-10 minutes to be "checked-in", and another 15 minutes for the nurses to do the  admission. If you arrive to our office at the time you were given for your appointment, you will end up being at least 20-25 minutes late to your appointment with the provider. 2. Tardiness:  What happens if I arrive only a few minutes after my scheduled appointment time? You will need to reschedule your appointment. The cutoff is your appointment time. This is why it is so important that you arrive at least 30 minutes before that appointment. If you have an appointment scheduled for 10:00 AM and you arrive at 10:01, you will be required to reschedule your appointment.  3. Plan ahead:  Always assume that you will encounter traffic on your way in. Plan for it. If you are dependent on a driver, make sure they understand these rules and the need to arrive early. 4. Other appointments and responsibilities:  Avoid scheduling any other appointments before or after your pain clinic appointments.  5. Be prepared:  Write down everything that you need to discuss with your healthcare provider and give this information to the admitting nurse. Write down the medications that you will need refilled. Bring your pills and bottles (even the empty ones), to all of your appointments, except for those where a procedure is scheduled. 6. No children or pets:  Find someone to take care of them. It is not appropriate to bring them in. 7. Scheduling changes:  We request "advanced notification" of any changes or cancellations. 8. Advanced notification:  Defined as a time period of more than 24 hours prior to the originally scheduled appointment. This allows for the appointment to be offered to other patients. 9. Rescheduling:  When a visit is rescheduled, it will require the cancellation of the original appointment. For this reason they both fall within the category of "Cancellations".  10. Cancellations:  They require advanced notification. Any cancellation less than 24 hours before the  appointment will be recorded as a  "No Show". 11. No Show:  Defined as an unkept appointment where the patient failed to notify or declare to the practice their intention or inability to keep the appointment.  Corrective process for repeat offenders:  1. Tardiness: Three (3) episodes of rescheduling due to late arrivals will be recorded as one (1) "No Show". 2. Cancellation or reschedule: Three (3) cancellations or rescheduling will be recorded as one (1) "No Show". 3. "  No Shows": Three (3) "No Shows" within a 12 month period will result in discharge from the practice. ____________________________________________________________________________________________   ______________________________________________________________________________________________  Specialty Pain Scale  Introduction:  There are significant differences in how pain is reported. The word pain usually refers to physical pain, but it is also a common synonym of suffering. The medical community uses a scale from 0 (zero) to 10 (ten) to report pain level. Zero (0) is described as "no pain", while ten (10) is described as "the worse pain you can imagine". The problem with this scale is that physical pain is reported along with suffering. Suffering refers to mental pain, or more often yet it refers to any unpleasant feeling, emotion or aversion associated with the perception of harm or threat of harm. It is the psychological component of pain.  Pain Specialists prefer to separate the two components. The pain scale used by this practice is the Verbal Numerical Rating Scale (VNRS-11). This scale is for the physical pain only. DO NOT INCLUDE how your pain psychologically affects you. This scale is for adults 18 years of age and older. It has 11 (eleven) levels. The 1st level is 0/10. This means: "right now, I have no pain". In the context of pain management, it also means: "right now, my physical pain is under control with the current therapy".  General Information:   The scale should reflect your current level of pain. Unless you are specifically asked for the level of your worst pain, or your average pain. If you are asked for one of these two, then it should be understood that it is over the past 24 hours.  Levels 1 (one) through 5 (five) are described below, and can be treated as an outpatient. Ambulatory pain management facilities such as ours are more than adequate to treat these levels. Levels 6 (six) through 10 (ten) are also described below, however, these must be treated as a hospitalized patient. While levels 6 (six) and 7 (seven) may be evaluated at an urgent care facility, levels 8 (eight) through 10 (ten) constitute medical emergencies and as such, they belong in a hospital's emergency department. When having these levels (as described below), do not come to our office. Our facility is not equipped to manage these levels. Go directly to an urgent care facility or an emergency department to be evaluated.  Definitions:  Activities of Daily Living (ADL): Activities of daily living (ADL or ADLs) is a term used in healthcare to refer to people's daily self-care activities. Health professionals often use a person's ability or inability to perform ADLs as a measurement of their functional status, particularly in regard to people post injury, with disabilities and the elderly. There are two ADL levels: Basic and Instrumental. Basic Activities of Daily Living (BADL  or BADLs) consist of self-care tasks that include: Bathing and showering; personal hygiene and grooming (including brushing/combing/styling hair); dressing; Toilet hygiene (getting to the toilet, cleaning oneself, and getting back up); eating and self-feeding (not including cooking or chewing and swallowing); functional mobility, often referred to as "transferring", as measured by the ability to walk, get in and out of bed, and get into and out of a chair; the broader definition (moving from one place to  another while performing activities) is useful for people with different physical abilities who are still able to get around independently. Basic ADLs include the things many people do when they get up in the morning and get ready to go out of the house: get out  of bed, go to the toilet, bathe, dress, groom, and eat. On the average, loss of function typically follows a particular order. Hygiene is the first to go, followed by loss of toilet use and locomotion. The last to go is the ability to eat. When there is only one remaining area in which the person is independent, there is a 62.9% chance that it is eating and only a 3.5% chance that it is hygiene. Instrumental Activities of Daily Living (IADL or IADLs) are not necessary for fundamental functioning, but they let an individual live independently in a community. IADL consist of tasks that include: cleaning and maintaining the house; home establishment and maintenance; care of others (including selecting and supervising caregivers); care of pets; child rearing; managing money; managing financials (investments, etc.); meal preparation and cleanup; shopping for groceries and necessities; moving within the community; safety procedures and emergency responses; health management and maintenance (taking prescribed medications); and using the telephone or other form of communication.  Instructions:  Most patients tend to report their pain as a combination of two factors, their physical pain and their psychosocial pain. This last one is also known as "suffering" and it is reflection of how physical pain affects you socially and psychologically. From now on, report them separately.  From this point on, when asked to report your pain level, report only your physical pain. Use the following table for reference.  Pain Clinic Pain Levels (0-5/10)  Pain Level Score  Description  No Pain 0   Mild pain 1 Nagging, annoying, but does not interfere with basic activities  of daily living (ADL). Patients are able to eat, bathe, get dressed, toileting (being able to get on and off the toilet and perform personal hygiene functions), transfer (move in and out of bed or a chair without assistance), and maintain continence (able to control bladder and bowel functions). Blood pressure and heart rate are unaffected. A normal heart rate for a healthy adult ranges from 60 to 100 bpm (beats per minute).   Mild to moderate pain 2 Noticeable and distracting. Impossible to hide from other people. More frequent flare-ups. Still possible to adapt and function close to normal. It can be very annoying and may have occasional stronger flare-ups. With discipline, patients may get used to it and adapt.   Moderate pain 3 Interferes significantly with activities of daily living (ADL). It becomes difficult to feed, bathe, get dressed, get on and off the toilet or to perform personal hygiene functions. Difficult to get in and out of bed or a chair without assistance. Very distracting. With effort, it can be ignored when deeply involved in activities.   Moderately severe pain 4 Impossible to ignore for more than a few minutes. With effort, patients may still be able to manage work or participate in some social activities. Very difficult to concentrate. Signs of autonomic nervous system discharge are evident: dilated pupils (mydriasis); mild sweating (diaphoresis); sleep interference. Heart rate becomes elevated (>115 bpm). Diastolic blood pressure (lower number) rises above 100 mmHg. Patients find relief in laying down and not moving.   Severe pain 5 Intense and extremely unpleasant. Associated with frowning face and frequent crying. Pain overwhelms the senses.  Ability to do any activity or maintain social relationships becomes significantly limited. Conversation becomes difficult. Pacing back and forth is common, as getting into a comfortable position is nearly impossible. Pain wakes you up from  deep sleep. Physical signs will be obvious: pupillary dilation; increased sweating; goosebumps; brisk reflexes; cold,  clammy hands and feet; nausea, vomiting or dry heaves; loss of appetite; significant sleep disturbance with inability to fall asleep or to remain asleep. When persistent, significant weight loss is observed due to the complete loss of appetite and sleep deprivation.  Blood pressure and heart rate becomes significantly elevated. Caution: If elevated blood pressure triggers a pounding headache associated with blurred vision, then the patient should immediately seek attention at an urgent or emergency care unit, as these may be signs of an impending stroke.    Emergency Department Pain Levels (6-10/10)  Emergency Room Pain 6 Severely limiting. Requires emergency care and should not be seen or managed at an outpatient pain management facility. Communication becomes difficult and requires great effort. Assistance to reach the emergency department may be required. Facial flushing and profuse sweating along with potentially dangerous increases in heart rate and blood pressure will be evident.   Distressing pain 7 Self-care is very difficult. Assistance is required to transport, or use restroom. Assistance to reach the emergency department will be required. Tasks requiring coordination, such as bathing and getting dressed become very difficult.   Disabling pain 8 Self-care is no longer possible. At this level, pain is disabling. The individual is unable to do even the most "basic" activities such as walking, eating, bathing, dressing, transferring to a bed, or toileting. Fine motor skills are lost. It is difficult to think clearly.   Incapacitating pain 9 Pain becomes incapacitating. Thought processing is no longer possible. Difficult to remember your own name. Control of movement and coordination are lost.   The worst pain imaginable 10 At this level, most patients pass out from pain. When this  level is reached, collapse of the autonomic nervous system occurs, leading to a sudden drop in blood pressure and heart rate. This in turn results in a temporary and dramatic drop in blood flow to the brain, leading to a loss of consciousness. Fainting is one of the body's self defense mechanisms. Passing out puts the brain in a calmed state and causes it to shut down for a while, in order to begin the healing process.    Summary: 1. Refer to this scale when providing [place] with your pain level. 2. Be accurate and careful when reporting your pain level. This will help with your care. 3. Over-reporting your pain level will lead to loss of credibility. 4. Even a level of 1/10 means that there is pain and will be treated at our facility. 5. High, inaccurate reporting will be documented as "Symptom Exaggeration", leading to loss of credibility and suspicions of possible secondary gains such as obtaining more narcotics, or wanting to appear disabled, for fraudulent reasons. 6. Only pain levels of 5 or below will be seen at our facility. 7. Pain levels of 6 and above will be sent to the Emergency Department and the appointment cancelled. ______________________________________________________________________________________________   BMI Assessment: Estimated body mass index is 45.11 kg/m as calculated from the following:   Height as of this encounter: 5' 7" (1.702 m).   Weight as of this encounter: 288 lb (130.6 kg).  BMI interpretation table: BMI level Category Range association with higher incidence of chronic pain  <18 kg/m2 Underweight   18.5-24.9 kg/m2 Ideal body weight   25-29.9 kg/m2 Overweight Increased incidence by 20%  30-34.9 kg/m2 Obese (Class I) Increased incidence by 68%  35-39.9 kg/m2 Severe obesity (Class II) Increased incidence by 136%  >40 kg/m2 Extreme obesity (Class III) Increased incidence by 254%   BMI Readings  from Last 4 Encounters:  11/05/18 45.11 kg/m  10/03/18 45.58  kg/m  07/16/18 44.64 kg/m  04/23/18 43.57 kg/m   Wt Readings from Last 4 Encounters:  11/05/18 288 lb (130.6 kg)  10/03/18 291 lb (132 kg)  07/16/18 285 lb (129.3 kg)  04/23/18 278 lb 3.2 oz (126.2 kg)

## 2018-11-05 NOTE — Patient Instructions (Addendum)
____________________________________________________________________________________________  Appointment Policy Summary  It is our goal and responsibility to provide the medical community with assistance in the evaluation and management of patients with chronic pain. Unfortunately our resources are limited. Because we do not have an unlimited amount of time, or available appointments, we are required to closely monitor and manage their use. The following rules exist to maximize their use:  Patient's responsibilities: 1. Punctuality:  At what time should I arrive? You should be physically present in our office 30 minutes before your scheduled appointment. Your scheduled appointment is with your assigned healthcare provider. However, it takes 5-10 minutes to be "checked-in", and another 15 minutes for the nurses to do the admission. If you arrive to our office at the time you were given for your appointment, you will end up being at least 20-25 minutes late to your appointment with the provider. 2. Tardiness:  What happens if I arrive only a few minutes after my scheduled appointment time? You will need to reschedule your appointment. The cutoff is your appointment time. This is why it is so important that you arrive at least 30 minutes before that appointment. If you have an appointment scheduled for 10:00 AM and you arrive at 10:01, you will be required to reschedule your appointment.  3. Plan ahead:  Always assume that you will encounter traffic on your way in. Plan for it. If you are dependent on a driver, make sure they understand these rules and the need to arrive early. 4. Other appointments and responsibilities:  Avoid scheduling any other appointments before or after your pain clinic appointments.  5. Be prepared:  Write down everything that you need to discuss with your healthcare provider and give this information to the admitting nurse. Write down the medications that you will need  refilled. Bring your pills and bottles (even the empty ones), to all of your appointments, except for those where a procedure is scheduled. 6. No children or pets:  Find someone to take care of them. It is not appropriate to bring them in. 7. Scheduling changes:  We request "advanced notification" of any changes or cancellations. 8. Advanced notification:  Defined as a time period of more than 24 hours prior to the originally scheduled appointment. This allows for the appointment to be offered to other patients. 9. Rescheduling:  When a visit is rescheduled, it will require the cancellation of the original appointment. For this reason they both fall within the category of "Cancellations".  10. Cancellations:  They require advanced notification. Any cancellation less than 24 hours before the  appointment will be recorded as a "No Show". 11. No Show:  Defined as an unkept appointment where the patient failed to notify or declare to the practice their intention or inability to keep the appointment.  Corrective process for repeat offenders:  1. Tardiness: Three (3) episodes of rescheduling due to late arrivals will be recorded as one (1) "No Show". 2. Cancellation or reschedule: Three (3) cancellations or rescheduling will be recorded as one (1) "No Show". 3. "No Shows": Three (3) "No Shows" within a 12 month period will result in discharge from the practice. ____________________________________________________________________________________________   ______________________________________________________________________________________________  Specialty Pain Scale  Introduction:  There are significant differences in how pain is reported. The word pain usually refers to physical pain, but it is also a common synonym of suffering. The medical community uses a scale from 0 (zero) to 10 (ten) to report pain level. Zero (0) is described as "no pain",   while ten (10) is described as "the worse pain  you can imagine". The problem with this scale is that physical pain is reported along with suffering. Suffering refers to mental pain, or more often yet it refers to any unpleasant feeling, emotion or aversion associated with the perception of harm or threat of harm. It is the psychological component of pain.  Pain Specialists prefer to separate the two components. The pain scale used by this practice is the Verbal Numerical Rating Scale (VNRS-11). This scale is for the physical pain only. DO NOT INCLUDE how your pain psychologically affects you. This scale is for adults 21 years of age and older. It has 11 (eleven) levels. The 1st level is 0/10. This means: "right now, I have no pain". In the context of pain management, it also means: "right now, my physical pain is under control with the current therapy".  General Information:  The scale should reflect your current level of pain. Unless you are specifically asked for the level of your worst pain, or your average pain. If you are asked for one of these two, then it should be understood that it is over the past 24 hours.  Levels 1 (one) through 5 (five) are described below, and can be treated as an outpatient. Ambulatory pain management facilities such as ours are more than adequate to treat these levels. Levels 6 (six) through 10 (ten) are also described below, however, these must be treated as a hospitalized patient. While levels 6 (six) and 7 (seven) may be evaluated at an urgent care facility, levels 8 (eight) through 10 (ten) constitute medical emergencies and as such, they belong in a hospital's emergency department. When having these levels (as described below), do not come to our office. Our facility is not equipped to manage these levels. Go directly to an urgent care facility or an emergency department to be evaluated.  Definitions:  Activities of Daily Living (ADL): Activities of daily living (ADL or ADLs) is a term used in healthcare to refer to  people's daily self-care activities. Health professionals often use a person's ability or inability to perform ADLs as a measurement of their functional status, particularly in regard to people post injury, with disabilities and the elderly. There are two ADL levels: Basic and Instrumental. Basic Activities of Daily Living (BADL  or BADLs) consist of self-care tasks that include: Bathing and showering; personal hygiene and grooming (including brushing/combing/styling hair); dressing; Toilet hygiene (getting to the toilet, cleaning oneself, and getting back up); eating and self-feeding (not including cooking or chewing and swallowing); functional mobility, often referred to as "transferring", as measured by the ability to walk, get in and out of bed, and get into and out of a chair; the broader definition (moving from one place to another while performing activities) is useful for people with different physical abilities who are still able to get around independently. Basic ADLs include the things many people do when they get up in the morning and get ready to go out of the house: get out of bed, go to the toilet, bathe, dress, groom, and eat. On the average, loss of function typically follows a particular order. Hygiene is the first to go, followed by loss of toilet use and locomotion. The last to go is the ability to eat. When there is only one remaining area in which the person is independent, there is a 62.9% chance that it is eating and only a 3.5% chance that it is hygiene. Instrumental Activities   of Daily Living (IADL or IADLs) are not necessary for fundamental functioning, but they let an individual live independently in a community. IADL consist of tasks that include: cleaning and maintaining the house; home establishment and maintenance; care of others (including selecting and supervising caregivers); care of pets; child rearing; managing money; managing financials (investments, etc.); meal preparation  and cleanup; shopping for groceries and necessities; moving within the community; safety procedures and emergency responses; health management and maintenance (taking prescribed medications); and using the telephone or other form of communication.  Instructions:  Most patients tend to report their pain as a combination of two factors, their physical pain and their psychosocial pain. This last one is also known as "suffering" and it is reflection of how physical pain affects you socially and psychologically. From now on, report them separately.  From this point on, when asked to report your pain level, report only your physical pain. Use the following table for reference.  Pain Clinic Pain Levels (0-5/10)  Pain Level Score  Description  No Pain 0   Mild pain 1 Nagging, annoying, but does not interfere with basic activities of daily living (ADL). Patients are able to eat, bathe, get dressed, toileting (being able to get on and off the toilet and perform personal hygiene functions), transfer (move in and out of bed or a chair without assistance), and maintain continence (able to control bladder and bowel functions). Blood pressure and heart rate are unaffected. A normal heart rate for a healthy adult ranges from 60 to 100 bpm (beats per minute).   Mild to moderate pain 2 Noticeable and distracting. Impossible to hide from other people. More frequent flare-ups. Still possible to adapt and function close to normal. It can be very annoying and may have occasional stronger flare-ups. With discipline, patients may get used to it and adapt.   Moderate pain 3 Interferes significantly with activities of daily living (ADL). It becomes difficult to feed, bathe, get dressed, get on and off the toilet or to perform personal hygiene functions. Difficult to get in and out of bed or a chair without assistance. Very distracting. With effort, it can be ignored when deeply involved in activities.   Moderately severe pain  4 Impossible to ignore for more than a few minutes. With effort, patients may still be able to manage work or participate in some social activities. Very difficult to concentrate. Signs of autonomic nervous system discharge are evident: dilated pupils (mydriasis); mild sweating (diaphoresis); sleep interference. Heart rate becomes elevated (>115 bpm). Diastolic blood pressure (lower number) rises above 100 mmHg. Patients find relief in laying down and not moving.   Severe pain 5 Intense and extremely unpleasant. Associated with frowning face and frequent crying. Pain overwhelms the senses.  Ability to do any activity or maintain social relationships becomes significantly limited. Conversation becomes difficult. Pacing back and forth is common, as getting into a comfortable position is nearly impossible. Pain wakes you up from deep sleep. Physical signs will be obvious: pupillary dilation; increased sweating; goosebumps; brisk reflexes; cold, clammy hands and feet; nausea, vomiting or dry heaves; loss of appetite; significant sleep disturbance with inability to fall asleep or to remain asleep. When persistent, significant weight loss is observed due to the complete loss of appetite and sleep deprivation.  Blood pressure and heart rate becomes significantly elevated. Caution: If elevated blood pressure triggers a pounding headache associated with blurred vision, then the patient should immediately seek attention at an urgent or emergency care unit, as   these may be signs of an impending stroke.    Emergency Department Pain Levels (6-10/10)  Emergency Room Pain 6 Severely limiting. Requires emergency care and should not be seen or managed at an outpatient pain management facility. Communication becomes difficult and requires great effort. Assistance to reach the emergency department may be required. Facial flushing and profuse sweating along with potentially dangerous increases in heart rate and blood pressure  will be evident.   Distressing pain 7 Self-care is very difficult. Assistance is required to transport, or use restroom. Assistance to reach the emergency department will be required. Tasks requiring coordination, such as bathing and getting dressed become very difficult.   Disabling pain 8 Self-care is no longer possible. At this level, pain is disabling. The individual is unable to do even the most "basic" activities such as walking, eating, bathing, dressing, transferring to a bed, or toileting. Fine motor skills are lost. It is difficult to think clearly.   Incapacitating pain 9 Pain becomes incapacitating. Thought processing is no longer possible. Difficult to remember your own name. Control of movement and coordination are lost.   The worst pain imaginable 10 At this level, most patients pass out from pain. When this level is reached, collapse of the autonomic nervous system occurs, leading to a sudden drop in blood pressure and heart rate. This in turn results in a temporary and dramatic drop in blood flow to the brain, leading to a loss of consciousness. Fainting is one of the body's self defense mechanisms. Passing out puts the brain in a calmed state and causes it to shut down for a while, in order to begin the healing process.    Summary: 1. Refer to this scale when providing [place] with your pain level. 2. Be accurate and careful when reporting your pain level. This will help with your care. 3. Over-reporting your pain level will lead to loss of credibility. 4. Even a level of 1/10 means that there is pain and will be treated at our facility. 5. High, inaccurate reporting will be documented as "Symptom Exaggeration", leading to loss of credibility and suspicions of possible secondary gains such as obtaining more narcotics, or wanting to appear disabled, for fraudulent reasons. 6. Only pain levels of 5 or below will be seen at our facility. 7. Pain levels of 6 and above will be sent to the  Emergency Department and the appointment cancelled. ______________________________________________________________________________________________   BMI Assessment: Estimated body mass index is 45.11 kg/m as calculated from the following:   Height as of this encounter: 5\' 7"  (1.702 m).   Weight as of this encounter: 288 lb (130.6 kg).  BMI interpretation table: BMI level Category Range association with higher incidence of chronic pain  <18 kg/m2 Underweight   18.5-24.9 kg/m2 Ideal body weight   25-29.9 kg/m2 Overweight Increased incidence by 20%  30-34.9 kg/m2 Obese (Class I) Increased incidence by 68%  35-39.9 kg/m2 Severe obesity (Class II) Increased incidence by 136%  >40 kg/m2 Extreme obesity (Class III) Increased incidence by 254%   BMI Readings from Last 4 Encounters:  11/05/18 45.11 kg/m  10/03/18 45.58 kg/m  07/16/18 44.64 kg/m  04/23/18 43.57 kg/m   Wt Readings from Last 4 Encounters:  11/05/18 288 lb (130.6 kg)  10/03/18 291 lb (132 kg)  07/16/18 285 lb (129.3 kg)  04/23/18 278 lb 3.2 oz (126.2 kg)

## 2018-11-09 LAB — MAGNESIUM: Magnesium: 2.1 mg/dL (ref 1.6–2.3)

## 2018-11-09 LAB — 25-HYDROXY VITAMIN D LCMS D2+D3
25-Hydroxy, Vitamin D-2: <1 ng/mL
25-Hydroxy, Vitamin D-3: 13 ng/mL
25-Hydroxy, Vitamin D: 13 ng/mL — ABNORMAL LOW

## 2018-11-09 LAB — VITAMIN B12: Vitamin B-12: 468 pg/mL (ref 232–1245)

## 2018-11-09 LAB — SEDIMENTATION RATE: Sed Rate: 30 mm/hr (ref 0–40)

## 2018-11-09 LAB — C-REACTIVE PROTEIN: CRP: 11 mg/L — ABNORMAL HIGH (ref 0–10)

## 2018-11-09 LAB — COMPLIANCE DRUG ANALYSIS, UR

## 2018-12-01 NOTE — Progress Notes (Signed)
Patient's Name: Megan Perez  MRN: [id number]  Referring Provider: Mikey College, *  DOB: July 14, 1946  PCP: Mikey College, NP  DOS: 12/04/2018  Note by: Gaspar Cola, MD  Service setting: Ambulatory outpatient  Specialty: Interventional Pain Management  Location: [facility] (AMB) Pain Management Facility    Patient type: Established   Primary Reason(s) for Visit: Encounter for evaluation before starting new chronic pain management plan of care (Level of risk: moderate) CC: Back Pain  HPI  Megan Perez is a 73 y.o. year old, female patient, who comes today for a follow-up evaluation to review the test results and decide on a treatment plan. She has Essential hypertension; Venous (peripheral) insufficiency; GERD; Arthritis; Morbid obesity with BMI of 40.0-44.9, adult ([place]); Allergic rhinitis; Prediabetes; Mild intermittent asthma without complication; History of total knee replacement (Right); Inguinal hernia, left; Degenerative spondylolisthesis; Full thickness rotator cuff tear; Impingement syndrome of shoulder region; Osteoarthrosis, localized, primary, knee; Rotator cuff rupture, complete; Screening for colorectal cancer; Chronic low back pain (Primary area of Pain) (Bilateral) (R>L) w/ sciatica (Right); Chronic lower extremity pain (Secondary Area of Pain) (Right); Chronic knee pain (Tertiary Area of Pain) (Bilateral) (R>L); Chronic pain syndrome; Pharmacologic therapy; Disorder of skeletal system; Problems influencing health status; History of knee replacement, total (Left); Chronic knee pain after total knee replacement (Bilateral); Vitamin D deficiency; Chronic sacroiliac joint pain (Bilateral) (R>L); Lumbar facet syndrome (Bilateral) (R>L); and Lumbar (6 mm) Anterolisthesis of L4/L5 on their problem list. Her primarily concern today is the Back Pain  Pain Assessment: Location: Lower Back(increased pain in left shoulder) Radiating: pain radiaties down right hip and leg, toes on both  foot become numb Onset: More than a month ago Duration: Chronic pain Quality: Aching, Sharp, Numbness, Constant Severity: 7 /10 (subjective, self-reported pain score)  Note: Reported level is inconsistent with clinical observations. Clinically the patient looks like a 3/10 A 3/10 is viewed as "Moderate" and described as significantly interfering with activities of daily living (ADL). It becomes difficult to feed, bathe, get dressed, get on and off the toilet or to perform personal hygiene functions. Difficult to get in and out of bed or a chair without assistance. Very distracting. With effort, it can be ignored when deeply involved in activities. Information on the proper use of the pain scale provided to the patient today. When using our objective Pain Scale, levels between 6 and 10/10 are said to belong in an emergency room, as it progressively worsens from a 6/10, described as severely limiting, requiring emergency care not usually available at an outpatient pain management facility. At a 6/10 level, communication becomes difficult and requires great effort. Assistance to reach the emergency department may be required. Facial flushing and profuse sweating along with potentially dangerous increases in heart rate and blood pressure will be evident. Effect on ADL: no prolonged walking or climbing steps, unable to sleep at night due to pain Timing: Constant Modifying factors: rest, tylenol,  BP: (!) 155/72  HR: 71  Megan Perez comes in today for a follow-up visit after her initial evaluation on 11/05/2018. Today we went over the results of her tests. These were explained in "Layman's terms". During today's appointment we went over my diagnostic impression, as well as the proposed treatment plan.  According to the patient her primary area of pain is in her lower back (ML ->R).  She admits the pain is midline.  She denies any previous surgery or interventional therapy.  She has had previous physical  therapy  however is unable to afford secondary to co-pay.  Physical therapy somewhat effective.  She has had recent images.  Her second area of pain is in her right leg (R).  She admits that she has sharp pains that come and go into the knee, thru the side of the leg down the lateral calf. Numbness in the foot, over the top (L5).  She denies any numbness, tingling or weakness.  He does have to rest after long periods of standing.  Her third area pain is in her knees (B) (R>L).  The right is greater than the left.  She is s/p bilateral total knee replacements (L-2010 & R-2016).  She was recently seen by orthopedist for increased heaviness in her knees.  In considering the treatment plan options, Megan Perez was reminded that I no longer take patients for medication management only. I asked her to let me know if she had no intention of taking advantage of the interventional therapies, so that we could make arrangements to provide this space to someone interested. I also made it clear that undergoing interventional therapies for the purpose of getting pain medications is very inappropriate on the part of a patient, and it will not be tolerated in this practice. This type of behavior would suggest true addiction and therefore it requires referral to an addiction specialist.   Further details on both, my assessment(s), as well as the proposed treatment plan, please see below.  Controlled Substance Pharmacotherapy Assessment REMS (Risk Evaluation and Mitigation Strategy)  Analgesic: None Highest recorded MME/day: 84.38 mg/day MME/day: 0 mg/day  Pill Count: None expected due to no prior prescriptions written by our practice. No notes on file Pharmacokinetics: Liberation and absorption (onset of action): WNL Distribution (time to peak effect): WNL Metabolism and excretion (duration of action): WNL         Pharmacodynamics: Desired effects: Analgesia: Megan Perez reports >50% benefit. Functional  ability: Patient reports that medication allows her to accomplish basic ADLs Clinically meaningful improvement in function (CMIF): Sustained CMIF goals met Perceived effectiveness: Described as relatively effective, allowing for increase in activities of daily living (ADL) Undesirable effects: Side-effects or Adverse reactions: None reported Monitoring: [place] PMP: Online review of the past 71-monthperiod previously conducted. Not applicable at this point since we have not taken over the patient's medication management yet. List of other Serum/Urine Drug Screening Test(s):  No results found. List of all UDS test(s) done:  Lab Results  Component Value Date   SUMMARY FINAL 11/05/2018   Last UDS on record: Summary  Date Value Ref Range Status  11/05/2018 FINAL  Final    Comment:    ==================================================================== TOXASSURE COMP DRUG ANALYSIS,UR ==================================================================== Test                             Result       Flag       Units Drug Present and Declared for Prescription Verification   Acetaminophen                  PRESENT      EXPECTED Drug Absent but Declared for Prescription Verification   Baclofen                       Not Detected UNEXPECTED   Diclofenac                     Not Detected UNEXPECTED  Topical diclofenac, as indicated in the declared medication list,    is not always detected even when used as directed.   Salicylate                     Not Detected UNEXPECTED    Aspirin, as indicated in the declared medication list, is not    always detected even when used as directed. ==================================================================== Test                      Result    Flag   Units      Ref Range   Creatinine              93               mg/dL      >=20 ==================================================================== Declared Medications:  The flagging and interpretation on  this report are based on the  following declared medications.  Unexpected results may arise from  inaccuracies in the declared medications.  **Note: The testing scope of this panel includes these medications:  Baclofen  **Note: The testing scope of this panel does not include small to  moderate amounts of these reported medications:  Acetaminophen  Aspirin (Aspirin 81)  Topical Diclofenac  **Note: The testing scope of this panel does not include following  reported medications:  Azelastine  Furosemide  Lisinopril  Pantoprazole  Potassium  Pravastatin  Sucralfate ==================================================================== For clinical consultation, please call [phone]. ====================================================================    UDS interpretation: No unexpected findings.          Medication Assessment Form: Not applicable. No opioids. Treatment compliance: Not applicable Risk Assessment Profile: Aberrant behavior: See initial evaluations. None observed or detected today Comorbid factors increasing risk of overdose: See initial evaluation. No additional risks detected today Opioid risk tool (ORT):  Opioid Risk  12/04/2018  Alcohol 0  Illegal Drugs 2  Rx Drugs 0  Alcohol 0  Illegal Drugs 0  Rx Drugs 0  Age between 16-45 years  0  History of Preadolescent Sexual Abuse 0  Psychological Disease 0  Depression 0  Opioid Risk Tool Scoring 2  Opioid Risk Interpretation Low Risk    ORT Scoring interpretation table:  Score <3 = Low Risk for SUD  Score between 4-7 = Moderate Risk for SUD  Score >8 = High Risk for Opioid Abuse   Risk of substance use disorder (SUD): Low  Risk Mitigation Strategies:  Patient opioid safety counseling: No controlled substances prescribed. Patient-Prescriber Agreement (PPA): No agreement signed.  Controlled substance notification to other providers: None required. No opioid therapy.  Pharmacologic Plan: No opioid  analgesic prescribed.             Laboratory Chemistry  Inflammation Markers (CRP: Acute Phase) (ESR: Chronic Phase) Lab Results  Component Value Date   CRP 11 (H) 11/05/2018   ESRSEDRATE 30 11/05/2018                         Rheumatology Markers No results found.  Renal Function Markers Lab Results  Component Value Date   BUN 17 10/03/2018   CREATININE 0.73 73/71/0626   BCR NOT APPLICABLE 94/85/4627   GFRAA 95 10/03/2018   GFRNONAA 82 10/03/2018                             Hepatic Function Markers Lab Results  Component Value Date  AST 12 10/03/2018   ALT 8 10/03/2018   ALBUMIN 3.9 01/16/2017   ALKPHOS 73 01/16/2017   HCVAB NEGATIVE 02/16/2016                        Electrolytes Lab Results  Component Value Date   NA 142 10/03/2018   K 4.5 10/03/2018   CL 104 10/03/2018   CALCIUM 9.1 10/03/2018   MG 2.1 11/05/2018                        Neuropathy Markers Lab Results  Component Value Date   VITAMINB12 468 11/05/2018   HGBA1C 5.9 (H) 10/03/2018                        CNS Tests No results found.  Bone Pathology Markers Lab Results  Component Value Date   25OHVITD1 13 (L) 11/05/2018   25OHVITD2 <1.0 11/05/2018   25OHVITD3 13 11/05/2018                         Coagulation Parameters Lab Results  Component Value Date   PLT 340 12/13/2017                        Cardiovascular Markers Lab Results  Component Value Date   BNP 17.0 11/19/2015   CKTOTAL 168 11/05/2011   CKMB 2.2 11/05/2011   TROPONINI <0.03 12/13/2017   HGB 12.0 12/13/2017   HCT 36.8 12/13/2017                         CA Markers No results found.  Endocrine Markers No results found.  Note: Lab results reviewed.  Recent Diagnostic Imaging Review  Shoulder Imaging: Shoulder-R MR wo contrast:  Results for orders placed during the hospital encounter of 08/26/15  MR Shoulder Right Wo Contrast   Narrative CLINICAL DATA:  Constant pain from the shoulder to the  elbow. Decreased range of motion.  EXAM: MRI OF THE RIGHT SHOULDER WITHOUT CONTRAST  TECHNIQUE: Multiplanar, multisequence MR imaging of the shoulder was performed. No intravenous contrast was administered.  COMPARISON:  08/21/2013  FINDINGS: Rotator cuff: Large full-thickness, near complete, tear of the supraspinatus tendon with 3 cm of retraction. Mild tendinosis of the infraspinatus tendon. Teres minor tendon is intact. Subscapularis tendon is intact.  Muscles: Mild muscle atrophy of the supraspinatus and infraspinatus muscles.  Biceps long head: Severe tendinosis of the intraarticular portion of the long head of the biceps tendon with a partial thickness tear.  Acromioclavicular Joint: Severe degenerative changes of the acromioclavicular joint. Type I acromion.  Glenohumeral Joint: Small joint effusion. No chondral defect. No dislocation.  Labrum: Limited evaluation secondary to lack of intra-articular contrast. Increased signal within the superior labrum consistent with degeneration.  Bones:  No marrow signal abnormality.  No fracture or dislocation.  Soft tissue: Mild edema in the superolateral deltoid muscle adjacent to the acromial insertion. Cystic structure at the musculotendinous junction of the posterior deltoid muscle likely representing a ganglion cyst or interstitial tear.  IMPRESSION: 1. Large full-thickness, near complete, tear of the supraspinatus tendon with 3 cm of retraction. 2. Mild tendinosis of the infraspinatus tendon. 3. Severe tendinosis of the intraarticular portion of the long head of the biceps tendon with a partial thickness tear. 4. Mild supraspinatus and infraspinatus muscle atrophy. 5. Mild deltoid muscle  strain laterally near the acromial insertion.   Electronically Signed   By: Kathreen Devoid   On: 08/26/2015 10:19    Shoulder-L MR wo contrast:  Results for orders placed during the hospital encounter of 12/22/12  MR Shoulder  Left Wo Contrast   Narrative *RADIOLOGY REPORT*  Clinical Data:  Left shoulder pain for 3 months.  MRI OF THE LEFT SHOULDER WITHOUT CONTRAST  Technique:  Multiplanar, multisequence MR imaging of the left shoulder was performed.  No intravenous contrast was administered.  Comparison:  None  FINDINGS: Rotator cuff:  The supraspinatus and infraspinatus tendons are completely torn and retracted 4-5 cm, to the level of the glenoid. There is a large ossific fragment in the distal infraspinatus measuring 1.7 cm cranial-caudal by 0.6 cm AP by 2.0 cm transverse. The subscapularis is severely tendinopathic with longitudinal split tearing of its superior fibers. The teres minor is intact. Muscles:  Marked fatty replacement is seen in both the supraspinatus and infraspinatus.  The subscapularis and teres minor are mildly atrophic. Biceps long head:  Marked intrasubstance increased T2 signal in the biceps tendon as it passes in the bicipital interval is consistent with severe tendinosis and partial tearing.  There appear to be some intact fibers present.  Acromioclavicular Joint:  Bulky acromioclavicular degenerative change is present with marrow edema and subchondral cyst formation about the joint. Glenohumeral Joint:  The humeral head is high-riding from the patient's chronic supraspinatus and infraspinatus tears.  Mild to moderate glenohumeral degenerative change is present.  Labrum:  The superior labrum is severely degenerated and torn.  The remainder of the labrum appears intact. Bones:  The acromion is type 1.  There is fluid in the soft/subdeltoid bursa.  IMPRESSION:  1.  Complete supraspinatus and infraspinatus tendon tears with marked retraction and atrophy. 2.  Severe subscapularis tendinopathy with longitudinal split tearing of its superior fibers. 3.  Severe tendinosis of the long head of biceps.  Intact fibers appear to be present but there is marked interstitial tearing  of the tendon as it passes through the bicipital interval. 4.  Severe acromioclavicular osteoarthritis. 5.  Glenohumeral degenerative change with associated tearing of the superior labrum. 6.  Subacromial/subdeltoid bursitis.   Original Report Authenticated By: Orlean Patten, M.D.    Lumbosacral Imaging: Lumbar DG (Complete) 4+V:  Results for orders placed during the hospital encounter of 10/03/18  DG Lumbar Spine Complete   Narrative CLINICAL DATA:  Chronic low back pain.  EXAM: LUMBAR SPINE - COMPLETE 4+ VIEW  COMPARISON:  Chest x-ray 12/13/2017.  FINDINGS: Surgical clips right upper quadrant. Lumbar spine numbered the lowest segmented appearing lumbar shaped vertebrae on lateral view as L5. 6 mm anterolisthesis L4 on L5. Diffuse degenerative change lumbar spine and both hips. No acute bony abnormality identified. Pelvic calcifications consistent phleboliths.  IMPRESSION: Diffuse multilevel degenerative change lumbar spine with 6 mm anterolisthesis L4 on L5.   Electronically Signed   By: Marcello Moores  Register   On: 10/03/2018 09:33           Complexity Note: Imaging results reviewed. Results shared with Ms. Swailes, using Layman's terms.                         Meds   Current Outpatient Medications:  .  acetaminophen (TYLENOL) 500 MG tablet, Take 500 mg by mouth every 6 (six) hours as needed., Disp: , Rfl:  .  aspirin EC 81 MG tablet, Take 81 mg by mouth daily., Disp: ,  Rfl:  .  azelastine (OPTIVAR) 0.05 % ophthalmic solution, INT 1 GTT INTO OU BID FOR 10 DAYS, Disp: , Rfl:  .  baclofen (LIORESAL) 10 MG tablet, Take 1 tablet (10 mg total) by mouth 3 (three) times daily as needed for muscle spasms., Disp: 30 each, Rfl: 2 .  Blood Pressure Monitoring (B-D ASSURE BPM/AUTO WRIST CUFF) MISC, 1 Device by Does not apply route daily., Disp: 1 each, Rfl: 0 .  diclofenac sodium (VOLTAREN) 1 % GEL, Apply 2 g topically 4 (four) times daily as needed (moderate pain of back and  knees)., Disp: 100 g, Rfl: 5 .  furosemide (LASIX) 20 MG tablet, Take 1 tablet (20 mg total) by mouth daily., Disp: 90 tablet, Rfl: 2 .  lisinopril (PRINIVIL,ZESTRIL) 20 MG tablet, TAKE 1 TABLET(20 MG) BY MOUTH DAILY, Disp: 90 tablet, Rfl: 0 .  pantoprazole (PROTONIX) 40 MG tablet, Take 1 tablet (40 mg total) by mouth daily., Disp: 30 tablet, Rfl: 5 .  calcium carbonate (CALCIUM 600) 600 MG TABS tablet, Take 1 tablet (600 mg total) by mouth 2 (two) times daily with a meal for 30 days., Disp: 60 tablet, Rfl: 0 .  Cholecalciferol (VITAMIN D3) 125 MCG (5000 UT) CAPS, Take 1 capsule (5,000 Units total) by mouth daily with breakfast. Take along with calcium and magnesium., Disp: 30 capsule, Rfl: 5 .  [START ON 12/05/2018] ergocalciferol (VITAMIN D2) 1.25 MG (50000 UT) capsule, Take 1 capsule (50,000 Units total) by mouth 2 (two) times a week. X 6 weeks., Disp: 12 capsule, Rfl: 0 .  furosemide (LASIX) 20 MG tablet, Take 1 tablet (20 mg total) by mouth daily for 7 days., Disp: 7 tablet, Rfl: 0 .  Magnesium 500 MG CAPS, Take 1 capsule (500 mg total) by mouth 2 (two) times daily at 8 am and 10 pm., Disp: 60 capsule, Rfl: 5  ROS  Constitutional: Denies any fever or chills Gastrointestinal: No reported hemesis, hematochezia, vomiting, or acute GI distress Musculoskeletal: Denies any acute onset joint swelling, redness, loss of ROM, or weakness Neurological: No reported episodes of acute onset apraxia, aphasia, dysarthria, agnosia, amnesia, paralysis, loss of coordination, or loss of consciousness  Allergies  Ms. Akerley is allergic to baclofen; meclizine; other; penicillins; prednisone; prevacid [lansoprazole]; gabapentin; and oxycodone.  PFSH  Drug: Ms. Bold  reports no history of drug use. Alcohol:  reports no history of alcohol use. Tobacco:  reports that she has never smoked. She has never used smokeless tobacco. Medical:  has a past medical history of Anxiety, Asthma, Bronchitis, GERD  (gastroesophageal reflux disease), Hypertension, Morbid obesity with BMI of 40.0-44.9, adult ([place]) (04/08/2012), OA (osteoarthritis), Osteoporosis, Prediabetes, Sinus infection, and Venous (peripheral) insufficiency (08/19/2010). Surgical: Ms. Gaccione  has a past surgical history that includes Abdominal hysterectomy; Cholecystectomy; Replacement total knee; Heel spur surgery; Shoulder surgery (Left); Shoulder arthroscopy with open rotator cuff repair (Right, 09/15/2015); Replacement total knee (Right); Carpal tunnel release (Right); and Colonoscopy with propofol (N/A, 06/19/2017). Family: family history includes Healthy in her sister; Heart disease in her father; Hypertension in her mother, sister, sister, and sister; Subarachnoid hemorrhage in her mother and sister. She was adopted.  Constitutional Exam  General appearance: Well nourished, well developed, and well hydrated. In no apparent acute distress Vitals:   12/04/18 1149 12/04/18 1326  BP: 134/80 (!) 155/72  Pulse: 71   Resp:  20  Temp: 98 F (36.7 C) 98 F (36.7 C)  SpO2: 98% 96%  Weight: 281 lb (127.5 kg)   Height: 5'  7" (1.702 m)    BMI Assessment: Estimated body mass index is 44.01 kg/m as calculated from the following:   Height as of this encounter: '5\' 7"'$  (1.702 m).   Weight as of this encounter: 281 lb (127.5 kg).  BMI interpretation table: BMI level Category Range association with higher incidence of chronic pain  <18 kg/m2 Underweight   18.5-24.9 kg/m2 Ideal body weight   25-29.9 kg/m2 Overweight Increased incidence by 20%  30-34.9 kg/m2 Obese (Class I) Increased incidence by 68%  35-39.9 kg/m2 Severe obesity (Class II) Increased incidence by 136%  >40 kg/m2 Extreme obesity (Class III) Increased incidence by 254%   Patient's current BMI Ideal Body weight  Body mass index is 44.01 kg/m. Ideal body weight: 61.6 kg (135 lb 12.9 oz) Adjusted ideal body weight: 87.9 kg (193 lb 14.1 oz)   BMI Readings from Last 4 Encounters:   12/04/18 44.01 kg/m  11/05/18 45.11 kg/m  10/03/18 45.58 kg/m  07/16/18 44.64 kg/m   Wt Readings from Last 4 Encounters:  12/04/18 281 lb (127.5 kg)  11/05/18 288 lb (130.6 kg)  10/03/18 291 lb (132 kg)  07/16/18 285 lb (129.3 kg)  Psych/Mental status: Alert, oriented x 3 (person, place, & time)       Eyes: PERLA Respiratory: No evidence of acute respiratory distress  Cervical Spine Area Exam  Skin & Axial Inspection: No masses, redness, edema, swelling, or associated skin lesions Alignment: Symmetrical Functional ROM: Unrestricted ROM      Stability: No instability detected Muscle Tone/Strength: Functionally intact. No obvious neuro-muscular anomalies detected. Sensory (Neurological): Unimpaired Palpation: No palpable anomalies              Upper Extremity (UE) Exam    Side: Right upper extremity  Side: Left upper extremity  Skin & Extremity Inspection: Skin color, temperature, and hair growth are WNL. No peripheral edema or cyanosis. No masses, redness, swelling, asymmetry, or associated skin lesions. No contractures.  Skin & Extremity Inspection: Skin color, temperature, and hair growth are WNL. No peripheral edema or cyanosis. No masses, redness, swelling, asymmetry, or associated skin lesions. No contractures.  Functional ROM: Unrestricted ROM          Functional ROM: Unrestricted ROM          Muscle Tone/Strength: Functionally intact. No obvious neuro-muscular anomalies detected.  Muscle Tone/Strength: Functionally intact. No obvious neuro-muscular anomalies detected.  Sensory (Neurological): Unimpaired          Sensory (Neurological): Unimpaired          Palpation: No palpable anomalies              Palpation: No palpable anomalies              Provocative Test(s):  Phalen's test: deferred Tinel's test: deferred Apley's scratch test (touch opposite shoulder):  Action 1 (Across chest): deferred Action 2 (Overhead): deferred Action 3 (LB reach): deferred   Provocative  Test(s):  Phalen's test: deferred Tinel's test: deferred Apley's scratch test (touch opposite shoulder):  Action 1 (Across chest): deferred Action 2 (Overhead): deferred Action 3 (LB reach): deferred    Thoracic Spine Area Exam  Skin & Axial Inspection: No masses, redness, or swelling Alignment: Symmetrical Functional ROM: Unrestricted ROM Stability: No instability detected Muscle Tone/Strength: Functionally intact. No obvious neuro-muscular anomalies detected. Sensory (Neurological): Unimpaired Muscle strength & Tone: No palpable anomalies  Lumbar Spine Area Exam  Skin & Axial Inspection: No masses, redness, or swelling Alignment: Symmetrical Functional ROM: Decreased ROM affecting both  sides Stability: No instability detected Muscle Tone/Strength: Guarding detected Sensory (Neurological): Movement-associated pain Palpation: Complains of area being tender to palpation       Provocative Tests: Hyperextension/rotation test: (+) bilaterally for facet joint pain. Lumbar quadrant test (Kemp's test): (+) bilaterally for facet joint pain. Lateral bending test: (-)       Patrick's Maneuver: (+) for bilateral S-I arthralgia             FABER* test: (+) for bilateral S-I arthralgia              *(Flexion, ABduction and External Rotation)  Gait & Posture Assessment  Ambulation: Patient ambulates using a cane Gait: Limited. Using assistive device to ambulate Posture: Antalgic   Lower Extremity Exam    Side: Right lower extremity  Side: Left lower extremity  Stability: No instability observed          Stability: No instability observed          Skin & Extremity Inspection: Skin color, temperature, and hair growth are WNL. No peripheral edema or cyanosis. No masses, redness, swelling, asymmetry, or associated skin lesions. No contractures.  Skin & Extremity Inspection: Skin color, temperature, and hair growth are WNL. No peripheral edema or cyanosis. No masses, redness, swelling,  asymmetry, or associated skin lesions. No contractures.  Functional ROM: Decreased ROM for hip and knee joints          Functional ROM: Decreased ROM for hip and knee joints          Muscle Tone/Strength: Able to Toe-walk & Heel-walk without problems  Muscle Tone/Strength: Able to Toe-walk & Heel-walk without problems  Sensory (Neurological): Referred pain pattern        Sensory (Neurological): Referred pain pattern        DTR: Patellar: deferred today Achilles: deferred today Plantar: deferred today  DTR: Patellar: deferred today Achilles: deferred today Plantar: deferred today  Palpation: No palpable anomalies  Palpation: No palpable anomalies   Assessment & Plan  Primary Diagnosis & Pertinent Problem List: The primary encounter diagnosis was Chronic pain syndrome. 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  Visit Diagnosis: 1. Chronic pain syndrome   2. Chronic low back pain (Primary area of Pain) (Bilateral) (R>L) w/ sciatica (Right)   3. Chronic sacroiliac joint pain (Bilateral) (R>L)   4. Lumbar facet syndrome (Bilateral) (R>L)   5. Lumbar (6 mm) Anterolisthesis of L4/L5   6. Chronic lower extremity pain (Secondary Area of Pain) (Right)   7. Chronic knee pain (Tertiary Area of Pain) (Bilateral) (R>L)   8. 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 History of knee replacement, total (Left)   10. History of total knee replacement (Right)   11. Vitamin D  deficiency    Problems updated and reviewed during this visit: Problem  Chronic sacroiliac joint pain (Bilateral) (R>L)  Lumbar facet syndrome (Bilateral) (R>L)  Lumbar (6 mm) Anterolisthesis of L4/L5    Plan of Care  Pharmacotherapy (Medications Ordered): Meds ordered this encounter  Medications  . ergocalciferol (VITAMIN D2) 1.25 MG (  50000 UT) capsule    Sig: Take 1 capsule (50,000 Units total) by mouth 2 (two) times a week. X 6 weeks.    Dispense:  12 capsule    Refill:  0    Do not add this medication to the electronic "Automatic Refill" notification system. Patient may have prescription filled one day early if pharmacy is closed on scheduled refill date.  . Cholecalciferol (VITAMIN D3) 125 MCG (5000 UT) CAPS    Sig: Take 1 capsule (5,000 Units total) by mouth daily with breakfast. Take along with calcium and magnesium.    Dispense:  30 capsule    Refill:  5    Do not place medication on "Automatic Refill".  May substitute with similar over-the-counter product.  . Magnesium 500 MG CAPS    Sig: Take 1 capsule (500 mg total) by mouth 2 (two) times daily at 8 am and 10 pm.    Dispense:  60 capsule    Refill:  5    Do not place medication on "Automatic Refill".  The patient may use similar over-the-counter product.  . calcium carbonate (CALCIUM 600) 600 MG TABS tablet    Sig: Take 1 tablet (600 mg total) by mouth 2 (two) times daily with a meal for 30 days.    Dispense:  60 tablet    Refill:  0    Do not place medication on "Automatic Refill". Fill one day early if pharmacy is closed on scheduled refill date.    Procedure Orders     LUMBAR FACET(MEDIAL BRANCH NERVE BLOCK) MBNB     LUMBAR FACET(MEDIAL BRANCH NERVE BLOCK) MBNB Lab Orders  No laboratory test(s) ordered today    Imaging Orders     DG Lumbar Spine Complete W/Bend     DG Si Joints Referral Orders  No referral(s) requested today   Pharmacological management options:  Opioid Analgesics: I will not be  prescribing any opioids at this time Membrane stabilizer: We have discussed the possibility of optimizing this mode of therapy, if tolerated Muscle relaxant: We have discussed the possibility of a trial NSAID: We have discussed the possibility of a trial Other analgesic(s): To be determined at a later time   Interventional management options: Planned, scheduled, and/or pending:    Diagnostic bilateral lumbar facet block #1 under fluoroscopic guidance and IV sedation   Considering:   Diagnostic bilateral lumbar facet block  Possible bilateral lumbar facet RFA  Diagnostic bilateral sacroiliac joint blocks  Possible bilateral sacroiliac joint RFA  Diagnostic right-sided L4 transforaminal LESI  Diagnostic right-sided L3-4 LESI  Diagnostic bilateral genicular nerve blocks  Possible bilateral genicular nerve RFA    PRN Procedures:   None at this time   Provider-requested follow-up: Return for Procedure (w/ sedation): (B) L-FCT BLK #1.  Future Appointments  Date Time Provider [place]  05/06/2019  8:30 AM Riddle ADVISOR [facility] None    Primary Care Physician: Mikey College, NP Location: [facility] Note by: Gaspar Cola, MD Date: 12/04/2018; Time: 4:19 PM

## 2018-12-03 DIAGNOSIS — G8928 Other chronic postprocedural pain: Secondary | ICD-10-CM | POA: Insufficient documentation

## 2018-12-03 DIAGNOSIS — G8929 Other chronic pain: Secondary | ICD-10-CM | POA: Insufficient documentation

## 2018-12-03 DIAGNOSIS — Z96653 Presence of artificial knee joint, bilateral: Secondary | ICD-10-CM

## 2018-12-03 DIAGNOSIS — M25562 Pain in left knee: Secondary | ICD-10-CM

## 2018-12-03 DIAGNOSIS — Z96652 Presence of left artificial knee joint: Secondary | ICD-10-CM | POA: Insufficient documentation

## 2018-12-03 DIAGNOSIS — M25561 Pain in right knee: Secondary | ICD-10-CM

## 2018-12-03 DIAGNOSIS — E559 Vitamin D deficiency, unspecified: Secondary | ICD-10-CM | POA: Insufficient documentation

## 2018-12-04 ENCOUNTER — Encounter: Payer: Self-pay | Admitting: Pain Medicine

## 2018-12-04 ENCOUNTER — Ambulatory Visit
Admission: RE | Admit: 2018-12-04 | Discharge: 2018-12-04 | Disposition: A | Discharge status: Home / Self Care | Payer: Medicare HMO | Source: Ambulatory Visit | Attending: Pain Medicine | Admitting: Pain Medicine

## 2018-12-04 ENCOUNTER — Other Ambulatory Visit: Payer: Self-pay | Admitting: Pain Medicine

## 2018-12-04 ENCOUNTER — Ambulatory Visit: Payer: Medicare HMO | Admitting: Pain Medicine

## 2018-12-04 ENCOUNTER — Other Ambulatory Visit: Payer: Self-pay

## 2018-12-04 VITALS — BP 155/72 | HR 71 | Temp 98.0°F | Resp 20 | Ht 67.0 in | Wt 281.0 lb

## 2018-12-04 DIAGNOSIS — G894 Chronic pain syndrome: Secondary | ICD-10-CM

## 2018-12-04 DIAGNOSIS — Z96651 Presence of right artificial knee joint: Secondary | ICD-10-CM

## 2018-12-04 DIAGNOSIS — M533 Sacrococcygeal disorders, not elsewhere classified: Secondary | ICD-10-CM

## 2018-12-04 DIAGNOSIS — M47816 Spondylosis without myelopathy or radiculopathy, lumbar region: Secondary | ICD-10-CM

## 2018-12-04 DIAGNOSIS — M5441 Lumbago with sciatica, right side: Principal | ICD-10-CM

## 2018-12-04 DIAGNOSIS — M25562 Pain in left knee: Secondary | ICD-10-CM

## 2018-12-04 DIAGNOSIS — G8929 Other chronic pain: Secondary | ICD-10-CM | POA: Insufficient documentation

## 2018-12-04 DIAGNOSIS — M25561 Pain in right knee: Secondary | ICD-10-CM

## 2018-12-04 DIAGNOSIS — Z96652 Presence of left artificial knee joint: Secondary | ICD-10-CM

## 2018-12-04 DIAGNOSIS — M4310 Spondylolisthesis, site unspecified: Secondary | ICD-10-CM

## 2018-12-04 DIAGNOSIS — Z96653 Presence of artificial knee joint, bilateral: Secondary | ICD-10-CM

## 2018-12-04 DIAGNOSIS — M545 Low back pain: Secondary | ICD-10-CM | POA: Diagnosis not present

## 2018-12-04 DIAGNOSIS — E559 Vitamin D deficiency, unspecified: Secondary | ICD-10-CM | POA: Insufficient documentation

## 2018-12-04 DIAGNOSIS — M79604 Pain in right leg: Secondary | ICD-10-CM | POA: Diagnosis not present

## 2018-12-04 DIAGNOSIS — G8928 Other chronic postprocedural pain: Secondary | ICD-10-CM

## 2018-12-04 MED ORDER — CALCIUM CARBONATE 600 MG PO TABS: 600.0000 mg | ORAL_TABLET | Freq: Two times a day (BID) | ORAL | 0 refills | Status: DC

## 2018-12-04 MED ORDER — VITAMIN D3 125 MCG (5000 UT) PO CAPS: 1.0000 | ORAL_CAPSULE | Freq: Every day | ORAL | 5 refills | Status: AC

## 2018-12-04 MED ORDER — ERGOCALCIFEROL 1.25 MG (50000 UT) PO CAPS: 50000.0000 [IU] | ORAL_CAPSULE | ORAL | 0 refills | Status: AC

## 2018-12-04 MED ORDER — MAGNESIUM 500 MG PO CAPS: 500.0000 mg | ORAL_CAPSULE | Freq: Two times a day (BID) | ORAL | 5 refills | Status: AC

## 2018-12-04 NOTE — Patient Instructions (Addendum)
____________________________________________________________________________________________  Preparing for Procedure with Sedation  Procedure appointments are limited to planned procedures: . No Prescription Refills. . No disability issues will be discussed. . No medication changes will be discussed.  Instructions: . Oral Intake: Do not eat or drink anything for at least 8 hours prior to your procedure. . Transportation: Public transportation is not allowed. Bring an adult driver. The driver must be physically present in our waiting room before any procedure can be started. . Physical Assistance: Bring an adult physically capable of assisting you, in the event you need help. This adult should keep you company at home for at least 6 hours after the procedure. . Blood Pressure Medicine: Take your blood pressure medicine with a sip of water the morning of the procedure. . Blood thinners: Notify our staff if you are taking any blood thinners. Depending on which one you take, there will be specific instructions on how and when to stop it. . Diabetics on insulin: Notify the staff so that you can be scheduled 1st case in the morning. If your diabetes requires high dose insulin, take only  of your normal insulin dose the morning of the procedure and notify the staff that you have done so. . Preventing infections: Shower with an antibacterial soap the morning of your procedure. . Build-up your immune system: Take 1000 mg of Vitamin C with every meal (3 times a day) the day prior to your procedure. . Antibiotics: Inform the staff if you have a condition or reason that requires you to take antibiotics before dental procedures. . Pregnancy: If you are pregnant, call and cancel the procedure. . Sickness: If you have a cold, fever, or any active infections, call and cancel the procedure. . Arrival: You must be in the facility at least 30 minutes prior to your scheduled procedure. . Children: Do not bring  children with you. . Dress appropriately: Bring dark clothing that you would not mind if they get stained. . Valuables: Do not bring any jewelry or valuables.  Reasons to call and reschedule or cancel your procedure: (Following these recommendations will minimize the risk of a serious complication.) . Surgeries: Avoid having procedures within 2 weeks of any surgery. (Avoid for 2 weeks before or after any surgery). . Flu Shots: Avoid having procedures within 2 weeks of a flu shots or . (Avoid for 2 weeks before or after immunizations). . Barium: Avoid having a procedure within 7-10 days after having had a radiological study involving the use of radiological contrast. (Myelograms, Barium swallow or enema study). . Heart attacks: Avoid any elective procedures or surgeries for the initial 6 months after a "Myocardial Infarction" (Heart Attack). . Blood thinners: It is imperative that you stop these medications before procedures. Let us know if you if you take any blood thinner.  . Infection: Avoid procedures during or within two weeks of an infection (including chest colds or gastrointestinal problems). Symptoms associated with infections include: Localized redness, fever, chills, night sweats or profuse sweating, burning sensation when voiding, cough, congestion, stuffiness, runny nose, sore throat, diarrhea, nausea, vomiting, cold or Flu symptoms, recent or current infections. It is specially important if the infection is over the area that we intend to treat. . Heart and lung problems: Symptoms that may suggest an active cardiopulmonary problem include: cough, chest pain, breathing difficulties or shortness of breath, dizziness, ankle swelling, uncontrolled high or unusually low blood pressure, and/or palpitations. If you are experiencing any of these symptoms, cancel your procedure and contact   your primary care physician for an evaluation.  Remember:  Regular Business hours are:  Monday to Thursday  8:00 AM to 4:00 PM  Provider's Schedule: Shanen Norris, MD:  Procedure days: Tuesday and Thursday 7:30 AM to 4:00 PM  Bilal Lateef, MD:  Procedure days: Monday and Wednesday 7:30 AM to 4:00 PM ____________________________________________________________________________________________   ____________________________________________________________________________________________  General Risks and Possible Complications  Patient Responsibilities: It is important that you read this as it is part of your informed consent. It is our duty to inform you of the risks and possible complications associated with treatments offered to you. It is your responsibility as a patient to read this and to ask questions about anything that is not clear or that you believe was not covered in this document.  Patient's Rights: You have the right to refuse treatment. You also have the right to change your mind, even after initially having agreed to have the treatment done. However, under this last option, if you wait until the last second to change your mind, you may be charged for the materials used up to that point.  Introduction: Medicine is not an exact science. Everything in Medicine, including the lack of treatment(s), carries the potential for danger, harm, or loss (which is by definition: Risk). In Medicine, a complication is a secondary problem, condition, or disease that can aggravate an already existing one. All treatments carry the risk of possible complications. The fact that a side effects or complications occurs, does not imply that the treatment was conducted incorrectly. It must be clearly understood that these can happen even when everything is done following the highest safety standards.  No treatment: You can choose not to proceed with the proposed treatment alternative. The "PRO(s)" would include: avoiding the risk of complications associated with the therapy. The "CON(s)" would include: not  getting any of the treatment benefits. These benefits fall under one of three categories: diagnostic; therapeutic; and/or palliative. Diagnostic benefits include: getting information which can ultimately lead to improvement of the disease or symptom(s). Therapeutic benefits are those associated with the successful treatment of the disease. Finally, palliative benefits are those related to the decrease of the primary symptoms, without necessarily curing the condition (example: decreasing the pain from a flare-up of a chronic condition, such as incurable terminal cancer).  General Risks and Complications: These are associated to most interventional treatments. They can occur alone, or in combination. They fall under one of the following six (6) categories: no benefit or worsening of symptoms; bleeding; infection; nerve damage; allergic reactions; and/or death. 1. No benefits or worsening of symptoms: In Medicine there are no guarantees, only probabilities. No healthcare provider can ever guarantee that a medical treatment will work, they can only state the probability that it may. Furthermore, there is always the possibility that the condition may worsen, either directly, or indirectly, as a consequence of the treatment. 2. Bleeding: This is more common if the patient is taking a blood thinner, either prescription or over the counter (example: Goody Powders, Fish oil, Aspirin, Garlic, etc.), or if suffering a condition associated with impaired coagulation (example: Hemophilia, cirrhosis of the liver, low platelet counts, etc.). However, even if you do not have one on these, it can still happen. If you have any of these conditions, or take one of these drugs, make sure to notify your treating physician. 3. Infection: This is more common in patients with a compromised immune system, either due to disease (example: diabetes, cancer, human immunodeficiency virus [HIV],   etc.), or due to medications or treatments  (example: therapies used to treat cancer and rheumatological diseases). However, even if you do not have one on these, it can still happen. If you have any of these conditions, or take one of these drugs, make sure to notify your treating physician. 4. Nerve Damage: This is more common when the treatment is an invasive one, but it can also happen with the use of medications, such as those used in the treatment of cancer. The damage can occur to small secondary nerves, or to large primary ones, such as those in the spinal cord and brain. This damage may be temporary or permanent and it may lead to impairments that can range from temporary numbness to permanent paralysis and/or brain death. 5. Allergic Reactions: Any time a substance or material comes in contact with our body, there is the possibility of an allergic reaction. These can range from a mild skin rash (contact dermatitis) to a severe systemic reaction (anaphylactic reaction), which can result in death. 6. Death: In general, any medical intervention can result in death, most of the time due to an unforeseen complication. ____________________________________________________________________________________________   

## 2018-12-05 ENCOUNTER — Telehealth: Payer: Self-pay | Admitting: Pain Medicine

## 2018-12-05 NOTE — Telephone Encounter (Signed)
French Ana called from [facility] and stated that one of the views were mislabeled on an xray order and it needs to be fixed before they can take the Xrays.

## 2018-12-05 NOTE — Telephone Encounter (Signed)
Will ask Dr. Laban Emperor about this.

## 2018-12-05 NOTE — Telephone Encounter (Signed)
Patient lvmail stating she went to pick up meds at [facility].Rushie Chestnut toold her to call our office as they cannot call

## 2018-12-06 NOTE — Telephone Encounter (Signed)
Patient states she got it taken care of.

## 2019-01-20 ENCOUNTER — Telehealth: Payer: Self-pay

## 2019-01-20 NOTE — Telephone Encounter (Signed)
Routing message from Sherron Monday CMA  Patient was requesting furosemide and lisinopril send to different county in [place] since she has death in family but could not give me a correct address or phone number for pharmacy. Advised her that her Rx send to different mail order pharmacy so she might need to find out with pharmacy to see how their billing works and call us back with correct pharmacy information. She is not sure to come back or call us back. Just for FYI    Acknowledged. Will stay tuned.  Saralyn Pilar, DO [facility] 01/20/2019, 1:02 PM

## 2019-01-31 ENCOUNTER — Other Ambulatory Visit: Payer: Self-pay | Admitting: Nurse Practitioner

## 2019-01-31 DIAGNOSIS — G8929 Other chronic pain: Secondary | ICD-10-CM

## 2019-01-31 DIAGNOSIS — M5442 Lumbago with sciatica, left side: Secondary | ICD-10-CM

## 2019-02-05 ENCOUNTER — Telehealth: Payer: Self-pay | Admitting: Nurse Practitioner

## 2019-02-05 DIAGNOSIS — M62830 Muscle spasm of back: Secondary | ICD-10-CM

## 2019-02-05 MED ORDER — TIZANIDINE HCL 2 MG PO TABS: 2.0000 mg | ORAL_TABLET | Freq: Three times a day (TID) | ORAL | 2 refills | Status: DC | PRN

## 2019-02-05 NOTE — Telephone Encounter (Signed)
Pt called said that she could not take baclofen requesting Tizanidine called into [facility]  [place]

## 2019-02-11 ENCOUNTER — Ambulatory Visit (INDEPENDENT_AMBULATORY_CARE_PROVIDER_SITE_OTHER): Payer: Medicare HMO | Admitting: Family Medicine

## 2019-02-11 ENCOUNTER — Other Ambulatory Visit: Payer: Self-pay

## 2019-02-11 ENCOUNTER — Encounter: Payer: Self-pay | Admitting: Family Medicine

## 2019-02-11 DIAGNOSIS — G8929 Other chronic pain: Secondary | ICD-10-CM

## 2019-02-11 DIAGNOSIS — R0600 Dyspnea, unspecified: Secondary | ICD-10-CM

## 2019-02-11 DIAGNOSIS — R2243 Localized swelling, mass and lump, lower limb, bilateral: Secondary | ICD-10-CM

## 2019-02-11 DIAGNOSIS — M47816 Spondylosis without myelopathy or radiculopathy, lumbar region: Secondary | ICD-10-CM

## 2019-02-11 DIAGNOSIS — R0609 Other forms of dyspnea: Secondary | ICD-10-CM | POA: Diagnosis not present

## 2019-02-11 DIAGNOSIS — M5441 Lumbago with sciatica, right side: Secondary | ICD-10-CM

## 2019-02-11 NOTE — Patient Instructions (Addendum)
Thank you for coming to the office today.  Increase Furosemide from 20 to 40mg  - try to alternate every other day and 20 the other day, if need inc up to 40 in future that is fine, let us know and we can adjust the dose  Follow-up with [facility] for future injection therapy.  Use RICE therapy: - R - Rest / relative rest with activity modification avoid overuse of joint - I - Ice packs (make sure you use a towel or sock / something to protect skin) - C - Compression with ACE wrap to apply pressure and reduce swelling allowing more support - E - Elevation - if significant swelling, lift leg above heart level (toes above your nose) to help reduce swelling, most helpful at night after day of being on your feet  Leg cramps - Try spoonful of yellow mustard to relieve leg cramps or try daily to prevent the problem  - OTC natural option is Hyland's Leg Cramps (Dissolving tablet) take as needed for muscle cramps  - Try low calorie gatorade or G2 to help restore electrolytes  Please schedule a Follow-up Appointment to: Return in about 3 months (around 05/14/2019), or if symptoms worsen or fail to improve, for back pain, edema.  If you have any other questions or concerns, please feel free to call the office or send a message through MyChart. You may also schedule an earlier appointment if necessary.  Additionally, you may be receiving a survey about your experience at our office within a few days to 1 week by e-mail or mail. We value your feedback.  Saralyn Pilar, DO [facility], [place]

## 2019-02-11 NOTE — Progress Notes (Signed)
Virtual Visit via Telephone The purpose of this virtual visit is to provide medical care while limiting exposure to the novel coronavirus (COVID19) for both patient and office staff.  Consent was obtained for phone visit:  Yes.   Answered questions that patient had about telehealth interaction:  Yes.   I discussed the limitations, risks, security and privacy concerns of performing an evaluation and management service by telephone. I also discussed with the patient that there may be a patient responsible charge related to this service. The patient expressed understanding and agreed to proceed.  Patient Location: Home Provider Location: [facility])  ---------------------------------------------------------------------- Chief Complaint  Patient presents with  . Leg Swelling    intermittent bilateral leg swelling, persistent sweating. x 1-2 mth. Pt state the symptoms have worsen over the past 3 weeks     S: Reviewed CMA documentation. I have called patient and gathered additional HPI as follows:  EPISODIC SWEATING / Dyspnea on Exertion / Leg Swelling / Chronic Low Back pain Reports that symptoms started 3-4 weeks with worsening problem with dyspnea on exertion and episodic sweating, she used to be able to tolerate walking better, now with worsening back pain and deconditioning she is worsening.  - She was anticipating to get ESI injection in low back with [facility], this was delayed due to coronavirus pandemic. - improved back pain on Tizanidine, off baclofen - About 1 year ago was referred to [facility] - Cardiology Dr Adrian Blackwater had stress test and cardiac work-up that was unremarkable. - For edema lower legs, worsening now, since changed off furosemide 40 down to 20 for reduced muscle cramps, now she is having worse swelling causing tingling or cold sensation at times in toes, wants to go back up to furosemide 40mg  Denies any chest pain, dyspnea at  rest, coughing wheezing, arm pain, chest pressure, near syncope or dizziness lightheadedness  Denies any high risk travel to areas of current concern for COVID19. Denies any known or suspected exposure to person with or possibly with COVID19.  Denies any fevers, chills, body ache, cough, sinus pain or pressure, headache, abdominal pain, diarrhea  Past Medical History:  Diagnosis Date  . Anxiety   . Asthma    reactive airway and shortness of breath w/ exertion in hot temperatures  . Bronchitis   . GERD (gastroesophageal reflux disease)   . Hypertension   . Morbid obesity with BMI of 40.0-44.9, adult (HCC) 04/08/2012  . OA (osteoarthritis)    knee  . Osteoporosis   . Prediabetes   . Sinus infection    10/2018  . Venous (peripheral) insufficiency 08/19/2010   Qualifier: Diagnosis of  By: [name], [place]     Social History   Tobacco Use  . Smoking status: Never Smoker  . Smokeless tobacco: Never Used  Substance Use Topics  . Alcohol use: No  . Drug use: No    Current Outpatient Medications:  .  acetaminophen (TYLENOL) 500 MG tablet, Take 500 mg by mouth every 6 (six) hours as needed., Disp: , Rfl:  .  aspirin EC 81 MG tablet, Take 81 mg by mouth daily., Disp: , Rfl:  .  azelastine (OPTIVAR) 0.05 % ophthalmic solution, INT 1 GTT INTO OU BID FOR 10 DAYS, Disp: , Rfl:  .  Blood Pressure Monitoring (B-D ASSURE BPM/AUTO WRIST CUFF) MISC, 1 Device by Does not apply route daily., Disp: 1 each, Rfl: 0 .  Cholecalciferol (VITAMIN D3) 125 MCG (5000 UT) CAPS, Take  1 capsule (5,000 Units total) by mouth daily with breakfast. Take along with calcium and magnesium., Disp: 30 capsule, Rfl: 5 .  diclofenac sodium (VOLTAREN) 1 % GEL, APPLY 2 GRAMS TOPICALLY FOUR TIMES DAILY AS NEEDED FOR MODERATE PAIN OF BACK AND KNEES, Disp: 100 g, Rfl: 1 .  furosemide (LASIX) 20 MG tablet, Take 1 tablet (20 mg total) by mouth daily., Disp: 90 tablet, Rfl: 2 .  Magnesium 500 MG CAPS, Take 1 capsule (500 mg  total) by mouth 2 (two) times daily at 8 am and 10 pm., Disp: 60 capsule, Rfl: 5 .  pantoprazole (PROTONIX) 40 MG tablet, Take 1 tablet (40 mg total) by mouth daily., Disp: 30 tablet, Rfl: 5 .  tiZANidine (ZANAFLEX) 2 MG tablet, Take 1 tablet (2 mg total) by mouth every 8 (eight) hours as needed for muscle spasms., Disp: 30 tablet, Rfl: 2 .  calcium carbonate (CALCIUM 600) 600 MG TABS tablet, Take 1 tablet (600 mg total) by mouth 2 (two) times daily with a meal for 30 days., Disp: 60 tablet, Rfl: 0 .  lisinopril (PRINIVIL,ZESTRIL) 20 MG tablet, TAKE 1 TABLET(20 MG) BY MOUTH DAILY (Patient not taking: Reported on 02/11/2019), Disp: 90 tablet, Rfl: 0  Dep[facility] screen [facility] 11/05/2018 04/23/2018 04/17/2017  Decreased Interest 0 0 0  Down, Depressed, Hopeless 0 0 0  PHQ - 2 Score 0 0 0  Altered sleeping 0 - 0  Tired, decreased energy 3 - 1  Change in appetite 1 - 0  Feeling bad or failure about yourself  0 - 0  Trouble concentrating 0 - 0  Moving slowly or fidgety/restless 1 - 0  Suicidal thoughts 0 - 0  PHQ-9 Score 5 - 1    No flowsheet data found.  -------------------------------------------------------------------------- O: No physical exam performed due to remote telephone encounter.  Lab results reviewed.  Reviewed pictures sent to secure office email of lower extremity edema  No results found for this or any previous visit (from the past 2160 hour(s)).  -------------------------------------------------------------------------- A&P:  Problem List Items Addressed This Visit    Chronic low back pain (Primary area of Pain) (Bilateral) (R>L) w/ sciatica (Right) - Primary (Chronic)   Lumbar facet syndrome (Bilateral) (R>L) (Chronic)    Other Visit Diagnoses    Localized swelling of both lower legs       DOE (dyspnea on exertion)         Subacute on chronic bilateral LBP with assoc sciatica, with known underlying chronic back pain OA/DJD and facet syndrome. No new  injury [facility], unable to get ESI injection recently due to coronavirus, now worsening pain. Future may warrant back surgery - Improved on Tizanidine - Associated symptoms now with worsening pain and deconditioning most likely seems to be contributing to her dyspnea on exertion and sweating episode due to worsening back pain. - Additional lower extremity edema worse with sedentary and on lower dose furosemide now, with known venous insufficiency  Plan: 1. [facility] - reschedule ESI, she will call their office to arrange this 2. Continue on Tizanidine and med management 3. For edema - INCREASE Furosemide back to 40mg  daily or 40mg  every other day and 20mg  alternating if prefer, call us when ready for dose change rx and refill with correct dosing 4. Use remedy and supplement for leg cramps as a result of diuretic, maintain hydration, electrolytes, use mustard PRN and [place] leg cramp PRN 5. May use Tylenol PRN for breakthrough, heating  pad 6. Follow-up if not improving   No orders of the defined types were placed in this encounter.   Follow-up: - Return in 3 months for back pain, edema  Patient verbalizes understanding with the above medical recommendations including the limitation of remote medical advice.  Specific follow-up and call-back criteria were given for patient to follow-up or seek medical care more urgently if needed.   - Time spent in direct consultation with patient on phone: 13 minutes   Saralyn Pilar, DO [facility] 02/11/2019, 10:36 AM

## 2019-02-24 ENCOUNTER — Other Ambulatory Visit: Payer: Self-pay | Admitting: Pain Medicine

## 2019-02-28 ENCOUNTER — Other Ambulatory Visit: Payer: Self-pay

## 2019-02-28 ENCOUNTER — Other Ambulatory Visit
Admission: RE | Admit: 2019-02-28 | Discharge: 2019-02-28 | Disposition: A | Discharge status: Home / Self Care | Payer: Medicare HMO | Source: Ambulatory Visit | Attending: Pain Medicine | Admitting: Pain Medicine

## 2019-02-28 ENCOUNTER — Telehealth: Payer: Self-pay | Admitting: Nurse Practitioner

## 2019-02-28 DIAGNOSIS — Z01812 Encounter for preprocedural laboratory examination: Secondary | ICD-10-CM | POA: Insufficient documentation

## 2019-02-28 DIAGNOSIS — Z1159 Encounter for screening for other viral diseases: Secondary | ICD-10-CM | POA: Diagnosis not present

## 2019-02-28 DIAGNOSIS — I10 Essential (primary) hypertension: Secondary | ICD-10-CM

## 2019-02-28 MED ORDER — FUROSEMIDE 40 MG PO TABS: 40.0000 mg | ORAL_TABLET | Freq: Every day | ORAL | 1 refills | Status: DC

## 2019-02-28 NOTE — Telephone Encounter (Signed)
Pt. Called requesting new prescription for furosemide  40 mg called into  [facility]

## 2019-02-28 NOTE — Telephone Encounter (Signed)
Dose was increased from 20 to 40mg  last visit.  Re order  Nobie Putnam, DO [facility] 02/28/2019, 1:19 PM

## 2019-03-01 LAB — NOVEL CORONAVIRUS, NAA (HOSP ORDER, SEND-OUT TO REF LAB; TAT 18-24 HRS): SARS-CoV-2, NAA: NOT DETECTED

## 2019-03-04 ENCOUNTER — Other Ambulatory Visit: Payer: Self-pay

## 2019-03-04 ENCOUNTER — Ambulatory Visit (HOSPITAL_BASED_OUTPATIENT_CLINIC_OR_DEPARTMENT_OTHER): Payer: Medicare HMO | Admitting: Pain Medicine

## 2019-03-04 ENCOUNTER — Ambulatory Visit
Admission: RE | Admit: 2019-03-04 | Discharge: 2019-03-04 | Disposition: A | Discharge status: Home / Self Care | Payer: Medicare HMO | Source: Ambulatory Visit | Attending: Pain Medicine | Admitting: Pain Medicine

## 2019-03-04 ENCOUNTER — Encounter: Payer: Self-pay | Admitting: Pain Medicine

## 2019-03-04 VITALS — BP 165/74 | HR 76 | Temp 98.3°F | Resp 15 | Ht 67.0 in | Wt 283.0 lb

## 2019-03-04 DIAGNOSIS — M47816 Spondylosis without myelopathy or radiculopathy, lumbar region: Secondary | ICD-10-CM | POA: Diagnosis not present

## 2019-03-04 DIAGNOSIS — M47817 Spondylosis without myelopathy or radiculopathy, lumbosacral region: Secondary | ICD-10-CM | POA: Insufficient documentation

## 2019-03-04 DIAGNOSIS — M4310 Spondylolisthesis, site unspecified: Secondary | ICD-10-CM

## 2019-03-04 DIAGNOSIS — M5137 Other intervertebral disc degeneration, lumbosacral region: Secondary | ICD-10-CM | POA: Insufficient documentation

## 2019-03-04 DIAGNOSIS — M5136 Other intervertebral disc degeneration, lumbar region: Secondary | ICD-10-CM | POA: Insufficient documentation

## 2019-03-04 MED ORDER — TRIAMCINOLONE ACETONIDE 40 MG/ML IJ SUSP
80.0000 mg | Freq: Once | INTRAMUSCULAR | Status: AC
  Administered 2019-03-04: 40 mg
  Filled 2019-03-04: qty 2

## 2019-03-04 MED ORDER — LACTATED RINGERS IV SOLN
1000.0000 mL | Freq: Once | INTRAVENOUS | Status: AC
  Administered 2019-03-04: 1000 mL via INTRAVENOUS

## 2019-03-04 MED ORDER — FENTANYL CITRATE (PF) 100 MCG/2ML IJ SOLN
25.0000 ug | INTRAMUSCULAR | Status: DC | PRN
  Administered 2019-03-04: 50 ug via INTRAVENOUS
  Filled 2019-03-04: qty 2

## 2019-03-04 MED ORDER — LIDOCAINE HCL 2 % IJ SOLN
20.0000 mL | Freq: Once | INTRAMUSCULAR | Status: AC
  Administered 2019-03-04: 400 mg
  Filled 2019-03-04: qty 20

## 2019-03-04 MED ORDER — ROPIVACAINE HCL 2 MG/ML IJ SOLN
18.0000 mL | Freq: Once | INTRAMUSCULAR | Status: AC
  Administered 2019-03-04: 10 mL via PERINEURAL
  Filled 2019-03-04: qty 20

## 2019-03-04 MED ORDER — MIDAZOLAM HCL 5 MG/5ML IJ SOLN
1.0000 mg | INTRAMUSCULAR | Status: DC | PRN
  Administered 2019-03-04: 2 mg via INTRAVENOUS
  Filled 2019-03-04: qty 5

## 2019-03-04 NOTE — Patient Instructions (Signed)

## 2019-03-04 NOTE — Progress Notes (Signed)
Patient's Name: Megan Perez  MRN: [id number]  Referring Provider: Delano MetzNaveira, Amaiah Cristiano, MD  DOB: Sep 07, 1946  PCP: Galen ManilaKennedy, Lauren Renee, NP  DOS: 03/04/2019  Note by: Oswaldo DoneFrancisco A Makylah Bossard, MD  Service setting: Ambulatory outpatient  Specialty: Interventional Pain Management  Patient type: Established  Location: [facility] (AMB) Pain Management Facility  Visit type: Interventional Procedure   Primary Reason for Visit: Interventional Pain Management Treatment. CC: Back Pain (low)  Procedure:          Anesthesia, Analgesia, Anxiolysis:  Type: Lumbar Facet, Medial Branch Block(s)          Primary Purpose: Diagnostic Region: Posterolateral Lumbosacral Spine Level: L2, L3, L4, L5, & S1 Medial Branch Level(s). Injecting these levels blocks the L3-4, L4-5, and L5-S1 lumbar facet joints. Laterality: Bilateral  Type: Moderate (Conscious) Sedation combined with Local Anesthesia Indication(s): Analgesia and Anxiety Route: Intravenous (IV) IV Access: Secured Sedation: Meaningful verbal contact was maintained at all times during the procedure  Local Anesthetic: Lidocaine 1-2%  Position: Prone   Indications: 1. Lumbar facet syndrome (Bilateral) (R>L)   2. Spondylosis without myelopathy or radiculopathy, lumbosacral region   3. Lumbar (6 mm) Anterolisthesis of L4/L5   4. DDD (degenerative disc disease), lumbosacral   5. Degenerative spondylolisthesis    Pain Score: Pre-procedure: 7 /10 Post-procedure: 0-No pain/10  Pre-op Assessment:  Megan Perez is a 73 y.o. (year old), female patient, seen today for interventional treatment. She  has a past surgical history that includes Abdominal hysterectomy; Cholecystectomy; Replacement total knee; Heel spur surgery; Shoulder surgery (Left); Shoulder arthroscopy with open rotator cuff repair (Right, 09/15/2015); Replacement total knee (Right); Carpal tunnel release (Right); and Colonoscopy with propofol (N/A, 06/19/2017). Megan Perez has a current medication list which  includes the following prescription(s): acetaminophen, aspirin ec, azelastine, b-d assure bpm/auto wrist cuff, calcium carbonate, vitamin d3, diclofenac sodium, furosemide, lisinopril, magnesium, pantoprazole, and tizanidine, and the following Facility-Administered Medications: fentanyl and midazolam. Her primarily concern today is the Back Pain (low)  Initial Vital Signs:  Pulse/HCG Rate: 76ECG Heart Rate: 75 Temp: 98.7 F (37.1 C) Resp: 18 BP: (!) 141/68 SpO2: 100 %  BMI: Estimated body mass index is 44.32 kg/m as calculated from the following:   Height as of this encounter: 5\' 7"  (1.702 m).   Weight as of this encounter: 283 lb (128.4 kg).  Risk Assessment: Allergies: Reviewed. She is allergic to baclofen; meclizine; other; penicillins; prednisone; prevacid [lansoprazole]; gabapentin; and oxycodone.  Allergy Precautions: None required Coagulopathies: Reviewed. None identified.  Blood-thinner therapy: None at this time Active Infection(s): Reviewed. None identified. Megan Perez is afebrile  Site Confirmation: Megan Perez was asked to confirm the procedure and laterality before marking the site Procedure checklist: Completed Consent: Before the procedure and under the influence of no sedative(s), amnesic(s), or anxiolytics, the patient was informed of the treatment options, risks and possible complications. To fulfill our ethical and legal obligations, as recommended by the American Medical Association's Code of Ethics, I have informed the patient of my clinical impression; the nature and purpose of the treatment or procedure; the risks, benefits, and possible complications of the intervention; the alternatives, including doing nothing; the risk(s) and benefit(s) of the alternative treatment(s) or procedure(s); and the risk(s) and benefit(s) of doing nothing. The patient was provided information about the general risks and possible complications associated with the procedure. These may  include, but are not limited to: failure to achieve desired goals, infection, bleeding, organ or nerve damage, allergic reactions, paralysis, and death. In addition, the  patient was informed of those risks and complications associated to Spine-related procedures, such as failure to decrease pain; infection (i.e.: Meningitis, epidural or intraspinal abscess); bleeding (i.e.: epidural hematoma, subarachnoid hemorrhage, or any other type of intraspinal or peri-dural bleeding); organ or nerve damage (i.e.: Any type of peripheral nerve, nerve root, or spinal cord injury) with subsequent damage to sensory, motor, and/or autonomic systems, resulting in permanent pain, numbness, and/or weakness of one or several areas of the body; allergic reactions; (i.e.: anaphylactic reaction); and/or death. Furthermore, the patient was informed of those risks and complications associated with the medications. These include, but are not limited to: allergic reactions (i.e.: anaphylactic or anaphylactoid reaction(s)); adrenal axis suppression; blood sugar elevation that in diabetics may result in ketoacidosis or comma; water retention that in patients with history of congestive heart failure may result in shortness of breath, pulmonary edema, and decompensation with resultant heart failure; weight gain; swelling or edema; medication-induced neural toxicity; particulate matter embolism and blood vessel occlusion with resultant organ, and/or nervous system infarction; and/or aseptic necrosis of one or more joints. Finally, the patient was informed that Medicine is not an exact science; therefore, there is also the possibility of unforeseen or unpredictable risks and/or possible complications that may result in a catastrophic outcome. The patient indicated having understood very clearly. We have given the patient no guarantees and we have made no promises. Enough time was given to the patient to ask questions, all of which were answered  to the patient's satisfaction. Megan Perez has indicated that she wanted to continue with the procedure. Attestation: I, the ordering provider, attest that I have discussed with the patient the benefits, risks, side-effects, alternatives, likelihood of achieving goals, and potential problems during recovery for the procedure that I have provided informed consent. Date   Time: 03/04/2019  9:11 AM  Pre-Procedure Preparation:  Monitoring: As per clinic protocol. Respiration, ETCO2, SpO2, BP, heart rate and rhythm monitor placed and checked for adequate function Safety Precautions: Patient was assessed for positional comfort and pressure points before starting the procedure. Time-out: I initiated and conducted the "Time-out" before starting the procedure, as per protocol. The patient was asked to participate by confirming the accuracy of the "Time Out" information. Verification of the correct person, site, and procedure were performed and confirmed by me, the nursing staff, and the patient. "Time-out" conducted as per Joint Commission's Universal Protocol (UP.01.01.01). Time: 1004  Description of Procedure:          Laterality: Bilateral. The procedure was performed in identical fashion on both sides. Levels:  L2, L3, L4, L5, & S1 Medial Branch Level(s) Area Prepped: Posterior Lumbosacral Region Prepping solution: DuraPrep (Iodine Povacrylex [0.7% available iodine] and Isopropyl Alcohol, 74% w/w) Safety Precautions: Aspiration looking for blood return was conducted prior to all injections. At no point did we inject any substances, as a needle was being advanced. Before injecting, the patient was told to immediately notify me if she was experiencing any new onset of "ringing in the ears, or metallic taste in the mouth". No attempts were made at seeking any paresthesias. Safe injection practices and needle disposal techniques used. Medications properly checked for expiration dates. SDV (single dose vial)  medications used. After the completion of the procedure, all disposable equipment used was discarded in the proper designated medical waste containers. Local Anesthesia: Protocol guidelines were followed. The patient was positioned over the fluoroscopy table. The area was prepped in the usual manner. The time-out was completed. The target area was  identified using fluoroscopy. A 12-in long, straight, sterile hemostat was used with fluoroscopic guidance to locate the targets for each level blocked. Once located, the skin was marked with an approved surgical skin marker. Once all sites were marked, the skin (epidermis, dermis, and hypodermis), as well as deeper tissues (fat, connective tissue and muscle) were infiltrated with a small amount of a short-acting local anesthetic, loaded on a 10cc syringe with a 25G, 1.5-in  Needle. An appropriate amount of time was allowed for local anesthetics to take effect before proceeding to the next step. Local Anesthetic: Lidocaine 2.0% The unused portion of the local anesthetic was discarded in the proper designated containers. Technical explanation of process:  L2 Medial Branch Nerve Block (MBB): The target area for the L2 medial branch is at the junction of the postero-lateral aspect of the superior articular process and the superior, posterior, and medial edge of the transverse process of L3. Under fluoroscopic guidance, a Quincke needle was inserted until contact was made with os over the superior postero-lateral aspect of the pedicular shadow (target area). After negative aspiration for blood, 0.5 mL of the nerve block solution was injected without difficulty or complication. The needle was removed intact. L3 Medial Branch Nerve Block (MBB): The target area for the L3 medial branch is at the junction of the postero-lateral aspect of the superior articular process and the superior, posterior, and medial edge of the transverse process of L4. Under fluoroscopic guidance, a  Quincke needle was inserted until contact was made with os over the superior postero-lateral aspect of the pedicular shadow (target area). After negative aspiration for blood, 0.5 mL of the nerve block solution was injected without difficulty or complication. The needle was removed intact. L4 Medial Branch Nerve Block (MBB): The target area for the L4 medial branch is at the junction of the postero-lateral aspect of the superior articular process and the superior, posterior, and medial edge of the transverse process of L5. Under fluoroscopic guidance, a Quincke needle was inserted until contact was made with os over the superior postero-lateral aspect of the pedicular shadow (target area). After negative aspiration for blood, 0.5 mL of the nerve block solution was injected without difficulty or complication. The needle was removed intact. L5 Medial Branch Nerve Block (MBB): The target area for the L5 medial branch is at the junction of the postero-lateral aspect of the superior articular process and the superior, posterior, and medial edge of the sacral ala. Under fluoroscopic guidance, a Quincke needle was inserted until contact was made with os over the superior postero-lateral aspect of the pedicular shadow (target area). After negative aspiration for blood, 0.5 mL of the nerve block solution was injected without difficulty or complication. The needle was removed intact. S1 Medial Branch Nerve Block (MBB): The target area for the S1 medial branch is at the posterior and inferior 6 o'clock position of the L5-S1 facet joint. Under fluoroscopic guidance, the Quincke needle inserted for the L5 MBB was redirected until contact was made with os over the inferior and postero aspect of the sacrum, at the 6 o' clock position under the L5-S1 facet joint (Target area). After negative aspiration for blood, 0.5 mL of the nerve block solution was injected without difficulty or complication. The needle was removed  intact.  Nerve block solution: 0.2% PF-Ropivacaine + Triamcinolone (40 mg/mL) diluted to a final concentration of 4 mg of Triamcinolone/mL of Ropivacaine The unused portion of the solution was discarded in the proper designated containers. Procedural Needles:  22-gauge, 3.5-inch, Quincke needles used for all levels.  Once the entire procedure was completed, the treated area was cleaned, making sure to leave some of the prepping solution back to take advantage of its long term bactericidal properties.   Illustration of the posterior view of the lumbar spine and the posterior neural structures. Laminae of L2 through S1 are labeled. DPRL5, dorsal primary ramus of L5; DPRS1, dorsal primary ramus of S1; DPR3, dorsal primary ramus of L3; FJ, facet (zygapophyseal) joint L3-L4; I, inferior articular process of L4; LB1, lateral branch of dorsal primary ramus of L1; IAB, inferior articular branches from L3 medial branch (supplies L4-L5 facet joint); IBP, intermediate branch plexus; MB3, medial branch of dorsal primary ramus of L3; NR3, third lumbar nerve root; S, superior articular process of L5; SAB, superior articular branches from L4 (supplies L4-5 facet joint also); TP3, transverse process of L3.  Vitals:   03/04/19 1015 03/04/19 1025 03/04/19 1035 03/04/19 1045  BP: 130/67 (!) 160/79 (!) 162/72 (!) 165/74  Pulse:      Resp: 16 16 17 15   Temp:  98.4 F (36.9 C)  98.3 F (36.8 C)  TempSrc:  Oral  Oral  SpO2: 98% 96% 96% 97%  Weight:      Height:         Start Time: 1004 hrs. End Time: 1016 hrs.  Imaging Guidance (Spinal):          Type of Imaging Technique: Fluoroscopy Guidance (Spinal) Indication(s): Assistance in needle guidance and placement for procedures requiring needle placement in or near specific anatomical locations not easily accessible without such assistance. Exposure Time: Please see nurses notes. Contrast: None used. Fluoroscopic Guidance: I was personally present during the  use of fluoroscopy. "Tunnel Vision Technique" used to obtain the best possible view of the target area. Parallax error corrected before commencing the procedure. "Direction-depth-direction" technique used to introduce the needle under continuous pulsed fluoroscopy. Once target was reached, antero-posterior, oblique, and lateral fluoroscopic projection used confirm needle placement in all planes. Images permanently stored in EMR. Interpretation: No contrast injected. I personally interpreted the imaging intraoperatively. Adequate needle placement confirmed in multiple planes. Permanent images saved into the patient's record.  Antibiotic Prophylaxis:   Anti-infectives (From admission, onward)   None     Indication(s): None identified  Post-operative Assessment:  Post-procedure Vital Signs:  Pulse/HCG Rate: 7665 Temp: 98.3 F (36.8 C) Resp: 15 BP: (!) 165/74 SpO2: 97 %  EBL: None  Complications: No immediate post-treatment complications observed by team, or reported by patient.  Note: The patient tolerated the entire procedure well. A repeat set of vitals were taken after the procedure and the patient was kept under observation following institutional policy, for this type of procedure. Post-procedural neurological assessment was performed, showing return to baseline, prior to discharge. The patient was provided with post-procedure discharge instructions, including a section on how to identify potential problems. Should any problems arise concerning this procedure, the patient was given instructions to immediately contact us, at any time, without hesitation. In any case, we plan to contact the patient by telephone for a follow-up status report regarding this interventional procedure.  Comments:  No additional relevant information.  Plan of Care  Orders:  Orders Placed This Encounter  Procedures   LUMBAR FACET(MEDIAL BRANCH NERVE BLOCK) MBNB    Scheduling Instructions:     Side:  Bilateral     Level: L3-4, L4-5, & L5-S1 Facets (L2, L3, L4, L5, & S1 Medial Branch Nerves)  Sedation: With Sedation.     Timeframe: Today    Order Specific Question:   Where will this procedure be performed?    Answer:   [facility]   DG PAIN CLINIC C-ARM 1-60 MIN NO REPORT    Intraoperative interpretation by procedural physician at [facility].    Standing Status:   Standing    Number of Occurrences:   1    Order Specific Question:   Reason for exam:    Answer:   Assistance in needle guidance and placement for procedures requiring needle placement in or near specific anatomical locations not easily accessible without such assistance.   Provider attestation of informed consent for procedure/surgical case    I, the ordering provider, attest that I have discussed with the patient the benefits, risks, side effects, alternatives, likelihood of achieving goals and potential problems during recovery for the procedure that I have provided informed consent.    Standing Status:   Standing    Number of Occurrences:   1   Informed Consent Details: Transcribe to consent form and obtain patient signature    Standing Status:   Standing    Number of Occurrences:   1    Order Specific Question:   Procedure    Answer:   Bilateral Lumbar facet block (medial branch block) under fluoroscopic guidance. (See notes for levels.)    Order Specific Question:   Surgeon    Answer:   Hisao Doo A. Laban Emperor, MD    Order Specific Question:   Indication/Reason    Answer:   Bilateral low back pain with or without lower extremity pain   Medications ordered for procedure: Meds ordered this encounter  Medications   lidocaine (XYLOCAINE) 2 % (with pres) injection 400 mg   lactated ringers infusion 1,000 mL   midazolam (VERSED) 5 MG/5ML injection 1-2 mg    Make sure Flumazenil is available in the pyxis when using this medication. If oversedation occurs, administer 0.2 mg IV over 15 sec. If  after 45 sec no response, administer 0.2 mg again over 1 min; may repeat at 1 min intervals; not to exceed 4 doses (1 mg)   fentaNYL (SUBLIMAZE) injection 25-50 mcg    Make sure Narcan is available in the pyxis when using this medication. In the event of respiratory depression (RR< 8/min): Titrate NARCAN (naloxone) in increments of 0.1 to 0.2 mg IV at 2-3 minute intervals, until desired degree of reversal.   ropivacaine (PF) 2 mg/mL (0.2%) (NAROPIN) injection 18 mL   triamcinolone acetonide (KENALOG-40) injection 80 mg   Medications administered: We administered lidocaine, lactated ringers, midazolam, fentaNYL, ropivacaine (PF) 2 mg/mL (0.2%), and triamcinolone acetonide.  See the medical record for exact dosing, route, and time of administration.  Disposition: Discharge home  Discharge Date & Time: 03/04/2019; 1052 hrs.   Follow-up plan:   Return in about 2 weeks (around 03/18/2019) for (VV), E/M (PP).     Future Appointments  Date Time Provider Department Center  03/19/2019  2:00 PM Delano Metz, MD [facility] None  05/06/2019  8:30 AM [facility] NURSE HEALTH ADVISOR [facility] None   Primary Care Physician: Galen Manila, NP Location: [facility] Note by: Oswaldo Done, MD Date: 03/04/2019; Time: 11:37 AM  Disclaimer:  Medicine is not an exact science. The only guarantee in medicine is that nothing is guaranteed. It is important to note that the decision to proceed with this intervention was based on the information collected from the  patient. The Data and conclusions were drawn from the patient's questionnaire, the interview, and the physical examination. Because the information was provided in large part by the patient, it cannot be guaranteed that it has not been purposely or unconsciously manipulated. Every effort has been made to obtain as much relevant data as possible for this evaluation. It is important to note that the conclusions that  lead to this procedure are derived in large part from the available data. Always take into account that the treatment will also be dependent on availability of resources and existing treatment guidelines, considered by other Pain Management Practitioners as being common knowledge and practice, at the time of the intervention. For Medico-Legal purposes, it is also important to point out that variation in procedural techniques and pharmacological choices are the acceptable norm. The indications, contraindications, technique, and results of the above procedure should only be interpreted and judged by a Board-Certified Interventional Pain Specialist with extensive familiarity and expertise in the same exact procedure and technique.

## 2019-03-04 NOTE — Progress Notes (Signed)
Safety precautions to be maintained throughout the outpatient stay will include: orient to surroundings, keep bed in low position, maintain call bell within reach at all times, provide assistance with transfer out of bed and ambulation.  

## 2019-03-05 ENCOUNTER — Telehealth: Payer: Self-pay | Admitting: *Deleted

## 2019-03-05 NOTE — Telephone Encounter (Signed)
Attempted to call for post procedure follow-up. Message left. 

## 2019-03-14 ENCOUNTER — Other Ambulatory Visit: Payer: Self-pay

## 2019-03-14 DIAGNOSIS — M62830 Muscle spasm of back: Secondary | ICD-10-CM

## 2019-03-15 MED ORDER — TIZANIDINE HCL 2 MG PO TABS: 2.0000 mg | ORAL_TABLET | Freq: Three times a day (TID) | ORAL | 2 refills | Status: DC | PRN

## 2019-03-18 ENCOUNTER — Telehealth: Payer: Self-pay | Admitting: *Deleted

## 2019-03-18 ENCOUNTER — Encounter: Payer: Self-pay | Admitting: Pain Medicine

## 2019-03-18 NOTE — Telephone Encounter (Signed)
Attempted to call for pre appointment assessment. Message left. 

## 2019-03-19 ENCOUNTER — Other Ambulatory Visit: Payer: Self-pay

## 2019-03-19 ENCOUNTER — Ambulatory Visit: Payer: Medicare HMO | Attending: Pain Medicine | Admitting: Pain Medicine

## 2019-03-19 DIAGNOSIS — G8929 Other chronic pain: Secondary | ICD-10-CM

## 2019-03-19 DIAGNOSIS — M5441 Lumbago with sciatica, right side: Secondary | ICD-10-CM

## 2019-03-19 DIAGNOSIS — M5137 Other intervertebral disc degeneration, lumbosacral region: Secondary | ICD-10-CM | POA: Diagnosis not present

## 2019-03-19 DIAGNOSIS — M47816 Spondylosis without myelopathy or radiculopathy, lumbar region: Secondary | ICD-10-CM | POA: Diagnosis not present

## 2019-03-19 DIAGNOSIS — M4310 Spondylolisthesis, site unspecified: Secondary | ICD-10-CM

## 2019-03-19 NOTE — Patient Instructions (Signed)

## 2019-03-19 NOTE — Progress Notes (Addendum)
Pain Management Virtual Encounter Note - Virtual Visit via Telephone Telehealth (real-time audio visits between healthcare provider and patient).   Patient's Phone No. & Preferred Pharmacy:  [phone] (home); [phone] (mobile); (Preferred) [phone] [email or web address]  [facility] [phone]#[id number] [place]- [place], [place] - [address] AT [facility] [address] [place] [id number] Phone: [phone] Fax: [phone]    Pre-screening note:  Our staff contacted Megan Perez and offered her an "in person", "face-to-face" appointment versus a telephone encounter. She indicated preferring the telephone encounter, at this time.   Reason for Virtual Visit: COVID-19*  Social distancing based on CDC and AMA recommendations.   I contacted Megan HivesMary A Perez on 03/19/2019 via telephone.      I clearly identified myself as Oswaldo DoneFrancisco A Shantal Roan, MD. I verified that I was speaking with the correct person using two identifiers (Name: Megan Perez, and date of birth: 04-Nov-1945).  Advanced Informed Consent I sought verbal advanced consent from Megan Perez for virtual visit interactions. I informed Megan Perez of possible security and privacy concerns, risks, and limitations associated with providing "not-in-person" medical evaluation and management services. I also informed Megan Perez of the availability of "in-person" appointments. Finally, I informed her that there would be a charge for the virtual visit and that she could be  personally, fully or partially, financially responsible for it. Megan Perez expressed understanding and agreed to proceed.   Historic Elements   Megan Perez is a 73 y.o. year old, female patient evaluated today after her last encounter by our practice on 03/18/2019. Megan Perez  has a past medical history of Anxiety, Asthma, Bronchitis, GERD (gastroesophageal reflux disease), Hypertension, Morbid obesity with BMI of 40.0-44.9, adult (HCC) (04/08/2012), OA  (osteoarthritis), Osteoporosis, Prediabetes, Sinus infection, and Venous (peripheral) insufficiency (08/19/2010). She also  has a past surgical history that includes Abdominal hysterectomy; Cholecystectomy; Replacement total knee; Heel spur surgery; Shoulder surgery (Left); Shoulder arthroscopy with open rotator cuff repair (Right, 09/15/2015); Replacement total knee (Right); Carpal tunnel release (Right); and Colonoscopy with propofol (N/A, 06/19/2017). Megan Perez has a current medication list which includes the following prescription(s): acetaminophen, aspirin ec, azelastine, b-d assure bpm/auto wrist cuff, vitamin d3, diclofenac sodium, furosemide, lisinopril, magnesium, pantoprazole, tizanidine, and calcium carbonate. She  reports that she has never smoked. She has never used smokeless tobacco. She reports that she does not drink alcohol or use drugs. Megan Perez is allergic to baclofen; meclizine; other; penicillins; prednisone; prevacid [lansoprazole]; gabapentin; and oxycodone.   HPI  Today, she is being contacted for a post-procedure assessment.  The patient indicates having done extremely well with this diagnostic lumbar facet block.  She indicates no side effects or complications.  She describes having been pain-free for approximately 3 days after which the pain started coming back but she still describes having approximately 80% relief of her pain.  She is also extremely happy about the fact that she has been able to recover function and range of motion.  She does admit that the pain starts bothering her if she spends a lot of time in front of the sink washing dishes.  Today I went over my interpretation of the results of the diagnostic procedure with the patient and I explained to her how we are dealing primarily with this lumbar facet syndrome and what the long-term plan for this would be.  The plan is to do a second diagnostic bilateral lumbar facet block when the pain returns.  If she again gets  similar results to the first 1, then she may be a good candidate for radiofrequency ablation.  She understood and accepted.  Today I have also reviewed and interpreted the results of her lumbar x-ray and SI joint x-rays.  Post-Procedure Evaluation  Procedure: Diagnostic bilateral lumbar facet block #1 under fluoroscopic guidance and IV sedation Pre-procedure pain level:  7/10 Post-procedure: 0/10          Sedation: Sedation provided.  Effectiveness during initial hour after procedure(Ultra-Short Term Relief): 100 %   Local anesthetic used: Long-acting (4-6 hours) Effectiveness: Defined as any analgesic benefit obtained secondary to the administration of local anesthetics. This carries significant diagnostic value as to the etiological location, or anatomical origin, of the pain. Duration of benefit is expected to coincide with the duration of the local anesthetic used.  Effectiveness during initial 4-6 hours after procedure(Short-Term Relief): 100 %   Long-term benefit: Defined as any relief past the pharmacologic duration of the local anesthetics.  Effectiveness past the initial 6 hours after procedure(Long-Term Relief): 100 %   Current benefits: Defined as benefit that persist at this time.   Analgesia:  80% relief Function: Megan Perez reports improvement in function ROM: Megan Perez reports improvement in ROM  Pharmacotherapy Assessment  Analgesic: No opioid analgesics from our practice.   Pertinent Labs   SAFETY SCREENING Profile Lab Results  Component Value Date   SARSCOV2NAA NOT DETECTED 02/28/2019   COVIDSOURCE NASOPHARYNGEAL 02/28/2019   HCVAB NEGATIVE 02/16/2016   Renal Function Lab Results  Component Value Date   BUN 17 10/03/2018   CREATININE 0.73 10/03/2018   BCR NOT APPLICABLE 10/03/2018   GFRAA 95 10/03/2018   GFRNONAA 82 10/03/2018   Hepatic Function Lab Results  Component Value Date   AST 12 10/03/2018   ALT 8 10/03/2018   ALBUMIN 3.9 01/16/2017    UDS Summary  Date Value Ref Range Status  11/05/2018 FINAL  Final    Comment:    ==================================================================== TOXASSURE COMP DRUG ANALYSIS,UR ==================================================================== Test                             Result       Flag       Units Drug Present and Declared for Prescription Verification   Acetaminophen                  PRESENT      EXPECTED Drug Absent but Declared for Prescription Verification   Baclofen                       Not Detected UNEXPECTED   Diclofenac                     Not Detected UNEXPECTED    Topical diclofenac, as indicated in the declared medication list,    is not always detected even when used as directed.   Salicylate                     Not Detected UNEXPECTED    Aspirin, as indicated in the declared medication list, is not    always detected even when used as directed. ==================================================================== Test                      Result    Flag   Units      Ref Range   Creatinine  93               mg/dL      >=16>=20 ==================================================================== Declared Medications:  The flagging and interpretation on this report are based on the  following declared medications.  Unexpected results may arise from  inaccuracies in the declared medications.  **Note: The testing scope of this panel includes these medications:  Baclofen  **Note: The testing scope of this panel does not include small to  moderate amounts of these reported medications:  Acetaminophen  Aspirin (Aspirin 81)  Topical Diclofenac  **Note: The testing scope of this panel does not include following  reported medications:  Azelastine  Furosemide  Lisinopril  Pantoprazole  Potassium  Pravastatin  Sucralfate ==================================================================== For clinical consultation, please call [phone]  [phone]. ====================================================================    Note: Above Lab results reviewed.  Recent imaging  DG PAIN CLINIC C-ARM 1-60 MIN NO REPORT Fluoro was used, but no Radiologist interpretation will be provided.  Please refer to "NOTES" tab for provider progress note.  Assessment  The primary encounter diagnosis was Lumbar facet syndrome (Bilateral) (R>L). Diagnoses of Lumbar (6 mm) Anterolisthesis of L4/L5, Chronic low back pain (Primary area of Pain) (Bilateral) (R>L) w/ sciatica (Right), and DDD (degenerative disc disease), lumbosacral were also pertinent to this visit.  Plan of Care  I am having Megan Perez maintain her aspirin EC, acetaminophen, B-D ASSURE BPM/AUTO WRIST CUFF, pantoprazole, lisinopril, azelastine, Vitamin D3, Magnesium, calcium carbonate, diclofenac sodium, furosemide, and tiZANidine.  Pharmacotherapy (Medications Ordered): No orders of the defined types were placed in this encounter.  Orders:  Orders Placed This Encounter  Procedures  . LUMBAR FACET(MEDIAL BRANCH NERVE BLOCK) MBNB    Standing Status:   Standing    Number of Occurrences:   5    Standing Expiration Date:   03/18/2020    Scheduling Instructions:     Purpose: Diagnostic     Indication: Axial low back pain. Lumbosacral Spondylosis (M47.897).      Side: Bilateral     Level: L3-4, L4-5, & L5-S1 Facets (L2, L3, L4, L5, & S1 Medial Branch Nerves)     Sedation: With Sedation.     TIMEFRAME: PRN procedure. (Megan Perez will call when needed.)    Order Specific Question:   Where will this procedure be performed?    Answer:   [facility]   Follow-up plan:   Return for PRN Procedure: (B) L-FCT BLK #2.  At this point the patient is doing well and does not feel that she needs any more injections for the time being.  However, she knows to call when the pain starts going back so that we can bring her back for her second diagnostic lumbar facet block under fluoroscopic  guidance and IV sedation.    Considering:   Diagnostic bilateral lumbar facet block #2  Possible bilateral lumbar facet RFA  Diagnostic bilateral sacroiliac joint blocks  Possible bilateral sacroiliac joint RFA  Diagnostic right-sided L4 transforaminal LESI  Diagnostic right-sided L3-4 LESI  Diagnostic bilateral genicular nerve blocks  Possible bilateral genicular nerve RFA    PRN Procedures:   Diagnostic bilateral lumbar facet block #2     Recent Visits Date Type Provider Dept  03/04/19 Procedure visit Delano MetzNaveira, Jaxen Samples, MD [facility]  Showing recent visits within past 90 days and meeting all other requirements   Today's Visits Date Type Provider Dept  03/19/19 Office Visit Delano MetzNaveira, Prestyn Mahn, MD [facility]  Showing today's visits and meeting all other requirements  Future Appointments No visits were found meeting these conditions.  Showing future appointments within next 90 days and meeting all other requirements   I discussed the assessment and treatment plan with the patient. The patient was provided an opportunity to ask questions and all were answered. The patient agreed with the plan and demonstrated an understanding of the instructions.  Patient advised to call back or seek an in-person evaluation if the symptoms or condition worsens.  Total duration of non-face-to-face encounter: 22 minutes.  Note by: Gaspar Cola, MD Date: 03/19/2019; Time: 2:49 PM  Note: This dictation was prepared with Dragon dictation. Any transcriptional errors that may result from this process are unintentional.  Disclaimer:  * Given the special circumstances of the COVID-19 pandemic, the federal government has announced that the Office for Civil Rights (OCR) will exercise its enforcement discretion and will not impose penalties on physicians using telehealth in the event of noncompliance with regulatory requirements under the [place]  [place] (HIPAA) in connection with the good faith provision of telehealth during the YQIHK-74 national public health emergency. ([place])

## 2019-04-08 ENCOUNTER — Other Ambulatory Visit: Payer: Self-pay | Admitting: Nurse Practitioner

## 2019-04-08 DIAGNOSIS — R1084 Generalized abdominal pain: Secondary | ICD-10-CM

## 2019-04-08 DIAGNOSIS — K219 Gastro-esophageal reflux disease without esophagitis: Secondary | ICD-10-CM

## 2019-05-01 DIAGNOSIS — Z20828 Contact with and (suspected) exposure to other viral communicable diseases: Secondary | ICD-10-CM | POA: Diagnosis not present

## 2019-05-05 NOTE — Progress Notes (Signed)
Subjective:   Megan Perez is a 73 y.o. female who presents for Medicare Annual (Subsequent) preventive examination.  This visit is being conducted via phone call  - after an attmept to do on video chat - due to the COVID-19 pandemic. This patient has given me verbal consent via phone to conduct this visit, patient states they are participating from their home address. Some vital signs may be absent or patient reported.   Patient identification: identified by name, DOB, and current address.    Review of Systems:   Cardiac Risk Factors include: advanced age (>7555men, 54>65 women);dyslipidemia;hypertension     Objective:     Vitals: There were no vitals taken for this visit.  There is no height or weight on file to calculate BMI.  Advanced Directives 05/06/2019 03/04/2019 04/23/2018 06/19/2017 04/17/2017 02/16/2016 11/19/2015  Does Patient Have a Medical Advance Directive? No No No No No No No  Would patient like information on creating a medical advance directive? - No - Patient declined Yes (MAU/Ambulatory/Procedural Areas - Information given) - No - Patient declined Yes - Educational materials given No - patient declined information    Tobacco Social History   Tobacco Use  Smoking Status Never Smoker  Smokeless Tobacco Never Used     Counseling given: Not Answered   Clinical Intake:  Pre-visit preparation completed: Yes  Pain : 0-10 Pain Score: 8  Pain Type: Chronic pain Pain Location: Leg Pain Descriptors / Indicators: Aching, Numbness Pain Onset: More than a month ago Pain Frequency: Constant     Nutritional Risks: None Diabetes: No  How often do you need to have someone help you when you read instructions, pamphlets, or other written materials from your doctor or pharmacy?: 1 - Never  Interpreter Needed?: No  Information entered by :: [name] Hill,LPN  Past Medical History:  Diagnosis Date  . Anxiety   . Asthma    reactive airway and shortness of breath w/  exertion in hot temperatures  . Bronchitis   . GERD (gastroesophageal reflux disease)   . Hypertension   . Morbid obesity with BMI of 40.0-44.9, adult (HCC) 04/08/2012  . OA (osteoarthritis)    knee  . Osteoporosis   . Prediabetes   . Sinus infection    10/2018  . Venous (peripheral) insufficiency 08/19/2010   Qualifier: Diagnosis of  By: Laural BenesJohnson MD, [place]     Past Surgical History:  Procedure Laterality Date  . ABDOMINAL HYSTERECTOMY    . CARPAL TUNNEL RELEASE Right   . CHOLECYSTECTOMY    . COLONOSCOPY WITH PROPOFOL N/A 06/19/2017   Procedure: COLONOSCOPY WITH PROPOFOL;  Surgeon: Midge MiniumWohl, Darren, MD;  [facility];  Service: Endoscopy;  Laterality: N/A;  . HEEL SPUR SURGERY    . REPLACEMENT TOTAL KNEE     left  . REPLACEMENT TOTAL KNEE Right   . SHOULDER ARTHROSCOPY WITH OPEN ROTATOR CUFF REPAIR Right 09/15/2015   Procedure: SHOULDER ARTHROSCOPY WITH OPEN ROTATOR CUFF REPAIR;  Surgeon: Deeann SaintHoward Miller, MD;  Location: [facility] ORS;  Service: Orthopedics;  Laterality: Right;  . SHOULDER SURGERY Left    Family History  Adopted: Yes  Problem Relation Age of Onset  . Heart disease Father   . Subarachnoid hemorrhage Mother   . Hypertension Mother   . Subarachnoid hemorrhage Sister   . Hypertension Sister   . Hypertension Sister   . Hypertension Sister   . Healthy Sister   . Breast cancer Neg Hx    Social History   Socioeconomic  History  . Marital status: Widowed    Spouse name: Not on file  . Number of children: Not on file  . Years of education: Not on file  . Highest education level: Associate degree: academic program  Occupational History  . Not on file  Social Needs  . Financial resource strain: Not hard at all  . Food insecurity    Worry: Never true    Inability: Never true  . Transportation needs    Medical: No    Non-medical: No  Tobacco Use  . Smoking status: Never Smoker  . Smokeless tobacco: Never Used  Substance and Sexual Activity  . Alcohol  use: No  . Drug use: No  . Sexual activity: Yes    Birth control/protection: Surgical  Lifestyle  . Physical activity    Days per week: 0 days    Minutes per session: 0 min  . Stress: Not at all  Relationships  . Social connections    Talks on phone: More than three times a week    Gets together: More than three times a week    Attends religious service: More than 4 times per year    Active member of club or organization: No    Attends meetings of clubs or organizations: Never    Relationship status: Widowed  Other Topics Concern  . Not on file  Social History Narrative   Retired.  Lives alone    Outpatient Encounter Medications as of 05/06/2019  Medication Sig  . acetaminophen (TYLENOL) 500 MG tablet Take 500 mg by mouth every 6 (six) hours as needed.  [name] aspirin EC 81 MG tablet Take 81 mg by mouth daily.  [name] azelastine (OPTIVAR) 0.05 % ophthalmic solution INT 1 GTT INTO OU BID FOR 10 DAYS  . Blood Pressure Monitoring (B-D ASSURE BPM/AUTO WRIST CUFF) MISC 1 Device by Does not apply route daily.  . calcium carbonate (CALCIUM 600) 600 MG TABS tablet Take 1 tablet (600 mg total) by mouth 2 (two) times daily with a meal for 30 days.  . Cholecalciferol (VITAMIN D3) 125 MCG (5000 UT) CAPS Take 1 capsule (5,000 Units total) by mouth daily with breakfast. Take along with calcium and magnesium.  [name] diclofenac sodium (VOLTAREN) 1 % GEL APPLY 2 GRAMS TOPICALLY FOUR TIMES DAILY AS NEEDED FOR MODERATE PAIN OF BACK AND KNEES  . furosemide (LASIX) 40 MG tablet Take 1 tablet (40 mg total) by mouth daily.  [name] lisinopril (PRINIVIL,ZESTRIL) 20 MG tablet TAKE 1 TABLET(20 MG) BY MOUTH DAILY  . Magnesium 500 MG CAPS Take 1 capsule (500 mg total) by mouth 2 (two) times daily at 8 am and 10 pm.  . sucralfate (CARAFATE) 1 g tablet TAKE 1 TABLET(1 GRAM) BY MOUTH FOUR TIMES DAILY AT BEDTIME WITH MEALS AS NEEDED  . tiZANidine (ZANAFLEX) 2 MG tablet Take 1 tablet (2 mg total) by mouth every 8 (eight) hours as  needed for muscle spasms.  . [DISCONTINUED] pantoprazole (PROTONIX) 40 MG tablet Take 1 tablet (40 mg total) by mouth daily. (Patient not taking: Reported on 05/06/2019)   No facility-administered encounter medications on file as of 05/06/2019.     Activities of Daily Living In your present state of health, do you have any difficulty performing the following activities: 05/06/2019  Hearing? N  Comment no hearing aids  Vision? N  Comment glasses  Difficulty concentrating or making decisions? N  Walking or climbing stairs? N  Comment uses hand rails  Dressing or bathing? N  Doing errands, shopping? N  Preparing Food and eating ? N  Using the Toilet? N  In the past six months, have you accidently leaked urine? N  Do you have problems with loss of bowel control? N  Managing your Medications? N  Managing your Finances? N  Housekeeping or managing your Housekeeping? N  Some recent data might be hidden    Patient Care Team: Galen ManilaKennedy, Lauren Renee, NP as PCP - General (Nurse Practitioner) Laurier NancyKhan, Shaukat A, MD as Consulting Physician (Cardiology) Deeann SaintMiller, Howard, MD (Specialist)    Assessment:   This is a routine wellness examination for Megan Perez.  Exercise Activities and Dietary recommendations Current Exercise Habits: The patient does not participate in regular exercise at present, Exercise limited by: None identified  Goals    . DIET - INCREASE WATER INTAKE     Recommend drinking at least 6-8 glasses of water a day     . Increase water intake     Recommend drinking at least 3-4 bottles of water a day        Fall Risk: Fall Risk  05/06/2019 03/04/2019 12/04/2018 11/05/2018 04/23/2018  Falls in the past year? 0 0 0 1 Yes  Number falls in past yr: - - - 0 1  Injury with Fall? - - - 1 No  Comment - - - Passed out, dehydrated [facility] -  Risk for fall due to : - - - (No Data) -  Risk for fall due to: Comment - - - Fainted -  Follow up - - - - Falls evaluation completed;Falls prevention  discussed    FALL RISK PREVENTION PERTAINING TO THE HOME:  Any stairs in or around the home? No  If so, are there any without handrails? No   Home free of loose throw rugs in walkways, pet beds, electrical cords, etc? Yes  Adequate lighting in your home to reduce risk of falls? Yes   ASSISTIVE DEVICES UTILIZED TO PREVENT FALLS:  Life alert? yes Use of a cane, walker or w/c? No  Grab bars in the bathroom? Yes  Shower chair or bench in shower? Yes  Elevated toilet seat or a handicapped toilet? Yes   DME ORDERS:  DME order needed?  No   TIMED UP AND GO:  Unable to perform    Depression Screen PHQ 2/9 Scores 05/06/2019 03/04/2019 11/05/2018 04/23/2018  PHQ - 2 Score 0 0 0 0  PHQ- 9 Score - - 5 -  Exception Documentation - - (No Data) -     Cognitive Function     6CIT Screen 04/23/2018 04/17/2017  What Year? 0 points 0 points  What month? 0 points 0 points  What time? 0 points 0 points  Count back from 20 0 points 0 points  Months in reverse 0 points 0 points  Repeat phrase 2 points 0 points  Total Score 2 0    Immunization History  Administered Date(s) Administered  . Influenza Split 06/28/2012, 06/22/2015, 05/23/2016  . Influenza Whole 08/20/2010  . Influenza, High Dose Seasonal PF 08/06/2013, 06/15/2017, 07/16/2018  . Influenza, Seasonal, Injecte, Preservative Fre 06/11/2014  . Influenza,inj,Quad PF,6+ Mos 12/10/2014  . Influenza-Unspecified 04/20/2013  . PPD Test 05/01/2016  . Pneumococcal Conjugate-13 06/22/2015  . Pneumococcal Polysaccharide-23 06/28/2012  . Tdap 09/19/2007  . Zoster Recombinat (Shingrix) 03/14/2018, 07/31/2018    Qualifies for Shingles Vaccine? Completed shingrix series   Tdap: Discussed need for TD/TDAP vaccine, patient verbalized understanding that this is not covered as a preventative with there insurance  and to call the office if she develops any new skin injuries, ie: cuts, scrapes, bug bites, or open wounds.  Flu Vaccine: Due 05/2019   Pneumococcal Vaccine: up to date  Screening Tests Health Maintenance  Topic Date Due  . INFLUENZA VACCINE  04/19/2019  . DEXA SCAN  05/05/2020 (Originally 03/27/2010)  . TETANUS/TDAP  05/05/2020 (Originally 09/18/2017)  . MAMMOGRAM  04/17/2020  . COLONOSCOPY  06/20/2027  . Hepatitis C Screening  Completed  . PNA vac Low Risk Adult  Completed    Cancer Screenings:  Colorectal Screening: Completed 06/19/2017  Mammogram: Completed 04/17/2018. Request to do every 2 years.   Bone Density: due now, postponed until 2021  Lung Cancer Screening: (Low Dose CT Chest recommended if Age 52-80 years, 30 pack-year currently smoking OR have quit w/in 15years.) does not qualify.   Additional Screening:  Hepatitis C Screening: does not qualify; Completed 02/16/2016  Vision Screening: Recommended annual ophthalmology exams for early detection of glaucoma and other disorders of the eye. Is the patient up to date with their annual eye exam?  Yes    Dental Screening: Recommended annual dental exams for proper oral hygiene  Community Resource Referral:  CRR required this visit?  No       Plan:  I have personally reviewed and addressed the Medicare Annual Wellness questionnaire and have noted the following in the patient's chart:  A. Medical and social history B. Use of alcohol, tobacco or illicit drugs  C. Current medications and supplements D. Functional ability and status E.  Nutritional status F.  Physical activity G. Advance directives H. List of other physicians I.  Hospitalizations, surgeries, and ER visits in previous 12 months J.  Vitals K. Screenings such as hearing and vision if needed, cognitive and depression L. Referrals and appointments   In addition, I have reviewed and discussed with patient certain preventive protocols, quality metrics, and best practice recommendations. A written personalized care plan for preventive services as well as general preventive health  recommendations were provided to patient.  Signed,    Collene SchlichterHill, [name] A, LPN  [id number]/[id number]/2020 Nurse Health Advisor   Nurse Notes: patient states she is having increased pain in her leg and was requesting to increase her tizanidine. Discussed medication and she states she was taking it once a day. Per instructions patient can take every 8 hours as needed, discussed with patient and she asked if she could try taking one at night since she usually took one in the morning. Informed her as long as they were 8 hours apart per medication frequency, she is also requesting refill on tizanidine - will schedule follow up since she is having increase pain and per last note she is to follow up around this time.  She is also requesting bp cuff rx.

## 2019-05-06 ENCOUNTER — Ambulatory Visit (INDEPENDENT_AMBULATORY_CARE_PROVIDER_SITE_OTHER): Payer: Medicare HMO

## 2019-05-06 DIAGNOSIS — I10 Essential (primary) hypertension: Secondary | ICD-10-CM | POA: Diagnosis not present

## 2019-05-06 DIAGNOSIS — M62830 Muscle spasm of back: Secondary | ICD-10-CM

## 2019-05-06 DIAGNOSIS — Z0000 Encounter for general adult medical examination without abnormal findings: Secondary | ICD-10-CM

## 2019-05-06 MED ORDER — BD ASSURE BPM/AUTO WRIST CUFF MISC: 1.0000 | Freq: Every day | 0 refills | Status: DC

## 2019-05-06 MED ORDER — TIZANIDINE HCL 2 MG PO TABS: 2.0000 mg | ORAL_TABLET | Freq: Three times a day (TID) | ORAL | 0 refills | Status: DC | PRN

## 2019-05-06 NOTE — Patient Instructions (Signed)
Megan Perez , Thank you for taking time to come for your Medicare Wellness Visit. I appreciate your ongoing commitment to your health goals. Please review the following plan we discussed and let me know if I can assist you in the future.   Screening recommendations/referrals: Colonoscopy: completed 06/19/2017 Mammogram: completed 04/17/2017 Bone Density: due now, postponed until 2021 Recommended yearly ophthalmology/optometry visit for glaucoma screening and checkup Recommended yearly dental visit for hygiene and checkup  Vaccinations: Influenza vaccine: due 05/2019 Pneumococcal vaccine: up to date Tdap vaccine: due, check with your insurance for coverage Shingles vaccine: shingrix completed     Advanced directives: please pick up a copy of this information at your next office visit  Conditions/risks identified: discussed fall prevention   Next appointment: Follow up in one year for your annual wellness visit.    Preventive Care 28 Years and Older, Female Preventive care refers to lifestyle choices and visits with your health care provider that can promote health and wellness. What does preventive care include?  A yearly physical exam. This is also called an annual well check.  Dental exams once or twice a year.  Routine eye exams. Ask your health care provider how often you should have your eyes checked.  Personal lifestyle choices, including:  Daily care of your teeth and gums.  Regular physical activity.  Eating a healthy diet.  Avoiding tobacco and drug use.  Limiting alcohol use.  Practicing safe sex.  Taking low-dose aspirin every day.  Taking vitamin and mineral supplements as recommended by your health care provider. What happens during an annual well check? The services and screenings done by your health care provider during your annual well check will depend on your age, overall health, lifestyle risk factors, and family history of disease. Counseling   Your health care provider may ask you questions about your:  Alcohol use.  Tobacco use.  Drug use.  Emotional well-being.  Home and relationship well-being.  Sexual activity.  Eating habits.  History of falls.  Memory and ability to understand (cognition).  Work and work Statistician.  Reproductive health. Screening  You may have the following tests or measurements:  Height, weight, and BMI.  Blood pressure.  Lipid and cholesterol levels. These may be checked every 5 years, or more frequently if you are over 58 years old.  Skin check.  Lung cancer screening. You may have this screening every year starting at age 3 if you have a 30-pack-year history of smoking and currently smoke or have quit within the past 15 years.  Fecal occult blood test (FOBT) of the stool. You may have this test every year starting at age 58.  Flexible sigmoidoscopy or colonoscopy. You may have a sigmoidoscopy every 5 years or a colonoscopy every 10 years starting at age 3.  Hepatitis C blood test.  Hepatitis B blood test.  Sexually transmitted disease (STD) testing.  Diabetes screening. This is done by checking your blood sugar (glucose) after you have not eaten for a while (fasting). You may have this done every 1-3 years.  Bone density scan. This is done to screen for osteoporosis. You may have this done starting at age 53.  Mammogram. This may be done every 1-2 years. Talk to your health care provider about how often you should have regular mammograms. Talk with your health care provider about your test results, treatment options, and if necessary, the need for more tests. Vaccines  Your health care provider may recommend certain vaccines, such as:  Influenza vaccine. This is recommended every year.  Tetanus, diphtheria, and acellular pertussis (Tdap, Td) vaccine. You may need a Td booster every 10 years.  Zoster vaccine. You may need this after age 55.  Pneumococcal 13-valent  conjugate (PCV13) vaccine. One dose is recommended after age 73.  Pneumococcal polysaccharide (PPSV23) vaccine. One dose is recommended after age 11. Talk to your health care provider about which screenings and vaccines you need and how often you need them. This information is not intended to replace advice given to you by your health care provider. Make sure you discuss any questions you have with your health care provider. Document Released: 10/01/2015 Document Revised: 05/24/2016 Document Reviewed: 07/06/2015 Elsevier Interactive Patient Education  2017 [place] Prevention in the Home Falls can cause injuries. They can happen to people of all ages. There are many things you can do to make your home safe and to help prevent falls. What can I do on the outside of my home?  Regularly fix the edges of walkways and driveways and fix any cracks.  Remove anything that might make you trip as you walk through a door, such as a raised step or threshold.  Trim any bushes or trees on the path to your home.  Use bright outdoor lighting.  Clear any walking paths of anything that might make someone trip, such as rocks or tools.  Regularly check to see if handrails are loose or broken. Make sure that both sides of any steps have handrails.  Any raised decks and porches should have guardrails on the edges.  Have any leaves, snow, or ice cleared regularly.  Use sand or salt on walking paths during winter.  Clean up any spills in your garage right away. This includes oil or grease spills. What can I do in the bathroom?  Use night lights.  Install grab bars by the toilet and in the tub and shower. Do not use towel bars as grab bars.  Use non-skid mats or decals in the tub or shower.  If you need to sit down in the shower, use a plastic, non-slip stool.  Keep the floor dry. Clean up any water that spills on the floor as soon as it happens.  Remove soap buildup in the tub or  shower regularly.  Attach bath mats securely with double-sided non-slip rug tape.  Do not have throw rugs and other things on the floor that can make you trip. What can I do in the bedroom?  Use night lights.  Make sure that you have a light by your bed that is easy to reach.  Do not use any sheets or blankets that are too big for your bed. They should not hang down onto the floor.  Have a firm chair that has side arms. You can use this for support while you get dressed.  Do not have throw rugs and other things on the floor that can make you trip. What can I do in the kitchen?  Clean up any spills right away.  Avoid walking on wet floors.  Keep items that you use a lot in easy-to-reach places.  If you need to reach something above you, use a strong step stool that has a grab bar.  Keep electrical cords out of the way.  Do not use floor polish or wax that makes floors slippery. If you must use wax, use non-skid floor wax.  Do not have throw rugs and other things on the floor that  can make you trip. What can I do with my stairs?  Do not leave any items on the stairs.  Make sure that there are handrails on both sides of the stairs and use them. Fix handrails that are broken or loose. Make sure that handrails are as long as the stairways.  Check any carpeting to make sure that it is firmly attached to the stairs. Fix any carpet that is loose or worn.  Avoid having throw rugs at the top or bottom of the stairs. If you do have throw rugs, attach them to the floor with carpet tape.  Make sure that you have a light switch at the top of the stairs and the bottom of the stairs. If you do not have them, ask someone to add them for you. What else can I do to help prevent falls?  Wear shoes that:  Do not have high heels.  Have rubber bottoms.  Are comfortable and fit you well.  Are closed at the toe. Do not wear sandals.  If you use a stepladder:  Make sure that it is fully  opened. Do not climb a closed stepladder.  Make sure that both sides of the stepladder are locked into place.  Ask someone to hold it for you, if possible.  Clearly mark and make sure that you can see:  Any grab bars or handrails.  First and last steps.  Where the edge of each step is.  Use tools that help you move around (mobility aids) if they are needed. These include:  Canes.  Walkers.  Scooters.  Crutches.  Turn on the lights when you go into a dark area. Replace any light bulbs as soon as they burn out.  Set up your furniture so you have a clear path. Avoid moving your furniture around.  If any of your floors are uneven, fix them.  If there are any pets around you, be aware of where they are.  Review your medicines with your doctor. Some medicines can make you feel dizzy. This can increase your chance of falling. Ask your doctor what other things that you can do to help prevent falls. This information is not intended to replace advice given to you by your health care provider. Make sure you discuss any questions you have with your health care provider. Document Released: 07/01/2009 Document Revised: 02/10/2016 Document Reviewed: 10/09/2014 Elsevier Interactive Patient Education  2017 [employer].

## 2019-05-06 NOTE — Addendum Note (Signed)
Addended by: Cassell Smiles R on: 05/06/2019 10:02 AM   Modules accepted: Orders

## 2019-05-06 NOTE — Progress Notes (Signed)
Keep requested followup with appointment in next 30 days. - No dose increase, but will refill tizanidine for additional 30 days.  Continue to take tid prn. (can increase frequency as advised by Tyler Aas, LPN per original order) - BP machine Rx send to [facility].

## 2019-05-07 ENCOUNTER — Ambulatory Visit (INDEPENDENT_AMBULATORY_CARE_PROVIDER_SITE_OTHER): Payer: Medicare HMO | Admitting: Nurse Practitioner

## 2019-05-07 ENCOUNTER — Other Ambulatory Visit: Payer: Self-pay

## 2019-05-07 ENCOUNTER — Encounter: Payer: Self-pay | Admitting: Nurse Practitioner

## 2019-05-07 VITALS — BP 137/82 | HR 71 | Ht 67.0 in | Wt 273.0 lb

## 2019-05-07 DIAGNOSIS — M5441 Lumbago with sciatica, right side: Secondary | ICD-10-CM | POA: Diagnosis not present

## 2019-05-07 DIAGNOSIS — G8929 Other chronic pain: Secondary | ICD-10-CM | POA: Diagnosis not present

## 2019-05-07 DIAGNOSIS — M79604 Pain in right leg: Secondary | ICD-10-CM

## 2019-05-07 DIAGNOSIS — M62830 Muscle spasm of back: Secondary | ICD-10-CM | POA: Diagnosis not present

## 2019-05-07 MED ORDER — TIZANIDINE HCL 4 MG PO TABS: ORAL_TABLET | ORAL | 2 refills | Status: DC

## 2019-05-07 NOTE — Progress Notes (Signed)
Subjective:    Patient ID: Megan Perez, female    DOB: 02/22/1946, 73 y.o.   MRN: [id number]  Megan Perez is a 73 y.o. female presenting on 05/07/2019 for Leg Pain (Right leg pain that radiates down the buttuck into the foot x 1 week. Pt describe it as a throbbing pain in the buttocks. The pt had a Nerve block procedure done x 12/04/18)  HPI Back pain with sciatica Patient has chronic low back pain with sciatica of both legs.  Now notes her R leg tingling along the full length of her leg and Left leg tingles near ankle.  Patient desires improved pain control.  Patient asks if it is "safe" to take tizanidine every 8 hrs.   - Tizanidine helps, but patient notes she is very sleepy when taking.  Patient is not working, so this is not a problem.  When she takes in am, sleeps.  Last night did not help at all.  In bed last night felt pressure in left hip joint and buttock.   - Patient has seen Dr[facility].  She received a nerve block 03/04/2019 that initially provided great relief.  Today, nerve block is no longer working that was still providing relief at her visit with them on 03/19/2019.  No scheduled follow-up with Dr. Laban EmperorNaveira.  Was instructed to call if needing 2nd diagnostic nerve block.    Social History   Tobacco Use  . Smoking status: Never Smoker  . Smokeless tobacco: Never Used  Substance Use Topics  . Alcohol use: No  . Drug use: No    Review of Systems Per HPI unless specifically indicated above     Objective:    BP 137/82 (BP Location: Right Arm, Patient Position: Sitting, Cuff Size: Large)   Pulse 71   Ht 5\' 7"  (1.702 m)   Wt 273 lb (123.8 kg)   BMI 42.76 kg/m   Wt Readings from Last 3 Encounters:  05/07/19 273 lb (123.8 kg)  03/04/19 283 lb (128.4 kg)  12/04/18 281 lb (127.5 kg)    Physical Exam Vitals signs reviewed.  Constitutional:      General: She is not in acute distress.    Appearance: She is well-developed.  HENT:     Head:  Normocephalic and atraumatic.  Cardiovascular:     Rate and Rhythm: Normal rate and regular rhythm.     Pulses:          Radial pulses are 2+ on the right side and 2+ on the left side.       Posterior tibial pulses are 1+ on the right side and 1+ on the left side.     Heart sounds: Normal heart sounds, S1 normal and S2 normal.  Pulmonary:     Effort: Pulmonary effort is normal. No respiratory distress.     Breath sounds: Normal breath sounds and air entry.  Musculoskeletal:     Right lower leg: No edema.     Left lower leg: No edema.     Comments: Low Back Inspection: Normal appearance, Large body habitus, no spinal deformity, symmetrical. Palpation: No tenderness over spinous processes. Bilateral lumbar paraspinal muscles tender and with hypertonicity/spasm. ROM: Moderately limited forward flexion due to pain.  AROM back extension, rotation L/R without discomfort. Special Testing: Seated SLR negative for radicular pain bilaterally, but has reproduced midline back pain. Strength: Bilateral hip flex/ext 5/5, knee flex/ext 5/5, ankle dorsiflex/plantarflex 5/5 Neurovascular: intact distal sensation to light  touch   Right leg/Left Ankle: Inspection: normal appearance without injury or ecchymosis, symmetrical Palpation: non-tender to touch ROM: normal knee, ankle AROM bilaterally Special Testing: none Strength: 5/5 Neurovascular: intact sensation to light touch, good cap refill   Skin:    General: Skin is warm and dry.     Capillary Refill: Capillary refill takes less than 2 seconds.  Neurological:     Mental Status: She is alert and oriented to person, place, and time. Mental status is at baseline.  Psychiatric:        Attention and Perception: Attention normal.        Mood and Affect: Mood and affect normal.        Behavior: Behavior normal. Behavior is cooperative.        Thought Content: Thought content normal.        Judgment: Judgment normal.    Results for orders placed or  performed during the hospital encounter of 02/28/19  Novel Coronavirus, NAA (hospital order; send-out to ref lab)   Specimen: Nasopharyngeal Swab; Respiratory  Result Value Ref Range   SARS-CoV-2, NAA NOT DETECTED NOT DETECTED   Coronavirus Source NASOPHARYNGEAL       Assessment & Plan:   Problem List Items Addressed This Visit      Nervous and Auditory   Chronic low back pain (Primary area of Pain) (Bilateral) (R>L) w/ sciatica (Right) - Primary (Chronic)   Relevant Medications   tiZANidine (ZANAFLEX) 4 MG tablet     Other   Chronic lower extremity pain (Secondary Area of Pain) (Right) (Chronic)   Relevant Medications   tiZANidine (ZANAFLEX) 4 MG tablet    Other Visit Diagnoses    Spasm of muscle of lower back       Relevant Medications   tiZANidine (ZANAFLEX) 4 MG tablet     Pain chronic, worsening after nerve block at this time, which provided good relief for approx 3 months.  Plan:  1. Continue to treat prn with OTC pain meds (acetaminophen and ibuprofen).  Discussed alternate dosing and max dosing. 2. Apply heat and/or ice to affected area. 3. May also apply a muscle rub with lidocaine or lidocaine patch after heat or ice. 4. Take muscle relaxer tizanidine 2-4 mg up to three times daily.  Cautioned drowsiness.  Take lower dose in daytime prn. 5. Follow up with pain management to complete course of procedures since these are helpful.  Patient has concerns about how many injections she can have, concern for joint degradation.  Encouraged patient to discuss with pain management prior to next injection.    Meds ordered this encounter  Medications  . tiZANidine (ZANAFLEX) 4 MG tablet    Sig: Take 1 tablet (4 mg) at bedtime and take 1/2 tablet (2 mg) during daytime if needed for back pain, muscle spasm.    Dispense:  45 tablet    Refill:  2    Order Specific Question:   Supervising Provider    Answer:   Olin Hauser [[id number]]   Follow up plan: Return in about 3  months (around 08/07/2019) for hypertension, pre-diabetes.  Cassell Smiles, DNP, AGPCNP-BC Adult Gerontology Primary Care Nurse Practitioner [facility] 05/07/2019, 1:59 PM

## 2019-05-07 NOTE — Patient Instructions (Addendum)
Naida Sleight,   Thank you for coming in to clinic today.  1. Call back to Dr. Adalberto Cole office to consider how soon they will do your repeat injection.    2. In the short term,  TAKE tizanidine 4 mg at bedtime.  Be careful for drowsiness and dizziness in the night with the higher dose. No falls.  If you have a fall, also call us to let us know.  IF NEEDED, you may also take 2 mg during the day.  Please schedule a follow-up appointment with Cassell Smiles, AGNP. Return in about 3 months (around 08/07/2019) for hypertension, pre-diabetes.   If you have any other questions or concerns, please feel free to call the clinic or send a message through [place]. You may also schedule an earlier appointment if necessary.  You will receive a survey after today's visit either digitally by e-mail or paper by [employer]. Your experiences and feedback matter to [place].  Please respond so we know how we are doing as we provide care for you.   Cassell Smiles, DNP, AGNP-BC Adult Gerontology Nurse Practitioner [place]

## 2019-05-12 ENCOUNTER — Telehealth: Payer: Self-pay | Admitting: Nurse Practitioner

## 2019-05-12 NOTE — Chronic Care Management (AMB) (Signed)
Chronic Care Management   Note  05/12/2019 Name: Megan Perez MRN: [id number] DOB: 1946/05/24  Megan Perez is a 73 y.o. year old female who is a primary care patient of Mikey College, NP. I reached out to Megan Perez by phone today in response to a referral sent by Megan Perez's health plan.    Megan Perez was given information about Chronic Care Management services today including:  1. CCM service includes personalized support from designated clinical staff supervised by her physician, including individualized plan of care and coordination with other care providers 2. 24/7 contact phone numbers for assistance for urgent and routine care needs. 3. Service will only be billed when office clinical staff spend 20 minutes or more in a month to coordinate care. 4. Only one practitioner may furnish and bill the service in a calendar month. 5. The patient may stop CCM services at any time (effective at the end of the month) by phone call to the office staff. 6. The patient will be responsible for cost sharing (co-pay) of up to 20% of the service fee (after annual deductible is met).  Patient agreed to services and verbal consent obtained.   Follow up plan: Telephone appointment with CCM team member scheduled for: 06/30/2019  Santel  ??[email or web address]   ??[phone]

## 2019-05-14 ENCOUNTER — Encounter: Payer: Self-pay | Admitting: Nurse Practitioner

## 2019-05-20 ENCOUNTER — Ambulatory Visit: Payer: Medicare HMO | Admitting: Nurse Practitioner

## 2019-05-28 ENCOUNTER — Ambulatory Visit (INDEPENDENT_AMBULATORY_CARE_PROVIDER_SITE_OTHER): Payer: Medicare HMO | Admitting: Nurse Practitioner

## 2019-05-28 ENCOUNTER — Encounter: Payer: Self-pay | Admitting: Nurse Practitioner

## 2019-05-28 ENCOUNTER — Other Ambulatory Visit: Payer: Self-pay

## 2019-05-28 VITALS — BP 123/69 | HR 74 | Ht 67.0 in | Wt 275.0 lb

## 2019-05-28 DIAGNOSIS — Z6841 Body Mass Index (BMI) 40.0 and over, adult: Secondary | ICD-10-CM | POA: Diagnosis not present

## 2019-05-28 DIAGNOSIS — G8929 Other chronic pain: Secondary | ICD-10-CM

## 2019-05-28 DIAGNOSIS — I10 Essential (primary) hypertension: Secondary | ICD-10-CM | POA: Diagnosis not present

## 2019-05-28 DIAGNOSIS — Z23 Encounter for immunization: Secondary | ICD-10-CM | POA: Diagnosis not present

## 2019-05-28 DIAGNOSIS — R7303 Prediabetes: Secondary | ICD-10-CM

## 2019-05-28 DIAGNOSIS — M5441 Lumbago with sciatica, right side: Secondary | ICD-10-CM

## 2019-05-28 NOTE — Progress Notes (Signed)
Subjective:    Patient ID: Megan Perez, female    DOB: 15-Sep-1946, 73 y.o.   MRN: [id number]  Megan Perez is a 73 y.o. female presenting on 05/28/2019 for Leg Pain (constant Right hip pain that radiates down the leg. Numbness in the toes on the right side. Also pain on the top of the left foot and toes. )  HPI Leg pain RIGHT hip pain - med helps, but not resolved fully - now in both legs. Plan for procedure on 9/24 at 8:30 am.  Was scheduled this am.  :like lightening going down my leg, toes is numb, top of feet are burning . Topical creme, aspercreme lidocaine soothes for short term.  Uses 2-3 times per day.  Lasts about 3 hours. - Patches would not stay on skin.   - Tylenol arthritis 650 mg - took 2 and repeated dose twice daily.  Patient took tizanidine 4 mg and layed down.    Prediabetes Pt presents today for follow up of prediabetes. She is not checking CBG at home - Current diabetic medications include: none - She is not currently symptomatic.  - She denies polydipsia, polyphagia, polyuria, headaches, diaphoresis, shakiness, chills, pain, numbness or tingling in extremities and changes in vision.   - Clinical course has been stable. - She  reports no regular exercise routine. - Her diet is moderate in salt, moderate in fat, and moderate in carbohydrates. - Weight trend: decreasing steadily - has lost more than 10 lbs at home scale  Recent Labs    10/03/18 0902 05/28/19 1116  HGBA1C 5.9* 6.1*     Hypertension - She is not checking BP at home or outside of clinic.    - Current medications: furosemide 40 mg every other day, lisinopril 20 mg daily,  tolerating well without side effects - She is not currently symptomatic. - Pt denies headache, lightheadedness, dizziness, changes in vision, chest tightness/pressure, palpitations, leg swelling, sudden loss of speech or loss of consciousness.  Social History   Tobacco Use  . Smoking status: Never Smoker  . Smokeless tobacco:  Never Used  Substance Use Topics  . Alcohol use: No  . Drug use: No    Review of Systems Per HPI unless specifically indicated above     Objective:    BP 123/69 (BP Location: Right Arm, Patient Position: Sitting, Cuff Size: Large)   Pulse 74   Ht 5\' 7"  (1.702 m)   Wt 275 lb (124.7 kg)   BMI 43.07 kg/m   Wt Readings from Last 3 Encounters:  05/28/19 275 lb (124.7 kg)  05/07/19 273 lb (123.8 kg)  03/04/19 283 lb (128.4 kg)    Physical Exam Vitals signs reviewed.  Constitutional:      General: She is not in acute distress.    Appearance: She is well-developed.  HENT:     Head: Normocephalic and atraumatic.  Cardiovascular:     Rate and Rhythm: Normal rate and regular rhythm.     Pulses:          Radial pulses are 2+ on the right side and 2+ on the left side.       Posterior tibial pulses are 1+ on the right side and 1+ on the left side.     Heart sounds: Normal heart sounds, S1 normal and S2 normal.  Pulmonary:     Effort: Pulmonary effort is normal. No respiratory distress.     Breath sounds: Normal breath sounds and air entry.  Musculoskeletal:     Right lower leg: No edema.     Left lower leg: No edema.     Comments: Low Back Inspection: Normal appearance, Large body habitus, no spinal deformity, symmetrical. Palpation: No tenderness over spinous processes. Bilateral lumbar paraspinal muscles tender and with hypertonicity/spasm. ROM: Moderately limited forward flexion due to pain.  AROM back extension, rotation L/R without discomfort. Special Testing: Seated SLR negative for radicular pain bilaterally, but has reproduced midline back pain. Strength: Bilateral hip flex/ext 5/5, knee flex/ext 5/5, ankle dorsiflex/plantarflex 5/5 Neurovascular: intact distal sensation to light touch   Right leg: Inspection: normal appearance without injury or ecchymosis, symmetrical Palpation: non-tender to touch ROM: normal knee, ankle AROM bilaterally Special Testing: none  Strength: 5/5 Neurovascular: intact sensation to light touch, good cap refill   Skin:    General: Skin is warm and dry.     Capillary Refill: Capillary refill takes less than 2 seconds.  Neurological:     Mental Status: She is alert and oriented to person, place, and time. Mental status is at baseline.  Psychiatric:        Attention and Perception: Attention normal.        Mood and Affect: Mood and affect normal.        Behavior: Behavior normal. Behavior is cooperative.        Thought Content: Thought content normal.        Judgment: Judgment normal.    Results for orders placed or performed during the hospital encounter of 02/28/19  Novel Coronavirus, NAA (hospital order; send-out to ref lab)   Specimen: Nasopharyngeal Swab; Respiratory  Result Value Ref Range   SARS-CoV-2, NAA NOT DETECTED NOT DETECTED   Coronavirus Source NASOPHARYNGEAL       Assessment & Plan:   Problem List Items Addressed This Visit      Cardiovascular and Mediastinum   Essential hypertension Controlled hypertension.  BP goal < 130/80.  Pt is not working on lifestyle modifications.  Taking medications tolerating well without side effects.   Plan: 1. Continue taking medications without changes 2. Obtain labs today  3. Encouraged heart healthy diet and increasing exercise to 30 minutes most days of the week. 4. Check BP 1-2 x per week at home, keep log, and bring to clinic at next appointment. 5. Follow up 6 months.     Relevant Orders   COMPLETE METABOLIC PANEL WITH GFR (Completed)     Nervous and Auditory   Chronic low back pain (Primary area of Pain) (Bilateral) (R>L) w/ sciatica (Right) (Chronic) Patient with severe sciatica and paresthesia, neuropathic pain.  Patient desires to discuss medications to bridge to her next pain management procedure near the end of the month.    Plan: 1. Patient may take up to 3000 mg acetaminophen daily in divided doses 2. Patient may take up to 8 mg tizanidine  daily in divided doses - cautioned dizziness, drowsiness 3. Continue lidocaine gel up to 4-5 times daily prn to affected areas 4. Follow-up with pain management as scheduled for next injection 5. Follow-up prn.     Other   Morbid obesity with BMI of 40.0-44.9, adult (HCC) Generally stable to steadily decreasing.  Encouraged ongoing weight loss to improve pain symptoms. Follow-up 3 months.    Prediabetes - Primary Stable today on exam.  Medications tolerated without side effects.  Continue diet modifications, weight loss.   .  Check labs today. Followup 6 months.    Relevant Orders   Hemoglobin A1c (  Completed)   Lipid panel (Completed)   COMPLETE METABOLIC PANEL WITH GFR (Completed)    Other Visit Diagnoses    Need for immunization against influenza       Relevant Orders   Flu Vaccine QUAD High Dose(Fluad) (Completed)      Follow up plan: Return in about 6 months (around 11/25/2019) for prediabetes, hypertension.  Cassell Smiles, DNP, AGPCNP-BC Adult Gerontology Primary Care Nurse Practitioner [facility] 05/28/2019, 10:37 AM

## 2019-05-28 NOTE — Patient Instructions (Addendum)
Megan Perez,   Thank you for coming in to clinic today.  1. Use your lidocaine gel/cream up to 4-5 times daily as needed.  2. Take Tylenol Extra Strength (500 mg) two pills once during day.  3. Take Tylenol Arthritis (650 mg) two pills at bedtime  4. Take tizanidine 2 mg up to twice daily during day AND 4 mg at bedtime.  Dizziness is a side effect of this medicine.  Use caution when up and walking around after taking.  Use your cane at home.  5. Continue blood pressure medication without change  6. Continue with lifestyle improvements for prediabetes.  Great work with weight loss!  Please schedule a follow-up appointment with Cassell Smiles, AGNP. Return in about 6 months (around 11/25/2019) for prediabetes, hypertension.  If you have any other questions or concerns, please feel free to call the clinic or send a message through [place]. You may also schedule an earlier appointment if necessary.  You will receive a survey after today's visit either digitally by e-mail or paper by [employer]. Your experiences and feedback matter to [place].  Please respond so we know how we are doing as we provide care for you.   Cassell Smiles, DNP, AGNP-BC Adult Gerontology Nurse Practitioner [place]

## 2019-05-29 LAB — LIPID PANEL
Cholesterol: 189 mg/dL (ref <200)
HDL: 60 mg/dL (ref >=50)
LDL Cholesterol (Calc): 114 mg/dL (calc) — ABNORMAL HIGH
Non-HDL Cholesterol (Calc): 129 mg/dL (calc) (ref <130)
Total CHOL/HDL Ratio: 3.2 (calc) (ref <5.0)
Triglycerides: 66 mg/dL (ref <150)

## 2019-05-29 LAB — COMPLETE METABOLIC PANEL WITH GFR
AG Ratio: 1.4 (calc) (ref 1.0–2.5)
ALT: 8 U/L (ref 6–29)
AST: 11 U/L (ref 10–35)
Albumin: 4 g/dL (ref 3.6–5.1)
Alkaline phosphatase (APISO): 52 U/L (ref 37–153)
BUN/Creatinine Ratio: 18 (calc) (ref 6–22)
BUN: 19 mg/dL (ref 7–25)
CO2: 27 mmol/L (ref 20–32)
Calcium: 9.7 mg/dL (ref 8.6–10.4)
Chloride: 105 mmol/L (ref 98–110)
Creat: 1.03 mg/dL — ABNORMAL HIGH (ref 0.60–0.93)
GFR, Est African American: 62 mL/min/{1.73_m2} (ref >=60)
GFR, Est Non African American: 54 mL/min/{1.73_m2} — ABNORMAL LOW (ref >=60)
Globulin: 2.8 g/dL (calc) (ref 1.9–3.7)
Glucose, Bld: 98 mg/dL (ref 65–99)
Potassium: 5.1 mmol/L (ref 3.5–5.3)
Sodium: 142 mmol/L (ref 135–146)
Total Bilirubin: 0.3 mg/dL (ref 0.2–1.2)
Total Protein: 6.8 g/dL (ref 6.1–8.1)

## 2019-05-29 LAB — HEMOGLOBIN A1C
Hgb A1c MFr Bld: 6.1 % of total Hgb — ABNORMAL HIGH (ref <5.7)
Mean Plasma Glucose: 128 (calc)
eAG (mmol/L): 7.1 (calc)

## 2019-05-30 ENCOUNTER — Encounter: Payer: Self-pay | Admitting: Nurse Practitioner

## 2019-06-12 ENCOUNTER — Ambulatory Visit
Admission: RE | Admit: 2019-06-12 | Discharge: 2019-06-12 | Disposition: A | Discharge status: Home / Self Care | Payer: Medicare HMO | Source: Ambulatory Visit | Attending: Pain Medicine | Admitting: Pain Medicine

## 2019-06-12 ENCOUNTER — Ambulatory Visit (HOSPITAL_BASED_OUTPATIENT_CLINIC_OR_DEPARTMENT_OTHER): Payer: Medicare HMO | Admitting: Pain Medicine

## 2019-06-12 ENCOUNTER — Other Ambulatory Visit: Payer: Self-pay

## 2019-06-12 ENCOUNTER — Encounter: Payer: Self-pay | Admitting: Pain Medicine

## 2019-06-12 ENCOUNTER — Other Ambulatory Visit: Payer: Self-pay | Admitting: Nurse Practitioner

## 2019-06-12 VITALS — BP 150/70 | HR 72 | Temp 97.2°F | Resp 16 | Ht 67.0 in | Wt 268.0 lb

## 2019-06-12 DIAGNOSIS — M545 Low back pain, unspecified: Secondary | ICD-10-CM | POA: Insufficient documentation

## 2019-06-12 DIAGNOSIS — G8929 Other chronic pain: Secondary | ICD-10-CM

## 2019-06-12 DIAGNOSIS — M5137 Other intervertebral disc degeneration, lumbosacral region: Secondary | ICD-10-CM | POA: Insufficient documentation

## 2019-06-12 DIAGNOSIS — M47816 Spondylosis without myelopathy or radiculopathy, lumbar region: Secondary | ICD-10-CM

## 2019-06-12 DIAGNOSIS — M4310 Spondylolisthesis, site unspecified: Secondary | ICD-10-CM | POA: Insufficient documentation

## 2019-06-12 DIAGNOSIS — M47817 Spondylosis without myelopathy or radiculopathy, lumbosacral region: Secondary | ICD-10-CM

## 2019-06-12 DIAGNOSIS — I10 Essential (primary) hypertension: Secondary | ICD-10-CM

## 2019-06-12 MED ORDER — ROPIVACAINE HCL 2 MG/ML IJ SOLN
18.0000 mL | Freq: Once | INTRAMUSCULAR | Status: AC
  Administered 2019-06-12: 18 mL via PERINEURAL
  Filled 2019-06-12: qty 20

## 2019-06-12 MED ORDER — LIDOCAINE HCL 2 % IJ SOLN
20.0000 mL | Freq: Once | INTRAMUSCULAR | Status: AC
  Administered 2019-06-12: 400 mg
  Filled 2019-06-12: qty 40

## 2019-06-12 MED ORDER — FENTANYL CITRATE (PF) 100 MCG/2ML IJ SOLN
25.0000 ug | INTRAMUSCULAR | Status: DC | PRN
  Administered 2019-06-12: 50 ug via INTRAVENOUS
  Filled 2019-06-12: qty 2

## 2019-06-12 MED ORDER — LACTATED RINGERS IV SOLN
1000.0000 mL | Freq: Once | INTRAVENOUS | Status: AC
  Administered 2019-06-12: 1000 mL via INTRAVENOUS

## 2019-06-12 MED ORDER — MIDAZOLAM HCL 5 MG/5ML IJ SOLN
1.0000 mg | INTRAMUSCULAR | Status: DC | PRN
  Administered 2019-06-12: 2 mg via INTRAVENOUS
  Filled 2019-06-12: qty 5

## 2019-06-12 MED ORDER — TRIAMCINOLONE ACETONIDE 40 MG/ML IJ SUSP
80.0000 mg | Freq: Once | INTRAMUSCULAR | Status: AC
  Administered 2019-06-12: 80 mg
  Filled 2019-06-12: qty 2

## 2019-06-12 NOTE — Patient Instructions (Addendum)
____________________________________________________________________________________________  Post-Procedure Discharge Instructions  Instructions:  Apply ice:   Purpose: This will minimize any swelling and discomfort after procedure.   When: Day of procedure, as soon as you get home.  How: Fill a plastic sandwich bag with crushed ice. Cover it with a small towel and apply to injection site.  How long: (15 min on, 15 min off) Apply for 15 minutes then remove x 15 minutes.  Repeat sequence on day of procedure, until you go to bed.  Apply heat:   Purpose: To treat any soreness and discomfort from the procedure.  When: Starting the next day after the procedure.  How: Apply heat to procedure site starting the day following the procedure.  How long: May continue to repeat daily, until discomfort goes away.  Food intake: Start with clear liquids (like water) and advance to regular food, as tolerated.   Physical activities: Keep activities to a minimum for the first 8 hours after the procedure. After that, then as tolerated.  Driving: If you have received any sedation, be responsible and do not drive. You are not allowed to drive for 24 hours after having sedation.  Blood thinner: (Applies only to those taking blood thinners) You may restart your blood thinner 6 hours after your procedure.  Insulin: (Applies only to Diabetic patients taking insulin) As soon as you can eat, you may resume your normal dosing schedule.  Infection prevention: Keep procedure site clean and dry. Shower daily and clean area with soap and water.  Post-procedure Pain Diary: Extremely important that this be done correctly and accurately. Recorded information will be used to determine the next step in treatment. For the purpose of accuracy, follow these rules:  Evaluate only the area treated. Do not report or include pain from an untreated area. For the purpose of this evaluation, ignore all other areas of pain,  except for the treated area.  After your procedure, avoid taking a long nap and attempting to complete the pain diary after you wake up. Instead, set your alarm clock to go off every hour, on the hour, for the initial 8 hours after the procedure. Document the duration of the numbing medicine, and the relief you are getting from it.  Do not go to sleep and attempt to complete it later. It will not be accurate. If you received sedation, it is likely that you were given a medication that may cause amnesia. Because of this, completing the diary at a later time may cause the information to be inaccurate. This information is needed to plan your care.  Follow-up appointment: Keep your post-procedure follow-up evaluation appointment after the procedure (usually 2 weeks for most procedures, 6 weeks for radiofrequencies). DO NOT FORGET to bring you pain diary with you.   Expect: (What should I expect to see with my procedure?)  From numbing medicine (AKA: Local Anesthetics): Numbness or decrease in pain. You may also experience some weakness, which if present, could last for the duration of the local anesthetic.  Onset: Full effect within 15 minutes of injected.  Duration: It will depend on the type of local anesthetic used. On the average, 1 to 8 hours.   From steroids (Applies only if steroids were used): Decrease in swelling or inflammation. Once inflammation is improved, relief of the pain will follow.  Onset of benefits: Depends on the amount of swelling present. The more swelling, the longer it will take for the benefits to be seen. In some cases, up to 10 days.    Duration: Steroids will stay in the system x 2 weeks. Duration of benefits will depend on multiple posibilities including persistent irritating factors.  Side-effects: If present, they may typically last 2 weeks (the duration of the steroids).  Frequent: Cramps (if they occur, drink Gatorade and take over-the-counter Magnesium 450-500 mg  once to twice a day); water retention with temporary weight gain; increases in blood sugar; decreased immune system response; increased appetite.  Occasional: Facial flushing (red, warm cheeks); mood swings; menstrual changes.  Uncommon: Long-term decrease or suppression of natural hormones; bone thinning. (These are more common with higher doses or more frequent use. This is why we prefer that our patients avoid having any injection therapies in other practices.)   Very Rare: Severe mood changes; psychosis; aseptic necrosis.  From procedure: Some discomfort is to be expected once the numbing medicine wears off. This should be minimal if ice and heat are applied as instructed.  Call if: (When should I call?)  You experience numbness and weakness that gets worse with time, as opposed to wearing off.  New onset bowel or bladder incontinence. (Applies only to procedures done in the spine)  Emergency Numbers:  Durning business hours (Monday - Thursday, 8:00 AM - 4:00 PM) (Friday, 9:00 AM - 12:00 Noon): [phone]  After hours: [phone]  NOTE: If you are having a problem and are unable connect with, or to talk to a provider, then go to your nearest urgent care or emergency department. If the problem is serious and urgent, please call 911. ____________________________________________________________________________________________   Radiofrequency Lesioning Radiofrequency lesioning is a procedure that is performed to relieve pain. The procedure is often used for back, neck, or arm pain. Radiofrequency lesioning involves the use of a machine that creates radio waves to make heat. During the procedure, the heat is applied to the nerve that carries the pain signal. The heat damages the nerve and interferes with the pain signal. Pain relief usually starts about 2 weeks after the procedure and lasts for 6 months to 1 year. You will be awake during the procedure. You will need to be able to  talk with the health care provider during the procedure. Tell a health care provider about:  Any allergies you have.  All medicines you are taking, including vitamins, herbs, eye drops, creams, and over-the-counter medicines.  Any problems you or family members have had with anesthetic medicines.  Any blood disorders you have.  Any surgeries you have had.  Any medical conditions you have or have had.  Whether you are pregnant or may be pregnant. What are the risks? Generally, this is a safe procedure. However, problems may occur, including:  Pain or soreness at the injection site.  Allergic reaction to medicines given during the procedure.  Bleeding.  Infection at the injection site.  Damage to nerves or blood vessels. What happens before the procedure? Staying hydrated Follow instructions from your health care provider about hydration, which may include:  Up to 2 hours before the procedure - you may continue to drink clear liquids, such as water, clear fruit juice, black coffee, and plain tea. Eating and drinking Follow instructions from your health care provider about eating and drinking, which may include:  8 hours before the procedure - stop eating heavy meals or foods, such as meat, fried foods, or fatty foods.  6 hours before the procedure - stop eating light meals or foods, such as toast or cereal.  6 hours before the procedure - stop drinking   milk or drinks that contain milk.  2 hours before the procedure - stop drinking clear liquids. Medicines Ask your health care provider about:  Changing or stopping your regular medicines. This is especially important if you are taking diabetes medicines or blood thinners.  Taking medicines such as aspirin and ibuprofen. These medicines can thin your blood. Do not take these medicines unless your health care provider tells you to take them.  Taking over-the-counter medicines, vitamins, herbs, and supplements. General  instructions  Plan to have someone take you home from the hospital or clinic.  If you will be going home right after the procedure, plan to have someone with you for 24 hours.  Ask your health care provider what steps will be taken to help prevent infection. These may include: ? Removing hair at the procedure site. ? Washing skin with a germ-killing soap. ? Taking antibiotic medicine. What happens during the procedure?   An IV will be inserted into one of your veins.  You will be given one or more of the following: ? A medicine to help you relax (sedative). ? A medicine to numb the area (local anesthetic).  Your health care provider will insert a radiofrequency needle into the area to be treated. This is done with the help of a type of X-ray (fluoroscopy).  A wire that carries the radio waves (electrode) will be put through the radiofrequency needle.  An electrical pulse will be sent through the electrode to verify the correct nerve that is causing your pain. You will feel a tingling sensation, and you may have muscle twitching.  The tissue around the needle tip will be heated by an electric current that comes from the radiofrequency machine. This will numb the nerves.  The needle will be removed.  A bandage (dressing) will be put on the insertion area. The procedure may vary among health care providers and hospitals. What happens after the procedure?  Your blood pressure, heart rate, breathing rate, and blood oxygen level will be monitored until you leave the hospital or clinic.  Return to your normal activities as told by your health care provider. Ask your health care provider what activities are safe for you.  Do not drive for 24 hours if you were given a sedative during your procedure. Summary  Radiofrequency lesioning is a procedure that is performed to relieve pain. The procedure is often used for back, neck, or arm pain.  Radiofrequency lesioning involves the use of  a machine that creates radio waves to make heat.  Plan to have someone take you home from the hospital or clinic. Do not drive for 24 hours if you were given a sedative during your procedure.  Return to your normal activities as told by your health care provider. Ask your health care provider what activities are safe for you. This information is not intended to replace advice given to you by your health care provider. Make sure you discuss any questions you have with your health care provider. Document Released: 05/03/2011 Document Revised: 05/23/2018 Document Reviewed: 05/23/2018 [employer] Patient Education  2020 [employer]  

## 2019-06-12 NOTE — Progress Notes (Signed)
Patient's Name: Megan Perez  MRN: [id number]  Referring Provider: Delano Metz, MD  DOB: 1946/03/19  PCP: Galen Manila, NP  DOS: 06/12/2019  Note by: Oswaldo Done, MD  Service setting: Ambulatory outpatient  Specialty: Interventional Pain Management  Patient type: Established  Location: [facility] (AMB) Pain Management Facility  Visit type: Interventional Procedure   Primary Reason for Visit: Interventional Pain Management Treatment. CC: Back Pain  Procedure:          Anesthesia, Analgesia, Anxiolysis:  Type: Lumbar Facet, Medial Branch Block(s) #2  Primary Purpose: Diagnostic Region: Posterolateral Lumbosacral Spine Level: L2, L3, L4, L5, & S1 Medial Branch Level(s). Injecting these levels blocks the L3-4, L4-5, and L5-S1 lumbar facet joints. Laterality: Bilateral  Type: Moderate (Conscious) Sedation combined with Local Anesthesia Indication(s): Analgesia and Anxiety Route: Intravenous (IV) IV Access: Secured Sedation: Meaningful verbal contact was maintained at all times during the procedure  Local Anesthetic: Lidocaine 1-2%  Position: Prone   Indications: 1. Lumbar facet syndrome (Bilateral) (R>L)   2. Spondylosis without myelopathy or radiculopathy, lumbosacral region   3. Lumbar (6 mm) Anterolisthesis of L4/L5   4. DDD (degenerative disc disease), lumbosacral   5. Chronic low back pain (Primary Area of Pain) (Bilateral) (R>L) w/o sciatica    Pain Score: Pre-procedure: 10-Worst pain ever/10 Post-procedure: 0-No pain/10   Pre-op Assessment:  Megan Perez is a 73 y.o. (year old), female patient, seen today for interventional treatment. She  has a past surgical history that includes Abdominal hysterectomy; Cholecystectomy; Replacement total knee; Heel spur surgery; Shoulder surgery (Left); Shoulder arthroscopy with open rotator cuff repair (Right, 09/15/2015); Replacement total knee (Right); Carpal tunnel release (Right); and Colonoscopy with propofol (N/A,  06/19/2017). Megan Perez has a current medication list which includes the following prescription(s): acetaminophen, aspirin ec, azelastine, b-d assure bpm/auto wrist cuff, diclofenac sodium, furosemide, lisinopril, sucralfate, tizanidine, and calcium carbonate, and the following Facility-Administered Medications: fentanyl and midazolam. Her primarily concern today is the Back Pain  Initial Vital Signs:  Pulse/HCG Rate: 72ECG Heart Rate: 79 Temp: 98.3 F (36.8 C) Resp: 18 BP: 134/83 SpO2: 100 %  BMI: Estimated body mass index is 41.97 kg/m as calculated from the following:   Height as of this encounter:  (1.702 m).   Weight as of this encounter: 268 lb (121.6 kg).  Risk Assessment: Allergies: Reviewed. She is allergic to baclofen; meclizine; other; penicillins; prednisone; prevacid [lansoprazole]; gabapentin; and oxycodone.  Allergy Precautions: None required Coagulopathies: Reviewed. None identified.  Blood-thinner therapy: None at this time Active Infection(s): Reviewed. None identified. Megan Perez is afebrile  Site Confirmation: Megan Perez was asked to confirm the procedure and laterality before marking the site Procedure checklist: Completed Consent: Before the procedure and under the influence of no sedative(s), amnesic(s), or anxiolytics, the patient was informed of the treatment options, risks and possible complications. To fulfill our ethical and legal obligations, as recommended by the American Medical Association's Code of Ethics, I have informed the patient of my clinical impression; the nature and purpose of the treatment or procedure; the risks, benefits, and possible complications of the intervention; the alternatives, including doing nothing; the risk(s) and benefit(s) of the alternative treatment(s) or procedure(s); and the risk(s) and benefit(s) of doing nothing. The patient was provided information about the general risks and possible complications associated with the  procedure. These may include, but are not limited to: failure to achieve desired goals, infection, bleeding, organ or nerve damage, allergic reactions, paralysis, and death. In addition, the patient  was informed of those risks and complications associated to Spine-related procedures, such as failure to decrease pain; infection (i.e.: Meningitis, epidural or intraspinal abscess); bleeding (i.e.: epidural hematoma, subarachnoid hemorrhage, or any other type of intraspinal or peri-dural bleeding); organ or nerve damage (i.e.: Any type of peripheral nerve, nerve root, or spinal cord injury) with subsequent damage to sensory, motor, and/or autonomic systems, resulting in permanent pain, numbness, and/or weakness of one or several areas of the body; allergic reactions; (i.e.: anaphylactic reaction); and/or death. Furthermore, the patient was informed of those risks and complications associated with the medications. These include, but are not limited to: allergic reactions (i.e.: anaphylactic or anaphylactoid reaction(s)); adrenal axis suppression; blood sugar elevation that in diabetics may result in ketoacidosis or comma; water retention that in patients with history of congestive heart failure may result in shortness of breath, pulmonary edema, and decompensation with resultant heart failure; weight gain; swelling or edema; medication-induced neural toxicity; particulate matter embolism and blood vessel occlusion with resultant organ, and/or nervous system infarction; and/or aseptic necrosis of one or more joints. Finally, the patient was informed that Medicine is not an exact science; therefore, there is also the possibility of unforeseen or unpredictable risks and/or possible complications that may result in a catastrophic outcome. The patient indicated having understood very clearly. We have given the patient no guarantees and we have made no promises. Enough time was given to the patient to ask questions, all of  which were answered to the patient's satisfaction. Megan Perez has indicated that she wanted to continue with the procedure. Attestation: I, the ordering provider, attest that I have discussed with the patient the benefits, risks, side-effects, alternatives, likelihood of achieving goals, and potential problems during recovery for the procedure that I have provided informed consent. Date   Time: 06/12/2019  8:19 AM  Pre-Procedure Preparation:  Monitoring: As per clinic protocol. Respiration, ETCO2, SpO2, BP, heart rate and rhythm monitor placed and checked for adequate function Safety Precautions: Patient was assessed for positional comfort and pressure points before starting the procedure. Time-out: I initiated and conducted the "Time-out" before starting the procedure, as per protocol. The patient was asked to participate by confirming the accuracy of the "Time Out" information. Verification of the correct person, site, and procedure were performed and confirmed by me, the nursing staff, and the patient. "Time-out" conducted as per Joint Commission's Universal Protocol (UP.01.01.01). Time: 2130  Description of Procedure:          Laterality: Bilateral. The procedure was performed in identical fashion on both sides. Levels:  L2, L3, L4, L5, & S1 Medial Branch Level(s) Area Prepped: Posterior Lumbosacral Region Prepping solution: DuraPrep (Iodine Povacrylex [0.7% available iodine] and Isopropyl Alcohol, 74% w/w) Safety Precautions: Aspiration looking for blood return was conducted prior to all injections. At no point did we inject any substances, as a needle was being advanced. Before injecting, the patient was told to immediately notify me if she was experiencing any new onset of "ringing in the ears, or metallic taste in the mouth". No attempts were made at seeking any paresthesias. Safe injection practices and needle disposal techniques used. Medications properly checked for expiration dates. SDV  (single dose vial) medications used. After the completion of the procedure, all disposable equipment used was discarded in the proper designated medical waste containers. Local Anesthesia: Protocol guidelines were followed. The patient was positioned over the fluoroscopy table. The area was prepped in the usual manner. The time-out was completed. The target area was identified  using fluoroscopy. A 12-in long, straight, sterile hemostat was used with fluoroscopic guidance to locate the targets for each level blocked. Once located, the skin was marked with an approved surgical skin marker. Once all sites were marked, the skin (epidermis, dermis, and hypodermis), as well as deeper tissues (fat, connective tissue and muscle) were infiltrated with a small amount of a short-acting local anesthetic, loaded on a 10cc syringe with a 25G, 1.5-in  Needle. An appropriate amount of time was allowed for local anesthetics to take effect before proceeding to the next step. Local Anesthetic: Lidocaine 2.0% The unused portion of the local anesthetic was discarded in the proper designated containers. Technical explanation of process:  L2 Medial Branch Nerve Block (MBB): The target area for the L2 medial branch is at the junction of the postero-lateral aspect of the superior articular process and the superior, posterior, and medial edge of the transverse process of L3. Under fluoroscopic guidance, a Quincke needle was inserted until contact was made with os over the superior postero-lateral aspect of the pedicular shadow (target area). After negative aspiration for blood, 0.5 mL of the nerve block solution was injected without difficulty or complication. The needle was removed intact. L3 Medial Branch Nerve Block (MBB): The target area for the L3 medial branch is at the junction of the postero-lateral aspect of the superior articular process and the superior, posterior, and medial edge of the transverse process of L4. Under  fluoroscopic guidance, a Quincke needle was inserted until contact was made with os over the superior postero-lateral aspect of the pedicular shadow (target area). After negative aspiration for blood, 0.5 mL of the nerve block solution was injected without difficulty or complication. The needle was removed intact. L4 Medial Branch Nerve Block (MBB): The target area for the L4 medial branch is at the junction of the postero-lateral aspect of the superior articular process and the superior, posterior, and medial edge of the transverse process of L5. Under fluoroscopic guidance, a Quincke needle was inserted until contact was made with os over the superior postero-lateral aspect of the pedicular shadow (target area). After negative aspiration for blood, 0.5 mL of the nerve block solution was injected without difficulty or complication. The needle was removed intact. L5 Medial Branch Nerve Block (MBB): The target area for the L5 medial branch is at the junction of the postero-lateral aspect of the superior articular process and the superior, posterior, and medial edge of the sacral ala. Under fluoroscopic guidance, a Quincke needle was inserted until contact was made with os over the superior postero-lateral aspect of the pedicular shadow (target area). After negative aspiration for blood, 0.5 mL of the nerve block solution was injected without difficulty or complication. The needle was removed intact. S1 Medial Branch Nerve Block (MBB): The target area for the S1 medial branch is at the posterior and inferior 6 o'clock position of the L5-S1 facet joint. Under fluoroscopic guidance, the Quincke needle inserted for the L5 MBB was redirected until contact was made with os over the inferior and postero aspect of the sacrum, at the 6 o' clock position under the L5-S1 facet joint (Target area). After negative aspiration for blood, 0.5 mL of the nerve block solution was injected without difficulty or complication. The  needle was removed intact.  Nerve block solution: 0.2% PF-Ropivacaine + Triamcinolone (40 mg/mL) diluted to a final concentration of 4 mg of Triamcinolone/mL of Ropivacaine The unused portion of the solution was discarded in the proper designated containers. Procedural Needles: 22-gauge,  3.5-inch, Quincke needles used for all levels.  Once the entire procedure was completed, the treated area was cleaned, making sure to leave some of the prepping solution back to take advantage of its long term bactericidal properties.   Illustration of the posterior view of the lumbar spine and the posterior neural structures. Laminae of L2 through S1 are labeled. DPRL5, dorsal primary ramus of L5; DPRS1, dorsal primary ramus of S1; DPR3, dorsal primary ramus of L3; FJ, facet (zygapophyseal) joint L3-L4; I, inferior articular process of L4; LB1, lateral branch of dorsal primary ramus of L1; IAB, inferior articular branches from L3 medial branch (supplies L4-L5 facet joint); IBP, intermediate branch plexus; MB3, medial branch of dorsal primary ramus of L3; NR3, third lumbar nerve root; S, superior articular process of L5; SAB, superior articular branches from L4 (supplies L4-5 facet joint also); TP3, transverse process of L3.  Vitals:   06/12/19 0912 06/12/19 0922 06/12/19 0932 06/12/19 0942  BP: 122/69 125/82 (!) 152/76 (!) 150/70  Pulse:      Resp: Temp:  97.7 F (36.5 C)  (!) 97.2 F (36.2 C)  SpO2: 97% 98% 98% 98%  Weight:      Height:         Start Time: 0854 hrs. End Time: 0911 hrs.  Imaging Guidance (Spinal):          Type of Imaging Technique: Fluoroscopy Guidance (Spinal) Indication(s): Assistance in needle guidance and placement for procedures requiring needle placement in or near specific anatomical locations not easily accessible without such assistance. Exposure Time: Please see nurses notes. Contrast: None used. Fluoroscopic Guidance: I was personally present during the use of  fluoroscopy. "Tunnel Vision Technique" used to obtain the best possible view of the target area. Parallax error corrected before commencing the procedure. "Direction-depth-direction" technique used to introduce the needle under continuous pulsed fluoroscopy. Once target was reached, antero-posterior, oblique, and lateral fluoroscopic projection used confirm needle placement in all planes. Images permanently stored in EMR. Interpretation: No contrast injected. I personally interpreted the imaging intraoperatively. Adequate needle placement confirmed in multiple planes. Permanent images saved into the patient's record.  Antibiotic Prophylaxis:   Anti-infectives (From admission, onward)   None     Indication(s): None identified  Post-operative Assessment:  Post-procedure Vital Signs:  Pulse/HCG Rate: 7268 Temp: (!) 97.2 F (36.2 C) Resp: 16 BP: (!) 150/70 SpO2: 98 %  EBL: None  Complications: No immediate post-treatment complications observed by team, or reported by patient.  Note: The patient tolerated the entire procedure well. A repeat set of vitals were taken after the procedure and the patient was kept under observation following institutional policy, for this type of procedure. Post-procedural neurological assessment was performed, showing return to baseline, prior to discharge. The patient was provided with post-procedure discharge instructions, including a section on how to identify potential problems. Should any problems arise concerning this procedure, the patient was given instructions to immediately contact us, at any time, without hesitation. In any case, we plan to contact the patient by telephone for a follow-up status report regarding this interventional procedure.  Comments:  No additional relevant information.  Plan of Care  Orders:  Orders Placed This Encounter  Procedures   LUMBAR FACET(MEDIAL BRANCH NERVE BLOCK) MBNB    Scheduling Instructions:     Side: Bilateral      Level: L3-4, L4-5, & L5-S1 Facets (L2, L3, L4, L5, & S1 Medial Branch Nerves)     Sedation: With Sedation.  Timeframe: Today    Order Specific Question:   Where will this procedure be performed?    Answer:   [facility]   DG PAIN [facility] C-ARM 1-60 MIN NO REPORT    Intraoperative interpretation by procedural physician at [facility].    Standing Status:   Standing    Number of Occurrences:   1    Order Specific Question:   Reason for exam:    Answer:   Assistance in needle guidance and placement for procedures requiring needle placement in or near specific anatomical locations not easily accessible without such assistance.   Informed Consent Details: Physician/Practitioner Attestation; Transcribe to consent form and obtain patient signature    Surgeon/Provider: Delynda Sepulveda A. Laban Emperor, MD (Pain Management Specialist) Attestation: I, Tremont Gavitt A. Laban Emperor, MD, the physician/practitioner, attest that I have discussed with the patient the benefits, risks, side effects, alternatives, likelihood of achieving goals and potential problems during recovery for the procedure that I have provided informed consent.    Scheduling Instructions:     Procedure: Lumbar Facet Block  under fluoroscopic guidance     Indication(s): Low Back Pain, with our without leg pain, due to Facet Joint Arthralgia (Joint Pain) known as Lumbar Facet Syndrome, secondary to Lumbar, and/or Lumbosacral Spondylosis (Arthritis of the Spine), without myelopathy or radiculopathy (Nerve Damage).     Note: Always confirm laterality of pain with Ms. Mcmanigal, before procedure.   Chronic Opioid Analgesic:  No opioid analgesics from our practice.   Medications ordered for procedure: Meds ordered this encounter  Medications   lidocaine (XYLOCAINE) 2 % (with pres) injection 400 mg   lactated ringers infusion 1,000 mL   midazolam (VERSED) 5 MG/5ML injection 1-2 mg    Make sure Flumazenil is available in the pyxis  when using this medication. If oversedation occurs, administer 0.2 mg IV over 15 sec. If after 45 sec no response, administer 0.2 mg again over 1 min; may repeat at 1 min intervals; not to exceed 4 doses (1 mg)   fentaNYL (SUBLIMAZE) injection 25-50 mcg    Make sure Narcan is available in the pyxis when using this medication. In the event of respiratory depression (RR< 8/min): Titrate NARCAN (naloxone) in increments of 0.1 to 0.2 mg IV at 2-3 minute intervals, until desired degree of reversal.   ropivacaine (PF) 2 mg/mL (0.2%) (NAROPIN) injection 18 mL   triamcinolone acetonide (KENALOG-40) injection 80 mg   Medications administered: We administered lidocaine, lactated ringers, midazolam, fentaNYL, ropivacaine (PF) 2 mg/mL (0.2%), and triamcinolone acetonide.  See the medical record for exact dosing, route, and time of administration.  Follow-up plan:   Return in about 2 weeks (around 06/26/2019) for (VV), (PP).       Considering:   Possible bilateral lumbar facet RFA  Diagnostic bilateral sacroiliac joint blocks  Possible bilateral sacroiliac joint RFA  Diagnostic right-sided L4 transforaminal LESI  Diagnostic right-sided L3-4 LESI  Diagnostic bilateral genicular nerve blocks  Possible bilateral genicular nerve RFA    PRN Procedures:   Diagnostic bilateral lumbar facet block #3     Recent Visits Date Type Provider Dept  03/19/19 Office Visit Delano Metz, MD [facility]  Showing recent visits within past 90 days and meeting all other requirements   Today's Visits Date Type Provider Dept  06/12/19 Procedure visit Delano Metz, MD [facility]  Showing today's visits and meeting all other requirements   Future Appointments Date Type Provider Dept  07/07/19 Appointment Delano Metz, MD [facility]  Showing future appointments within next 90 days and meeting all other requirements   Disposition: Discharge home  Discharge Date &  Time: 06/12/2019; 0943 hrs.   Primary Care Physician: Mikey College, NP Location: [facility] Note by: Gaspar Cola, MD Date: 06/12/2019; Time: 10:01 AM  Disclaimer:  Medicine is not an Chief Strategy Officer. The only guarantee in medicine is that nothing is guaranteed. It is important to note that the decision to proceed with this intervention was based on the information collected from the patient. The Data and conclusions were drawn from the patient's questionnaire, the interview, and the physical examination. Because the information was provided in large part by the patient, it cannot be guaranteed that it has not been purposely or unconsciously manipulated. Every effort has been made to obtain as much relevant data as possible for this evaluation. It is important to note that the conclusions that lead to this procedure are derived in large part from the available data. Always take into account that the treatment will also be dependent on availability of resources and existing treatment guidelines, considered by other Pain Management Practitioners as being common knowledge and practice, at the time of the intervention. For Medico-Legal purposes, it is also important to point out that variation in procedural techniques and pharmacological choices are the acceptable norm. The indications, contraindications, technique, and results of the above procedure should only be interpreted and judged by a Board-Certified Interventional Pain Specialist with extensive familiarity and expertise in the same exact procedure and technique.

## 2019-06-13 ENCOUNTER — Telehealth: Payer: Self-pay

## 2019-06-13 NOTE — Telephone Encounter (Signed)
Post procedure phone call. Patient states she has a slight headache and some tingling just on the top of her foot.  Instructed patient to do what she normally did for a headache and to call us if her headache would not subside or if the tingling started progressing up her legs or did not subside in a couple of days or if she had any questions or concerns at all.

## 2019-06-17 ENCOUNTER — Telehealth: Payer: Self-pay | Admitting: Pain Medicine

## 2019-06-17 NOTE — Telephone Encounter (Signed)
Patient advised that it is common to experience these symptoms following a facet block. Instructed to document these symptoms on Pain Diary to discuss with Dr. Dossie Arbour at follow-up call. Advised to call us if fever, loss of leg control.

## 2019-06-17 NOTE — Telephone Encounter (Signed)
Called stating she has tingling in right leg and up to socket in right hip. She just had procedure on 24th. Please call to discuss pain and why its back so suddenly.

## 2019-06-30 ENCOUNTER — Ambulatory Visit: Payer: Self-pay | Admitting: *Deleted

## 2019-06-30 ENCOUNTER — Telehealth: Payer: Medicare HMO

## 2019-06-30 DIAGNOSIS — I10 Essential (primary) hypertension: Secondary | ICD-10-CM

## 2019-06-30 NOTE — Chronic Care Management (AMB) (Signed)
  Chronic Care Management   Outreach Note  06/30/2019 Name: Megan Perez MRN: [id number] DOB: Jun 29, 1946  Referred by: Mikey College, NP Reason for referral : Chronic Care Management (Unsuccessful Outreach x1)   An unsuccessful telephone outreach was attempted today. The patient was referred to the case management team by for assistance with care management and care coordination.   Follow Up Plan: A HIPPA compliant phone message was left for the patient providing contact information and requesting a return call.  The care management team will reach out to the patient again over the next 30 days.   Merlene Morse Lashawnta Burgert RN, BSN Nurse Case Pharmacist, [facility]  [phone]) Business Mobile

## 2019-07-03 ENCOUNTER — Encounter: Payer: Self-pay | Admitting: Pain Medicine

## 2019-07-06 NOTE — Progress Notes (Signed)
Pain Management Virtual Encounter Note - Virtual Visit via Telephone Telehealth (real-time audio visits between healthcare provider and patient).   Patient's Phone No. & Preferred Pharmacy:  [phone] (home); [phone] (mobile); (Preferred) [phone] [email or web address]  [facility] [phone], [place] [address] [place] [address] Phone: [phone] Fax: [phone]    Pre-screening note:  Our staff contacted Ms. Gonzales and offered her an "in person", "face-to-face" appointment versus a telephone encounter. She indicated preferring the telephone encounter, at this time.   Reason for Virtual Visit: COVID-19*   Social distancing based on CDC and AMA recommendations.   I contacted Naida Sleight on 07/07/2019 via telephone.      I clearly identified myself as Gaspar Cola, MD. I verified that I was speaking with the correct person using two identifiers (Name: LEISA GAULT, and date of birth: 1946-07-19).  Advanced Informed Consent I sought verbal advanced consent from Naida Sleight for virtual visit interactions. I informed Ms. Brinkmeyer of possible security and privacy concerns, risks, and limitations associated with providing "not-in-person" medical evaluation and management services. I also informed Ms. Devito of the availability of "in-person" appointments. Finally, I informed her that there would be a charge for the virtual visit and that she could be  personally, fully or partially, financially responsible for it. Ms. Bateson expressed understanding and agreed to proceed.   Historic Elements   Ms. JARELLY RINCK is a 73 y.o. year old, female patient evaluated today after her last encounter by our practice on 06/17/2019. Ms. Yow  has a past medical history of Anxiety, Asthma, Bronchitis, GERD (gastroesophageal reflux disease), Hypertension, Morbid obesity with BMI of 40.0-44.9, adult (Green) (04/08/2012),  OA (osteoarthritis), Osteoporosis, Prediabetes, Sinus infection, and Venous (peripheral) insufficiency (08/19/2010). She also  has a past surgical history that includes Abdominal hysterectomy; Cholecystectomy; Replacement total knee; Heel spur surgery; Shoulder surgery (Left); Shoulder arthroscopy with open rotator cuff repair (Right, 09/15/2015); Replacement total knee (Right); Carpal tunnel release (Right); and Colonoscopy with propofol (N/A, 06/19/2017). Ms. Billingham has a current medication list which includes the following prescription(s): acetaminophen, aspirin ec, azelastine, b-d assure bpm/auto wrist cuff, diclofenac sodium, furosemide, lisinopril, sucralfate, calcium carbonate, pregabalin, and tizanidine. She  reports that she has never smoked. She has never used smokeless tobacco. She reports that she does not drink alcohol or use drugs. Ms. Greenhouse is allergic to baclofen; meclizine; other; penicillins; prednisone; prevacid [lansoprazole]; gabapentin; and oxycodone.   HPI  Today, she is being contacted for both, medication management and a post-procedure assessment.  The patient indicated having attained 100% relief of the pain for the duration of the local anesthetic and in fact, this 100% relief lasted for approximately 3 days.  Unfortunately, the pain on the right side and right lower extremity has returned.  She indicates having difficulty standing for prolonged periods of time and particularly while she is at church listening to the scriptures.  In any case, it is important to know that the patient refers 100% relief on the left side and left lower extremity where she is not experiencing any symptoms at this time.  However, she still having right-sided low back pain with prolonged standing and right lower extremity pain that she describes as a shooting burning sensation to the top of the foot (L5).  However, she also indicates that during the time that the numbing medicine was working, after the  diagnostic bilateral  lumbar facet block, she was not experiencing any lower extremity pain suggesting that the lower extremity pain is "referred" from the lumbar facets, suggesting a lumbar facet syndrome.  She indicates having tried some Neurontin in the past, but it made her very sleepy and she discontinued it.  This was many years ago.  Today we spoke about doing a trial with Lyrica.  I have explained to the patient about the medication, how to take it, and how it takes 2 to 3 weeks to get into the system.  We will be starting her at 25 mg p.o. at bedtime and after 2 weeks will go to twice daily.  I have also informed the patient that should she begin to experience somnolence with the day pill, she can then move it to at bedtime and simply take 50 mg at bedtime.  The patient also indicated that she was recently started on Zanaflex 4 mg, which she attempted to take twice daily, but was unable to do that due to being too sleepy with the full pill.  She then went on to taking half of a pill during the day, only when she needs it, she indicates that this seems to work.  At bedtime she still takes the full 4 mg, and indicates doing well with that.  The rest of the time, she treats her pain with Tylenol.  Based on the results of her 2 diagnostic lumbar facet blocks, I believe that she would be a great candidate for radiofrequency ablation of the lumbar facets.  Today I have talked to the patient about this option and I have explained to her what it entails.  She indicates that she would be okay to give it a try.  At this point, since the only side that hurts is the right side, we would be considering radiofrequency ablation only on the right side.  Post-Procedure Evaluation  Procedure: Diagnostic bilateral lumbar facet block #2 under fluoroscopic guidance and IV sedation Pre-procedure pain level:  10/10 Post-procedure: 0/10 (100% relief)  Sedation: Please see nurses note.  Effectiveness during initial  hour after procedure(Ultra-Short Term Relief): 100 %   Local anesthetic used: Long-acting (4-6 hours) Effectiveness: Defined as any analgesic benefit obtained secondary to the administration of local anesthetics. This carries significant diagnostic value as to the etiological location, or anatomical origin, of the pain. Duration of benefit is expected to coincide with the duration of the local anesthetic used.  Effectiveness during initial 4-6 hours after procedure(Short-Term Relief): 100 %   Long-term benefit: Defined as any relief past the pharmacologic duration of the local anesthetics.  Effectiveness past the initial 6 hours after procedure(Long-Term Relief): 100 % (lasted for 3 days)   Current benefits: Defined as benefit that persist at this time.   Analgesia:  90-100% better on the left side.  The pain on the right side is now intermittent for the lower back, but still present in the right lower extremity. Function: Somewhat improved.  Much more significant improvement seen on the left side. ROM: Somewhat improved.  Much more improvement seen on the left side.   Medical Necessity: Ms. Giddens has experienced debilitating chronic pain from the Lumbosacral Facet Syndrome (Spondylosis without myelopathy or radiculopathy, lumbosacral region [M47.817]) that has persisted for longer than three months of failed non-surgical care and has either failed to respond, or was unable to tolerate, or simply did not get enough benefit from other more conservative therapies including, but not limited to: 1. Over-the-counter oral analgesic medications (i.e.:  ibuprofen, naproxen, etc.) 2. Anti-inflammatory medications 3. Muscle relaxants 4. Membrane stabilizers 5. Opioids 6. Physical therapy (PT), chiropractic manipulation, and/or home exercise program (HEP). 7. Modalities (Heat, ice, etc.) 8. Invasive techniques such as nerve blocks.  Ms. Petrea has attained greater than 50% reduction in pain from at  least two (2) diagnostic medial branch blocks conducted in separate occasions. For this reason, I believe it is medically necessary to proceed with Non-Pulsed Radiofrequency Ablation for the purpose of attempting to prolong the duration of the benefits seen with the diagnostic injections.  Pharmacotherapy Assessment  Analgesic: No opioid analgesics from our practice.   Monitoring: Pharmacotherapy: No side-effects or adverse reactions reported. [place] PMP: PDMP reviewed during this encounter.       Compliance: No problems identified. Effectiveness: Clinically acceptable. Plan: Refer to "POC".  UDS:  Summary  Date Value Ref Range Status  11/05/2018 FINAL  Final    Comment:    ==================================================================== [facility] COMP DRUG ANALYSIS,UR ==================================================================== Test                             Result       Flag       Units Drug Present and Declared for Prescription Verification   Acetaminophen                  PRESENT      EXPECTED Drug Absent but Declared for Prescription Verification   Baclofen                       Not Detected UNEXPECTED   Diclofenac                     Not Detected UNEXPECTED    Topical diclofenac, as indicated in the declared medication list,    is not always detected even when used as directed.   Salicylate                     Not Detected UNEXPECTED    Aspirin, as indicated in the declared medication list, is not    always detected even when used as directed. ==================================================================== Test                      Result    Flag   Units      Ref Range   Creatinine              93               mg/dL      >=16 ==================================================================== Declared Medications:  The flagging and interpretation on this report are based on the  following declared medications.  Unexpected results may arise from   inaccuracies in the declared medications.  **Note: The testing scope of this panel includes these medications:  Baclofen  **Note: The testing scope of this panel does not include small to  moderate amounts of these reported medications:  Acetaminophen  Aspirin (Aspirin 81)  Topical Diclofenac  **Note: The testing scope of this panel does not include following  reported medications:  Azelastine  Furosemide  Lisinopril  Pantoprazole  Potassium  Pravastatin  Sucralfate ==================================================================== For clinical consultation, please call [phone]. ====================================================================    Laboratory Chemistry Profile (12 mo)  Renal: 05/28/2019: BUN 19; BUN/Creatinine Ratio 18; Creat 1.03  Lab Results  Component Value Date   GFRAA 62 05/28/2019   [facility]  54 (L) 05/28/2019   Hepatic: No results found for requested labs within last 8760 hours. Lab Results  Component Value Date   AST 11 05/28/2019   ALT 8 05/28/2019   Other: 11/05/2018: 25-Hydroxy, Vitamin D 13; 25-Hydroxy, Vitamin D-2 <1.0; 25-Hydroxy, Vitamin D-3 13; CRP 11; Sed Rate 30; Vitamin B-12 468 Note: Above Lab results reviewed.  Imaging  Last 90 days:  [facility] C-arm 1-60 Min No Report  Result Date: 06/12/2019 Fluoro was used, but no Radiologist interpretation will be provided. Please refer to "NOTES" tab for provider progress note.      Assessment  The primary encounter diagnosis was Chronic low back pain (Primary Area of Pain) (Bilateral) (R>L) w/o sciatica. Diagnoses of Chronic lower extremity pain (Secondary Area of Pain) (Right), Lumbar facet syndrome (Bilateral) (R>L), Chronic musculoskeletal pain, Chronic peripheral neuropathic pain, Neurogenic pain, and Vitamin D deficiency were also pertinent to this visit.  Plan of Care  I have discontinued Neeti A. Reddix's tiZANidine. I have also changed her calcium carbonate. Additionally,  I am having her start on pregabalin and tiZANidine. Lastly, I am having her maintain her aspirin EC, acetaminophen, azelastine, diclofenac sodium, furosemide, sucralfate, B-D ASSURE BPM/AUTO WRIST CUFF, and lisinopril.  Pharmacotherapy (Medications Ordered): Meds ordered this encounter  Medications   calcium carbonate (CALCIUM 600) 600 MG TABS tablet    Sig: Take 1 tablet (600 mg total) by mouth daily.    Dispense:  90 tablet    Refill:  3    Do not place medication on "Automatic Refill". Fill one day early if pharmacy is closed on scheduled refill date.   pregabalin (LYRICA) 25 MG capsule    Sig: Take 1 capsule (25 mg total) by mouth at bedtime for 14 days, THEN 1 capsule (25 mg total) 2 (two) times daily for 16 days.    Dispense:  46 capsule    Refill:  0    Fill one day early if pharmacy is closed on scheduled refill date. May substitute for generic if available.  Should take the time he will make the patient sleepy, then switch to taking it 2 tablets at bedtime.   tiZANidine (ZANAFLEX) 2 MG tablet    Sig: Take 2 tablets (4 mg total) by mouth at bedtime. May also take 1 tablet (2 mg total) daily as needed for muscle spasms.    Dispense:  90 tablet    Refill:  0    Fill one day early if pharmacy is closed on scheduled refill date. May substitute for generic if available.   Orders:  Orders Placed This Encounter  Procedures   Radiofrequency,Lumbar    Standing Status:   Future    Standing Expiration Date:   01/04/2021    Scheduling Instructions:     Side(s): Right-sided     Level: L3-4, L4-5, & L5-S1 Facets (L2, L3, L4, L5, & S1 Medial Branch Nerves)     Sedation: With Sedation     Scheduling Timeframe: As soon as pre-approved    Order Specific Question:   Where will this procedure be performed?    Answer:   [facility]   Follow-up plan:   Return for RFA (w/ sedation): (R) L-FCT RFA #1.      Considering:   Possible bilateral lumbar facet RFA  Diagnostic bilateral  sacroiliac joint blocks  Possible bilateral sacroiliac joint RFA  Diagnostic right-sided L4 transforaminal LESI  Diagnostic right-sided L3-4 LESI  Diagnostic bilateral genicular nerve blocks  Possible bilateral genicular  nerve RFA    PRN Procedures:   Diagnostic bilateral lumbar facet block #3      Recent Visits Date Type Provider Dept  06/12/19 Procedure visit Delano Metz, MD [facility]  Showing recent visits within past 90 days and meeting all other requirements   Today's Visits Date Type Provider Dept  07/07/19 Office Visit Delano Metz, MD [facility]  Showing today's visits and meeting all other requirements   Future Appointments No visits were found meeting these conditions.  Showing future appointments within next 90 days and meeting all other requirements   I discussed the assessment and treatment plan with the patient. The patient was provided an opportunity to ask questions and all were answered. The patient agreed with the plan and demonstrated an understanding of the instructions.  Patient advised to call back or seek an in-person evaluation if the symptoms or condition worsens.  Total duration of non-face-to-face encounter: 23 minutes.  Note by: Oswaldo Done, MD Date: 07/07/2019; Time: 9:31 AM  Note: This dictation was prepared with Dragon dictation. Any transcriptional errors that may result from this process are unintentional.  Disclaimer:  * Given the special circumstances of the COVID-19 pandemic, the federal government has announced that the Office for Civil Rights (OCR) will exercise its enforcement discretion and will not impose penalties on physicians using telehealth in the event of noncompliance with regulatory requirements under the [employer] Portability and Accountability Act (HIPAA) in connection with the good faith provision of telehealth during the COVID-19 national public health emergency. (AMA)

## 2019-07-07 ENCOUNTER — Other Ambulatory Visit: Payer: Self-pay

## 2019-07-07 ENCOUNTER — Ambulatory Visit: Payer: Medicare HMO | Attending: Pain Medicine | Admitting: Pain Medicine

## 2019-07-07 DIAGNOSIS — M792 Neuralgia and neuritis, unspecified: Secondary | ICD-10-CM | POA: Diagnosis not present

## 2019-07-07 DIAGNOSIS — M79604 Pain in right leg: Secondary | ICD-10-CM | POA: Diagnosis not present

## 2019-07-07 DIAGNOSIS — E559 Vitamin D deficiency, unspecified: Secondary | ICD-10-CM | POA: Diagnosis not present

## 2019-07-07 DIAGNOSIS — G8929 Other chronic pain: Secondary | ICD-10-CM | POA: Insufficient documentation

## 2019-07-07 DIAGNOSIS — M7918 Myalgia, other site: Secondary | ICD-10-CM | POA: Insufficient documentation

## 2019-07-07 DIAGNOSIS — M545 Low back pain: Secondary | ICD-10-CM

## 2019-07-07 DIAGNOSIS — M47816 Spondylosis without myelopathy or radiculopathy, lumbar region: Secondary | ICD-10-CM

## 2019-07-07 DIAGNOSIS — M5416 Radiculopathy, lumbar region: Secondary | ICD-10-CM | POA: Insufficient documentation

## 2019-07-07 MED ORDER — PREGABALIN 25 MG PO CAPS: ORAL_CAPSULE | ORAL | 0 refills | Status: DC

## 2019-07-07 MED ORDER — CALCIUM CARBONATE 600 MG PO TABS: 600.0000 mg | ORAL_TABLET | Freq: Every day | ORAL | 3 refills | Status: DC

## 2019-07-07 MED ORDER — TIZANIDINE HCL 2 MG PO TABS: ORAL_TABLET | ORAL | 0 refills | Status: DC

## 2019-07-07 NOTE — Patient Instructions (Signed)
Lyrica (pregabalin) instructions: Start by taking 1 capsule (25 mg) at bedtime for 2 weeks.  After that, then increase the dose to 1 capsule twice a day for another 2 weeks.  Should daytime does make you sleepy, then switch it to taking 2 capsules (50 mg) at bedtime instead of 1 capsule twice a day.  Remember that it takes 2 to 3 weeks of taking the medication regularly for adequate blood levels to develop.  ____________________________________________________________________________________________  Preparing for Procedure with Sedation  Procedure appointments are limited to planned procedures: . No Prescription Refills. . No disability issues will be discussed. . No medication changes will be discussed.  Instructions: . Oral Intake: Do not eat or drink anything for at least 8 hours prior to your procedure. . Transportation: Public transportation is not allowed. Bring an adult driver. The driver must be physically present in our waiting room before any procedure can be started. [name] Physical Assistance: Bring an adult physically capable of assisting you, in the event you need help. This adult should keep you company at home for at least 6 hours after the procedure. . Blood Pressure Medicine: Take your blood pressure medicine with a sip of water the morning of the procedure. . Blood thinners: Notify our staff if you are taking any blood thinners. Depending on which one you take, there will be specific instructions on how and when to stop it. . Diabetics on insulin: Notify the staff so that you can be scheduled 1st case in the morning. If your diabetes requires high dose insulin, take only  of your normal insulin dose the morning of the procedure and notify the staff that you have done so. . Preventing infections: Shower with an antibacterial soap the morning of your procedure. . Build-up your immune system: Take 1000 mg of Vitamin C with every meal (3 times a day) the day prior to your  procedure. [name] Antibiotics: Inform the staff if you have a condition or reason that requires you to take antibiotics before dental procedures. . Pregnancy: If you are pregnant, call and cancel the procedure. . Sickness: If you have a cold, fever, or any active infections, call and cancel the procedure. . Arrival: You must be in the facility at least 30 minutes prior to your scheduled procedure. . Children: Do not bring children with you. . Dress appropriately: Bring dark clothing that you would not mind if they get stained. . Valuables: Do not bring any jewelry or valuables.  Reasons to call and reschedule or cancel your procedure: (Following these recommendations will minimize the risk of a serious complication.) . Surgeries: Avoid having procedures within 2 weeks of any surgery. (Avoid for 2 weeks before or after any surgery). . Flu Shots: Avoid having procedures within 2 weeks of a flu shots or . (Avoid for 2 weeks before or after immunizations). . Barium: Avoid having a procedure within 7-10 days after having had a radiological study involving the use of radiological contrast. (Myelograms, Barium swallow or enema study). . Heart attacks: Avoid any elective procedures or surgeries for the initial 6 months after a "Myocardial Infarction" (Heart Attack). . Blood thinners: It is imperative that you stop these medications before procedures. Let us know if you if you take any blood thinner.  . Infection: Avoid procedures during or within two weeks of an infection (including chest colds or gastrointestinal problems). Symptoms associated with infections include: Localized redness, fever, chills, night sweats or profuse sweating, burning sensation when voiding, cough, congestion, stuffiness, runny  nose, sore throat, diarrhea, nausea, vomiting, cold or Flu symptoms, recent or current infections. It is specially important if the infection is over the area that we intend to treat. [name] Heart and lung problems:  Symptoms that may suggest an active cardiopulmonary problem include: cough, chest pain, breathing difficulties or shortness of breath, dizziness, ankle swelling, uncontrolled high or unusually low blood pressure, and/or palpitations. If you are experiencing any of these symptoms, cancel your procedure and contact your primary care physician for an evaluation.  Remember:  Regular Business hours are:  Monday to Thursday 8:00 AM to 4:00 PM  Provider's Schedule: Delano Metz, MD:  Procedure days: Tuesday and Thursday 7:30 AM to 4:00 PM  Edward Jolly, MD:  Procedure days: Monday and Wednesday 7:30 AM to 4:00 PM ____________________________________________________________________________________________

## 2019-07-27 IMAGING — CR LUMBAR SPINE - COMPLETE WITH BENDING VIEWS
1 series · 8 of 8 positions shown · non-contrast
Comparison: Plain films lumbar spine 10/03/2018.

CLINICAL DATA: Low back pain radiating to the right leg for 3-4
months. No known injury.

EXAM:
LUMBAR SPINE - COMPLETE WITH BENDING VIEWS

[Series 1: dg lumbar spine complete w/bend 6+v · 0.14mm/px · 8 of 8 slices shown]
[im 1/8]
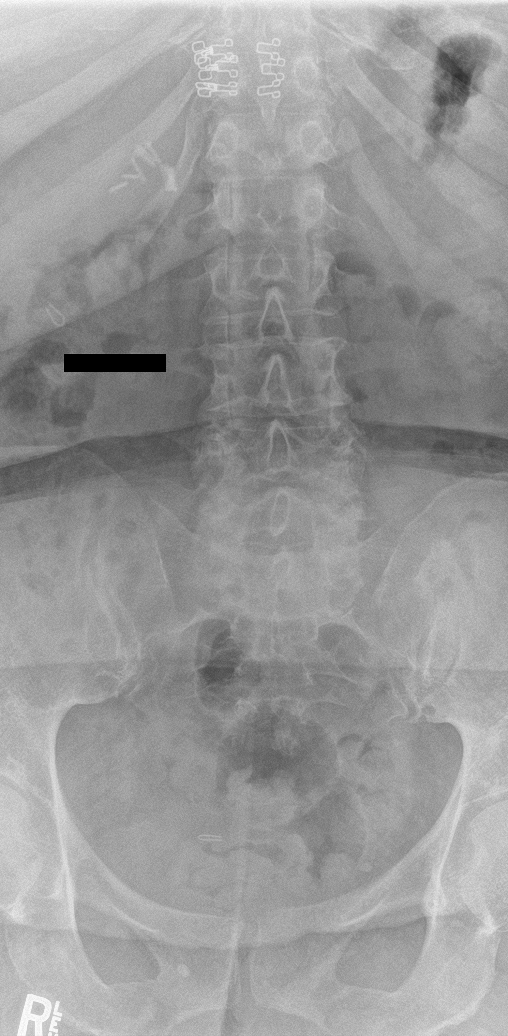
[im 2/8]
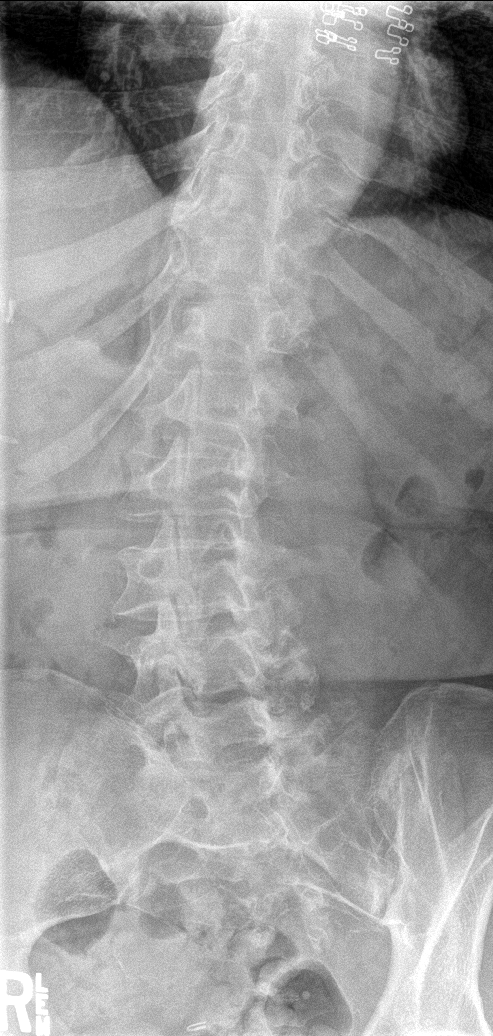
[im 3/8]
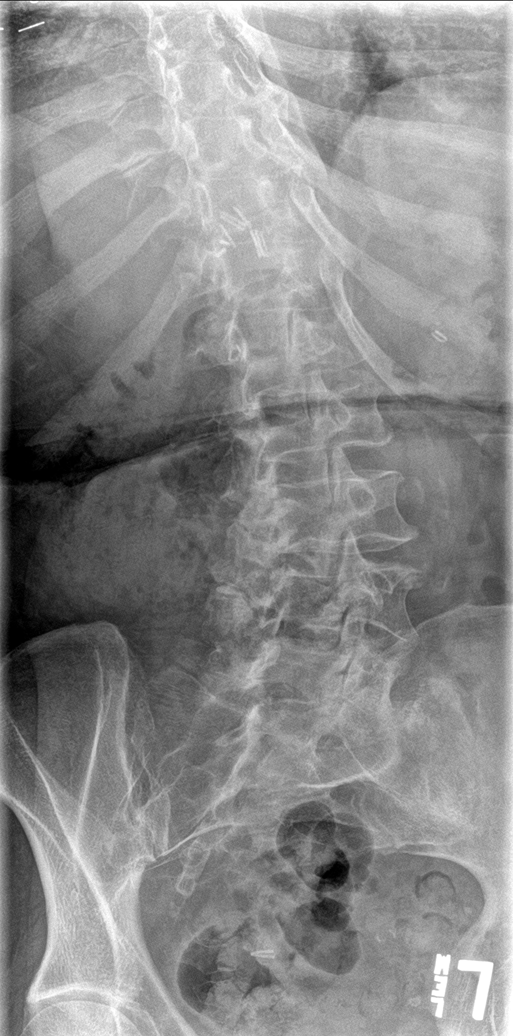
[im 4/8]
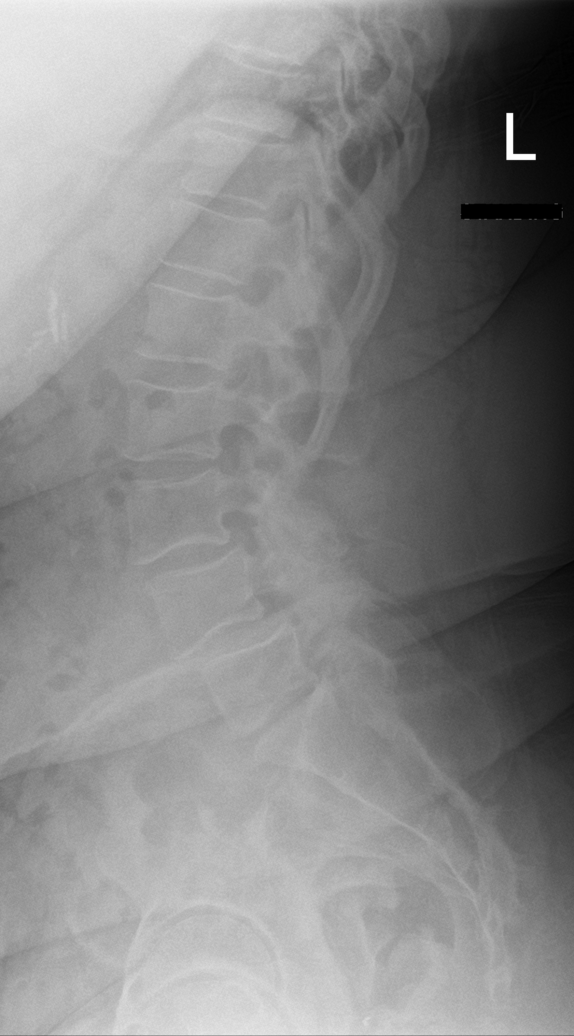
[im 5/8]
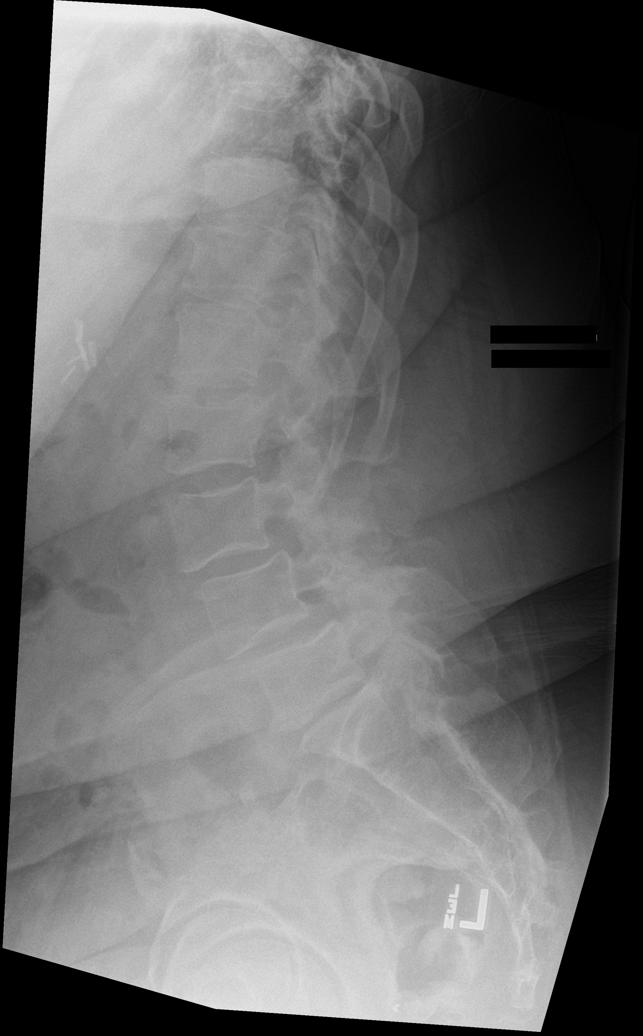
[im 6/8]
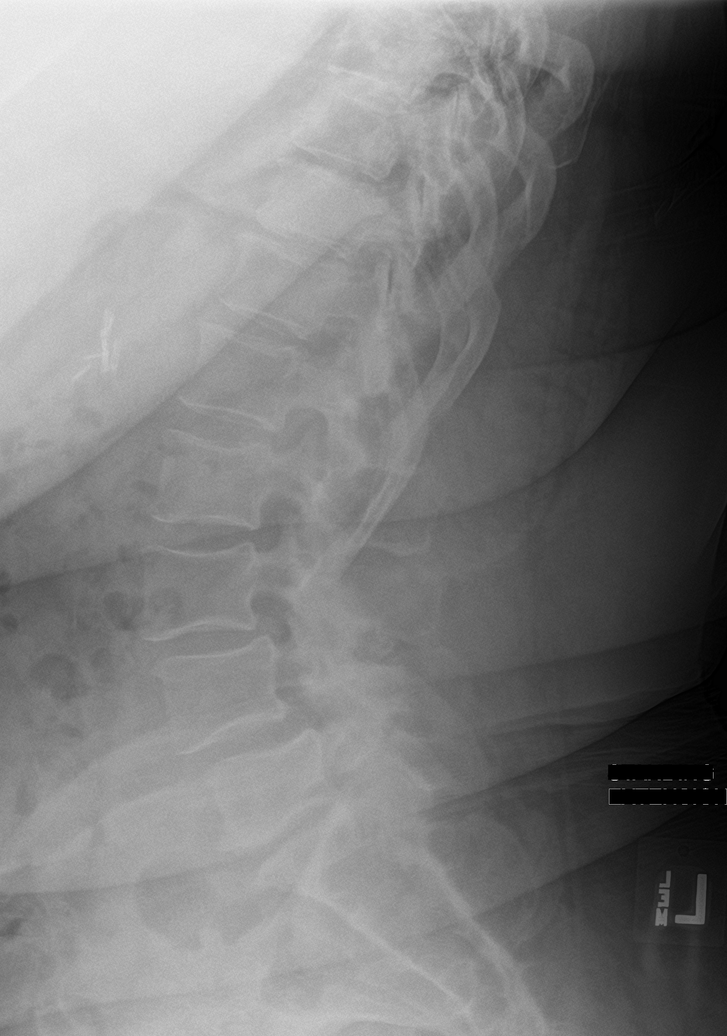
[im 7/8]
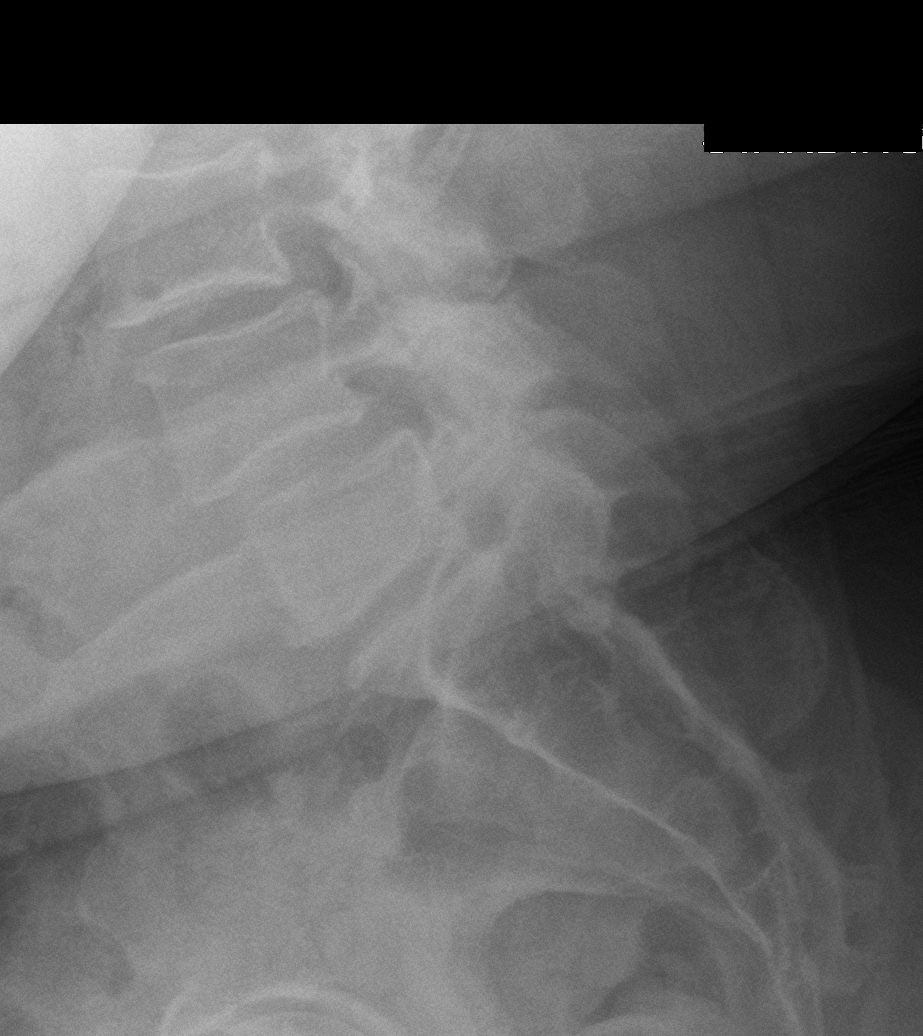
[im 8/8]
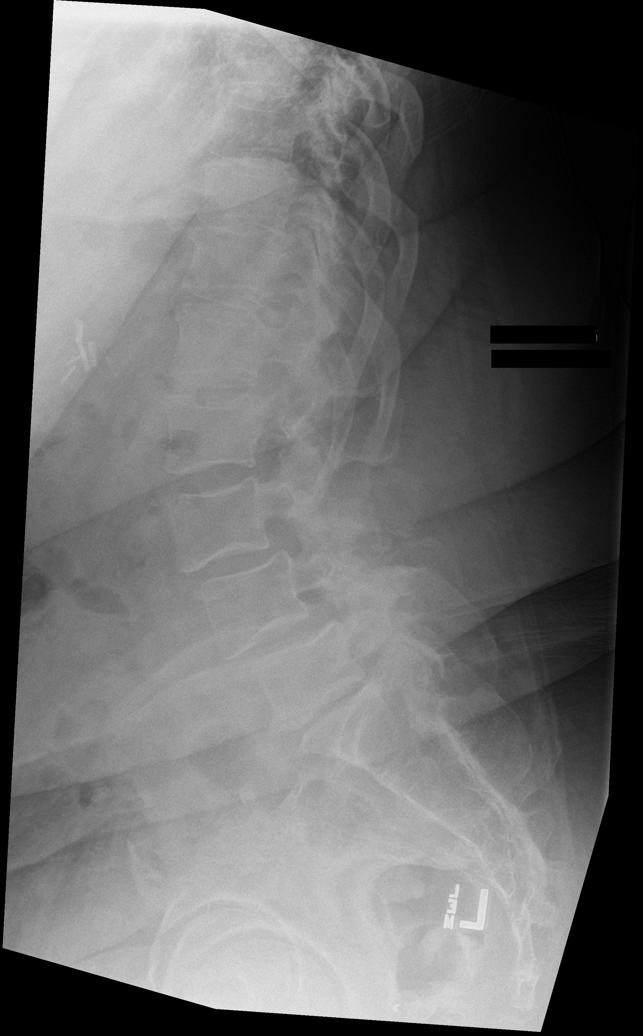

[8 of 8 positions shown; findings below may reference images not displayed]

FINDINGS: No fracture. Facet degenerative change results in 0.3 cm
anterolisthesis L3 on L4 and 0.6 cm anterolisthesis L4 on L5,
unchanged. Intervertebral disc space height is maintained.
Paraspinous structures demonstrate no acute or focal abnormality.
IMPRESSION: No acute finding.

No change in facet mediated grade 1 anterolisthesis L3 on L4 and L4
on L5.

## 2019-07-27 IMAGING — CR BILATERAL SACROILIAC JOINTS - 3+ VIEW
1 series · 3 of 3 positions shown · non-contrast
Comparison: None.

CLINICAL DATA: Back pain radiating down the leg

EXAM:
BILATERAL SACROILIAC JOINTS - 3+ VIEW

[Series 1: dg si joints · 0.14mm/px · 3 of 3 slices shown]
[im 1/3]
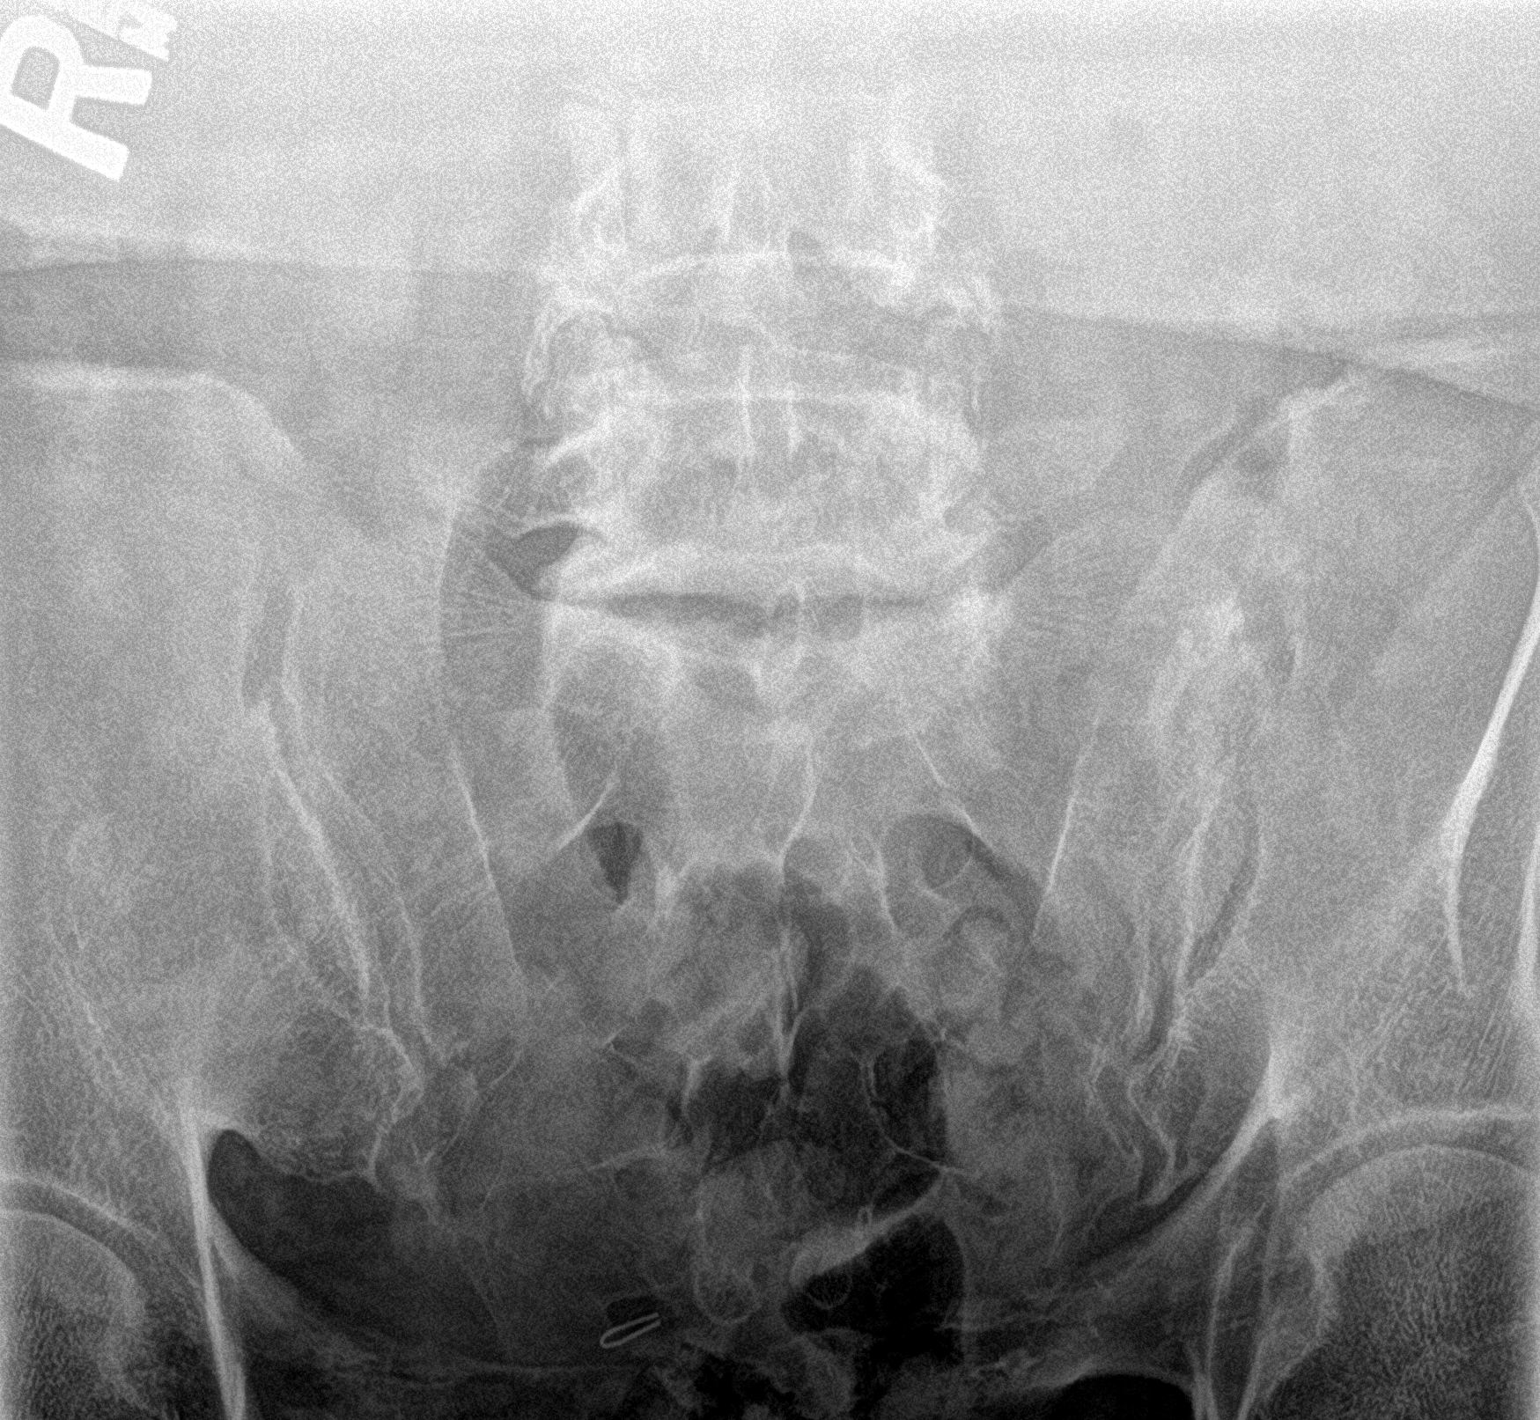
[im 2/3]
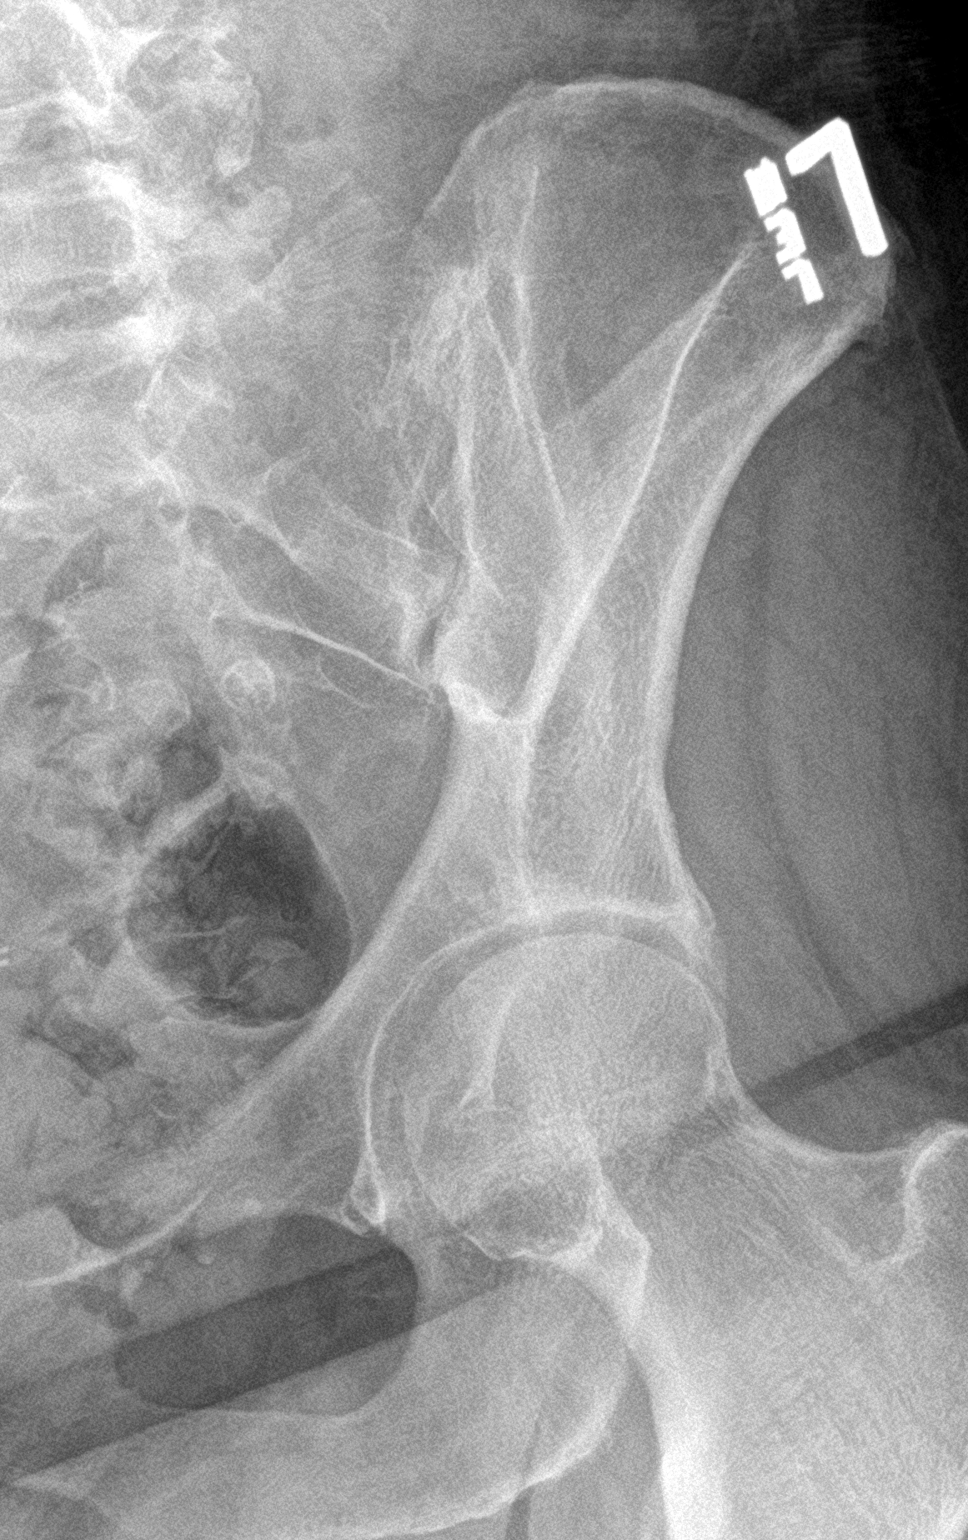
[im 3/3]
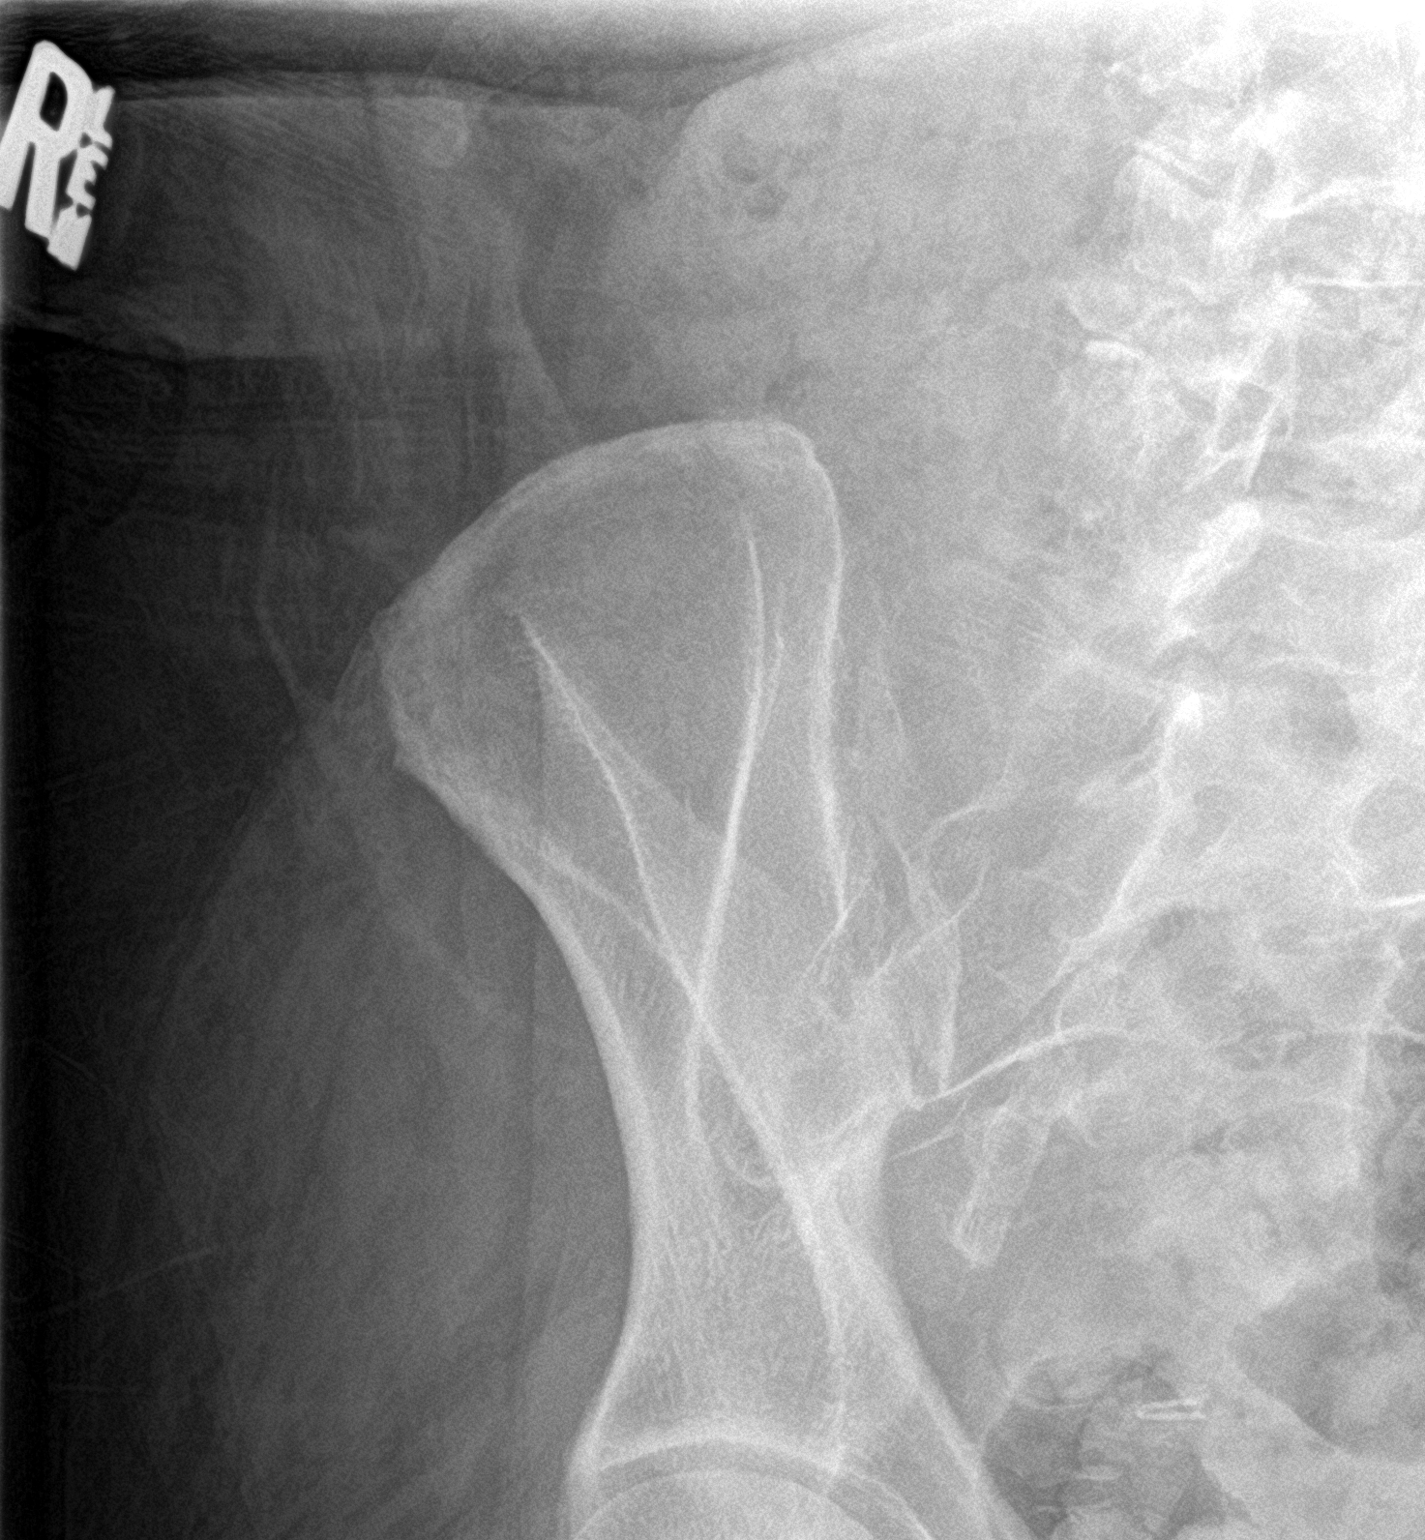

[3 of 3 positions shown; findings below may reference images not displayed]

FINDINGS: SI joints are patent. There is minimal degenerative change of the
left SI joint.
IMPRESSION: Minimal degenerative change of left SI joint

## 2019-07-31 ENCOUNTER — Encounter: Payer: Self-pay | Admitting: Pain Medicine

## 2019-07-31 ENCOUNTER — Telehealth: Payer: Self-pay

## 2019-08-03 NOTE — Progress Notes (Signed)
Pain Management Virtual Encounter Note - Virtual Visit via Telephone Telehealth (real-time audio visits between healthcare provider and patient).   Patient's Phone No. & Preferred Pharmacy:  [phone] (home); [phone] (mobile); (Preferred) [phone] [email or web address]  [facility] [phone] [place] [address] Phone: [phone] Fax: [phone]    Pre-screening note:  Our staff contacted Megan Perez and offered her an "in person", "face-to-face" appointment versus a telephone encounter. She indicated preferring the telephone encounter, at this time.   Reason for Virtual Visit: COVID-19*  Social distancing based on CDC and AMA recommendations.   I contacted Megan Perez on 08/04/2019 via telephone.      I clearly identified myself as Gaspar Cola, MD. I verified that I was speaking with the correct person using two identifiers (Name: [name], and date of birth: 1946/02/27).  Advanced Informed Consent I sought verbal advanced consent from Megan Perez for virtual visit interactions. I informed Ms. Castronovo of possible security and privacy concerns, risks, and limitations associated with providing "not-in-person" medical evaluation and management services. I also informed Ms. Boehner of the availability of "in-person" appointments. Finally, I informed her that there would be a charge for the virtual visit and that she could be  personally, fully or partially, financially responsible for it. Megan Perez expressed understanding and agreed to proceed.   Historic Elements   Megan Perez is a 73 y.o. year old, female patient evaluated today after her last encounter by our practice on 07/07/2019. Megan Perez  has a past medical history of Anxiety, Asthma, Bronchitis, GERD (gastroesophageal reflux disease), Hypertension, Morbid obesity with BMI of 40.0-44.9, adult ([place]) (04/08/2012),  OA (osteoarthritis), Osteoporosis, Prediabetes, Sinus infection, and Venous (peripheral) insufficiency (08/19/2010). She also  has a past surgical history that includes Abdominal hysterectomy; Cholecystectomy; Replacement total knee; Heel spur surgery; Shoulder surgery (Left); Shoulder arthroscopy with open rotator cuff repair (Right, 09/15/2015); Replacement total knee (Right); Carpal tunnel release (Right); and Colonoscopy with propofol (N/A, 06/19/2017). Megan Perez has a current medication list which includes the following prescription(s): acetaminophen, aspirin ec, azelastine, b-d assure bpm/auto wrist cuff, calcium carbonate, diclofenac sodium, furosemide, lisinopril, pregabalin, pregabalin, pregabalin, pregabalin, sucralfate, and tizanidine. She  reports that she has never smoked. She has never used smokeless tobacco. She reports that she does not drink alcohol or use drugs. Megan Perez is allergic to baclofen; meclizine; other; penicillins; prednisone; prevacid [lansoprazole]; gabapentin; and oxycodone.   HPI  Today, she is being contacted for follow-up evaluation for Lyrica titration. The patient indicates doing well with the current medication regimen. No adverse reactions or side effects reported to the medications.  She indicates not having any type of side effects to the Lyrica but she also indicates that having any significant benefit yet.  Today she was reminded that it takes 2 to 3 weeks for the medicine to reach adequate steady levels.  She was also reminded that we are only starting this titration.  Today I will be sending her pharmacy some additional prescriptions to continue titrating the medication up to see tolerates it.  She is pending to come in on Thursday, 08/07/2019, for therapeutic right-sided lumbar facet RFA #1 under fluoroscopic guidance and IV sedation.  Pharmacotherapy Assessment  Analgesic: No opioid analgesics from our practice.   Monitoring: Pharmacotherapy: No side-effects  or adverse reactions reported. [place] PMP: PDMP reviewed during this encounter.  Compliance: No problems identified. Effectiveness: Clinically acceptable. Plan: Refer to "POC".  UDS:  Summary  Date Value Ref Range Status  11/05/2018 FINAL  Final    Comment:    ==================================================================== [facility] COMP DRUG ANALYSIS,UR ==================================================================== Test                             Result       Flag       Units Drug Present and Declared for Prescription Verification   Acetaminophen                  PRESENT      EXPECTED Drug Absent but Declared for Prescription Verification   Baclofen                       Not Detected UNEXPECTED   Diclofenac                     Not Detected UNEXPECTED    Topical diclofenac, as indicated in the declared medication list,    is not always detected even when used as directed.   Salicylate                     Not Detected UNEXPECTED    Aspirin, as indicated in the declared medication list, is not    always detected even when used as directed. ==================================================================== Test                      Result    Flag   Units      Ref Range   Creatinine              93               mg/dL      >=27 ==================================================================== Declared Medications:  The flagging and interpretation on this report are based on the  following declared medications.  Unexpected results may arise from  inaccuracies in the declared medications.  **Note: The testing scope of this panel includes these medications:  Baclofen  **Note: The testing scope of this panel does not include small to  moderate amounts of these reported medications:  Acetaminophen  Aspirin (Aspirin 81)  Topical Diclofenac  **Note: The testing scope of this panel does not include following  reported medications:  Azelastine  Furosemide   Lisinopril  Pantoprazole  Potassium  Pravastatin  Sucralfate ==================================================================== For clinical consultation, please call [phone]. ====================================================================    Laboratory Chemistry Profile (12 mo)  Renal: 05/28/2019: BUN 19; BUN/Creatinine Ratio 18; Creat 1.03  Lab Results  Component Value Date   GFRAA 62 05/28/2019   GFRNONAA 54 (L) 05/28/2019   Hepatic: No results found for requested labs within last 8760 hours. Lab Results  Component Value Date   AST 11 05/28/2019   ALT 8 05/28/2019   Other: 11/05/2018: 25-Hydroxy, Vitamin D 13; 25-Hydroxy, Vitamin D-2 <1.0; 25-Hydroxy, Vitamin D-3 13; CRP 11; Sed Rate 30; Vitamin B-12 468 Note: Above Lab results reviewed.  Imaging  Last 90 days:  Dg [facility] C-arm 1-60 Min No Report  Result Date: 06/12/2019 Fluoro was used, but no Radiologist interpretation will be provided. Please refer to "NOTES" tab for provider progress note.  Assessment  The primary encounter diagnosis was Chronic pain syndrome. Diagnoses of Chronic low back pain (Primary Area of Pain) (Bilateral) (R>L) w/o sciatica, Chronic lower extremity pain (  Secondary Area of Pain) (Right), Chronic knee pain (Tertiary Area of Pain) (Bilateral) (R>L), Neurogenic pain, Chronic peripheral neuropathic pain, and Chronic musculoskeletal pain were also pertinent to this visit.  Plan of Care  I have changed Dominque A. Schwandt's pregabalin. I am also having her start on pregabalin, pregabalin, and pregabalin. Additionally, I am having her maintain her aspirin EC, acetaminophen, azelastine, diclofenac sodium, furosemide, sucralfate, B-D ASSURE BPM/AUTO WRIST CUFF, lisinopril, calcium carbonate, and tiZANidine.  Pharmacotherapy (Medications Ordered): Meds ordered this encounter  Medications  . pregabalin (LYRICA) 25 MG capsule    Sig: Take 1 capsule (25 mg total) by mouth 3 (three) times daily for  21 days.    Dispense:  63 capsule    Refill:  0    Med titration: Follow specific schedule, avoid dispensing out of order. Fill 1 day early if closed on scheduled date. Fill on: 08/06/2019  . tiZANidine (ZANAFLEX) 2 MG tablet    Sig: Take 2 tablets (4 mg total) by mouth at bedtime. May also take 1 tablet (2 mg total) daily as needed for muscle spasms.    Dispense:  90 tablet    Refill:  5    Fill one day early if pharmacy is closed on scheduled refill date. May substitute for generic if available.  . pregabalin (LYRICA) 50 MG capsule    Sig: Take 1 capsule (50 mg total) by mouth 2 (two) times daily for 21 days, THEN 1 capsule (50 mg total) 3 (three) times daily for 21 days.    Dispense:  105 capsule    Refill:  0    Med titration: Follow specific schedule, avoid dispensing out of order. Fill 1 day early if closed on scheduled date. Fill on: 11/03/2019  . pregabalin (LYRICA) 75 MG capsule    Sig: Take 1 capsule (75 mg total) by mouth 2 (two) times daily for 21 days, THEN 1 capsule (75 mg total) 3 (three) times daily for 21 days.    Dispense:  105 capsule    Refill:  0    Med titration: Follow specific schedule, avoid dispensing out of order. Fill 1 day early if closed on scheduled date. Fill on: 10/07/2019  . pregabalin (LYRICA) 100 MG capsule    Sig: Take 1 capsule (100 mg total) by mouth 2 (two) times daily for 21 days, THEN 1 capsule (100 mg total) 3 (three) times daily for 21 days.    Dispense:  105 capsule    Refill:  0    Med titration: Follow specific schedule, avoid dispensing out of order. Fill 1 day early if closed on scheduled date. Fill on: 10/07/2019   Orders:  No orders of the defined types were placed in this encounter.  Follow-up plan:   Return in about 5 months (around 12/24/2019) for (VV), (MM) for Lyrica titration and right lumbar facet RFA #1 (08/07/2019).      Interventional treatment options:  Under consideration:   Therapeutic right lumbar facet RFA #1 (scheduled for  08/07/2019) Possible therapeutic left lumbar facet RFA #1  Diagnostic bilateral sacroiliac joint blocks  Possible bilateral sacroiliac joint RFA  Diagnostic right L4 TFESI  Diagnostic right L3-4 LESI  Diagnostic bilateral genicular NB  Possible bilateral genicular nerve RFA    Therapeutic/palliative (PRN):   Diagnostic bilateral lumbar facet block #3     Recent Visits Date Type Provider Dept  07/07/19 Office Visit Delano Metz, MD [facility]  06/12/19 Procedure visit Delano Metz, MD [facility]  Showing recent visits within past 90 days and meeting all other requirements   Today's Visits Date Type Provider Dept  08/04/19 Telemedicine Delano MetzNaveira, Seryna Marek, MD [facility]  Showing today's visits and meeting all other requirements   Future Appointments Date Type Provider Dept  08/07/19 Appointment Delano MetzNaveira, Naliah Eddington, MD [facility]  Showing future appointments within next 90 days and meeting all other requirements   I discussed the assessment and treatment plan with the patient. The patient was provided an opportunity to ask questions and all were answered. The patient agreed with the plan and demonstrated an understanding of the instructions.  Patient advised to call back or seek an in-person evaluation if the symptoms or condition worsens.  Total duration of non-face-to-face encounter: 12 minutes.  Note by: Oswaldo DoneFrancisco A Bradley Bostelman, MD Date: 08/04/2019; Time: 9:37 AM  Note: This dictation was prepared with Dragon dictation. Any transcriptional errors that may result from this process are unintentional.  Disclaimer:  * Given the special circumstances of the COVID-19 pandemic, the federal government has announced that the Office for Civil Rights (OCR) will exercise its enforcement discretion and will not impose penalties on physicians using telehealth in the event of noncompliance with regulatory requirements under the [employer]  Insurance Portability and Accountability Act (HIPAA) in connection with the good faith provision of telehealth during the COVID-19 national public health emergency. (AMA)

## 2019-08-04 ENCOUNTER — Other Ambulatory Visit: Payer: Self-pay

## 2019-08-04 ENCOUNTER — Ambulatory Visit: Payer: Medicare HMO | Attending: Pain Medicine | Admitting: Pain Medicine

## 2019-08-04 ENCOUNTER — Telehealth: Payer: Self-pay | Admitting: *Deleted

## 2019-08-04 DIAGNOSIS — M25562 Pain in left knee: Secondary | ICD-10-CM

## 2019-08-04 DIAGNOSIS — M545 Low back pain, unspecified: Secondary | ICD-10-CM

## 2019-08-04 DIAGNOSIS — M25561 Pain in right knee: Secondary | ICD-10-CM

## 2019-08-04 DIAGNOSIS — M792 Neuralgia and neuritis, unspecified: Secondary | ICD-10-CM

## 2019-08-04 DIAGNOSIS — M7918 Myalgia, other site: Secondary | ICD-10-CM

## 2019-08-04 DIAGNOSIS — M79604 Pain in right leg: Secondary | ICD-10-CM | POA: Diagnosis not present

## 2019-08-04 DIAGNOSIS — G8929 Other chronic pain: Secondary | ICD-10-CM

## 2019-08-04 DIAGNOSIS — G894 Chronic pain syndrome: Secondary | ICD-10-CM | POA: Diagnosis not present

## 2019-08-04 MED ORDER — TIZANIDINE HCL 2 MG PO TABS: ORAL_TABLET | ORAL | 5 refills | Status: DC

## 2019-08-04 MED ORDER — PREGABALIN 25 MG PO CAPS: 25.0000 mg | ORAL_CAPSULE | Freq: Three times a day (TID) | ORAL | 0 refills | Status: DC

## 2019-08-04 MED ORDER — PREGABALIN 100 MG PO CAPS: ORAL_CAPSULE | ORAL | 0 refills | Status: DC

## 2019-08-04 MED ORDER — PREGABALIN 50 MG PO CAPS: ORAL_CAPSULE | ORAL | 0 refills | Status: DC

## 2019-08-04 MED ORDER — PREGABALIN 75 MG PO CAPS: ORAL_CAPSULE | ORAL | 0 refills | Status: DC

## 2019-08-04 NOTE — Telephone Encounter (Signed)
I explained the gradual taper of Pregabalin. Patient did not understand, I repeatedly described in detail how to take the medication. She said she did understand at the end of the conversation.

## 2019-08-07 ENCOUNTER — Encounter: Payer: Self-pay | Admitting: Pain Medicine

## 2019-08-07 ENCOUNTER — Other Ambulatory Visit: Payer: Self-pay

## 2019-08-07 ENCOUNTER — Ambulatory Visit
Admission: RE | Admit: 2019-08-07 | Discharge: 2019-08-07 | Disposition: A | Discharge status: Home / Self Care | Payer: Medicare HMO | Source: Ambulatory Visit | Attending: Pain Medicine | Admitting: Pain Medicine

## 2019-08-07 ENCOUNTER — Ambulatory Visit (HOSPITAL_BASED_OUTPATIENT_CLINIC_OR_DEPARTMENT_OTHER): Payer: Medicare HMO | Admitting: Pain Medicine

## 2019-08-07 VITALS — BP 131/65 | HR 79 | Temp 97.4°F | Resp 16 | Ht 67.0 in | Wt 268.0 lb

## 2019-08-07 DIAGNOSIS — M5137 Other intervertebral disc degeneration, lumbosacral region: Secondary | ICD-10-CM

## 2019-08-07 DIAGNOSIS — M545 Low back pain, unspecified: Secondary | ICD-10-CM

## 2019-08-07 DIAGNOSIS — M47817 Spondylosis without myelopathy or radiculopathy, lumbosacral region: Secondary | ICD-10-CM | POA: Diagnosis not present

## 2019-08-07 DIAGNOSIS — M4310 Spondylolisthesis, site unspecified: Secondary | ICD-10-CM

## 2019-08-07 DIAGNOSIS — G8929 Other chronic pain: Secondary | ICD-10-CM | POA: Diagnosis not present

## 2019-08-07 DIAGNOSIS — G8918 Other acute postprocedural pain: Secondary | ICD-10-CM

## 2019-08-07 DIAGNOSIS — M47816 Spondylosis without myelopathy or radiculopathy, lumbar region: Secondary | ICD-10-CM | POA: Insufficient documentation

## 2019-08-07 MED ORDER — LIDOCAINE HCL 2 % IJ SOLN
20.0000 mL | Freq: Once | INTRAMUSCULAR | Status: AC
  Administered 2019-08-07: 400 mg
  Filled 2019-08-07: qty 20

## 2019-08-07 MED ORDER — ROPIVACAINE HCL 2 MG/ML IJ SOLN
9.0000 mL | Freq: Once | INTRAMUSCULAR | Status: AC
  Administered 2019-08-07: 9 mL via PERINEURAL
  Filled 2019-08-07: qty 10

## 2019-08-07 MED ORDER — LACTATED RINGERS IV SOLN
1000.0000 mL | Freq: Once | INTRAVENOUS | Status: AC
  Administered 2019-08-07: 1000 mL via INTRAVENOUS

## 2019-08-07 MED ORDER — MIDAZOLAM HCL 5 MG/5ML IJ SOLN
1.0000 mg | INTRAMUSCULAR | Status: DC | PRN
  Administered 2019-08-07: 1 mg via INTRAVENOUS
  Filled 2019-08-07: qty 5

## 2019-08-07 MED ORDER — TRIAMCINOLONE ACETONIDE 40 MG/ML IJ SUSP
40.0000 mg | Freq: Once | INTRAMUSCULAR | Status: AC
  Administered 2019-08-07: 40 mg
  Filled 2019-08-07: qty 1

## 2019-08-07 MED ORDER — HYDROCODONE-ACETAMINOPHEN 5-325 MG PO TABS: 1.0000 | ORAL_TABLET | Freq: Four times a day (QID) | ORAL | 0 refills | Status: AC | PRN

## 2019-08-07 MED ORDER — FENTANYL CITRATE (PF) 100 MCG/2ML IJ SOLN
25.0000 ug | INTRAMUSCULAR | Status: DC | PRN
  Administered 2019-08-07: 25 ug via INTRAVENOUS
  Filled 2019-08-07: qty 2

## 2019-08-07 NOTE — Progress Notes (Signed)
Safety precautions to be maintained throughout the outpatient stay will include: orient to surroundings, keep bed in low position, maintain call bell within reach at all times, provide assistance with transfer out of bed and ambulation.  

## 2019-08-07 NOTE — Patient Instructions (Signed)

## 2019-08-07 NOTE — Progress Notes (Signed)
Patient's Name: Megan Perez  MRN: [id number]  Referring Provider: Delano Metz, MD  DOB: 24-Jun-1946  PCP: Galen Manila, NP (Inactive)  DOS: 08/07/2019  Note by: Oswaldo Done, MD  Service setting: Ambulatory outpatient  Specialty: Interventional Pain Management  Patient type: Established  Location: [facility] (AMB) [facility] Facility  Visit type: Interventional Procedure   Primary Reason for Visit: Interventional Pain Management Treatment. CC: Back Pain (right, lower)  Procedure:          Anesthesia, Analgesia, Anxiolysis:  Type: Thermal Lumbar Facet, Medial Branch Radiofrequency Ablation/Neurotomy  #1  Primary Purpose: Therapeutic Region: Posterolateral Lumbosacral Spine Level: L2, L3, L4, L5, & S1 Medial Branch Level(s). These levels will denervate the L3-4, L4-5, and the L5-S1 lumbar facet joints. Laterality: Right  Type: Moderate (Conscious) Sedation combined with Local Anesthesia Indication(s): Analgesia and Anxiety Route: Intravenous (IV) IV Access: Secured Sedation: Meaningful verbal contact was maintained at all times during the procedure  Local Anesthetic: Lidocaine 1-2%  Position: Prone   Indications: 1. Spondylosis without myelopathy or radiculopathy, lumbosacral region   2. Lumbar facet syndrome (Bilateral) (R>L)   3. Lumbar (6 mm) Anterolisthesis of L4/L5   4. Degenerative spondylolisthesis   5. DDD (degenerative disc disease), lumbosacral   6. Chronic low back pain (Primary Area of Pain) (Bilateral) (R>L) w/o sciatica    Megan Perez has been dealing with the above chronic pain for longer than three months and has either failed to respond, was unable to tolerate, or simply did not get enough benefit from other more conservative therapies including, but not limited to: 1. Over-the-counter medications 2. Anti-inflammatory medications 3. Muscle relaxants 4. Membrane stabilizers 5. Opioids 6. Physical therapy and/or chiropractic manipulation 7.  Modalities (Heat, ice, etc.) 8. Invasive techniques such as nerve blocks. Megan Perez has attained more than 50% relief of the pain from a series of diagnostic injections conducted in separate occasions.  Pain Score: Pre-procedure: 8 /10 Post-procedure: 0-No pain/10  Pre-op Assessment:  Megan Perez is a 73 y.o. (year old), female patient, seen today for interventional treatment. She  has a past surgical history that includes Abdominal hysterectomy; Cholecystectomy; Replacement total knee; Heel spur surgery; Shoulder surgery (Left); Shoulder arthroscopy with open rotator cuff repair (Right, 09/15/2015); Replacement total knee (Right); Carpal tunnel release (Right); and Colonoscopy with propofol (N/A, 06/19/2017). Megan Perez has a current medication list which includes the following prescription(s): acetaminophen, aspirin ec, azelastine, b-d assure bpm/auto wrist cuff, calcium carbonate, diclofenac sodium, furosemide, lisinopril, pregabalin, pregabalin, pregabalin, pregabalin, sucralfate, tizanidine, hydrocodone-acetaminophen, and hydrocodone-acetaminophen, and the following Facility-Administered Medications: fentanyl and midazolam. Her primarily concern today is the Back Pain (right, lower)  Initial Vital Signs:  Pulse/HCG Rate: 79ECG Heart Rate: 86 Temp: (!) 97.4 F (36.3 C) Resp: 16 BP: (!) 146/98 SpO2: 100 %  BMI: Estimated body mass index is 41.97 kg/m as calculated from the following:   Height as of this encounter:  (1.702 m).   Weight as of this encounter: 268 lb (121.6 kg).  Risk Assessment: Allergies: Reviewed. She is allergic to baclofen; meclizine; other; penicillins; prednisone; prevacid [lansoprazole]; gabapentin; and oxycodone.  Allergy Precautions: None required Coagulopathies: Reviewed. None identified.  Blood-thinner therapy: None at this time Active Infection(s): Reviewed. None identified. Megan Perez is afebrile  Site Confirmation: Megan Perez was asked to confirm the  procedure and laterality before marking the site Procedure checklist: Completed Consent: Before the procedure and under the influence of no sedative(s), amnesic(s), or anxiolytics, the patient was informed of the treatment  options, risks and possible complications. To fulfill our ethical and legal obligations, as recommended by the American Medical Association's Code of Ethics, I have informed the patient of my clinical impression; the nature and purpose of the treatment or procedure; the risks, benefits, and possible complications of the intervention; the alternatives, including doing nothing; the risk(s) and benefit(s) of the alternative treatment(s) or procedure(s); and the risk(s) and benefit(s) of doing nothing. The patient was provided information about the general risks and possible complications associated with the procedure. These may include, but are not limited to: failure to achieve desired goals, infection, bleeding, organ or nerve damage, allergic reactions, paralysis, and death. In addition, the patient was informed of those risks and complications associated to Spine-related procedures, such as failure to decrease pain; infection (i.e.: Meningitis, epidural or intraspinal abscess); bleeding (i.e.: epidural hematoma, subarachnoid hemorrhage, or any other type of intraspinal or peri-dural bleeding); organ or nerve damage (i.e.: Any type of peripheral nerve, nerve root, or spinal cord injury) with subsequent damage to sensory, motor, and/or autonomic systems, resulting in permanent pain, numbness, and/or weakness of one or several areas of the body; allergic reactions; (i.e.: anaphylactic reaction); and/or death. Furthermore, the patient was informed of those risks and complications associated with the medications. These include, but are not limited to: allergic reactions (i.e.: anaphylactic or anaphylactoid reaction(s)); adrenal axis suppression; blood sugar elevation that in diabetics may result  in ketoacidosis or comma; water retention that in patients with history of congestive heart failure may result in shortness of breath, pulmonary edema, and decompensation with resultant heart failure; weight gain; swelling or edema; medication-induced neural toxicity; particulate matter embolism and blood vessel occlusion with resultant organ, and/or nervous system infarction; and/or aseptic necrosis of one or more joints. Finally, the patient was informed that Medicine is not an exact science; therefore, there is also the possibility of unforeseen or unpredictable risks and/or possible complications that may result in a catastrophic outcome. The patient indicated having understood very clearly. We have given the patient no guarantees and we have made no promises. Enough time was given to the patient to ask questions, all of which were answered to the patient's satisfaction. Megan Perez has indicated that she wanted to continue with the procedure. Attestation: I, the ordering provider, attest that I have discussed with the patient the benefits, risks, side-effects, alternatives, likelihood of achieving goals, and potential problems during recovery for the procedure that I have provided informed consent. Date   Time: 08/07/2019 10:44 AM  Pre-Procedure Preparation:  Monitoring: As per clinic protocol. Respiration, ETCO2, SpO2, BP, heart rate and rhythm monitor placed and checked for adequate function Safety Precautions: Patient was assessed for positional comfort and pressure points before starting the procedure. Time-out: I initiated and conducted the "Time-out" before starting the procedure, as per protocol. The patient was asked to participate by confirming the accuracy of the "Time Out" information. Verification of the correct person, site, and procedure were performed and confirmed by me, the nursing staff, and the patient. "Time-out" conducted as per Joint Commission's Universal Protocol  (UP.01.01.01). Time: 1124  Description of Procedure:          Laterality: Right Levels:  L2, L3, L4, L5, & S1 Medial Branch Level(s), at the L3-4, L4-5, and the L5-S1 lumbar facet joints. Area Prepped: Lumbosacral Prepping solution: DuraPrep (Iodine Povacrylex [0.7% available iodine] and Isopropyl Alcohol, 74% w/w) Safety Precautions: Aspiration looking for blood return was conducted prior to all injections. At no point did we inject any  substances, as a needle was being advanced. Before injecting, the patient was told to immediately notify me if she was experiencing any new onset of "ringing in the ears, or metallic taste in the mouth". No attempts were made at seeking any paresthesias. Safe injection practices and needle disposal techniques used. Medications properly checked for expiration dates. SDV (single dose vial) medications used. After the completion of the procedure, all disposable equipment used was discarded in the proper designated medical waste containers. Local Anesthesia: Protocol guidelines were followed. The patient was positioned over the fluoroscopy table. The area was prepped in the usual manner. The time-out was completed. The target area was identified using fluoroscopy. A 12-in long, straight, sterile hemostat was used with fluoroscopic guidance to locate the targets for each level blocked. Once located, the skin was marked with an approved surgical skin marker. Once all sites were marked, the skin (epidermis, dermis, and hypodermis), as well as deeper tissues (fat, connective tissue and muscle) were infiltrated with a small amount of a short-acting local anesthetic, loaded on a 10cc syringe with a 25G, 1.5-in  Needle. An appropriate amount of time was allowed for local anesthetics to take effect before proceeding to the next step. Local Anesthetic: Lidocaine 2.0% The unused portion of the local anesthetic was discarded in the proper designated containers. Technical explanation of  process:  Radiofrequency Ablation (RFA) L2 Medial Branch Nerve RFA: The target area for the L2 medial branch is at the junction of the postero-lateral aspect of the superior articular process and the superior, posterior, and medial edge of the transverse process of L3. Under fluoroscopic guidance, a Radiofrequency needle was inserted until contact was made with os over the superior postero-lateral aspect of the pedicular shadow (target area). Sensory and motor testing was conducted to properly adjust the position of the needle. Once satisfactory placement of the needle was achieved, the numbing solution was slowly injected after negative aspiration for blood. 2.0 mL of the nerve block solution was injected without difficulty or complication. After waiting for at least 3 minutes, the ablation was performed. Once completed, the needle was removed intact. L3 Medial Branch Nerve RFA: The target area for the L3 medial branch is at the junction of the postero-lateral aspect of the superior articular process and the superior, posterior, and medial edge of the transverse process of L4. Under fluoroscopic guidance, a Radiofrequency needle was inserted until contact was made with os over the superior postero-lateral aspect of the pedicular shadow (target area). Sensory and motor testing was conducted to properly adjust the position of the needle. Once satisfactory placement of the needle was achieved, the numbing solution was slowly injected after negative aspiration for blood. 2.0 mL of the nerve block solution was injected without difficulty or complication. After waiting for at least 3 minutes, the ablation was performed. Once completed, the needle was removed intact. L4 Medial Branch Nerve RFA: The target area for the L4 medial branch is at the junction of the postero-lateral aspect of the superior articular process and the superior, posterior, and medial edge of the transverse process of L5. Under fluoroscopic  guidance, a Radiofrequency needle was inserted until contact was made with os over the superior postero-lateral aspect of the pedicular shadow (target area). Sensory and motor testing was conducted to properly adjust the position of the needle. Once satisfactory placement of the needle was achieved, the numbing solution was slowly injected after negative aspiration for blood. 2.0 mL of the nerve block solution was injected without difficulty  or complication. After waiting for at least 3 minutes, the ablation was performed. Once completed, the needle was removed intact. L5 Medial Branch Nerve RFA: The target area for the L5 medial branch is at the junction of the postero-lateral aspect of the superior articular process of S1 and the superior, posterior, and medial edge of the sacral ala. Under fluoroscopic guidance, a Radiofrequency needle was inserted until contact was made with os over the superior postero-lateral aspect of the pedicular shadow (target area). Sensory and motor testing was conducted to properly adjust the position of the needle. Once satisfactory placement of the needle was achieved, the numbing solution was slowly injected after negative aspiration for blood. 2.0 mL of the nerve block solution was injected without difficulty or complication. After waiting for at least 3 minutes, the ablation was performed. Once completed, the needle was removed intact. S1 Medial Branch Nerve RFA: The target area for the S1 medial branch is located inferior to the junction of the S1 superior articular process and the L5 inferior articular process, posterior, inferior, and lateral to the 6 o'clock position of the L5-S1 facet joint, just superior to the S1 posterior foramen. Under fluoroscopic guidance, the Radiofrequency needle was advanced until contact was made with os over the Target area. Sensory and motor testing was conducted to properly adjust the position of the needle. Once satisfactory placement of the  needle was achieved, the numbing solution was slowly injected after negative aspiration for blood. 2.0 mL of the nerve block solution was injected without difficulty or complication. After waiting for at least 3 minutes, the ablation was performed. Once completed, the needle was removed intact. Radiofrequency lesioning (ablation):  Radiofrequency Generator: NeuroTherm [id number] Sensory Stimulation Parameters: 50 Hz was used to locate & identify the nerve, making sure that the needle was positioned such that there was no sensory stimulation below 0.3 V or above 0.7 V. Motor Stimulation Parameters: 2 Hz was used to evaluate the motor component. Care was taken not to lesion any nerves that demonstrated motor stimulation of the lower extremities at an output of less than 2.5 times that of the sensory threshold, or a maximum of 2.0 V. Lesioning Technique Parameters: Standard Radiofrequency settings. (Not bipolar or pulsed.) Temperature Settings: 80 degrees C Lesioning time: 60 seconds Intra-operative Compliance: Compliant Materials & Medications: Needle(s) (Electrode/Cannula) Type: Teflon-coated, curved tip, Radiofrequency needle(s) Gauge: 22G Length: 10cm Numbing solution: 0.2% PF-Ropivacaine + Triamcinolone (40 mg/mL) diluted to a final concentration of 4 mg of Triamcinolone/mL of Ropivacaine The unused portion of the solution was discarded in the proper designated containers.  Once the entire procedure was completed, the treated area was cleaned, making sure to leave some of the prepping solution back to take advantage of its long term bactericidal properties.  Illustration of the posterior view of the lumbar spine and the posterior neural structures. Laminae of L2 through S1 are labeled. DPRL5, dorsal primary ramus of L5; DPRS1, dorsal primary ramus of S1; DPR3, dorsal primary ramus of L3; FJ, facet (zygapophyseal) joint L3-L4; I, inferior articular process of L4; LB1, lateral branch of dorsal primary  ramus of L1; IAB, inferior articular branches from L3 medial branch (supplies L4-L5 facet joint); IBP, intermediate branch plexus; MB3, medial branch of dorsal primary ramus of L3; NR3, third lumbar nerve root; S, superior articular process of L5; SAB, superior articular branches from L4 (supplies L4-5 facet joint also); TP3, transverse process of L3.  Vitals:   08/07/19 1154 08/07/19 1200 08/07/19 1210 08/07/19 1220  BP: [name])  158/89 126/66 135/65 131/65  Pulse:      Resp: (!) Temp:      TempSrc:      SpO2: 99% 100% 100% 100%  Weight:      Height:        Start Time: 1124 hrs. End Time: 1154 hrs.  Imaging Guidance (Spinal):          Type of Imaging Technique: Fluoroscopy Guidance (Spinal) Indication(s): Assistance in needle guidance and placement for procedures requiring needle placement in or near specific anatomical locations not easily accessible without such assistance. Exposure Time: Please see nurses notes. Contrast: None used. Fluoroscopic Guidance: I was personally present during the use of fluoroscopy. "Tunnel Vision Technique" used to obtain the best possible view of the target area. Parallax error corrected before commencing the procedure. "Direction-depth-direction" technique used to introduce the needle under continuous pulsed fluoroscopy. Once target was reached, antero-posterior, oblique, and lateral fluoroscopic projection used confirm needle placement in all planes. Images permanently stored in EMR. Interpretation: No contrast injected. I personally interpreted the imaging intraoperatively. Adequate needle placement confirmed in multiple planes. Permanent images saved into the patient's record.  Antibiotic Prophylaxis:   Anti-infectives (From admission, onward)   None     Indication(s): None identified  Post-operative Assessment:  Post-procedure Vital Signs:  Pulse/HCG Rate: 7966 Temp: (!) 97.4 F (36.3 C) Resp: 16 BP: 131/65 SpO2: 100 %  EBL:  None  Complications: No immediate post-treatment complications observed by team, or reported by patient.  Note: The patient tolerated the entire procedure well. A repeat set of vitals were taken after the procedure and the patient was kept under observation following institutional policy, for this type of procedure. Post-procedural neurological assessment was performed, showing return to baseline, prior to discharge. The patient was provided with post-procedure discharge instructions, including a section on how to identify potential problems. Should any problems arise concerning this procedure, the patient was given instructions to immediately contact us, at any time, without hesitation. In any case, we plan to contact the patient by telephone for a follow-up status report regarding this interventional procedure.  Comments:  No additional relevant information.  Plan of Care  Orders:  Orders Placed This Encounter  Procedures   RFA - Lumbar Facet (Today)    Scheduling Instructions:     Side(s): Right-sided     Level: L3-4, L4-5, & L5-S1 Facets (L2, L3, L4, L5, & S1 Medial Branch Nerves)     Sedation: With Sedation     Timeframe: Today    Order Specific Question:   Where will this procedure be performed?    Answer:   [facility]   RFA - Lumbar Facet (Schedule)    Standing Status:   Future    Standing Expiration Date:   02/03/2021    Scheduling Instructions:     Side(s): Left-sided     Level: L3-4, L4-5, & L5-S1 Facets (L2, L3, L4, L5, & S1 Medial Branch Nerves)     Sedation: With Sedation     Scheduling Timeframe: 2 weeks from now    Order Specific Question:   Where will this procedure be performed?    Answer:   [facility]   Fluoro (C-Arm) (<60 min) (No Report)    Intraoperative interpretation by procedural physician at [facility].    Standing Status:   Standing    Number of Occurrences:   1    Order Specific Question:   Reason for exam:  Answer:    Assistance in needle guidance and placement for procedures requiring needle placement in or near specific anatomical locations not easily accessible without such assistance.   Consent: L-FCT (RFA)    Nursing Order: Transcribe to consent form and obtain patient signature. Note: Always confirm laterality of pain with Ms. Escue, before procedure. Procedure: Lumbar Facet Radiofrequency Ablation Indication/Reason: Low Back Pain, with our without leg pain, due to Facet Joint Arthralgia (Joint Pain) known as Lumbar Facet Syndrome, secondary to Lumbar, and/or Lumbosacral Spondylosis (Arthritis of the Spine), without myelopathy or radiculopathy (Nerve Damage). Provider Attestation: I, Seren Chaloux A. Laban EmperorNaveira, MD, (Pain Management Specialist), the physician/practitioner, attest that I have discussed with the patient the benefits, risks, side effects, alternatives, likelihood of achieving goals and potential problems during recovery for the procedure that I have provided informed consent.   Radiofrequency Tray    Equipment required: Sterile "Radiofrequency Tray"; Large hemostat (1); Small hemostat (1); Towels (6-8); 4x4 sterile sponge pack (1) Radiofrequency Needle(s): Size: Long Quantity: 5    Standing Status:   Standing    Number of Occurrences:   1    Order Specific Question:   Specify    Answer:   Radiofrequency Tray   Chronic Opioid Analgesic:  No opioid analgesics from our practice.   Medications ordered for procedure: Meds ordered this encounter  Medications   lidocaine (XYLOCAINE) 2 % (with pres) injection 400 mg   lactated ringers infusion 1,000 mL   midazolam (VERSED) 5 MG/5ML injection 1-2 mg    Make sure Flumazenil is available in the pyxis when using this medication. If oversedation occurs, administer 0.2 mg IV over 15 sec. If after 45 sec no response, administer 0.2 mg again over 1 min; may repeat at 1 min intervals; not to exceed 4 doses (1 mg)   fentaNYL (SUBLIMAZE) injection 25-50  mcg    Make sure Narcan is available in the pyxis when using this medication. In the event of respiratory depression (RR< 8/min): Titrate NARCAN (naloxone) in increments of 0.1 to 0.2 mg IV at 2-3 minute intervals, until desired degree of reversal.   ropivacaine (PF) 2 mg/mL (0.2%) (NAROPIN) injection 9 mL   triamcinolone acetonide (KENALOG-40) injection 40 mg   HYDROcodone-acetaminophen (NORCO/VICODIN) 5-325 MG tablet    Sig: Take 1 tablet by mouth every 6 (six) hours as needed for up to 7 days for severe pain. Must last 7 days.    Dispense:  28 tablet    Refill:  0    For acute post-operative pain. Not to be refilled. Must last 7 days.   HYDROcodone-acetaminophen (NORCO/VICODIN) 5-325 MG tablet    Sig: Take 1 tablet by mouth every 6 (six) hours as needed for up to 7 days for severe pain. Must last 7 days.    Dispense:  28 tablet    Refill:  0    For acute post-operative pain. Not to be refilled.  Must last 7 days.   Medications administered: We administered lidocaine, lactated ringers, midazolam, fentaNYL, ropivacaine (PF) 2 mg/mL (0.2%), and triamcinolone acetonide.  See the medical record for exact dosing, route, and time of administration.  Follow-up plan:   Return in about 2 weeks (around 08/21/2019) for PRN Procedure(s): (L) L-FCT RFA #1.       Interventional treatment options:  Under consideration:   Therapeutic right lumbar facet RFA #1 (scheduled for 08/07/2019) Possible therapeutic left lumbar facet RFA #1  Diagnostic bilateral sacroiliac joint blocks  Possible bilateral sacroiliac joint RFA  Diagnostic right  L4 TFESI  Diagnostic right L3-4 LESI  Diagnostic bilateral genicular NB  Possible bilateral genicular nerve RFA    Therapeutic/palliative (PRN):   Diagnostic bilateral lumbar facet block #3      Recent Visits Date Type Provider Dept  08/04/19 Telemedicine Delano Metz, MD [facility]  07/07/19 Office Visit Delano Metz, MD [facility]  06/12/19 Procedure visit Delano Metz, MD [facility]  Showing recent visits within past 90 days and meeting all other requirements   Today's Visits Date Type Provider Dept  08/07/19 Procedure visit Delano Metz, MD [facility]  Showing today's visits and meeting all other requirements   Future Appointments No visits were found meeting these conditions.  Showing future appointments within next 90 days and meeting all other requirements   Disposition: Discharge home  Discharge Date & Time: 08/07/2019; 1225 hrs.   Primary Care Physician: Galen Manila, NP (Inactive) Location: [facility] Note by: Oswaldo Done, MD Date: 08/07/2019; Time: 1:01 PM  Disclaimer:  Medicine is not an Visual merchandiser. The only guarantee in medicine is that nothing is guaranteed. It is important to note that the decision to proceed with this intervention was based on the information collected from the patient. The Data and conclusions were drawn from the patient's questionnaire, the interview, and the physical examination. Because the information was provided in large part by the patient, it cannot be guaranteed that it has not been purposely or unconsciously manipulated. Every effort has been made to obtain as much relevant data as possible for this evaluation. It is important to note that the conclusions that lead to this procedure are derived in large part from the available data. Always take into account that the treatment will also be dependent on availability of resources and existing treatment guidelines, considered by other Pain Management Practitioners as being common knowledge and practice, at the time of the intervention. For Medico-Legal purposes, it is also important to point out that variation in procedural techniques and pharmacological choices are the acceptable norm. The indications, contraindications, technique, and  results of the above procedure should only be interpreted and judged by a Board-Certified Interventional Pain Specialist with extensive familiarity and expertise in the same exact procedure and technique.

## 2019-08-08 ENCOUNTER — Telehealth: Payer: Self-pay

## 2019-08-08 NOTE — Telephone Encounter (Signed)
Post procedure phone call.  Patient states she is doing well.  

## 2019-08-24 ENCOUNTER — Other Ambulatory Visit: Payer: Self-pay | Admitting: Family Medicine

## 2019-08-24 DIAGNOSIS — I10 Essential (primary) hypertension: Secondary | ICD-10-CM

## 2019-09-02 ENCOUNTER — Encounter: Payer: Self-pay | Admitting: Pain Medicine

## 2019-09-02 ENCOUNTER — Ambulatory Visit
Admission: RE | Admit: 2019-09-02 | Discharge: 2019-09-02 | Disposition: A | Discharge status: Home / Self Care | Payer: Medicare HMO | Source: Ambulatory Visit | Attending: Pain Medicine | Admitting: Pain Medicine

## 2019-09-02 ENCOUNTER — Ambulatory Visit (HOSPITAL_BASED_OUTPATIENT_CLINIC_OR_DEPARTMENT_OTHER): Payer: Medicare HMO | Admitting: Pain Medicine

## 2019-09-02 ENCOUNTER — Other Ambulatory Visit: Payer: Self-pay

## 2019-09-02 VITALS — BP 121/70 | HR 74 | Temp 97.4°F | Resp 15 | Ht 67.0 in | Wt 268.0 lb

## 2019-09-02 DIAGNOSIS — M545 Low back pain, unspecified: Secondary | ICD-10-CM

## 2019-09-02 DIAGNOSIS — M47816 Spondylosis without myelopathy or radiculopathy, lumbar region: Secondary | ICD-10-CM

## 2019-09-02 DIAGNOSIS — G8929 Other chronic pain: Secondary | ICD-10-CM

## 2019-09-02 DIAGNOSIS — M5137 Other intervertebral disc degeneration, lumbosacral region: Secondary | ICD-10-CM | POA: Insufficient documentation

## 2019-09-02 DIAGNOSIS — M47817 Spondylosis without myelopathy or radiculopathy, lumbosacral region: Secondary | ICD-10-CM

## 2019-09-02 DIAGNOSIS — M4310 Spondylolisthesis, site unspecified: Secondary | ICD-10-CM | POA: Diagnosis not present

## 2019-09-02 DIAGNOSIS — G8918 Other acute postprocedural pain: Secondary | ICD-10-CM

## 2019-09-02 MED ORDER — TRIAMCINOLONE ACETONIDE 40 MG/ML IJ SUSP
40.0000 mg | Freq: Once | INTRAMUSCULAR | Status: AC
  Administered 2019-09-02: 40 mg

## 2019-09-02 MED ORDER — MIDAZOLAM HCL 5 MG/5ML IJ SOLN
INTRAMUSCULAR | Status: AC
  Filled 2019-09-02: qty 5

## 2019-09-02 MED ORDER — HYDROCODONE-ACETAMINOPHEN 5-325 MG PO TABS: 1.0000 | ORAL_TABLET | Freq: Four times a day (QID) | ORAL | 0 refills | Status: AC | PRN

## 2019-09-02 MED ORDER — LIDOCAINE HCL 2 % IJ SOLN
20.0000 mL | Freq: Once | INTRAMUSCULAR | Status: AC
  Administered 2019-09-02: 400 mg

## 2019-09-02 MED ORDER — ROPIVACAINE HCL 2 MG/ML IJ SOLN
INTRAMUSCULAR | Status: AC
  Filled 2019-09-02: qty 10

## 2019-09-02 MED ORDER — LIDOCAINE HCL 2 % IJ SOLN
INTRAMUSCULAR | Status: AC
  Filled 2019-09-02: qty 20

## 2019-09-02 MED ORDER — FENTANYL CITRATE (PF) 100 MCG/2ML IJ SOLN
25.0000 ug | INTRAMUSCULAR | Status: DC | PRN
  Administered 2019-09-02: 75 ug via INTRAVENOUS

## 2019-09-02 MED ORDER — FENTANYL CITRATE (PF) 100 MCG/2ML IJ SOLN
INTRAMUSCULAR | Status: AC
  Filled 2019-09-02: qty 2

## 2019-09-02 MED ORDER — LACTATED RINGERS IV SOLN
1000.0000 mL | Freq: Once | INTRAVENOUS | Status: AC
  Administered 2019-09-02: 1000 mL via INTRAVENOUS

## 2019-09-02 MED ORDER — MIDAZOLAM HCL 5 MG/5ML IJ SOLN
1.0000 mg | INTRAMUSCULAR | Status: DC | PRN
  Administered 2019-09-02: 3 mg via INTRAVENOUS

## 2019-09-02 MED ORDER — ROPIVACAINE HCL 2 MG/ML IJ SOLN
9.0000 mL | Freq: Once | INTRAMUSCULAR | Status: AC
  Administered 2019-09-02: 9 mL via PERINEURAL

## 2019-09-02 MED ORDER — TRIAMCINOLONE ACETONIDE 40 MG/ML IJ SUSP
INTRAMUSCULAR | Status: AC
  Filled 2019-09-02: qty 1

## 2019-09-02 NOTE — Progress Notes (Signed)
Disclaimer: In compliance with Federal Rules mandating open notes, implemented by the [employer] Act, these notes are made available to patients through the electronic medical record. Information contained herein reflects the providers review of information obtained during the pre-charting review of records, as well as notes taken during the patients appointment.  Although all data contained herein was reviewed by the provider, unless specifically stated, due to time constrains, not all of it was discussed with the patient during the appointment. Specific medical language and abbreviations used within medical records is meant to communicate precise data to individuals trained to interpret such data.  Warning: These encounter notes are not meant to serve the purpose of providing patients with clear and concise information on their conditions, treatment plan, or specific risks and possible complications. Such information is provided to patients under the encounter's "Patient Information" section. Interpretation of the information contained herein should be left to individuals with the necessary training to understand the data.  Patient's Name: Megan Perez  MRN: [id number]  Referring Provider: Delano MetzNaveira, Trenace Coughlin, MD  DOB: 12-04-1945  PCP: Galen ManilaKennedy, Lauren Renee, NP (Inactive)  DOS: 09/02/2019  Note by: Oswaldo DoneFrancisco A Alessia Gonsalez, MD  Service setting: Ambulatory outpatient  Specialty: Interventional Pain Management  Patient type: Established  Location: [facility] (AMB) Pain Management Facility  Visit type: Interventional Procedure   Primary Reason for Visit: Interventional Pain Management Treatment. CC: Back Pain (lower)  Procedure:          Anesthesia, Analgesia, Anxiolysis:  Type: Thermal Lumbar Facet, Medial Branch Radiofrequency Ablation/Neurotomy  #1  Primary Purpose: Therapeutic Region: Posterolateral Lumbosacral Spine Level: L2, L3, L4, L5, & S1 Medial Branch Level(s). These levels will denervate the  L3-4, L4-5, and the L5-S1 lumbar facet joints. Laterality: Left  Type: Moderate (Conscious) Sedation combined with Local Anesthesia Indication(s): Analgesia and Anxiety Route: Intravenous (IV) IV Access: Secured Sedation: Meaningful verbal contact was maintained at all times during the procedure  Local Anesthetic: Lidocaine 1-2%  Position: Prone   Indications: 1. Spondylosis without myelopathy or radiculopathy, lumbosacral region   2. Lumbar facet syndrome (Bilateral) (R>L)   3. DDD (degenerative disc disease), lumbosacral   4. Lumbar (6 mm) Anterolisthesis of L4/L5   5. Chronic low back pain (Primary Area of Pain) (Bilateral) (R>L) w/o sciatica    Ms. Samara SnideRuffin has been dealing with the above chronic pain for longer than three months and has either failed to respond, was unable to tolerate, or simply did not get enough benefit from other more conservative therapies including, but not limited to: 1. Over-the-counter medications 2. Anti-inflammatory medications 3. Muscle relaxants 4. Membrane stabilizers 5. Opioids 6. Physical therapy and/or chiropractic manipulation 7. Modalities (Heat, ice, etc.) 8. Invasive techniques such as nerve blocks. Ms. Samara SnideRuffin has attained more than 50% relief of the pain from a series of diagnostic injections conducted in separate occasions.  Pain Score: Pre-procedure: 6 /10 Post-procedure: 0-No pain/10  Post-Procedure Evaluation  Procedure: Therapeutic right-sided lumbar facet RFA #1 under fluoroscopic guidance and IV sedation Pre-procedure pain level: 8/10 Post-procedure: 0/10 (100% relief)  Sedation: Sedation provided.  Effectiveness during initial hour after procedure(Ultra-Short Term Relief): 100 %.  Local anesthetic used: Long-acting (4-6 hours) Effectiveness: Defined as any analgesic benefit obtained secondary to the administration of local anesthetics. This carries significant diagnostic value as to the etiological location, or anatomical  origin, of the pain. Duration of benefit is expected to coincide with the duration of the local anesthetic used.  Effectiveness during initial 4-6 hours after procedure(Short-Term Relief):  100 %.  Long-term benefit: Defined as any relief past the pharmacologic duration of the local anesthetics.  Effectiveness past the initial 6 hours after procedure(Long-Term Relief): 50 %(current).  Current benefits: Defined as benefit that persist at this time.   Analgesia:  50% improved Function: Ms. Locey reports improvement in function ROM: Ms. Troop reports improvement in ROM  Pre-op Assessment:  Ms. Hunton is a 73 y.o. (year old), female patient, seen today for interventional treatment. She  has a past surgical history that includes Abdominal hysterectomy; Cholecystectomy; Replacement total knee; Heel spur surgery; Shoulder surgery (Left); Shoulder arthroscopy with open rotator cuff repair (Right, 09/15/2015); Replacement total knee (Right); Carpal tunnel release (Right); and Colonoscopy with propofol (N/A, 06/19/2017). Ms. Cardosa has a current medication list which includes the following prescription(s): acetaminophen, aspirin ec, azelastine, b-d assure bpm/auto wrist cuff, calcium carbonate, diclofenac sodium, furosemide, lisinopril, [START ON 11/17/2019] pregabalin, pregabalin, [START ON 10/07/2019] pregabalin, sucralfate, [START ON 10/05/2019] tizanidine, hydrocodone-acetaminophen, and [START ON 09/09/2019] hydrocodone-acetaminophen, and the following Facility-Administered Medications: fentanyl and midazolam. Her primarily concern today is the Back Pain (lower)  Initial Vital Signs:  Pulse/HCG Rate: 76ECG Heart Rate: 80 Temp: (!) 97.4 F (36.3 C) Resp: 16 BP: (!) 144/87 SpO2: 100 %  BMI: Estimated body mass index is 41.97 kg/m as calculated from the following:   Height as of this encounter: 5\' 7"  (1.702 m).   Weight as of this encounter: 268 lb (121.6 kg).  Risk Assessment: Allergies: Reviewed.  She is allergic to baclofen; meclizine; other; penicillins; prednisone; prevacid [lansoprazole]; gabapentin; and oxycodone.  Allergy Precautions: None required Coagulopathies: Reviewed. None identified.  Blood-thinner therapy: None at this time Active Infection(s): Reviewed. None identified. Ms. Correia is afebrile  Site Confirmation: Ms. Grupp was asked to confirm the procedure and laterality before marking the site Procedure checklist: Completed Consent: Before the procedure and under the influence of no sedative(s), amnesic(s), or anxiolytics, the patient was informed of the treatment options, risks and possible complications. To fulfill our ethical and legal obligations, as recommended by the American Medical Association's Code of Ethics, I have informed the patient of my clinical impression; the nature and purpose of the treatment or procedure; the risks, benefits, and possible complications of the intervention; the alternatives, including doing nothing; the risk(s) and benefit(s) of the alternative treatment(s) or procedure(s); and the risk(s) and benefit(s) of doing nothing. The patient was provided information about the general risks and possible complications associated with the procedure. These may include, but are not limited to: failure to achieve desired goals, infection, bleeding, organ or nerve damage, allergic reactions, paralysis, and death. In addition, the patient was informed of those risks and complications associated to Spine-related procedures, such as failure to decrease pain; infection (i.e.: Meningitis, epidural or intraspinal abscess); bleeding (i.e.: epidural hematoma, subarachnoid hemorrhage, or any other type of intraspinal or peri-dural bleeding); organ or nerve damage (i.e.: Any type of peripheral nerve, nerve root, or spinal cord injury) with subsequent damage to sensory, motor, and/or autonomic systems, resulting in permanent pain, numbness, and/or weakness of one or  several areas of the body; allergic reactions; (i.e.: anaphylactic reaction); and/or death. Furthermore, the patient was informed of those risks and complications associated with the medications. These include, but are not limited to: allergic reactions (i.e.: anaphylactic or anaphylactoid reaction(s)); adrenal axis suppression; blood sugar elevation that in diabetics may result in ketoacidosis or comma; water retention that in patients with history of congestive heart failure may result in shortness of breath, pulmonary edema, and  decompensation with resultant heart failure; weight gain; swelling or edema; medication-induced neural toxicity; particulate matter embolism and blood vessel occlusion with resultant organ, and/or nervous system infarction; and/or aseptic necrosis of one or more joints. Finally, the patient was informed that Medicine is not an exact science; therefore, there is also the possibility of unforeseen or unpredictable risks and/or possible complications that may result in a catastrophic outcome. The patient indicated having understood very clearly. We have given the patient no guarantees and we have made no promises. Enough time was given to the patient to ask questions, all of which were answered to the patient's satisfaction. Ms. Hooser has indicated that she wanted to continue with the procedure. Attestation: I, the ordering provider, attest that I have discussed with the patient the benefits, risks, side-effects, alternatives, likelihood of achieving goals, and potential problems during recovery for the procedure that I have provided informed consent. Date   Time: 09/02/2019 10:21 AM  Pre-Procedure Preparation:  Monitoring: As per clinic protocol. Respiration, ETCO2, SpO2, BP, heart rate and rhythm monitor placed and checked for adequate function Safety Precautions: Patient was assessed for positional comfort and pressure points before starting the procedure. Time-out: I initiated  and conducted the "Time-out" before starting the procedure, as per protocol. The patient was asked to participate by confirming the accuracy of the "Time Out" information. Verification of the correct person, site, and procedure were performed and confirmed by me, the nursing staff, and the patient. "Time-out" conducted as per Joint Commission's Universal Protocol (UP.01.01.01). Time: 1047  Description of Procedure:          Laterality: Left Levels:  L2, L3, L4, L5, & S1 Medial Branch Level(s), at the L3-4, L4-5, and the L5-S1 lumbar facet joints. Area Prepped: Lumbosacral Prepping solution: DuraPrep (Iodine Povacrylex [0.7% available iodine] and Isopropyl Alcohol, 74% w/w) Safety Precautions: Aspiration looking for blood return was conducted prior to all injections. At no point did we inject any substances, as a needle was being advanced. Before injecting, the patient was told to immediately notify me if she was experiencing any new onset of "ringing in the ears, or metallic taste in the mouth". No attempts were made at seeking any paresthesias. Safe injection practices and needle disposal techniques used. Medications properly checked for expiration dates. SDV (single dose vial) medications used. After the completion of the procedure, all disposable equipment used was discarded in the proper designated medical waste containers. Local Anesthesia: Protocol guidelines were followed. The patient was positioned over the fluoroscopy table. The area was prepped in the usual manner. The time-out was completed. The target area was identified using fluoroscopy. A 12-in long, straight, sterile hemostat was used with fluoroscopic guidance to locate the targets for each level blocked. Once located, the skin was marked with an approved surgical skin marker. Once all sites were marked, the skin (epidermis, dermis, and hypodermis), as well as deeper tissues (fat, connective tissue and muscle) were infiltrated with a small  amount of a short-acting local anesthetic, loaded on a 10cc syringe with a 25G, 1.5-in  Needle. An appropriate amount of time was allowed for local anesthetics to take effect before proceeding to the next step. Local Anesthetic: Lidocaine 2.0% The unused portion of the local anesthetic was discarded in the proper designated containers. Technical explanation of process:  Radiofrequency Ablation (RFA) L2 Medial Branch Nerve RFA: The target area for the L2 medial branch is at the junction of the postero-lateral aspect of the superior articular process and the superior, posterior, and  medial edge of the transverse process of L3. Under fluoroscopic guidance, a Radiofrequency needle was inserted until contact was made with os over the superior postero-lateral aspect of the pedicular shadow (target area). Sensory and motor testing was conducted to properly adjust the position of the needle. Once satisfactory placement of the needle was achieved, the numbing solution was slowly injected after negative aspiration for blood. 2.0 mL of the nerve block solution was injected without difficulty or complication. After waiting for at least 3 minutes, the ablation was performed. Once completed, the needle was removed intact. L3 Medial Branch Nerve RFA: The target area for the L3 medial branch is at the junction of the postero-lateral aspect of the superior articular process and the superior, posterior, and medial edge of the transverse process of L4. Under fluoroscopic guidance, a Radiofrequency needle was inserted until contact was made with os over the superior postero-lateral aspect of the pedicular shadow (target area). Sensory and motor testing was conducted to properly adjust the position of the needle. Once satisfactory placement of the needle was achieved, the numbing solution was slowly injected after negative aspiration for blood. 2.0 mL of the nerve block solution was injected without difficulty or complication.  After waiting for at least 3 minutes, the ablation was performed. Once completed, the needle was removed intact. L4 Medial Branch Nerve RFA: The target area for the L4 medial branch is at the junction of the postero-lateral aspect of the superior articular process and the superior, posterior, and medial edge of the transverse process of L5. Under fluoroscopic guidance, a Radiofrequency needle was inserted until contact was made with os over the superior postero-lateral aspect of the pedicular shadow (target area). Sensory and motor testing was conducted to properly adjust the position of the needle. Once satisfactory placement of the needle was achieved, the numbing solution was slowly injected after negative aspiration for blood. 2.0 mL of the nerve block solution was injected without difficulty or complication. After waiting for at least 3 minutes, the ablation was performed. Once completed, the needle was removed intact. L5 Medial Branch Nerve RFA: The target area for the L5 medial branch is at the junction of the postero-lateral aspect of the superior articular process of S1 and the superior, posterior, and medial edge of the sacral ala. Under fluoroscopic guidance, a Radiofrequency needle was inserted until contact was made with os over the superior postero-lateral aspect of the pedicular shadow (target area). Sensory and motor testing was conducted to properly adjust the position of the needle. Once satisfactory placement of the needle was achieved, the numbing solution was slowly injected after negative aspiration for blood. 2.0 mL of the nerve block solution was injected without difficulty or complication. After waiting for at least 3 minutes, the ablation was performed. Once completed, the needle was removed intact. S1 Medial Branch Nerve RFA: The target area for the S1 medial branch is located inferior to the junction of the S1 superior articular process and the L5 inferior articular process, posterior,  inferior, and lateral to the 6 o'clock position of the L5-S1 facet joint, just superior to the S1 posterior foramen. Under fluoroscopic guidance, the Radiofrequency needle was advanced until contact was made with os over the Target area. Sensory and motor testing was conducted to properly adjust the position of the needle. Once satisfactory placement of the needle was achieved, the numbing solution was slowly injected after negative aspiration for blood. 2.0 mL of the nerve block solution was injected without difficulty or complication. After  waiting for at least 3 minutes, the ablation was performed. Once completed, the needle was removed intact. Radiofrequency lesioning (ablation):  Radiofrequency Generator: NeuroTherm [id number] Sensory Stimulation Parameters: 50 Hz was used to locate & identify the nerve, making sure that the needle was positioned such that there was no sensory stimulation below 0.3 V or above 0.7 V. Motor Stimulation Parameters: 2 Hz was used to evaluate the motor component. Care was taken not to lesion any nerves that demonstrated motor stimulation of the lower extremities at an output of less than 2.5 times that of the sensory threshold, or a maximum of 2.0 V. Lesioning Technique Parameters: Standard Radiofrequency settings. (Not bipolar or pulsed.) Temperature Settings: 80 degrees C Lesioning time: 60 seconds Intra-operative Compliance: Poor intraoperative compliance and cooperation from the patient.  She persisted moving her head despite the fact that we reminded her in multiple locations not to do that.  This created a scenario where the contraction of the paravertebral muscles kept pulling the needles out of place.  I achieved best possible placement of the needles based on the feedback obtained from the patient.  However, despite the fact that we repetitively informed the patient what we were looking for, in terms of letting us know when she "first" felt any changes in the sensations  experienced in the lower back, we continued to have "false feedback" from the patient where she would tell us that she was "now" feeling the sensation when in fact we had not activated the testing stimulation.  At other times, she would tell us that she was feeling the stimulation and we would completely turn it off and we would asked the patient to tell us if she was still feeling and she would indicate that she was.  Because of how unreliable her feedback was, actually give it less than 50% chance that this would actually achieve adequate radiofrequency ablation.  For the most part, we attained placement of the needles based on anatomy and muscle stimulation and to a lesser extent on the feedback given by the patient. Materials & Medications: Needle(s) (Electrode/Cannula) Type: Teflon-coated, curved tip, Radiofrequency needle(s) Gauge: 20G Length: 15cm Numbing solution: 0.2% PF-Ropivacaine + Triamcinolone (40 mg/mL) diluted to a final concentration of 4 mg of Triamcinolone/mL of Ropivacaine The unused portion of the solution was discarded in the proper designated containers.  Once the entire procedure was completed, the treated area was cleaned, making sure to leave some of the prepping solution back to take advantage of its long term bactericidal properties.  Illustration of the posterior view of the lumbar spine and the posterior neural structures. Laminae of L2 through S1 are labeled. DPRL5, dorsal primary ramus of L5; DPRS1, dorsal primary ramus of S1; DPR3, dorsal primary ramus of L3; FJ, facet (zygapophyseal) joint L3-L4; I, inferior articular process of L4; LB1, lateral branch of dorsal primary ramus of L1; IAB, inferior articular branches from L3 medial branch (supplies L4-L5 facet joint); IBP, intermediate branch plexus; MB3, medial branch of dorsal primary ramus of L3; NR3, third lumbar nerve root; S, superior articular process of L5; SAB, superior articular branches from L4 (supplies L4-5 facet  joint also); TP3, transverse process of L3.  Vitals:   09/02/19 1135 09/02/19 1145 09/02/19 1155 09/02/19 1205  BP: 131/69 116/69 124/73 121/70  Pulse:    74  Resp: Temp:      SpO2: 100% 100% 100% 99%  Weight:      Height:  Start Time: 1047 hrs. End Time: 1129 hrs.  Imaging Guidance (Spinal):          Type of Imaging Technique: Fluoroscopy Guidance (Spinal) Indication(s): Assistance in needle guidance and placement for procedures requiring needle placement in or near specific anatomical locations not easily accessible without such assistance. Exposure Time: Please see nurses notes. Contrast: None used. Fluoroscopic Guidance: I was personally present during the use of fluoroscopy. "Tunnel Vision Technique" used to obtain the best possible view of the target area. Parallax error corrected before commencing the procedure. "Direction-depth-direction" technique used to introduce the needle under continuous pulsed fluoroscopy. Once target was reached, antero-posterior, oblique, and lateral fluoroscopic projection used confirm needle placement in all planes. Images permanently stored in EMR. Interpretation: No contrast injected. I personally interpreted the imaging intraoperatively. Adequate needle placement confirmed in multiple planes. Permanent images saved into the patient's record.  Antibiotic Prophylaxis:   Anti-infectives (From admission, onward)   None     Indication(s): None identified  Post-operative Assessment:  Post-procedure Vital Signs:  Pulse/HCG Rate: 7469 Temp: (!) 97.4 F (36.3 C) Resp: 15 BP: 121/70 SpO2: 99 %  EBL: None  Complications: No immediate post-treatment complications observed by team, or reported by patient.  Note: The patient tolerated the entire procedure well. A repeat set of vitals were taken after the procedure and the patient was kept under observation following institutional policy, for this type of procedure.  Post-procedural neurological assessment was performed, showing return to baseline, prior to discharge. The patient was provided with post-procedure discharge instructions, including a section on how to identify potential problems. Should any problems arise concerning this procedure, the patient was given instructions to immediately contact us, at any time, without hesitation. In any case, we plan to contact the patient by telephone for a follow-up status report regarding this interventional procedure.  Comments:  No additional relevant information.  Plan of Care  Orders:  Orders Placed This Encounter  Procedures   RFA - Lumbar Facet (Today)    Scheduling Instructions:     Side(s): Left-sided     Level: L3-4, L4-5, & L5-S1 Facets (L2, L3, L4, L5, & S1 Medial Branch Nerves)     Sedation: With Sedation     Timeframe: Today    Order Specific Question:   Where will this procedure be performed?    Answer:   [facility]   Fluoro (C-Arm) (<60 min) (No Report)    Intraoperative interpretation by procedural physician at [facility].    Standing Status:   Standing    Number of Occurrences:   1    Order Specific Question:   Reason for exam:    Answer:   Assistance in needle guidance and placement for procedures requiring needle placement in or near specific anatomical locations not easily accessible without such assistance.   Consent: L-FCT (RFA)    Nursing Order: Transcribe to consent form and obtain patient signature. Note: Always confirm laterality of pain with Ms. Santibanez, before procedure. Procedure: Lumbar Facet Radiofrequency Ablation Indication/Reason: Low Back Pain, with our without leg pain, due to Facet Joint Arthralgia (Joint Pain) known as Lumbar Facet Syndrome, secondary to Lumbar, and/or Lumbosacral Spondylosis (Arthritis of the Spine), without myelopathy or radiculopathy (Nerve Damage). Provider Attestation: I, Arleatha Philipps A. Laban Emperor, MD, (Pain Management  Specialist), the physician/practitioner, attest that I have discussed with the patient the benefits, risks, side effects, alternatives, likelihood of achieving goals and potential problems during recovery for the procedure that I have provided informed consent.  Radiofrequency Tray    Equipment required: Sterile "Radiofrequency Tray"; Large hemostat (1); Small hemostat (1); Towels (6-8); 4x4 sterile sponge pack (1) Radiofrequency Needle(s): Size: Long Quantity: 5    Standing Status:   Standing    Number of Occurrences:   1    Order Specific Question:   Specify    Answer:   Radiofrequency Tray   Chronic Opioid Analgesic:  No opioid analgesics from our practice.   Medications ordered for procedure: Meds ordered this encounter  Medications   lidocaine (XYLOCAINE) 2 % (with pres) injection 400 mg   lactated ringers infusion 1,000 mL   midazolam (VERSED) 5 MG/5ML injection 1-2 mg    Make sure Flumazenil is available in the pyxis when using this medication. If oversedation occurs, administer 0.2 mg IV over 15 sec. If after 45 sec no response, administer 0.2 mg again over 1 min; may repeat at 1 min intervals; not to exceed 4 doses (1 mg)   fentaNYL (SUBLIMAZE) injection 25-50 mcg    Make sure Narcan is available in the pyxis when using this medication. In the event of respiratory depression (RR< 8/min): Titrate NARCAN (naloxone) in increments of 0.1 to 0.2 mg IV at 2-3 minute intervals, until desired degree of reversal.   ropivacaine (PF) 2 mg/mL (0.2%) (NAROPIN) injection 9 mL   triamcinolone acetonide (KENALOG-40) injection 40 mg   HYDROcodone-acetaminophen (NORCO/VICODIN) 5-325 MG tablet    Sig: Take 1 tablet by mouth every 6 (six) hours as needed for up to 7 days for severe pain. Must last 7 days.    Dispense:  28 tablet    Refill:  0    For acute post-operative pain. Not to be refilled. Must last 7 days.   HYDROcodone-acetaminophen (NORCO/VICODIN) 5-325 MG tablet    Sig: Take 1  tablet by mouth every 6 (six) hours as needed for up to 7 days for severe pain. Must last 7 days.    Dispense:  28 tablet    Refill:  0    For acute post-operative pain. Not to be refilled.  Must last 7 days.   Medications administered: We administered lidocaine, lactated ringers, midazolam, fentaNYL, ropivacaine (PF) 2 mg/mL (0.2%), and triamcinolone acetonide.  See the medical record for exact dosing, route, and time of administration.  Follow-up plan:   Return in about 6 weeks (around 10/14/2019) for (VV), (PP).       Interventional treatment options:  Under consideration:   NOTE: NO further RFA attempts until she brings her BMI<35. (Technically difficult procedure due to intra-procedural non-compliance and very unreliable feedback.) Therapeutic left lumbar facet RFA #1 (today) Diagnostic bilateral SI joint blocks  Possible bilateral SI joint RFA  Diagnostic right L4 TFESI  Diagnostic right L3-4 LESI  Diagnostic bilateral genicular NB  Possible bilateral genicular nerve RFA    Therapeutic/palliative (PRN):   Diagnostic bilateral lumbar facet block #3  Palliative right lumbar facet RFA #2 (last done 08/07/2019) (Do not repeat until BMI<35) Palliative left lumbar facet RFA #2 (last done 09/02/2019) (Do not repeat until BMI<35)    Recent Visits Date Type Provider Dept  08/07/19 Procedure visit Delano Metz, MD [facility]  08/04/19 Telemedicine Delano Metz, MD [facility]  07/07/19 Office Visit Delano Metz, MD [facility]  06/12/19 Procedure visit Delano Metz, MD [facility]  Showing recent visits within past 90 days and meeting all other requirements   Today's Visits Date Type Provider Dept  09/02/19 Procedure visit Delano Metz, MD [facility]  Showing today's visits and meeting all other requirements   Future Appointments Date Type Provider Dept  10/15/19 Appointment Delano Metz, MD  [facility]  Showing future appointments within next 90 days and meeting all other requirements   Disposition: Discharge home  Discharge Date & Time: 09/02/2019; 1208 hrs.   Primary Care Physician: Galen Manila, NP (Inactive) Location: [facility] Note by: Oswaldo Done, MD Date: 09/02/2019; Time: 12:20 PM  Disclaimer:  Medicine is not an Visual merchandiser. The only guarantee in medicine is that nothing is guaranteed. It is important to note that the decision to proceed with this intervention was based on the information collected from the patient. The Data and conclusions were drawn from the patient's questionnaire, the interview, and the physical examination. Because the information was provided in large part by the patient, it cannot be guaranteed that it has not been purposely or unconsciously manipulated. Every effort has been made to obtain as much relevant data as possible for this evaluation. It is important to note that the conclusions that lead to this procedure are derived in large part from the available data. Always take into account that the treatment will also be dependent on availability of resources and existing treatment guidelines, considered by other Pain Management Practitioners as being common knowledge and practice, at the time of the intervention. For Medico-Legal purposes, it is also important to point out that variation in procedural techniques and pharmacological choices are the acceptable norm. The indications, contraindications, technique, and results of the above procedure should only be interpreted and judged by a Board-Certified Interventional Pain Specialist with extensive familiarity and expertise in the same exact procedure and technique.

## 2019-09-02 NOTE — Progress Notes (Signed)
Safety precautions to be maintained throughout the outpatient stay will include: orient to surroundings, keep bed in low position, maintain call bell within reach at all times, provide assistance with transfer out of bed and ambulation.  

## 2019-09-02 NOTE — Patient Instructions (Signed)

## 2019-09-03 ENCOUNTER — Telehealth: Payer: Self-pay

## 2019-09-03 ENCOUNTER — Telehealth: Payer: Self-pay | Admitting: *Deleted

## 2019-09-03 NOTE — Telephone Encounter (Signed)
Patient states that she was supposed to have med changes.  Informed her what Dr Dossie Arbour has ordered and instructions.  Instructed patient to call us back if she had any further questions or concerns.

## 2019-09-03 NOTE — Telephone Encounter (Signed)
Patient states that she had a headache when she got home. She ate and drank , took a nap and it went away. Instructed her to call if it come back or if she has any problems. I Also tod her that she could go to the ED if needed. Patient with understanding.

## 2019-09-04 ENCOUNTER — Telehealth: Payer: Self-pay | Admitting: Nurse Practitioner

## 2019-09-04 NOTE — Progress Notes (Signed)
Made in error

## 2019-09-22 ENCOUNTER — Ambulatory Visit: Payer: Self-pay | Admitting: *Deleted

## 2019-09-22 ENCOUNTER — Telehealth: Payer: Self-pay

## 2019-09-22 NOTE — Chronic Care Management (AMB) (Signed)
  Chronic Care Management   Outreach Note  09/22/2019 Name: Megan Perez MRN: [id number] DOB: September 15, 1946  Referred by: Galen Manila, NP (Inactive) Reason for referral : Chronic Care Management (Unsuccessful outreach)   A second unsuccessful telephone outreach was attempted today. The patient was referred to the case management team for assistance with care management and care coordination.   Follow Up Plan: A HIPPA compliant phone message was left for the patient providing contact information and requesting a return call.  The care management team will reach out to the patient again over the next 60 days.   Ma Rings [employer] RN, BSN Nurse Case Health and safety inspector [facility]  [phone]) Business Mobile

## 2019-09-23 ENCOUNTER — Telehealth: Payer: Self-pay | Admitting: Nurse Practitioner

## 2019-09-23 NOTE — Chronic Care Management (AMB) (Signed)
  Care Management   Note  09/23/2019 Name: [name] MRN: [id number] DOB: 05/10/1946  Megan Perez is a 74 y.o. year old female who is a primary care patient of Galen Manila, NP (Inactive) and is actively engaged with the care management team. I reached out to Megan Perez by phone today to assist with re-scheduling a initial appointment with the RN Case Manager   Follow up plan: Telephone appointment with care management team member scheduled for: 10/23/2019  [facility] Guide, Embedded Care Coordination [facility]  [place], [place] [address] Direct Dial: [phone] [email or web address]  Website: [email or web address]

## 2019-10-14 NOTE — Progress Notes (Signed)
Pain relief after procedure (treated area only): (Questions asked to patient) 1. Starting about 15 minutes after the procedure, and "while the area was still numb" (from the local anesthetics), were you having any of your usual pain "in that area" (the treated area)?  (NOTE: NOT including the discomfort from the needle sticks.) First 1 hour: 0  % better. First 4-6 hours: 0 % better. 2. How long did the numbness from the local anesthetics last? (More than 6 hours?) Duration: 0 hours.  3. How much better is your pain now, when compared to before the procedure? Current benefit: 50 % better. 4. Can you move better now? Improvement in ROM (Range of Motion): continues to have difficulty going up stairs. . 5. Can you do more now? Improvement in function: No. 4. Did you have any problems with the procedure? Side-effects/Complications: No.

## 2019-10-14 NOTE — Progress Notes (Signed)
Patient: Megan Perez  Service Category: E/M  Provider: Gaspar Cola, MD  DOB: 07-12-1946  DOS: 10/15/2019  Location: Office  MRN: [id number]  Setting: Ambulatory outpatient  Referring Provider: No ref. provider found  Type: Established Patient  Specialty: Interventional Pain Management  PCP: Mikey College, NP (Inactive)  Location: Remote location  Delivery: TeleHealth     Virtual Encounter - Pain Management PROVIDER NOTE: Information contained herein reflects review and annotations entered in association with encounter. Interpretation of such information and data should be left to medically-trained personnel. Information provided to patient can be located elsewhere in the medical record under "Patient Instructions". Document created using STT-dictation technology, any transcriptional errors that may result from process are unintentional.    Contact & Pharmacy Preferred: [phone] Home: [phone] (home) Mobile: [phone] (mobile) E-mail: [email or web address] [place], [address] [place] [address] Phone: [phone] Fax: [phone]   Pre-screening  Ms. Medal offered "in-person" vs "virtual" encounter. She indicated preferring virtual for this encounter.   Reason COVID-19*  Social distancing based on CDC and AMA recommendations.   I contacted Megan Perez on 10/15/2019 via telephone.      I clearly identified myself as Gaspar Cola, MD. I verified that I was speaking with the correct person using two identifiers (Name: JACOBY RITSEMA, and date of birth: 09-28-1945).  Consent I sought verbal advanced consent from Megan Perez for virtual visit interactions. I informed Ms. Colello of possible security and privacy concerns, risks, and limitations associated with providing "not-in-person" medical evaluation and management services. I also informed Ms. Casad of the  availability of "in-person" appointments. Finally, I informed her that there would be a charge for the virtual visit and that she could be  personally, fully or partially, financially responsible for it. Ms. Probert expressed understanding and agreed to proceed.   Historic Elements   Ms. AMMI HUTT is a 74 y.o. year old, female patient evaluated today after her last encounter by our practice on 09/03/2019. Ms. Ging  has a past medical history of Anxiety, Asthma, Bronchitis, GERD (gastroesophageal reflux disease), Hypertension, Morbid obesity with BMI of 40.0-44.9, adult ([place]) (04/08/2012), OA (osteoarthritis), Osteoporosis, Prediabetes, Sinus infection, and Venous (peripheral) insufficiency (08/19/2010). She also  has a past surgical history that includes Abdominal hysterectomy; Cholecystectomy; Replacement total knee; Heel spur surgery; Shoulder surgery (Left); Shoulder arthroscopy with open rotator cuff repair (Right, 09/15/2015); Replacement total knee (Right); Carpal tunnel release (Right); and Colonoscopy with propofol (N/A, 06/19/2017). Ms. Mccune has a current medication list which includes the following prescription(s): acetaminophen, aspirin ec, azelastine, b-d assure bpm/auto wrist cuff, calcium carbonate, diclofenac sodium, furosemide, lisinopril, sucralfate, tizanidine, and pregabalin. She  reports that she has never smoked. She has never used smokeless tobacco. She reports that she does not drink alcohol or use drugs. Ms. Fabio is allergic to baclofen; meclizine; other; penicillins; prednisone; prevacid [lansoprazole]; gabapentin; and oxycodone.   HPI  Today, she is being contacted for both, medication management and a post-procedure assessment.  During today's encounter we inquired about the patient's Lyrica titration.  According to the patient, she has been experiencing some constipation with the Lyrica.  However she describes that she takes "Prune-Lax", which is helping her with that  constipation.  She indicates that she gets that at [facility].  In reviewing the results of her radiofrequency ablation it turns out that the  report that she gave the nurse was erroneous since she actually did not have any pain in the affected area while the local anesthetic was working.  However, she did have pain in her hands and feet and this is what she reported back as not having had any benefit from the lumbar radiofrequency ablation.  Of course we would not be expecting any benefits in those areas from the lumbar radiofrequency.  I made that clear to the patient and now that she does have that clear what she reports back is very different.  She actually had 100% relief of the pain for the duration of the local anesthetic and she is currently not experiencing any pain in her left lower back.  She is having some discomfort in the groin area and when she stands for a prolonged period of time then she gets a little bit of pain in the very lower portion of the back/buttocks area.  Today I had the patient perform a Patrick maneuver at home and it would seem that this is coming from either her hip or the SI joint area.  Today I will be ordering some x-rays of those areas since the last x-rays that we have were from 2013.  In addition, today I took the time to explain in great detail how is it that she was supposed to take the Lyrica.  Unfortunately, she was taking it wrong.  She was taking it as needed rather than around-the-clock like she should have.  Not only that, but she did not realize that she had other prescriptions in the pharmacy and she did not get any of those filled.  However, she indicates that the Lyrica completely eliminated the stabbing pain that she was experiencing.  However, she did not realize that we were also prescribing this for the peripheral neuropathy and the burning sensation that she has in her feet.  Today I took the time to explain to her in great detail how to take the medication, how  long it should take for adequate blood levels to be reached, and what to do if she has any side effects to the medicine.  Today we agreed that once she has the x-rays done, she will call back to schedule another encounter or we can review those results and at that time we will also review how she is doing on the Lyrica as well.  Post-Procedure Evaluation  Procedure (09/02/2019): Therapeutic left lumbar facet RFA #1 under fluoroscopic guidance and IV sedation Pre-procedure pain level:  6/10 Post-procedure: 0/10 (100% relief)  Sedation: Sedation provided.  Pain relief after procedure (treated area only): (Questions asked to patient) 1. Starting about 15 minutes after the procedure, and "while the area was still numb" (from the local anesthetics), were you having any of your usual pain "in that area" (the treated area)?  (NOTE: NOT including the discomfort from the needle sticks.) First 1 hour: 100 % better. First 4-6 hours: 100 % better. 2. How long did the numbness from the local anesthetics last? (More than 6 hours?) Duration: ongoing.  3. How much better is your pain now, when compared to before the procedure? Current benefit: 100 % better.  She is experiencing complete relief of the pain in the treated area.  However, she is still having numbness in her hands and bilateral feet burning from her peripheral neuropathy, which is what she reported back to Ms. Jarold MottoPatterson when she called to do the assessment. 4. Can you move better now? Improvement in  ROM (Range of Motion): continues to have difficulty going up stairs.  This appears to be an issue secondary to her left hip pain, which today we have determined to be an ongoing factor causing groin pain on the left side. 5. Can you do more now? Improvement in function: Yes. 4. Did you have any problems with the procedure? Side-effects/Complications: Yes. Current benefits: Defined as benefit that persist at this time.   Analgesia:  90-100%  better Function: Ms. Droessler reports improvement in function.  However she still limited secondary to the issues with her groin pain in possibly hip and SI joint. ROM: Ms. Rieger reports improvement in ROM.  Same as above.  She has restriction in her range of motion for the hip, but her back has improved significantly.  Pharmacotherapy Assessment  Analgesic: No opioid analgesics from our practice.   Monitoring: Pharmacotherapy: No side-effects or adverse reactions reported. [place] PMP: PDMP reviewed during this encounter.       Compliance: No problems identified. Effectiveness: Clinically acceptable. Plan: Refer to "POC".  UDS:  Summary  Date Value Ref Range Status  11/05/2018 FINAL  Final    Comment:    ==================================================================== TOXASSURE COMP DRUG ANALYSIS,UR ==================================================================== Test                             Result       Flag       Units Drug Present and Declared for Prescription Verification   Acetaminophen                  PRESENT      EXPECTED Drug Absent but Declared for Prescription Verification   Baclofen                       Not Detected UNEXPECTED   Diclofenac                     Not Detected UNEXPECTED    Topical diclofenac, as indicated in the declared medication list,    is not always detected even when used as directed.   Salicylate                     Not Detected UNEXPECTED    Aspirin, as indicated in the declared medication list, is not    always detected even when used as directed. ==================================================================== Test                      Result    Flag   Units      Ref Range   Creatinine              93               mg/dL      >=57 ==================================================================== Declared Medications:  The flagging and interpretation on this report are based on the  following declared medications.  Unexpected  results may arise from  inaccuracies in the declared medications.  **Note: The testing scope of this panel includes these medications:  Baclofen  **Note: The testing scope of this panel does not include small to  moderate amounts of these reported medications:  Acetaminophen  Aspirin (Aspirin 81)  Topical Diclofenac  **Note: The testing scope of this panel does not include following  reported medications:  Azelastine  Furosemide  Lisinopril  Pantoprazole  Potassium  Pravastatin  Sucralfate ==================================================================== For  clinical consultation, please call [phone]. ====================================================================    Laboratory Chemistry Profile (12 mo)  Renal: 05/28/2019: BUN 19; BUN/Creatinine Ratio 18; Creat 1.03  Lab Results  Component Value Date   GFRAA 62 05/28/2019   GFRNONAA 54 (L) 05/28/2019   Hepatic: No results found for requested labs within last 8760 hours. Lab Results  Component Value Date   AST 11 05/28/2019   ALT 8 05/28/2019   Other: 11/05/2018: 25-Hydroxy, Vitamin D 13; 25-Hydroxy, Vitamin D-2 <1.0; 25-Hydroxy, Vitamin D-3 13; CRP 11; Sed Rate 30; Vitamin B-12 468  Note: Above Lab results reviewed.  Imaging  Fluoro (C-Arm) (<60 min) (No Report) Fluoro was used, but no Radiologist interpretation will be provided.  Please refer to "NOTES" tab for provider progress note.   Assessment  The primary encounter diagnosis was Chronic groin pain (Left). Diagnoses of Chronic hip pain (Left), Chronic sacroiliac joint pain (Left), Lumbar facet syndrome (Bilateral) (R>L), Lumbar (6 mm) Anterolisthesis of L4/L5, Chronic peripheral neuropathic pain, and Neurogenic pain were also pertinent to this visit.  Plan of Care  Problem-specific:  No problem-specific Assessment & Plan notes found for this encounter.  I have discontinued Tora A. Flock's pregabalin, pregabalin, and pregabalin. I am also having  her start on pregabalin. Additionally, I am having her maintain her aspirin EC, acetaminophen, azelastine, diclofenac sodium, sucralfate, B-D ASSURE BPM/AUTO WRIST CUFF, lisinopril, calcium carbonate, tiZANidine, and furosemide.  Pharmacotherapy (Medications Ordered): Meds ordered this encounter  Medications  . pregabalin (LYRICA) 25 MG capsule    Sig: Take 1 capsule (25 mg total) by mouth 3 (three) times daily.    Dispense:  90 capsule    Refill:  2    Fill one day early if pharmacy is closed on scheduled refill date. May substitute for generic if available.   Orders:  Orders Placed This Encounter  Procedures  . DG HIP UNILAT W OR W/O PELVIS 2-3 VIEWS LEFT    Standing Status:   Future    Standing Expiration Date:   10/14/2020    Scheduling Instructions:     Please describe any evidence of DJD, such as joint narrowing, asymmetry, cysts, or any anomalies in bone density, production, or erosion.    Order Specific Question:   Reason for Exam (SYMPTOM  OR DIAGNOSIS REQUIRED)    Answer:   Left hip pain/arthralgia    Order Specific Question:   Preferred imaging location?    Answer:   [facility]    Order Specific Question:   Call Results- Best Contact Number?    Answer:   [phone] (Pain Clinic facility) (Dr. Laban Emperor)  . DG Si Joints    Standing Status:   Future    Standing Expiration Date:   01/13/2020    Order Specific Question:   Reason for Exam (SYMPTOM  OR DIAGNOSIS REQUIRED)    Answer:   Left hip pain/arthralgia    Order Specific Question:   Preferred [facility] location?    [facility]    Order Specific Question:   Call Results- Best Contact Number?    Answer:   [phone] [facility])   Follow-up plan:   Return for (VV), (s/p Tests) to evaluate results of the hip and SI joint x-rays as well as the Lyrica trial.      Interventional treatment options:  Under consideration:   NOTE: NO further RFA attempts until she brings her BMI<35. (Technically  difficult procedure due to intra-procedural non-compliance and very unreliable feedback.) Therapeutic left  lumbar facet RFA #1 (today) Diagnostic bilateral SI joint blocks  Possible bilateral SI joint RFA  Diagnostic right L4 TFESI  Diagnostic right L3-4 LESI  Diagnostic bilateral genicular NB  Possible bilateral genicular nerve RFA    Therapeutic/palliative (PRN):   Diagnostic bilateral lumbar facet block #3  Palliative right lumbar facet RFA #2 (last done 08/07/2019) (Do not repeat until BMI<35) Palliative left lumbar facet RFA #2 (last done 09/02/2019) (Do not repeat until BMI<35)     Recent Visits Date Type Provider Dept  09/02/19 Procedure visit Delano Metz, MD [facility]  08/07/19 Procedure visit Delano Metz, MD [facility]  08/04/19 Telemedicine Delano Metz, MD [facility]  Showing recent visits within past 90 days and meeting all other requirements   Today's Visits Date Type Provider Dept  10/15/19 Telemedicine Delano Metz, MD [facility]  Showing today's visits and meeting all other requirements   Future Appointments Date Type Provider Dept  12/24/19 Appointment Delano Metz, MD [facility]  Showing future appointments within next 90 days and meeting all other requirements   I discussed the assessment and treatment plan with the patient. The patient was provided an opportunity to ask questions and all were answered. The patient agreed with the plan and demonstrated an understanding of the instructions.  Patient advised to call back or seek an in-person evaluation if the symptoms or condition worsens.  Duration of encounter: 30 minutes.  Note by: Oswaldo Done, MD Date: 10/15/2019; Time: 10:35 AM

## 2019-10-15 ENCOUNTER — Telehealth: Payer: Self-pay | Admitting: *Deleted

## 2019-10-15 ENCOUNTER — Ambulatory Visit: Payer: Medicare HMO | Attending: Pain Medicine | Admitting: Pain Medicine

## 2019-10-15 ENCOUNTER — Other Ambulatory Visit: Payer: Self-pay

## 2019-10-15 DIAGNOSIS — M4316 Spondylolisthesis, lumbar region: Secondary | ICD-10-CM | POA: Diagnosis not present

## 2019-10-15 DIAGNOSIS — M4310 Spondylolisthesis, site unspecified: Secondary | ICD-10-CM

## 2019-10-15 DIAGNOSIS — M25552 Pain in left hip: Secondary | ICD-10-CM | POA: Diagnosis not present

## 2019-10-15 DIAGNOSIS — G8929 Other chronic pain: Secondary | ICD-10-CM

## 2019-10-15 DIAGNOSIS — M47816 Spondylosis without myelopathy or radiculopathy, lumbar region: Secondary | ICD-10-CM | POA: Diagnosis not present

## 2019-10-15 DIAGNOSIS — M533 Sacrococcygeal disorders, not elsewhere classified: Secondary | ICD-10-CM

## 2019-10-15 DIAGNOSIS — R1032 Left lower quadrant pain: Secondary | ICD-10-CM | POA: Diagnosis not present

## 2019-10-15 DIAGNOSIS — M792 Neuralgia and neuritis, unspecified: Secondary | ICD-10-CM

## 2019-10-15 MED ORDER — PREGABALIN 25 MG PO CAPS: 25.0000 mg | ORAL_CAPSULE | Freq: Three times a day (TID) | ORAL | 2 refills | Status: DC

## 2019-10-23 ENCOUNTER — Ambulatory Visit: Payer: Self-pay | Admitting: General Practice

## 2019-10-23 ENCOUNTER — Telehealth: Payer: Medicare HMO

## 2019-10-23 NOTE — Chronic Care Management (AMB) (Signed)
  Chronic Care Management   Outreach Note  10/23/2019 Name: BRADY PLANT MRN: [id number] DOB: [birth year]  Referred by: Galen Manila, NP (Inactive) Reason for referral : Chronic Care Management (initial outreach: 3rd attempt)   An unsuccessful telephone outreach was attempted today. The patient was referred to the case management team by for assistance with care management and care coordination. This was the third attempt to reach the patient to engage in CCM.   Follow Up Plan: The care management team is available to follow up with the patient after provider conversation with the patient regarding recommendation for care management engagement and subsequent re-referral to the care management team.   Alto Denver RN, MSN, CCM Community Care Coordinator [facility]  [facility] Mobile: [phone]

## 2019-10-29 ENCOUNTER — Other Ambulatory Visit: Payer: Self-pay | Admitting: Family Medicine

## 2019-10-29 ENCOUNTER — Ambulatory Visit
Admission: RE | Admit: 2019-10-29 | Discharge: 2019-10-29 | Disposition: A | Discharge status: Home / Self Care | Payer: Medicare HMO | Source: Ambulatory Visit | Attending: Pain Medicine | Admitting: Pain Medicine

## 2019-10-29 ENCOUNTER — Other Ambulatory Visit: Payer: Self-pay

## 2019-10-29 DIAGNOSIS — R1032 Left lower quadrant pain: Secondary | ICD-10-CM | POA: Insufficient documentation

## 2019-10-29 DIAGNOSIS — I10 Essential (primary) hypertension: Secondary | ICD-10-CM

## 2019-10-29 DIAGNOSIS — M533 Sacrococcygeal disorders, not elsewhere classified: Secondary | ICD-10-CM | POA: Insufficient documentation

## 2019-10-29 DIAGNOSIS — G8929 Other chronic pain: Secondary | ICD-10-CM

## 2019-10-29 DIAGNOSIS — M25552 Pain in left hip: Secondary | ICD-10-CM | POA: Diagnosis not present

## 2019-10-29 DIAGNOSIS — M1612 Unilateral primary osteoarthritis, left hip: Secondary | ICD-10-CM | POA: Diagnosis not present

## 2019-10-30 MED ORDER — LISINOPRIL 20 MG PO TABS: 20.0000 mg | ORAL_TABLET | Freq: Every day | ORAL | 1 refills | Status: DC

## 2019-11-23 DIAGNOSIS — Z96653 Presence of artificial knee joint, bilateral: Secondary | ICD-10-CM | POA: Diagnosis not present

## 2019-11-23 DIAGNOSIS — M79672 Pain in left foot: Secondary | ICD-10-CM | POA: Diagnosis not present

## 2019-11-23 DIAGNOSIS — M25572 Pain in left ankle and joints of left foot: Secondary | ICD-10-CM | POA: Diagnosis not present

## 2019-11-23 DIAGNOSIS — M25472 Effusion, left ankle: Secondary | ICD-10-CM | POA: Diagnosis not present

## 2019-11-23 DIAGNOSIS — M79662 Pain in left lower leg: Secondary | ICD-10-CM | POA: Diagnosis not present

## 2019-11-23 DIAGNOSIS — M25552 Pain in left hip: Secondary | ICD-10-CM | POA: Diagnosis not present

## 2019-11-23 DIAGNOSIS — I10 Essential (primary) hypertension: Secondary | ICD-10-CM | POA: Diagnosis not present

## 2019-11-23 DIAGNOSIS — E669 Obesity, unspecified: Secondary | ICD-10-CM | POA: Diagnosis not present

## 2019-11-23 DIAGNOSIS — Z7982 Long term (current) use of aspirin: Secondary | ICD-10-CM | POA: Diagnosis not present

## 2019-11-23 DIAGNOSIS — S79912A Unspecified injury of left hip, initial encounter: Secondary | ICD-10-CM | POA: Diagnosis not present

## 2019-11-23 DIAGNOSIS — S93402A Sprain of unspecified ligament of left ankle, initial encounter: Secondary | ICD-10-CM | POA: Diagnosis not present

## 2019-12-04 ENCOUNTER — Telehealth: Payer: Self-pay | Admitting: Pain Medicine

## 2019-12-04 NOTE — Telephone Encounter (Signed)
All questions answered

## 2019-12-04 NOTE — Telephone Encounter (Signed)
[employer] Authorization [id number] calling in ref to [facility] [id number]  They need more information before they can approve pregabalin.  Per vmail at 3:02

## 2019-12-17 ENCOUNTER — Other Ambulatory Visit: Payer: Self-pay

## 2019-12-17 DIAGNOSIS — K219 Gastro-esophageal reflux disease without esophagitis: Secondary | ICD-10-CM

## 2019-12-17 DIAGNOSIS — R1084 Generalized abdominal pain: Secondary | ICD-10-CM

## 2019-12-18 MED ORDER — SUCRALFATE 1 G PO TABS: ORAL_TABLET | ORAL | 3 refills | Status: DC

## 2019-12-23 ENCOUNTER — Encounter: Payer: Self-pay | Admitting: Pain Medicine

## 2019-12-23 NOTE — Progress Notes (Signed)
Patient: Megan Perez  Service Category: E/M  Provider: Gaspar Cola, MD  DOB: [birth year]  DOS: 12/24/2019  Location: Office  MRN: [id number]  Setting: Ambulatory outpatient  Referring Provider: No ref. provider found  Type: Established Patient  Specialty: Interventional Pain Management  PCP: Olin Hauser, DO  Location: Remote location  Delivery: TeleHealth     Virtual Encounter - Pain Management PROVIDER NOTE: Information contained herein reflects review and annotations entered in association with encounter. Interpretation of such information and data should be left to medically-trained personnel. Information provided to patient can be located elsewhere in the medical record under "Patient Instructions". Document created using STT-dictation technology, any transcriptional errors that may result from process are unintentional.    Contact & Pharmacy Preferred: [phone] Home: [phone] (home) Mobile: [phone] (mobile) E-mail: [email or web address] [address] [place] [address] Phone: [phone] Fax: [phone]   Pre-screening  Megan Perez offered "in-person" vs "virtual" encounter. Megan Perez indicated preferring virtual for this encounter.   Reason COVID-19*  Social distancing based on CDC and AMA recommendations.   I contacted Megan Perez on 12/24/2019 via telephone.      I clearly identified myself as Gaspar Cola, MD. I verified that I was speaking with the correct person using two identifiers (Name: Megan Perez, and date of birth: Mar 30, 1946).  Consent I sought verbal advanced consent from Megan Perez for virtual visit interactions. I informed Megan Perez of possible security and privacy concerns, risks, and limitations associated with providing "not-in-person" medical evaluation and management services. I also informed Megan Perez of the availability of "in-person"  appointments. Finally, I informed her that there would be a charge for the virtual visit and that Megan Perez could be  personally, fully or partially, financially responsible for it. Megan Perez expressed understanding and agreed to proceed.   Historic Elements   Megan Perez is a 74 y.o. year old, female patient evaluated today after her last contact with our practice on 12/04/2019. Megan Perez  has a past medical history of Anxiety, Asthma, Bronchitis, GERD (gastroesophageal reflux disease), Hypertension, Morbid obesity with BMI of 40.0-44.9, adult ([place]) (04/08/2012), OA (osteoarthritis), Osteoporosis, Prediabetes, Sinus infection, and Venous (peripheral) insufficiency (08/19/2010). Megan Perez also  has a past surgical history that includes Abdominal hysterectomy; Cholecystectomy; Replacement total knee; Heel spur surgery; Shoulder surgery (Left); Shoulder arthroscopy with open rotator cuff repair (Right, 09/15/2015); Replacement total knee (Right); Carpal tunnel release (Right); and Colonoscopy with propofol (N/A, 06/19/2017). Megan Perez has a current medication list which includes the following prescription(s): acetaminophen, aspirin ec, azelastine, b-d assure bpm/auto wrist cuff, calcium carbonate, diclofenac sodium, furosemide, lisinopril, pregabalin, sucralfate, and tizanidine. Megan Perez  reports that Megan Perez has never smoked. Megan Perez has never used smokeless tobacco. Megan Perez reports that Megan Perez does not drink alcohol or use drugs. Megan Perez is allergic to baclofen; meclizine; other; penicillins; prednisone; prevacid [lansoprazole]; gabapentin; and oxycodone.   HPI  Today, Megan Perez is being contacted for medication management.  Today's encounter is to evaluate the patient's Lyrica titration. The patient indicates doing well with the current medication regimen. No adverse reactions or side effects reported to the medications.  The only problem that the patient had was at work and has been sent there pregabalin was at [employer].  Megan Perez indicates  that Megan Perez had pain $45 for the prescription.  I helped the patient look into the [employer] and we found that [facility] had  the same prescription for $17.10.  Today I have sent her prescription for the medication to the [facility] in [address]  Pharmacotherapy Assessment  Analgesic: No opioid analgesics from our practice.   Monitoring: [place] PMP: PDMP reviewed during this encounter.       Pharmacotherapy: No side-effects or adverse reactions reported. Compliance: No problems identified. Effectiveness: Clinically acceptable. Plan: Refer to "POC".  UDS:  Summary  Date Value Ref Range Status  11/05/2018 FINAL  Final    Comment:    ==================================================================== TOXASSURE COMP DRUG ANALYSIS,UR ==================================================================== Test                             Result       Flag       Units Drug Present and Declared for Prescription Verification   Acetaminophen                  PRESENT      EXPECTED Drug Absent but Declared for Prescription Verification   Baclofen                       Not Detected UNEXPECTED   Diclofenac                     Not Detected UNEXPECTED    Topical diclofenac, as indicated in the declared medication list,    is not always detected even when used as directed.   Salicylate                     Not Detected UNEXPECTED    Aspirin, as indicated in the declared medication list, is not    always detected even when used as directed. ==================================================================== Test                      Result    Flag   Units      Ref Range   Creatinine              93               mg/dL      >=20 ==================================================================== Declared Medications:  The flagging and interpretation on this report are based on the  following declared medications.  Unexpected results may arise from  inaccuracies in the declared  medications.  **Note: The testing scope of this panel includes these medications:  Baclofen  **Note: The testing scope of this panel does not include small to  moderate amounts of these reported medications:  Acetaminophen  Aspirin (Aspirin 81)  Topical Diclofenac  **Note: The testing scope of this panel does not include following  reported medications:  Azelastine  Furosemide  Lisinopril  Pantoprazole  Potassium  Pravastatin  Sucralfate ==================================================================== For clinical consultation, please call [phone]. ====================================================================    Laboratory Chemistry Profile   Renal Lab Results  Component Value Date   BUN 19 05/28/2019   CREATININE 1.03 (H) 05/28/2019   BCR 18 05/28/2019   GFRAA 62 05/28/2019   GFRNONAA 54 (L) 05/28/2019     Hepatic Lab Results  Component Value Date   AST 11 05/28/2019   ALT 8 05/28/2019   ALBUMIN 3.9 01/16/2017   ALKPHOS 73 01/16/2017   HCVAB NEGATIVE 02/16/2016     Electrolytes Lab Results  Component Value Date   NA 142 05/28/2019   K 5.1 05/28/2019   CL 105 05/28/2019  CALCIUM 9.7 05/28/2019   MG 2.1 11/05/2018     Bone Lab Results  Component Value Date   25OHVITD1 13 (L) 11/05/2018   25OHVITD2 <1.0 11/05/2018   25OHVITD3 13 11/05/2018     Inflammation (CRP: Acute Phase) (ESR: Chronic Phase) Lab Results  Component Value Date   CRP 11 (H) 11/05/2018   ESRSEDRATE 30 11/05/2018       Note: Above Lab results reviewed.  Imaging  DG Si Joints CLINICAL DATA:  Left hip pain, arthralgia  EXAM: BILATERAL SACROILIAC JOINTS - 3+ VIEW  COMPARISON:  Radiograph 12/04/2018  FINDINGS: Mild sclerotic degenerative changes are present of the SI joints, slightly more pronounced on the left. No visible bony fusion. Discogenic changes present in the spine. Minimal degenerative changes seen in both hips. Surgical clips in the pelvis.  Few phleboliths. Soft tissues are unremarkable.  IMPRESSION: Minimal degenerative changes of the SI joints, slightly more pronounced on the left. Not significantly changed from comparison.  Electronically Signed   By: Lovena Le M.D.   On: 10/29/2019 22:37 DG HIP UNILAT W OR W/O PELVIS 2-3 VIEWS LEFT CLINICAL DATA:  Left hip pain, feels 'catch' on left hip when standing  EXAM: DG HIP (WITH OR WITHOUT PELVIS) 2-3V LEFT  COMPARISON:  Radiographs Feb 01, 2012  FINDINGS: No acute fracture or traumatic malalignment. Proximal femora are normally seated within the acetabula. Mild bilateral hip arthrosis. Additional mild bilateral SI joint arthrosis and discogenic changes in the lumbar spine. Surgical clips noted in the deep pelvis. Few scattered phleboliths. Soft tissues are otherwise unremarkable.  IMPRESSION: No acute fracture or traumatic malalignment. Mild bilateral hip arthrosis.  Electronically Signed   By: Lovena Le M.D.   On: 10/29/2019 22:35  Assessment  The primary encounter diagnosis was Chronic low back pain (Primary Area of Pain) (Bilateral) (R>L) w/o sciatica. Diagnoses of Chronic peripheral neuropathic pain, Neurogenic pain, Chronic lower extremity pain (Secondary Area of Pain) (Right), Chronic knee pain (Tertiary Area of Pain) (Bilateral) (R>L), and Chronic pain syndrome were also pertinent to this visit.  Plan of Care  Problem-specific:  No problem-specific Assessment & Plan notes found for this encounter.  Megan Perez has a current medication list which includes the following long-term medication(s): calcium carbonate, furosemide, lisinopril, pregabalin, sucralfate, and tizanidine.  Pharmacotherapy (Medications Ordered): Meds ordered this encounter  Medications  . pregabalin (LYRICA) 25 MG capsule    Sig: Take 1 capsule (25 mg total) by mouth 3 (three) times daily.    Dispense:  90 capsule    Refill:  5    Fill one day early if pharmacy is  closed on scheduled refill date. May substitute for generic if available.   Orders:  No orders of the defined types were placed in this encounter.  Follow-up plan:   Return in about 3 months (around 03/22/2020) for (F2F), (MM).      Interventional treatment options:  Under consideration:   NOTE: NO further RFA attempts until Megan Perez brings her BMI<35. (Technically difficult procedure due to intra-procedural non-compliance and very unreliable feedback.) Therapeutic left lumbar facet RFA #1 (today) Diagnostic bilateral SI joint blocks  Possible bilateral SI joint RFA  Diagnostic right L4 TFESI  Diagnostic right L3-4 LESI  Diagnostic bilateral genicular NB  Possible bilateral genicular nerve RFA    Therapeutic/palliative (PRN):   Diagnostic bilateral lumbar facet block #3  Palliative right lumbar facet RFA #2 (last done 08/07/2019) (Do not repeat until BMI<35) Palliative left lumbar facet RFA #2 (last  done 09/02/2019) (Do not repeat until BMI<35)      Recent Visits Date Type Provider Dept  10/15/19 Telemedicine Milinda Pointer, MD [facility]  Showing recent visits within past 90 days and meeting all other requirements   Today's Visits Date Type Provider Dept  12/24/19 Telemedicine Milinda Pointer, MD [facility]  Showing today's visits and meeting all other requirements   Future Appointments No visits were found meeting these conditions.  Showing future appointments within next 90 days and meeting all other requirements   I discussed the assessment and treatment plan with the patient. The patient was provided an opportunity to ask questions and all were answered. The patient agreed with the plan and demonstrated an understanding of the instructions.  Patient advised to call back or seek an in-person evaluation if the symptoms or condition worsens.  Duration of encounter: 15 minutes.  Note by: Gaspar Cola, MD Date: 12/24/2019; Time: 8:34 AM

## 2019-12-24 ENCOUNTER — Other Ambulatory Visit: Payer: Self-pay

## 2019-12-24 ENCOUNTER — Ambulatory Visit: Payer: Medicare HMO | Attending: Pain Medicine | Admitting: Pain Medicine

## 2019-12-24 DIAGNOSIS — M545 Low back pain, unspecified: Secondary | ICD-10-CM

## 2019-12-24 DIAGNOSIS — M25561 Pain in right knee: Secondary | ICD-10-CM

## 2019-12-24 DIAGNOSIS — M25562 Pain in left knee: Secondary | ICD-10-CM

## 2019-12-24 DIAGNOSIS — G894 Chronic pain syndrome: Secondary | ICD-10-CM

## 2019-12-24 DIAGNOSIS — G8929 Other chronic pain: Secondary | ICD-10-CM

## 2019-12-24 DIAGNOSIS — M79604 Pain in right leg: Secondary | ICD-10-CM

## 2019-12-24 DIAGNOSIS — M792 Neuralgia and neuritis, unspecified: Secondary | ICD-10-CM

## 2019-12-24 MED ORDER — PREGABALIN 25 MG PO CAPS: 25.0000 mg | ORAL_CAPSULE | Freq: Three times a day (TID) | ORAL | 5 refills | Status: DC

## 2020-01-21 ENCOUNTER — Other Ambulatory Visit: Payer: Self-pay | Admitting: Family Medicine

## 2020-01-21 DIAGNOSIS — I10 Essential (primary) hypertension: Secondary | ICD-10-CM

## 2020-01-21 DIAGNOSIS — R7303 Prediabetes: Secondary | ICD-10-CM

## 2020-01-21 DIAGNOSIS — Z6841 Body Mass Index (BMI) 40.0 and over, adult: Secondary | ICD-10-CM

## 2020-01-23 ENCOUNTER — Telehealth: Payer: Self-pay | Admitting: Family Medicine

## 2020-01-23 NOTE — Chronic Care Management (AMB) (Signed)
  Chronic Care Management   Note  01/23/2020 Name: RAENAH MURLEY MRN: [id number] DOB: 29-Sep-1945  Naida Sleight is a 74 y.o. year old female who is a primary care patient of Olin Hauser, DO. I reached out to Naida Sleight by phone today in response to a referral sent by Ms. Khala A Haslip's PCP, Nobie Putnam DO     Ms. Foglesong was given information about Chronic Care Management services today including:  1. CCM service includes personalized support from designated clinical staff supervised by her physician, including individualized plan of care and coordination with other care providers 2. 24/7 contact phone numbers for assistance for urgent and routine care needs. 3. Service will only be billed when office clinical staff spend 20 minutes or more in a month to coordinate care. 4. Only one practitioner may furnish and bill the service in a calendar month. 5. The patient may stop CCM services at any time (effective at the end of the month) by phone call to the office staff. 6. The patient will be responsible for cost sharing (co-pay) of up to 20% of the service fee (after annual deductible is met).  Patient agreed to services and verbal consent obtained.   Follow up plan: Telephone appointment with care management team member scheduled for:02/13/2020  Glenna Durand, [place], [place] [facility] ??[email or web address] ??[phone]

## 2020-02-13 ENCOUNTER — Ambulatory Visit: Payer: Medicare HMO | Admitting: Pharmacist

## 2020-02-13 DIAGNOSIS — E782 Mixed hyperlipidemia: Secondary | ICD-10-CM

## 2020-02-13 NOTE — Chronic Care Management (AMB) (Signed)
Chronic Care Management   Follow Up Note   02/13/2020 Name: Megan Perez MRN: [id number] DOB: Jul 31, 1946  Referred by: Olin Hauser, DO Reason for referral : Chronic Care Management (Patient Phone Call)   Megan Perez is a 74 y.o. year old female who is a primary care patient of Olin Hauser, DO. The CCM team was consulted for assistance with chronic disease management and care coordination needs.    I reached out to Megan Perez by phone today.   Review of patient status, including review of consultants reports, relevant laboratory and other test results, and collaboration with appropriate care team members and the patient's provider was performed as part of comprehensive patient evaluation and provision of chronic care management services.    Objective  Lab Results  Component Value Date   CHOL 189 05/28/2019   HDL 60 05/28/2019   LDLCALC 114 (H) 05/28/2019   TRIG 66 05/28/2019   CHOLHDL 3.2 05/28/2019    Outpatient Encounter Medications as of 02/13/2020  Medication Sig  . furosemide (LASIX) 40 MG tablet Take 1 tablet (40 mg total) by mouth every other day.  . lisinopril (ZESTRIL) 20 MG tablet Take 1 tablet (20 mg total) by mouth daily.  [name] acetaminophen (TYLENOL) 500 MG tablet Take 1,300 mg by mouth every 6 (six) hours as needed.   [name] aspirin EC 81 MG tablet Take 81 mg by mouth daily.  [name] azelastine (OPTIVAR) 0.05 % ophthalmic solution INT 1 GTT INTO OU BID FOR 10 DAYS  . Blood Pressure Monitoring (B-D ASSURE BPM/AUTO WRIST CUFF) MISC 1 Device by Does not apply route daily.  . calcium carbonate (CALCIUM 600) 600 MG TABS tablet Take 1 tablet (600 mg total) by mouth daily.  . diclofenac sodium (VOLTAREN) 1 % GEL APPLY 2 GRAMS TOPICALLY FOUR TIMES DAILY AS NEEDED FOR MODERATE PAIN OF BACK AND KNEES  . pregabalin (LYRICA) 25 MG capsule Take 1 capsule (25 mg total) by mouth 3 (three) times daily.  . sucralfate (CARAFATE) 1 g tablet TAKE 1 TABLET(1 GRAM) BY  MOUTH FOUR TIMES DAILY AT BEDTIME WITH MEALS AS NEEDED  . tiZANidine (ZANAFLEX) 2 MG tablet Take 2 tablets (4 mg total) by mouth at bedtime. May also take 1 tablet (2 mg total) daily as needed for muscle spasms.   No facility-administered encounter medications on file as of 02/13/2020.    Goals Addressed            This Visit's Progress   . PharmD - Medication Adherence       CARE PLAN ENTRY (see longitudinal plan of care for additional care plan information)   Current Barriers:  . Need for evaluation of medication adherence, as identified by patient's health plan, based on medication adherence quality data  Pharmacist Clinical Goal(s):  [name] Over the next 1 day, patient will work with CM Pharmacist and PCP to address needs related to medication adherence  Interventions: . Inter-disciplinary care team collaboration (see longitudinal plan of care) . Assess adherence to pravastatin (as identified by health plan) o Based on review of chart and confirmed by patient report, patient no longer taking pravastatin due to side effect of dizziness . Discuss with patient importance of medication adherence. Patient confirms using weekly pillbox as adherence aid. Denies any barriers to adherence at this time. . Encourage patient in her plan to call to schedule appointment with PCP. Reports has had numbness in her feet for about a month. Confirms has phone number for  clinic  . Denies any current medication questions or concerns.  Patient Self Care Activities:  . Calls provider office for new concerns or questions  Initial goal documentation        Plan  1) Patient denies any further pharmacy needs at this time. 2) The patient has been provided with contact information for CM Pharmacist and has been advised to call with any medication related questions or concerns.   Duanne Moron, PharmD, [facility] Clinical Pharmacist [facility] Medical [employer] [phone]

## 2020-02-13 NOTE — Patient Instructions (Signed)
Thank you allowing the Chronic Care Management Team to be a part of your care! It was a pleasure speaking with you today!     CCM (Chronic Care Management) Team    Alto Denver RN, MSN, CCM Nurse Care Coordinator  [phone]   Duanne Moron PharmD  Clinical Pharmacist  [phone]   Dickie La LCSW Clinical Social Worker [phone]  Visit Information  Goals Addressed            This Visit's Progress   . PharmD - Medication Adherence       CARE PLAN ENTRY (see longitudinal plan of care for additional care plan information)   Current Barriers:  . Need for evaluation of medication adherence, as identified by patient's health plan, based on medication adherence quality data  Pharmacist Clinical Goal(s):  [name] Over the next 1 day, patient will work with CM Pharmacist and PCP to address needs related to medication adherence  Interventions: . Inter-disciplinary care team collaboration (see longitudinal plan of care) . Assess adherence to pravastatin (as identified by health plan) o Based on review of chart and confirmed by patient report, patient no longer taking pravastatin due to side effect of dizziness . Discuss with patient importance of medication adherence. Patient confirms using weekly pillbox as adherence aid. Denies any barriers to adherence at this time. . Encourage patient in her plan to call to schedule appointment with PCP. Reports has had numbness in her feet for about a month. Confirms has phone number for clinic  . Denies any current medication questions or concerns.  Patient Self Care Activities:  . Calls provider office for new concerns or questions  Initial goal documentation        Patient verbalizes understanding of instructions provided today.   The patient has been provided with contact information for the care management team and has been advised to call with any health related questions or concerns.   Duanne Moron, PharmD,  [facility] Clinical Pharmacist [facility] Medical [employer] [phone]

## 2020-02-27 ENCOUNTER — Other Ambulatory Visit: Payer: Self-pay

## 2020-02-27 ENCOUNTER — Other Ambulatory Visit: Payer: Self-pay | Admitting: Family Medicine

## 2020-02-27 ENCOUNTER — Ambulatory Visit (INDEPENDENT_AMBULATORY_CARE_PROVIDER_SITE_OTHER): Payer: Medicare HMO | Admitting: Family Medicine

## 2020-02-27 ENCOUNTER — Encounter: Payer: Self-pay | Admitting: Family Medicine

## 2020-02-27 VITALS — BP 116/63 | HR 90 | Temp 97.1°F | Resp 16 | Ht 67.0 in | Wt 272.6 lb

## 2020-02-27 DIAGNOSIS — Z6841 Body Mass Index (BMI) 40.0 and over, adult: Secondary | ICD-10-CM | POA: Diagnosis not present

## 2020-02-27 DIAGNOSIS — G720 Drug-induced myopathy: Secondary | ICD-10-CM | POA: Diagnosis not present

## 2020-02-27 DIAGNOSIS — I890 Lymphedema, not elsewhere classified: Secondary | ICD-10-CM | POA: Diagnosis not present

## 2020-02-27 DIAGNOSIS — F5104 Psychophysiologic insomnia: Secondary | ICD-10-CM

## 2020-02-27 DIAGNOSIS — E7800 Pure hypercholesterolemia, unspecified: Secondary | ICD-10-CM

## 2020-02-27 DIAGNOSIS — I10 Essential (primary) hypertension: Secondary | ICD-10-CM

## 2020-02-27 DIAGNOSIS — I872 Venous insufficiency (chronic) (peripheral): Secondary | ICD-10-CM | POA: Diagnosis not present

## 2020-02-27 DIAGNOSIS — R7303 Prediabetes: Secondary | ICD-10-CM

## 2020-02-27 NOTE — Patient Instructions (Addendum)
Thank you for coming to the office today.  [facility] - stay tuned for appointment  [facility], [place], [place] [address]  Main: [phone]   STOP Furosemide 40mg  every other day.  You should have less dizziness when you stand off of the fluid pill, and once they treat the swelling.  Keep trying to limit salt and stay active to help the circulation.  Switch the Pregabalin Lyric to one pill at BEDTIME to help rest.  Future can consider another sleep med if need.  ---------------  Sleep Hygiene Recommendations to promote healthy sleep in all patients, especially if symptoms of insomnia are worsening. Due to the nature of sleep rhythms, if your body gets "out of rhythm", it may take some time before your sleep cycle can be "reset".  Please try to follow as many of the following tips as you can, usually there are only a few of these are the primary cause of the problem.  ?To reset your sleep rhythm, go to bed and get up at the same time every day ?Sleep only long enough to feel rested and then get out of bed ?Do not try to force yourself to sleep. If you can't sleep, get out of bed and try again later. ?Avoid naps during the day, unless excessively tired. The more sleeping during the day, then the less sleep your body needs at night.  ?Have coffee, tea, and other foods that have caffeine only in the morning ?Exercise several days a week, but not right before bed ?If you drink alcohol, prefer to have appropriate drink with one meal, but prefer to avoid alcohol in the evening, and bedtime ?If you smoke, avoid smoking, especially in the evening  ?Avoid watching TV or looking at phones, computers, or reading devices ("e-books") that give off light at least 30 minutes before bed. This artificial light sends "awake signals" to your brain and can make it harder to fall asleep. ?Make your bedroom a comfortable place where it is easy to fall  asleep: ? Put up shades or special blackout curtains to block light from outside. ? Use a white noise machine to block noise. ? Keep the temperature cool. ?Try your best to solve or at least address your problems before you go to bed ?Use relaxation techniques to manage stress. Ask your health care provider to suggest some techniques that may work well for you. These may include: ? Breathing exercises. ? Routines to release muscle tension. ? Visualizing peaceful scenes.  DUE for FASTING BLOOD WORK (no food or drink after midnight before the lab appointment, only water or coffee without cream/sugar on the morning of)  SCHEDULE "Lab Only" visit in the morning at the clinic for lab draw in 3 MONTHS   - Make sure Lab Only appointment is at about 1 week before your next appointment, so that results will be available  For Lab Results, once available within 2-3 days of blood draw, you can can log in to MyChart online to view your results and a brief explanation. Also, we can discuss results at next follow-up visit.    Please schedule a Follow-up Appointment to: Return in about 3 months (around 05/29/2020) for Yearly Medicare Check up.  If you have any other questions or concerns, please feel free to call the office or send a message through [place]. You may also schedule an earlier appointment if necessary.  Additionally, you may be receiving a survey about your experience at  our office within a few days to 1 week by e-mail or mail. We value your feedback.  Saralyn Pilar, DO [facility], [place]

## 2020-02-27 NOTE — Progress Notes (Signed)
Subjective:    Patient ID: Megan Perez, female    DOB: [birth year], 74 y.o.   MRN: [id number]  Megan Perez is a 74 y.o. female presenting on 02/27/2020 for Joint Swelling (light HA--dizzy) and Insomnia   HPI   Chronic Lower Extremity Swelling / Venous Insufficiency vs Lymphedema Chronic problem, chart review since 2018+ prior PCP managing her edema. In 2018 was referred to [facility] had stress test and cardiac work-up that was unremarkable. Also had ECHO that showed Grade 1 diastolic dysfunction otherwise no cause of swelling. Renal function had been stable  Today now has persistent edema. Furosemide 40mg  QOD not helping. Admits dizziness at times standing up suddenly. She had cramps if took daily. She dose admit good UOP.  Denies any chest pain, dyspnea at rest, coughing wheezing, arm pain, chest pressure, near syncope or lightheadedness   Insomnia Difficult to explain. Says some worry or anxiety can keep her up. Naps at 2pm daily for 45 min. Not following sleep hygiene. Failed Melatonin side effect with joint pain or groin pain. She takes Pregabalin low dose daily AM makes her groggy or sleepy  Drug Induced Myopathy History of statin intolerance was on Atorvastatin 20mg  in past, failed due to myalgia myopathy   Depression screen Megan Perez 2/9 05/06/2019 03/04/2019 11/05/2018  Decreased Interest 0 0 0  Down, Depressed, Hopeless 0 0 0  PHQ - 2 Score 0 0 0  Altered sleeping - - 0  Tired, decreased energy - - 3  Change in appetite - - 1  Feeling bad or failure about yourself  - - 0  Trouble concentrating - - 0  Moving slowly or fidgety/restless - - 1  Suicidal thoughts - - 0  PHQ-9 Score - - 5    Social History   Tobacco Use  . Smoking status: Never Smoker  . Smokeless tobacco: Never Used  Vaping Use  . Vaping Use: Never used  Substance Use Topics  . Alcohol use: No  . Drug use: No    Review of Systems Per HPI unless specifically  indicated above     Objective:    BP 116/63   Pulse 90   Temp (!) 97.1 F (36.2 C) (Temporal)   Resp 16   Ht 5\' 7"  (1.702 m)   Wt 272 lb 9.6 oz (123.7 kg)   SpO2 97%   BMI 42.70 kg/m   Wt Readings from Last 3 Encounters:  02/27/20 272 lb 9.6 oz (123.7 kg)  09/02/19 268 lb (121.6 kg)  08/07/19 268 lb (121.6 kg)    Physical Exam Vitals and nursing note reviewed.  Constitutional:      General: She is not in acute distress.    Appearance: She is well-developed. She is obese. She is not diaphoretic.     Comments: Well-appearing, comfortable, cooperative  HENT:     Head: Normocephalic and atraumatic.  Eyes:     General:        Right eye: No discharge.        Left eye: No discharge.     Conjunctiva/sclera: Conjunctivae normal.  Neck:     Thyroid: No thyromegaly.  Cardiovascular:     Rate and Rhythm: Normal rate and regular rhythm.     Heart sounds: Normal heart sounds. No murmur heard.   Pulmonary:     Effort: Pulmonary effort is normal. No respiratory distress.     Breath sounds: Normal breath sounds. No wheezing or rales.  Musculoskeletal:        General: Normal range of motion.     Cervical back: Normal range of motion and neck supple.     Right lower leg: Edema (+3 non pitting edema) present.     Left lower leg: Edema (+3 non pitting edema) present.  Lymphadenopathy:     Cervical: No cervical adenopathy.  Skin:    General: Skin is warm and dry.     Findings: No erythema or rash.  Neurological:     Mental Status: She is alert and oriented to person, place, and time.  Psychiatric:        Behavior: Behavior normal.     Comments: Well groomed, good eye contact, normal speech and thoughts        Results for orders placed or performed in visit on 05/28/19  Hemoglobin A1c  Result Value Ref Range   Hgb A1c MFr Bld 6.1 (H) <5.7 % of total Hgb   Mean Plasma Glucose 128 (calc)   eAG (mmol/L) 7.1 (calc)  Lipid panel  Result Value Ref Range   Cholesterol 189 <200  mg/dL   HDL 60 > OR = 50 mg/dL   Triglycerides 66 <150 mg/dL   LDL Cholesterol (Calc) 114 (H) mg/dL (calc)   Total CHOL/HDL Ratio 3.2 <5.0 (calc)   Non-HDL Cholesterol (Calc) 129 <130 mg/dL (calc)  COMPLETE METABOLIC PANEL WITH GFR  Result Value Ref Range   Glucose, Bld 98 65 - 99 mg/dL   BUN 19 7 - 25 mg/dL   Creat 1.03 (H) 0.60 - 0.93 mg/dL   GFR, Est Non African American 54 (L) > OR = 60 mL/min/1.42m2   GFR, Est African American 62 > OR = 60 mL/min/1.55m2   BUN/Creatinine Ratio 18 6 - 22 (calc)   Sodium 142 135 - 146 mmol/L   Potassium 5.1 3.5 - 5.3 mmol/L   Chloride 105 98 - 110 mmol/L   CO2 27 20 - 32 mmol/L   Calcium 9.7 8.6 - 10.4 mg/dL   Total Protein 6.8 6.1 - 8.1 g/dL   Albumin 4.0 3.6 - 5.1 g/dL   Globulin 2.8 1.9 - 3.7 g/dL (calc)   AG Ratio 1.4 1.0 - 2.5 (calc)   Total Bilirubin 0.3 0.2 - 1.2 mg/dL   Alkaline phosphatase (APISO) 52 37 - 153 U/L   AST 11 10 - 35 U/L   ALT 8 6 - 29 U/L      Assessment & Plan:   Problem List Items Addressed This Visit    Psychophysiological insomnia   Morbid obesity with BMI of 40.0-44.9, adult (HCC)   Lymphedema of both lower extremities - Primary   Relevant Orders   Ambulatory referral to Vascular Surgery   Drug-induced myopathy   Chronic venous insufficiency   Relevant Orders   Ambulatory referral to Vascular Surgery     # Drug induced myopathy, Statin intolerance Remains off statin due to myalgia leg cramp and aches due to Atorvastatin previously.   #Lymphedema bilateral LE Venous insufficiency chronic Morbid obesity BMI >42  Clinically with chronic severe non pitting edema both lower extremity persistent and worsening problem over past several years, previous PCP had done some work up - ultimately thought to not be renal or cardiac related, prior ECHO reviewed from Megan Perez only grade 1 diastolic dysfunction 8657 - Has limited sodium, no improvement - Limited improvement on Furosemide even  higher dose increase now, she has good UOP but no improved edema - More  sedentary, morbid obesity risk factors worsening  Plan DISCONTINUE Furosemide 40mg  QOD - given symptoms of postural dizziness, likely causing or contributing to her symptoms. Not helping her edema.  Limit sodium, RICE therapy. Compression and elevation advised.  Weight loss  - Refer to Vascular, [facility] for further consultation and work up with doppler imaging and reflux study and likely good candidate to pursue lymphedema pump and advanced compression if indicated.  #Insomnia Seems primarily related to psychological/anxiety worry, no other symptoms seem to cause it, also has some poor sleep hygiene. Nap during day and difficulty with temperature in room, if too cold causes joint pain. - Handout on sleep hygiene given - failed melatonin, cannot try this again - Limited available sleep medications due to potential side effects, already has dizziness  Today will CHANGE the admin of her Pregabalin Lyrica that causes her to be sleepy / drowsy, instead of taking in AM she can take in PM to help rest. - May consider SSRI in future to help with anxiety - age 42 caution with other sleep aid but could consider low dose Ambien in future if indicated   Orders Placed This Encounter  Procedures  . Ambulatory referral to Vascular Surgery    Referral Priority:   Routine    Referral Type:   Surgical    Referral Reason:   Specialty Services Required    Requested Specialty:   Vascular Surgery    Number of Visits Requested:   1    No orders of the defined types were placed in this encounter.   Follow up plan: Return in about 3 months (around 05/29/2020) for Yearly Medicare Check up.  Future labs ordered for 05/2020  06/2020, DO [facility] 02/27/2020, 3:13 PM

## 2020-03-11 DIAGNOSIS — H02882 Meibomian gland dysfunction right lower eyelid: Secondary | ICD-10-CM | POA: Diagnosis not present

## 2020-03-11 DIAGNOSIS — H2513 Age-related nuclear cataract, bilateral: Secondary | ICD-10-CM | POA: Diagnosis not present

## 2020-03-11 DIAGNOSIS — H35033 Hypertensive retinopathy, bilateral: Secondary | ICD-10-CM | POA: Diagnosis not present

## 2020-03-11 DIAGNOSIS — H02885 Meibomian gland dysfunction left lower eyelid: Secondary | ICD-10-CM | POA: Diagnosis not present

## 2020-03-12 ENCOUNTER — Ambulatory Visit (INDEPENDENT_AMBULATORY_CARE_PROVIDER_SITE_OTHER): Payer: Medicare HMO | Admitting: Vascular Surgery

## 2020-03-12 ENCOUNTER — Other Ambulatory Visit: Payer: Self-pay

## 2020-03-12 ENCOUNTER — Encounter (INDEPENDENT_AMBULATORY_CARE_PROVIDER_SITE_OTHER): Payer: Self-pay | Admitting: Vascular Surgery

## 2020-03-12 VITALS — BP 160/86 | HR 72 | Resp 17 | Ht 67.0 in | Wt 282.0 lb

## 2020-03-12 DIAGNOSIS — I890 Lymphedema, not elsewhere classified: Secondary | ICD-10-CM | POA: Diagnosis not present

## 2020-03-12 DIAGNOSIS — I10 Essential (primary) hypertension: Secondary | ICD-10-CM

## 2020-03-12 DIAGNOSIS — M7989 Other specified soft tissue disorders: Secondary | ICD-10-CM

## 2020-03-12 NOTE — Assessment & Plan Note (Signed)
blood pressure control important in reducing the progression of atherosclerotic disease. On appropriate oral medications.  

## 2020-03-12 NOTE — Progress Notes (Signed)
Patient ID: Megan Perez, female   DOB: 02-May-1946, 74 y.o.   MRN: [id number]  Chief Complaint  Patient presents with  . New Patient (Initial Visit)    Edema    HPI Megan Perez is a 74 y.o. female.  I am asked to see the patient by Dr. Althea Charon for evaluation of leg swelling.  The patient has been bothered by mild leg swelling for a long time, but 2 or 3 months ago the swelling became very pronounced.  She now can no longer wear compression stockings as getting these on and off is near impossible and very painful.  Both legs are swelling but the left leg is the more severely affected of the 2 legs.  She has had knee replacements in both legs.  She was previously on diuretics but this was not improving the situation.  Elevation has not really helped.  She can no longer wear the compression socks.  She denies fevers or chills.  No open ulcerations or infection.  No previous history of DVT or superficial thrombophlebitis to her knowledge.     Past Medical History:  Diagnosis Date  . Anxiety   . Asthma    reactive airway and shortness of breath w/ exertion in hot temperatures  . Bronchitis   . GERD (gastroesophageal reflux disease)   . Hypertension   . Morbid obesity with BMI of 40.0-44.9, adult (HCC) 04/08/2012  . OA (osteoarthritis)    knee  . Osteoporosis   . Prediabetes   . Sinus infection    10/2018  . Venous (peripheral) insufficiency 08/19/2010   Qualifier: Diagnosis of  By: [name], [place]      Past Surgical History:  Procedure Laterality Date  . ABDOMINAL HYSTERECTOMY    . CARPAL TUNNEL RELEASE Right   . CHOLECYSTECTOMY    . COLONOSCOPY WITH PROPOFOL N/A 06/19/2017   Procedure: COLONOSCOPY WITH PROPOFOL;  Surgeon: [name], MD;  Location: [facility] ENDOSCOPY;  Service: Endoscopy;  Laterality: N/A;  . HEEL SPUR SURGERY    . REPLACEMENT TOTAL KNEE     left  . REPLACEMENT TOTAL KNEE Right   . SHOULDER ARTHROSCOPY WITH OPEN ROTATOR CUFF REPAIR Right 09/15/2015    Procedure: SHOULDER ARTHROSCOPY WITH OPEN ROTATOR CUFF REPAIR;  Surgeon: [name], MD;  Location: [facility] ORS;  Service: Orthopedics;  Laterality: Right;  . SHOULDER SURGERY Left      Family History  Adopted: Yes  Problem Relation Age of Onset  . Heart disease Father   . Subarachnoid hemorrhage Mother   . Hypertension Mother   . Subarachnoid hemorrhage Sister   . Hypertension Sister   . Hypertension Sister   . Hypertension Sister   . Healthy Sister   . Breast cancer Neg Hx       Social History   Tobacco Use  . Smoking status: Never Smoker  . Smokeless tobacco: Never Used  Vaping Use  . Vaping Use: Never used  Substance Use Topics  . Alcohol use: No  . Drug use: No     Allergies  Allergen Reactions  . Baclofen     Makes her feel Drunk  . Meclizine   . Other     Other reaction(s): Other (See Comments) Patient notes there is a pain medication that causes hot flushing sensation in her mouth but cannot recall its name.  [name] Penicillins   . Prednisone Nausea And Vomiting  . Prevacid [Lansoprazole]     Causes dizziness  . Gabapentin Perez  and Itching  . Oxycodone Rash    Current Outpatient Medications  Medication Sig Dispense Refill  . acetaminophen (TYLENOL) 500 MG tablet Take 1,300 mg by mouth every 6 (six) hours as needed.     [name] aspirin EC 81 MG tablet Take 81 mg by mouth daily.    [name] azelastine (OPTIVAR) 0.05 % ophthalmic solution INT 1 GTT INTO OU BID FOR 10 DAYS    . Blood Pressure Monitoring (B-D ASSURE BPM/AUTO WRIST CUFF) MISC 1 Device by Does not apply route daily. 1 each 0  . calcium carbonate (CALCIUM 600) 600 MG TABS tablet Take 1 tablet (600 mg total) by mouth daily. 90 tablet 3  . diclofenac sodium (VOLTAREN) 1 % GEL APPLY 2 GRAMS TOPICALLY FOUR TIMES DAILY AS NEEDED FOR MODERATE PAIN OF BACK AND KNEES 100 g 1  . lisinopril (ZESTRIL) 20 MG tablet Take 1 tablet (20 mg total) by mouth daily. 90 tablet 1  . pregabalin (LYRICA) 25 MG capsule Take 1  capsule (25 mg total) by mouth 3 (three) times daily. (Patient taking differently: Take 25 mg by mouth daily. Not taking three times a day making her sleepy--now taking once --was taking to prevent spasm in her legs) 90 capsule 5  . sucralfate (CARAFATE) 1 g tablet TAKE 1 TABLET(1 GRAM) BY MOUTH FOUR TIMES DAILY AT BEDTIME WITH MEALS AS NEEDED 60 tablet 3   No current facility-administered medications for this visit.      REVIEW OF SYSTEMS (Negative unless checked)  Constitutional: [] Weight loss  [] Fever  [] Chills Cardiac: [] Chest pain   [] Chest pressure   [] Palpitations   [] Shortness of breath when laying flat   [] Shortness of breath at rest   [] Shortness of breath with exertion. Vascular:  [x] Pain in legs with walking   [x] Pain in legs at rest   [] Pain in legs when laying flat   [] Claudication   [] Pain in feet when walking  [] Pain in feet at rest  [] Pain in feet when laying flat   [] History of DVT   [] Phlebitis   [x] Swelling in legs   [] Varicose veins   [] Non-healing ulcers Pulmonary:   [] Uses home oxygen   [] Productive cough   [] Hemoptysis   [] Wheeze  [] COPD   [] Asthma Neurologic:  [] Dizziness  [] Blackouts   [] Seizures   [] History of stroke   [] History of TIA  [] Aphasia   [] Temporary blindness   [] Dysphagia   [] Weakness or numbness in arms   [x] Weakness or numbness in legs Musculoskeletal:  [x] Arthritis   [] Joint swelling   [x] Joint pain   [] Low back pain Hematologic:  [] Easy bruising  [] Easy bleeding   [] Hypercoagulable state   [] Anemic  [] Hepatitis Gastrointestinal:  [] Blood in stool   [] Vomiting blood  [] Gastroesophageal reflux/heartburn   [] Abdominal pain Genitourinary:  [] Chronic kidney disease   [] Difficult urination  [] Frequent urination  [] Burning with urination   [] Hematuria Skin:  [] Rashes   [] Ulcers   [] Wounds Psychological:  [] History of anxiety   []  History of major depression.    Physical Exam BP (!) 160/86 (BP Location: Right Arm)   Pulse 72   Resp 17   Ht 5\' 7"  (1.702  m)   Wt 282 lb (127.9 kg)   BMI 44.17 kg/m  Gen:  WD/WN, NAD.  Appears younger than stated age Head: [place]/AT, No temporalis wasting.  Ear/Nose/Throat: Hearing grossly intact, nares w/o erythema or drainage, oropharynx w/o Erythema/Exudate Eyes: Conjunctiva clear, sclera non-icteric  Neck: trachea midline.  No JVD.  Pulmonary:  Good air  movement, respirations not labored, no use of accessory muscles  Cardiac: RRR, no JVD Vascular:  Vessel Right Left  Radial Palpable Palpable                          DP  1+  trace  PT  1+  not palpable   Gastrointestinal:. No masses, surgical incisions, or scars. Musculoskeletal: M/S 5/5 throughout.  Extremities without ischemic changes.  No deformity or atrophy.  2-3+ right lower extremity edema, 3+ left lower extremity edema. Neurologic: Sensation grossly intact in extremities.  Symmetrical.  Speech is fluent. Motor exam as listed above. Psychiatric: Judgment intact, Mood & affect appropriate for pt's clinical situation. Dermatologic: No rashes or ulcers noted.  No cellulitis or open wounds.    Radiology No results found.  Labs No results found for this or any previous visit (from the past 2160 hour(s)).  Assessment/Plan:  Essential hypertension blood pressure control important in reducing the progression of atherosclerotic disease. On appropriate oral medications.   Lymphedema of both lower extremities The patient clearly has a component of lymphedema from chronic scarring and lymphatic channels.  Both legs are quite swollen but the left leg is the worst.  This seems to have gotten worse after her surgery for knee replacements.  Work-up as outlined below.  This would be stage II-III lymphedema.  Is refractory to compression and elevation at this point.  Swelling of limb I have had a long discussion with the patient regarding swelling and why it  causes symptoms.  Her swelling is too pronounced to currently get compression stockings on,  so we are going to wrap both legs and 3 layer Unna boots today.  The Unna boots will be changed weekly.  This was done on each leg and will be done for 2 to 3 weeks.  Once she is out of Unna boots, and the swelling is under better control, patient will begin wearing graduated compression stockings class 1 (20-30 mmHg) on a daily basis a prescription was given. The patient will  beginning wearing the stockings first thing in the morning and removing them in the evening. The patient is instructed specifically not to sleep in the stockings.   In addition, behavioral modification will be initiated.  This will include frequent elevation, use of over the counter pain medications and exercise such as walking.  I have reviewed systemic causes for chronic edema such as liver, kidney and cardiac etiologies.  The patient denies problems with these organ systems.    Consideration for a lymph pump will also be made based upon the effectiveness of conservative therapy.  This would help to improve the edema control and prevent sequela such as ulcers and infections   Patient should undergo duplex ultrasound of the venous system to ensure that DVT or reflux is not present.  The patient will follow-up with me after the ultrasound.        Leotis Pain 03/12/2020, 11:21 AM   This note was created with Dragon medical transcription system.  Any errors from dictation are unintentional.

## 2020-03-12 NOTE — Assessment & Plan Note (Signed)
The patient clearly has a component of lymphedema from chronic scarring and lymphatic channels.  Both legs are quite swollen but the left leg is the worst.  This seems to have gotten worse after her surgery for knee replacements.  Work-up as outlined below.  This would be stage II-III lymphedema.  Is refractory to compression and elevation at this point.

## 2020-03-12 NOTE — Assessment & Plan Note (Signed)
I have had a long discussion with the patient regarding swelling and why it  causes symptoms.  Her swelling is too pronounced to currently get compression stockings on, so we are going to wrap both legs and 3 layer Unna boots today.  The Unna boots will be changed weekly.  This was done on each leg and will be done for 2 to 3 weeks.  Once she is out of Unna boots, and the swelling is under better control, patient will begin wearing graduated compression stockings class 1 (20-30 mmHg) on a daily basis a prescription was given. The patient will  beginning wearing the stockings first thing in the morning and removing them in the evening. The patient is instructed specifically not to sleep in the stockings.   In addition, behavioral modification will be initiated.  This will include frequent elevation, use of over the counter pain medications and exercise such as walking.  I have reviewed systemic causes for chronic edema such as liver, kidney and cardiac etiologies.  The patient denies problems with these organ systems.    Consideration for a lymph pump will also be made based upon the effectiveness of conservative therapy.  This would help to improve the edema control and prevent sequela such as ulcers and infections   Patient should undergo duplex ultrasound of the venous system to ensure that DVT or reflux is not present.  The patient will follow-up with me after the ultrasound.

## 2020-03-12 NOTE — Patient Instructions (Signed)

## 2020-03-17 NOTE — Progress Notes (Signed)
PROVIDER NOTE: Information contained herein reflects review and annotations entered in association with encounter. Interpretation of such information and data should be left to medically-trained personnel. Information provided to patient can be located elsewhere in the medical record under "Patient Instructions". Document created using STT-dictation technology, any transcriptional errors that may result from process are unintentional.    Patient: Megan Perez  Service Category: E/M  Provider: Gaspar Cola, MD  DOB: [birth year]  DOS: 03/18/2020  Specialty: Interventional Pain Management  MRN: [id number]  Setting: Ambulatory outpatient  PCP: Olin Hauser, DO  Type: Established Patient    Referring Provider: Nobie Putnam *  Location: Office  Delivery: Face-to-face     HPI  Reason for encounter: [name], a 74 y.o. year old female, is here today for evaluation and management of her Chronic pain syndrome [G89.4]. Ms. Helvie primary complain today is Back Pain (low) Last encounter: Practice (12/04/2019). My last encounter with her was on 12/04/2019. Pertinent problems: Ms. Vankirk has Arthritis; History of total knee replacement (Right); Degenerative spondylolisthesis; Full thickness rotator cuff tear; Impingement syndrome of shoulder region; Osteoarthrosis, localized, primary, knee; Rotator cuff rupture, complete; Chronic low back pain (Primary area of Pain) (Bilateral) (R>L) w/ sciatica (Right); Chronic lower extremity pain (Secondary Area of Pain) (Right); Chronic knee pain (Tertiary Area of Pain) (Bilateral) (R>L); Chronic pain syndrome; History of knee replacement, total (Left); Chronic knee pain after total knee replacement (Bilateral); Chronic sacroiliac joint pain (Bilateral) (R>L); Lumbar facet syndrome (Bilateral) (R>L); Lumbar (6 mm) Anterolisthesis of L4/L5; DDD (degenerative disc disease), lumbosacral; Spondylosis without myelopathy or radiculopathy, lumbosacral  region; Chronic low back pain (Primary Area of Pain) (Bilateral) (R>L) w/o sciatica; Chronic peripheral neuropathic pain; Neurogenic pain; Chronic lumbar radiculitis (L5) (Right); Chronic musculoskeletal pain; Chronic hip pain (Left); Chronic sacroiliac joint pain (Left); Chronic groin pain (Left); and Muscle spasms of both lower extremities on their pertinent problem list. Pain Assessment: Severity of Chronic pain is reported as a 0-No pain/10. Location: Back Lower/denies. Onset: More than a month ago. Quality: Aching. Timing: Constant. Modifying factor(s): rest. Vitals:  height is 5' 9"  (1.753 m) and weight is 275 lb (124.7 kg). Her temporal temperature is 97.2 F (36.2 C) (abnormal). Her blood pressure is 144/74 (abnormal) and her pulse is 83. Her respiration is 18 and oxygen saturation is 98%.   Today we made some changes to her medications, due to side effects.  The first change we made was with regards to the Lyrica 25 mg which we were prescribing for the patient to have 3 times a day.  Unfortunately, she was unable to tolerate that due to the fact that it made her very sleepy.  In addition she also had a prescription for tizanidine (Zanaflex) to take 2 mg p.o. 3 times daily as needed for muscle aches.  Again, the patient refers that she has been very sleepy with her medications and therefore I have also changed this 1 to 2 mg at bedtime.  During our conversation, it came up that she has been having difficulty sleeping at night and that she has discussed this with her primary care physician.  I took the opportunity to go over appropriate sleep hygiene with the patient.  She was instructed to always wake up in the morning at the same time, always go to bed at night at the same time, never to take naps during the day, to avoid the use of any stimulants after 6 PM, to avoid taking any type of  medications that will increase her energy levels at bedtime, to avoid the use of computers and smart phones after 6  PM to avoid exposure to "blue light".  She was also instructed to take the Lyrica and the tizanidine at bedtime, both of which should help her sleep better at that time.  From the medical standpoint, the patient's condition has been worsening and she has significant lower extremity lymphedema.  Today she comes in with both legs wrapped and she indicates that she has had several changes on her medications and that those wraps have helped her swelling go down.  Today the patient seems to have some anasarca since she had swelling not only in the legs but everywhere.  At this point she is really not a good candidate for any interventional therapy and I am really not prescribing any controlled substances for her except for the Lyrica.  Today I talked to the patient about her medications and I have decided to go ahead and transfer her medication management for the pregabalin and the tizanidine to her primary care physician.  She seems to be doing well with the Lyrica 25 mg p.o. at bedtime and the tizanidine 2 mg p.o. at bedtime.  I recommend that she stays on that unless she later develops any more medical problems that may require that the medications be stopped.  For the time being, I think that the patient will be better served by having her primary care physician manage all of her medicines and medical conditions so that we do not end up stepping on each other's toes.  The plan of care was shared with the patient who agreed and accepted.  Pharmacotherapy Assessment   Analgesic: No opioid analgesics from our practice.   Monitoring: [place] PMP: PDMP reviewed during this encounter.       Pharmacotherapy: No side-effects or adverse reactions reported. Compliance: No problems identified. Effectiveness: Clinically acceptable.  Dewayne Shorter, RN  03/18/2020 12:25 PM  Signed Safety precautions to be maintained throughout the outpatient stay will include: orient to surroundings, keep bed in low position, maintain call  bell within reach at all times, provide assistance with transfer out of bed and ambulation.    UDS:  Summary  Date Value Ref Range Status  11/05/2018 FINAL  Final    Comment:    ==================================================================== [facility] COMP DRUG ANALYSIS,UR ==================================================================== Test                             Result       Flag       Units Drug Present and Declared for Prescription Verification   Acetaminophen                  PRESENT      EXPECTED Drug Absent but Declared for Prescription Verification   Baclofen                       Not Detected UNEXPECTED   Diclofenac                     Not Detected UNEXPECTED    Topical diclofenac, as indicated in the declared medication list,    is not always detected even when used as directed.   Salicylate                     Not Detected UNEXPECTED    Aspirin, as  indicated in the declared medication list, is not    always detected even when used as directed. ==================================================================== Test                      Result    Flag   Units      Ref Range   Creatinine              93               mg/dL      >=20 ==================================================================== Declared Medications:  The flagging and interpretation on this report are based on the  following declared medications.  Unexpected results may arise from  inaccuracies in the declared medications.  **Note: The testing scope of this panel includes these medications:  Baclofen  **Note: The testing scope of this panel does not include small to  moderate amounts of these reported medications:  Acetaminophen  Aspirin (Aspirin 81)  Topical Diclofenac  **Note: The testing scope of this panel does not include following  reported medications:  Azelastine  Furosemide  Lisinopril  Pantoprazole  Potassium  Pravastatin   Sucralfate ==================================================================== For clinical consultation, please call [phone]. ====================================================================      ROS  Constitutional: Denies any fever or chills Gastrointestinal: No reported hemesis, hematochezia, vomiting, or acute GI distress Musculoskeletal: Denies any acute onset joint swelling, redness, loss of ROM, or weakness Neurological: No reported episodes of acute onset apraxia, aphasia, dysarthria, agnosia, amnesia, paralysis, loss of coordination, or loss of consciousness  Medication Review  B-D ASSURE BPM/AUTO WRIST CUFF, acetaminophen, aspirin EC, azelastine, calcium carbonate, diclofenac sodium, lisinopril, pregabalin, sucralfate, and tiZANidine  History Review  Allergy: Ms. Mckeel is allergic to baclofen, meclizine, other, penicillins, prednisone, prevacid [lansoprazole], gabapentin, and oxycodone. Drug: Ms. Schussler  reports no history of drug use. Alcohol:  reports no history of alcohol use. Tobacco:  reports that she has never smoked. She has never used smokeless tobacco. Social: Ms. Mastrogiovanni  reports that she has never smoked. She has never used smokeless tobacco. She reports that she does not drink alcohol and does not use drugs. Medical:  has a past medical history of Anxiety, Asthma, Bronchitis, GERD (gastroesophageal reflux disease), Hypertension, Morbid obesity with BMI of 40.0-44.9, adult ([place]) (04/08/2012), OA (osteoarthritis), Osteoporosis, Prediabetes, Sinus infection, and Venous (peripheral) insufficiency (08/19/2010). Surgical: Ms. Lusignan  has a past surgical history that includes Abdominal hysterectomy; Cholecystectomy; Replacement total knee; Heel spur surgery; Shoulder surgery (Left); Shoulder arthroscopy with open rotator cuff repair (Right, 09/15/2015); Replacement total knee (Right); Carpal tunnel release (Right); and Colonoscopy with propofol (N/A,  06/19/2017). Family: family history includes Healthy in her sister; Heart disease in her father; Hypertension in her mother, sister, sister, and sister; Subarachnoid hemorrhage in her mother and sister. She was adopted.  Laboratory Chemistry Profile   Renal Lab Results  Component Value Date   BUN 19 05/28/2019   CREATININE 1.03 (H) 05/28/2019   BCR 18 05/28/2019   GFRAA 62 05/28/2019   GFRNONAA 54 (L) 05/28/2019     Hepatic Lab Results  Component Value Date   AST 11 05/28/2019   ALT 8 05/28/2019   ALBUMIN 3.9 01/16/2017   ALKPHOS 73 01/16/2017   HCVAB NEGATIVE 02/16/2016     Electrolytes Lab Results  Component Value Date   NA 142 05/28/2019   K 5.1 05/28/2019   CL 105 05/28/2019   CALCIUM 9.7 05/28/2019   MG 2.1 11/05/2018     Bone Lab Results  Component  Value Date   25OHVITD1 13 (L) 11/05/2018   25OHVITD2 <1.0 11/05/2018   25OHVITD3 13 11/05/2018     Inflammation (CRP: Acute Phase) (ESR: Chronic Phase) Lab Results  Component Value Date   CRP 11 (H) 11/05/2018   ESRSEDRATE 30 11/05/2018       Note: Above Lab results reviewed.  Recent Imaging Review  DG Si Joints CLINICAL DATA:  Left hip pain, arthralgia  EXAM: BILATERAL SACROILIAC JOINTS - 3+ VIEW  COMPARISON:  Radiograph 12/04/2018  FINDINGS: Mild sclerotic degenerative changes are present of the SI joints, slightly more pronounced on the left. No visible bony fusion. Discogenic changes present in the spine. Minimal degenerative changes seen in both hips. Surgical clips in the pelvis. Few phleboliths. Soft tissues are unremarkable.  IMPRESSION: Minimal degenerative changes of the SI joints, slightly more pronounced on the left. Not significantly changed from comparison.  Electronically Signed   By: Lovena Le M.D.   On: 10/29/2019 22:37 DG HIP UNILAT W OR W/O PELVIS 2-3 VIEWS LEFT CLINICAL DATA:  Left hip pain, feels 'catch' on left hip when standing  EXAM: DG HIP (WITH OR WITHOUT  PELVIS) 2-3V LEFT  COMPARISON:  Radiographs Feb 01, 2012  FINDINGS: No acute fracture or traumatic malalignment. Proximal femora are normally seated within the acetabula. Mild bilateral hip arthrosis. Additional mild bilateral SI joint arthrosis and discogenic changes in the lumbar spine. Surgical clips noted in the deep pelvis. Few scattered phleboliths. Soft tissues are otherwise unremarkable.  IMPRESSION: No acute fracture or traumatic malalignment. Mild bilateral hip arthrosis.  Electronically Signed   By: Lovena Le M.D.   On: 10/29/2019 22:35 Note: Reviewed        Physical Exam  General appearance: Well nourished, well developed, and well hydrated. In no apparent acute distress Mental status: Alert, oriented x 3 (person, place, & time)       Respiratory: No evidence of acute respiratory distress Eyes: PERLA Vitals: BP (!) 144/74 (BP Location: Left Arm, Patient Position: Sitting, Cuff Size: Normal)   Pulse 83   Temp (!) 97.2 F (36.2 C) (Temporal)   Resp 18   Ht 5' 9"  (1.753 m)   Wt 275 lb (124.7 kg)   SpO2 98%   BMI 40.61 kg/m  BMI: Estimated body mass index is 40.61 kg/m as calculated from the following:   Height as of this encounter: 5' 9"  (1.753 m).   Weight as of this encounter: 275 lb (124.7 kg). Ideal: Ideal body weight: 66.2 kg (145 lb 15.1 oz) Adjusted ideal body weight: 89.6 kg (197 lb 9.1 oz)  Assessment   Status Diagnosis  Controlled Controlled Controlled 1. Chronic pain syndrome   2. Muscle spasms of both lower extremities   3. Chronic musculoskeletal pain   4. Chronic low back pain (Primary Area of Pain) (Bilateral) (R>L) w/o sciatica   5. Chronic lower extremity pain (Secondary Area of Pain) (Right)   6. Pharmacologic therapy   7. Chronic peripheral neuropathic pain   8. Neurogenic pain      Updated Problems: Problem  Muscle Spasms of Both Lower Extremities    Plan of Care  Problem-specific:  No problem-specific Assessment & Plan  notes found for this encounter.  Ms. FALICITY SHEETS has a current medication list which includes the following long-term medication(s): calcium carbonate, lisinopril, sucralfate, pregabalin, and tizanidine.  Pharmacotherapy (Medications Ordered): Meds ordered this encounter  Medications  . tiZANidine (ZANAFLEX) 2 MG tablet    Sig: Take 1 tablet (2 mg  total) by mouth at bedtime.    Dispense:  30 tablet    Refill:  2    Do not place this medication, or any other prescription from our practice, on "Automatic Refill". Patient may have prescription filled one day early if pharmacy is closed on scheduled refill date.  . pregabalin (LYRICA) 25 MG capsule    Sig: Take 1 capsule (25 mg total) by mouth at bedtime.    Dispense:  30 capsule    Refill:  2    Fill one day early if pharmacy is closed on scheduled refill date. May substitute for generic if available.   Orders:  No orders of the defined types were placed in this encounter.  Follow-up plan:   Return if symptoms worsen or fail to improve.      Interventional treatment options:  Under consideration:   NOTE: NO further RFA attempts until she brings her BMI<35. (Technically difficult procedure due to intra-procedural non-compliance and very unreliable feedback.) Therapeutic left lumbar facet RFA #1 (today) Diagnostic bilateral SI joint blocks  Possible bilateral SI joint RFA  Diagnostic right L4 TFESI  Diagnostic right L3-4 LESI  Diagnostic bilateral genicular NB  Possible bilateral genicular nerve RFA    Therapeutic/palliative (PRN):   Diagnostic bilateral lumbar facet block #3  Palliative right lumbar facet RFA #2 (last done 08/07/2019) (Do not repeat until BMI<35) Palliative left lumbar facet RFA #2 (last done 09/02/2019) (Do not repeat until BMI<35)       Recent Visits Date Type Provider Dept  12/24/19 Telemedicine Milinda Pointer, MD [facility]  Showing recent visits within past 90 days and meeting all other  requirements Today's Visits Date Type Provider Dept  03/18/20 Office Visit Milinda Pointer, MD [facility]  Showing today's visits and meeting all other requirements Future Appointments No visits were found meeting these conditions. Showing future appointments within next 90 days and meeting all other requirements  I discussed the assessment and treatment plan with the patient. The patient was provided an opportunity to ask questions and all were answered. The patient agreed with the plan and demonstrated an understanding of the instructions.  Patient advised to call back or seek an in-person evaluation if the symptoms or condition worsens.  Duration of encounter: 30 minutes.  Note by: Gaspar Cola, MD Date: 03/18/2020; Time: 3:05 PM

## 2020-03-18 ENCOUNTER — Ambulatory Visit (INDEPENDENT_AMBULATORY_CARE_PROVIDER_SITE_OTHER): Payer: Medicare HMO | Admitting: Nurse Practitioner

## 2020-03-18 ENCOUNTER — Encounter: Payer: Self-pay | Admitting: Pain Medicine

## 2020-03-18 ENCOUNTER — Ambulatory Visit: Payer: Medicare HMO | Attending: Pain Medicine | Admitting: Pain Medicine

## 2020-03-18 ENCOUNTER — Other Ambulatory Visit: Payer: Self-pay

## 2020-03-18 VITALS — BP 144/74 | HR 83 | Temp 97.2°F | Resp 18 | Ht 69.0 in | Wt 275.0 lb

## 2020-03-18 VITALS — BP 128/82 | HR 90 | Resp 18 | Ht 69.0 in | Wt 276.0 lb

## 2020-03-18 DIAGNOSIS — G894 Chronic pain syndrome: Secondary | ICD-10-CM

## 2020-03-18 DIAGNOSIS — I890 Lymphedema, not elsewhere classified: Secondary | ICD-10-CM

## 2020-03-18 DIAGNOSIS — M7918 Myalgia, other site: Secondary | ICD-10-CM | POA: Insufficient documentation

## 2020-03-18 DIAGNOSIS — M545 Low back pain: Secondary | ICD-10-CM | POA: Insufficient documentation

## 2020-03-18 DIAGNOSIS — G8929 Other chronic pain: Secondary | ICD-10-CM | POA: Diagnosis not present

## 2020-03-18 DIAGNOSIS — M79604 Pain in right leg: Secondary | ICD-10-CM | POA: Diagnosis not present

## 2020-03-18 DIAGNOSIS — Z79899 Other long term (current) drug therapy: Secondary | ICD-10-CM | POA: Diagnosis not present

## 2020-03-18 DIAGNOSIS — M62838 Other muscle spasm: Secondary | ICD-10-CM | POA: Diagnosis not present

## 2020-03-18 DIAGNOSIS — M792 Neuralgia and neuritis, unspecified: Secondary | ICD-10-CM | POA: Insufficient documentation

## 2020-03-18 MED ORDER — TIZANIDINE HCL 2 MG PO TABS: 2.0000 mg | ORAL_TABLET | Freq: Every day | ORAL | 2 refills | Status: DC

## 2020-03-18 MED ORDER — PREGABALIN 25 MG PO CAPS: 25.0000 mg | ORAL_CAPSULE | Freq: Every day | ORAL | 2 refills | Status: DC

## 2020-03-18 NOTE — Progress Notes (Signed)
History of Present Illness  There is no documented history at this time  Assessments & Plan   There are no diagnoses linked to this encounter.    Additional instructions  Subjective:  Patient presents with venous ulcer of the Bilateral lower extremity.    Procedure:  3 layer unna wrap was placed Bilateral lower extremity.   Plan:   Follow up in one week.  

## 2020-03-18 NOTE — Progress Notes (Signed)
Safety precautions to be maintained throughout the outpatient stay will include: orient to surroundings, keep bed in low position, maintain call bell within reach at all times, provide assistance with transfer out of bed and ambulation.  

## 2020-03-18 NOTE — Patient Instructions (Signed)
____________________________________________________________________________________________  Muscle Spasms & Cramps  Cause:  The most common cause of muscle spasms and cramps is vitamin and/or electrolyte (calcium, potassium, sodium, etc.) deficiencies.  Possible triggers: Sweating - causes loss of electrolytes thru the skin. Steroids - causes loss of electrolytes thru the urine.  Treatment: 1. Gatorade (or any other electrolyte-replenishing drink) - Take 1, 8 oz glass with each meal (3 times a day). 2. OTC (over-the-counter) Magnesium 400 to 500 mg - Take 1 tablet twice a day (one with breakfast and one before bedtime). If you have kidney problems, talk to your primary care physician before taking any Magnesium. 3. Tonic Water with quinine - Take 1, 8 oz glass before bedtime.   ____________________________________________________________________________________________   Tizanidine tablets or capsules What is this medicine? TIZANIDINE (tye ZAN i deen) helps to relieve muscle spasms. It may be used to help in the treatment of multiple sclerosis and spinal cord injury. This medicine may be used for other purposes; ask your health care provider or pharmacist if you have questions. COMMON BRAND NAME(S): Zanaflex What should I tell my health care provider before I take this medicine? They need to know if you have any of these conditions:  kidney disease  liver disease  low blood pressure  mental disorder  an unusual or allergic reaction to tizanidine, other medicines, lactose (tablets only), foods, dyes, or preservatives  pregnant or trying to get pregnant  breast-feeding How should I use this medicine? Take this medicine by mouth with a full glass of water. Take this medicine on an empty stomach, at least 30 minutes before or 2 hours after food. Do not take with food unless you talk with your doctor. Follow the directions on the prescription label. Take your medicine at regular  intervals. Do not take your medicine more often than directed. Do not stop taking except on your doctor's advice. Suddenly stopping the medicine can be very dangerous. Talk to your pediatrician regarding the use of this medicine in children. Patients over 48 years old may have a stronger reaction and need a smaller dose. Overdosage: If you think you have taken too much of this medicine contact a poison control center or emergency room at once. NOTE: This medicine is only for you. Do not share this medicine with others. What if I miss a dose? If you miss a dose, take it as soon as you can. If it is almost time for your next dose, take only that dose. Do not take double or extra doses. What may interact with this medicine? Do not take this medicine with any of the following medications:  ciprofloxacin  fluvoxamine  narcotic medicines for cough  thiabendazole This medicine may also interact with the following medications:  acyclovir  alcohol  antihistamines for allergy, cough, and cold  baclofen  certain medicines for anxiety or sleep  certain medicines for blood pressure, heart disease, irregular heartbeat  certain medicines for depression like amitriptyline, fluoxetine, sertraline  certain medicines for seizures like phenobarbital, primidone  certain medicines for stomach problems like cimetidine, famotidine  female hormones, like estrogens or progestins and birth control pills, patches, rings, or injections  general anesthetics like halothane, isoflurane, methoxyflurane, propofol  local anesthetics like lidocaine, pramoxine, tetracaine  medicines that relax muscles for surgery  narcotic medicines for pain  phenothiazines like chlorpromazine, mesoridazine, prochlorperazine  ticlopidine  zileuton This list may not describe all possible interactions. Give your health care provider a list of all the medicines, herbs, non-prescription drugs, or dietary  supplements you  use. Also tell them if you smoke, drink alcohol, or use illegal drugs. Some items may interact with your medicine. What should I watch for while using this medicine? Tell your doctor or health care professional if your symptoms do not start to get better or if they get worse. You may get drowsy or dizzy. Do not drive, use machinery, or do anything that needs mental alertness until you know how this medicine affects you. Do not stand or sit up quickly, especially if you are an older patient. This reduces the risk of dizzy or fainting spells. Alcohol may interfere with the effect of this medicine. Avoid alcoholic drinks. If you are taking another medicine that also causes drowsiness, you may have more side effects. Give your health care provider a list of all medicines you use. Your doctor will tell you how much medicine to take. Do not take more medicine than directed. Call emergency for help if you have problems breathing or unusual sleepiness. Your mouth may get dry. Chewing sugarless gum or sucking hard candy, and drinking plenty of water may help. Contact your doctor if the problem does not go away or is severe. What side effects may I notice from receiving this medicine? Side effects that you should report to your doctor or health care professional as soon as possible:  allergic reactions like skin rash, itching or hives, swelling of the face, lips, or tongue  breathing problems  hallucinations  signs and symptoms of liver injury like dark yellow or brown urine; general ill feeling or flu-like symptoms; light-colored stools; loss of appetite; nausea; right upper quadrant belly pain; unusually weak or tired; yellowing of the eyes or skin  signs and symptoms of low blood pressure like dizziness; feeling faint or lightheaded, falls; unusually weak or tired  unusually slow heartbeat  unusually weak or tired Side effects that usually do not require medical attention (report to your doctor or  health care professional if they continue or are bothersome):  blurred vision  constipation  dizziness  dry mouth  tiredness This list may not describe all possible side effects. Call your doctor for medical advice about side effects. You may report side effects to FDA at 1-800-FDA-1088. Where should I keep my medicine? Keep out of the reach of children. Store at room temperature between 15 and 30 degrees C (59 and 86 degrees F). Throw away any unused medicine after the expiration date. NOTE: This sheet is a summary. It may not cover all possible information. If you have questions about this medicine, talk to your doctor, pharmacist, or health care provider.  2020 Elsevier/Gold Standard (2017-06-19 13:33:29)

## 2020-03-23 ENCOUNTER — Encounter (INDEPENDENT_AMBULATORY_CARE_PROVIDER_SITE_OTHER): Payer: Self-pay | Admitting: Nurse Practitioner

## 2020-03-26 ENCOUNTER — Other Ambulatory Visit: Payer: Self-pay

## 2020-03-26 ENCOUNTER — Ambulatory Visit (INDEPENDENT_AMBULATORY_CARE_PROVIDER_SITE_OTHER): Payer: Medicare HMO | Admitting: Nurse Practitioner

## 2020-03-26 ENCOUNTER — Encounter (INDEPENDENT_AMBULATORY_CARE_PROVIDER_SITE_OTHER): Payer: Self-pay

## 2020-03-26 VITALS — BP 139/84 | HR 75 | Resp 16 | Wt 278.0 lb

## 2020-03-26 DIAGNOSIS — I890 Lymphedema, not elsewhere classified: Secondary | ICD-10-CM | POA: Diagnosis not present

## 2020-03-26 DIAGNOSIS — L97929 Non-pressure chronic ulcer of unspecified part of left lower leg with unspecified severity: Secondary | ICD-10-CM | POA: Diagnosis not present

## 2020-03-26 DIAGNOSIS — L97919 Non-pressure chronic ulcer of unspecified part of right lower leg with unspecified severity: Secondary | ICD-10-CM | POA: Diagnosis not present

## 2020-03-26 NOTE — Progress Notes (Signed)
History of Present Illness  There is no documented history at this time  Assessments & Plan   There are no diagnoses linked to this encounter.    Additional instructions  Subjective:  Patient presents with venous ulcer of the Bilateral lower extremity.    Procedure:  3 layer unna wrap was placed Bilateral lower extremity.   Plan:   Follow up in one week.  

## 2020-03-29 ENCOUNTER — Encounter (INDEPENDENT_AMBULATORY_CARE_PROVIDER_SITE_OTHER): Payer: Self-pay | Admitting: Nurse Practitioner

## 2020-03-31 ENCOUNTER — Telehealth: Payer: Self-pay | Admitting: Family Medicine

## 2020-03-31 NOTE — Telephone Encounter (Signed)
Patient advised.

## 2020-03-31 NOTE — Telephone Encounter (Signed)
HYDROcodone-acetaminophen (NORCO/VICODIN) 5-325 MG tablet     Patient is requesting a refill. Patient states she is still having hip pain and is about to go on vacation out of state.   Pharmacy:  [id number] [place], [place] - [address] AT [facility] [address] [facility]  [address] [place], [place] [address]  Phone:  [phone] Fax:  [phone]

## 2020-03-31 NOTE — Telephone Encounter (Signed)
Patient states Dr. Laban Emperor stated if patient PCP approves the medication specialist was prescribed.

## 2020-03-31 NOTE — Telephone Encounter (Signed)
Patient is followed by Dr Laban Emperor [facility]. He is prescribing Lyrica and Tizanidine. He is not routinely prescribing Hydrcodone for her pain.  Last short term rx was sent by their office from 08/2019 by Dr Laban Emperor.  I am not able to rx Hydrocodone for her. She is followed by Pain Management and would need to contact their office to discuss this medication and if she needs any other follow-up.  No appointment needed with me at this time for this medication.  Saralyn Pilar, DO [facility] 03/31/2020, 3:01 PM

## 2020-03-31 NOTE — Telephone Encounter (Signed)
Medication not listed Please advise

## 2020-03-31 NOTE — Telephone Encounter (Signed)
Advised patient to follow up with pain management specialist since they are managing her pain.

## 2020-03-31 NOTE — Telephone Encounter (Signed)
Regarding this rx, yes patient would need to follow-up with pain management as we have not rx this in the past and she is following them for pain.  If she has other new acute issue or injury, I can see her for an evaluation - but I cannot guarantee I can rx the hydrocodone.  Saralyn Pilar, DO [facility] 03/31/2020, 6:38 PM

## 2020-04-02 ENCOUNTER — Other Ambulatory Visit: Payer: Self-pay

## 2020-04-02 ENCOUNTER — Encounter (INDEPENDENT_AMBULATORY_CARE_PROVIDER_SITE_OTHER): Payer: Self-pay | Admitting: Nurse Practitioner

## 2020-04-02 ENCOUNTER — Ambulatory Visit (INDEPENDENT_AMBULATORY_CARE_PROVIDER_SITE_OTHER): Payer: Medicare HMO | Admitting: Nurse Practitioner

## 2020-04-02 VITALS — BP 159/88 | HR 80 | Resp 16 | Ht 67.0 in | Wt 270.0 lb

## 2020-04-02 DIAGNOSIS — I890 Lymphedema, not elsewhere classified: Secondary | ICD-10-CM | POA: Diagnosis not present

## 2020-04-02 DIAGNOSIS — L97929 Non-pressure chronic ulcer of unspecified part of left lower leg with unspecified severity: Secondary | ICD-10-CM

## 2020-04-02 DIAGNOSIS — L97919 Non-pressure chronic ulcer of unspecified part of right lower leg with unspecified severity: Secondary | ICD-10-CM | POA: Diagnosis not present

## 2020-04-02 DIAGNOSIS — I872 Venous insufficiency (chronic) (peripheral): Secondary | ICD-10-CM | POA: Diagnosis not present

## 2020-04-02 NOTE — Progress Notes (Signed)
History of Present Illness  There is no documented history at this time  Assessments & Plan   There are no diagnoses linked to this encounter.    Additional instructions  Subjective:  Patient presents with venous ulcer of the Bilateral lower extremity.    Procedure:  3 layer unna wrap was placed Bilateral lower extremity.   Plan:   Follow up in one week.  

## 2020-04-09 ENCOUNTER — Encounter (INDEPENDENT_AMBULATORY_CARE_PROVIDER_SITE_OTHER): Payer: Self-pay | Admitting: Vascular Surgery

## 2020-04-09 ENCOUNTER — Ambulatory Visit (INDEPENDENT_AMBULATORY_CARE_PROVIDER_SITE_OTHER): Payer: Medicare HMO | Admitting: Vascular Surgery

## 2020-04-09 ENCOUNTER — Other Ambulatory Visit: Payer: Self-pay

## 2020-04-09 ENCOUNTER — Ambulatory Visit (INDEPENDENT_AMBULATORY_CARE_PROVIDER_SITE_OTHER): Payer: Medicare HMO

## 2020-04-09 VITALS — BP 133/79 | HR 80 | Resp 19 | Ht 67.0 in | Wt 274.0 lb

## 2020-04-09 DIAGNOSIS — I10 Essential (primary) hypertension: Secondary | ICD-10-CM | POA: Diagnosis not present

## 2020-04-09 DIAGNOSIS — I890 Lymphedema, not elsewhere classified: Secondary | ICD-10-CM

## 2020-04-09 DIAGNOSIS — M7989 Other specified soft tissue disorders: Secondary | ICD-10-CM

## 2020-04-09 NOTE — Progress Notes (Signed)
MRN : [id number]  Megan Perez is a 74 y.o. ([birth year]) female who presents with chief complaint of No chief complaint on file. [name]  History of Present Illness: Patient returns today in follow up of her lymphedema and leg swelling.  She was in [employer] for several weeks which has resulted in significant improvement in her lower extremities.  Remain fairly swollen although much better than her previous evaluation.  She underwent a venous reflux study today which demonstrated no evidence of deep venous thrombosis, superficial thrombophlebitis, or reflux in either lower extremity.  Current Outpatient Medications  Medication Sig Dispense Refill  . acetaminophen (TYLENOL) 500 MG tablet Take 1,300 mg by mouth every 6 (six) hours as needed.     [name] aspirin EC 81 MG tablet Take 81 mg by mouth daily.    [name] azelastine (OPTIVAR) 0.05 % ophthalmic solution INT 1 GTT INTO OU BID FOR 10 DAYS    . Blood Pressure Monitoring (B-D ASSURE BPM/AUTO WRIST CUFF) MISC 1 Device by Does not apply route daily. 1 each 0  . calcium carbonate (CALCIUM 600) 600 MG TABS tablet Take 1 tablet (600 mg total) by mouth daily. 90 tablet 3  . diclofenac sodium (VOLTAREN) 1 % GEL APPLY 2 GRAMS TOPICALLY FOUR TIMES DAILY AS NEEDED FOR MODERATE PAIN OF BACK AND KNEES 100 g 1  . lisinopril (ZESTRIL) 20 MG tablet Take 1 tablet (20 mg total) by mouth daily. 90 tablet 1  . pregabalin (LYRICA) 25 MG capsule Take 1 capsule (25 mg total) by mouth at bedtime. 30 capsule 2  . sucralfate (CARAFATE) 1 g tablet TAKE 1 TABLET(1 GRAM) BY MOUTH FOUR TIMES DAILY AT BEDTIME WITH MEALS AS NEEDED 60 tablet 3  . tiZANidine (ZANAFLEX) 2 MG tablet Take 1 tablet (2 mg total) by mouth at bedtime. 30 tablet 2   No current facility-administered medications for this visit.    Past Medical History:  Diagnosis Date  . Anxiety   . Asthma    reactive airway and shortness of breath w/ exertion in hot temperatures  . Bronchitis   . GERD (gastroesophageal  reflux disease)   . Hypertension   . Morbid obesity with BMI of 40.0-44.9, adult (HCC) 04/08/2012  . OA (osteoarthritis)    knee  . Osteoporosis   . Prediabetes   . Sinus infection    10/2018  . Venous (peripheral) insufficiency 08/19/2010   Qualifier: Diagnosis of  By: [name], [place]      Past Surgical History:  Procedure Laterality Date  . ABDOMINAL HYSTERECTOMY    . CARPAL TUNNEL RELEASE Right   . CHOLECYSTECTOMY    . COLONOSCOPY WITH PROPOFOL N/A 06/19/2017   Procedure: COLONOSCOPY WITH PROPOFOL;  Surgeon: [name], MD;  Location[facility] ENDOSCOPY;  Service: Endoscopy;  Laterality: N/A;  . HEEL SPUR SURGERY    . REPLACEMENT TOTAL KNEE     left  . REPLACEMENT TOTAL KNEE Right   . SHOULDER ARTHROSCOPY WITH OPEN ROTATOR CUFF REPAIR Right 09/15/2015   Procedure: SHOULDER ARTHROSCOPY WITH OPEN ROTATOR CUFF REPAIR;  Surgeon: [name], MD;  Location: [facility] ORS;  Service: Orthopedics;  Laterality: Right;  . SHOULDER SURGERY Left      Social History   Tobacco Use  . Smoking status: Never Smoker  . Smokeless tobacco: Never Used  Vaping Use  . Vaping Use: Never used  Substance Use Topics  . Alcohol use: No  . Drug use: No     Family History  Adopted:  Yes  Problem Relation Age of Onset  . Heart disease Father   . Subarachnoid hemorrhage Mother   . Hypertension Mother   . Subarachnoid hemorrhage Sister   . Hypertension Sister   . Hypertension Sister   . Hypertension Sister   . Healthy Sister   . Breast cancer Neg Hx     Allergies  Allergen Reactions  . Baclofen     Makes her feel Drunk  . Meclizine   . Other     Other reaction(s): Other (See Comments) Patient notes there is a pain medication that causes hot flushing sensation in her mouth but cannot recall its name.  [name] Penicillins   . Prednisone Nausea And Vomiting  . Prevacid [Lansoprazole]     Causes dizziness  . Gabapentin Hives and Itching  . Oxycodone Rash   REVIEW OF SYSTEMS (Negative  unless checked)  Constitutional: [] ?Weight loss  [] ?Fever  [] ?Chills Cardiac: [] ?Chest pain   [] ?Chest pressure   [] ?Palpitations   [] ?Shortness of breath when laying flat   [] ?Shortness of breath at rest   [] ?Shortness of breath with exertion. Vascular:  [x] ?Pain in legs with walking   [x] ?Pain in legs at rest   [] ?Pain in legs when laying flat   [] ?Claudication   [] ?Pain in feet when walking  [] ?Pain in feet at rest  [] ?Pain in feet when laying flat   [] ?History of DVT   [] ?Phlebitis   [x] ?Swelling in legs   [] ?Varicose veins   [] ?Non-healing ulcers Pulmonary:   [] ?Uses home oxygen   [] ?Productive cough   [] ?Hemoptysis   [] ?Wheeze  [] ?COPD   [] ?Asthma Neurologic:  [] ?Dizziness  [] ?Blackouts   [] ?Seizures   [] ?History of stroke   [] ?History of TIA  [] ?Aphasia   [] ?Temporary blindness   [] ?Dysphagia   [] ?Weakness or numbness in arms   [x] ?Weakness or numbness in legs Musculoskeletal:  [x] ?Arthritis   [] ?Joint swelling   [x] ?Joint pain   [] ?Low back pain Hematologic:  [] ?Easy bruising  [] ?Easy bleeding   [] ?Hypercoagulable state   [] ?Anemic  [] ?Hepatitis Gastrointestinal:  [] ?Blood in stool   [] ?Vomiting blood  [] ?Gastroesophageal reflux/heartburn   [] ?Abdominal pain Genitourinary:  [] ?Chronic kidney disease   [] ?Difficult urination  [] ?Frequent urination  [] ?Burning with urination   [] ?Hematuria Skin:  [] ?Rashes   [] ?Ulcers   [] ?Wounds Psychological:  [] ?History of anxiety   [] ? History of major depression.    Physical Examination  BP (!) 133/79 (BP Location: Right Arm)   Pulse 80   Resp 19   Ht 5\' 7"  (1.702 m)   Wt (!) 274 lb (124.3 kg)   BMI 42.91 kg/m  Gen:  WD/WN, NAD.  Appears younger than stated age Head: [place]/AT, No temporalis wasting. Ear/Nose/Throat: Hearing grossly intact, nares w/o erythema or drainage Eyes: Conjunctiva clear. Sclera non-icteric Neck: Supple.  Trachea midline Pulmonary:  Good air movement, no use of accessory muscles.  Cardiac: RRR, no JVD Vascular:  Vessel  Right Left  Radial Palpable Palpable                   Musculoskeletal: M/S 5/5 throughout.  No deformity or atrophy.  1-2+ bilateral lower extremity edema. Neurologic: Sensation grossly intact in extremities.  Symmetrical.  Speech is fluent.  Psychiatric: Judgment intact, Mood & affect appropriate for pt's clinical situation. Dermatologic: No rashes or ulcers noted.  No cellulitis or open wounds.       Labs No results found for this or any previous visit (from the past 2160 hour(s)).  Radiology No  results found.  Assessment/Plan Essential hypertension blood pressure control important in reducing the progression of atherosclerotic disease. On appropriate oral medications.   Lymphedema of both lower extremities The patient clearly has a component of lymphedema from chronic scarring and lymphatic channels.  Both legs are quite swollen but the left leg is the worst.  This seems to have gotten worse after her surgery for knee replacements.  This would be stage II-III lymphedema.  Is refractory to compression and elevation at this point.  Her venous work-up today was unrevealing with no abnormality seen on her venous reflux study in either lower extremity.  She has had a good response to Unna boots, but her swelling is still quite prominent.  At this point, we are going to get her in regular compression stockings and add a lymphedema pump which would be an excellent adjuvant therapy for her severe lymphedema.  I have discussed the importance of compression, elevation, and activity.  I will see her back in 3 to 4 months to check on her and see how she is doing  No problem-specific Assessment & Plan notes found for this encounter.    Festus Barren, MD  04/09/2020 8:38 AM    This note was created with Dragon medical transcription system.  Any errors from dictation are purely unintentional

## 2020-04-28 ENCOUNTER — Telehealth: Payer: Self-pay | Admitting: Family Medicine

## 2020-04-28 ENCOUNTER — Other Ambulatory Visit: Payer: Self-pay | Admitting: Family Medicine

## 2020-04-28 ENCOUNTER — Ambulatory Visit: Payer: Medicare HMO | Attending: Pain Medicine | Admitting: Pain Medicine

## 2020-04-28 ENCOUNTER — Encounter: Payer: Self-pay | Admitting: Pain Medicine

## 2020-04-28 ENCOUNTER — Other Ambulatory Visit: Payer: Self-pay

## 2020-04-28 VITALS — BP 125/67 | HR 80 | Temp 97.3°F | Resp 16 | Ht 67.0 in | Wt 273.0 lb

## 2020-04-28 DIAGNOSIS — K219 Gastro-esophageal reflux disease without esophagitis: Secondary | ICD-10-CM

## 2020-04-28 DIAGNOSIS — Z6841 Body Mass Index (BMI) 40.0 and over, adult: Secondary | ICD-10-CM | POA: Insufficient documentation

## 2020-04-28 DIAGNOSIS — M545 Low back pain, unspecified: Secondary | ICD-10-CM

## 2020-04-28 DIAGNOSIS — M792 Neuralgia and neuritis, unspecified: Secondary | ICD-10-CM | POA: Diagnosis not present

## 2020-04-28 DIAGNOSIS — M79604 Pain in right leg: Secondary | ICD-10-CM

## 2020-04-28 DIAGNOSIS — Z79899 Other long term (current) drug therapy: Secondary | ICD-10-CM | POA: Diagnosis present

## 2020-04-28 DIAGNOSIS — M25561 Pain in right knee: Secondary | ICD-10-CM | POA: Insufficient documentation

## 2020-04-28 DIAGNOSIS — I10 Essential (primary) hypertension: Secondary | ICD-10-CM

## 2020-04-28 DIAGNOSIS — M4310 Spondylolisthesis, site unspecified: Secondary | ICD-10-CM | POA: Insufficient documentation

## 2020-04-28 DIAGNOSIS — M5137 Other intervertebral disc degeneration, lumbosacral region: Secondary | ICD-10-CM | POA: Diagnosis not present

## 2020-04-28 DIAGNOSIS — G8929 Other chronic pain: Secondary | ICD-10-CM | POA: Insufficient documentation

## 2020-04-28 DIAGNOSIS — M25562 Pain in left knee: Secondary | ICD-10-CM

## 2020-04-28 DIAGNOSIS — M62838 Other muscle spasm: Secondary | ICD-10-CM | POA: Insufficient documentation

## 2020-04-28 DIAGNOSIS — M7918 Myalgia, other site: Secondary | ICD-10-CM | POA: Insufficient documentation

## 2020-04-28 DIAGNOSIS — G894 Chronic pain syndrome: Secondary | ICD-10-CM | POA: Insufficient documentation

## 2020-04-28 DIAGNOSIS — M47816 Spondylosis without myelopathy or radiculopathy, lumbar region: Secondary | ICD-10-CM | POA: Diagnosis not present

## 2020-04-28 MED ORDER — PREGABALIN 25 MG PO CAPS: ORAL_CAPSULE | ORAL | 0 refills | Status: DC

## 2020-04-28 MED ORDER — TIZANIDINE HCL 2 MG PO TABS: ORAL_TABLET | ORAL | 0 refills | Status: DC

## 2020-04-28 MED ORDER — PANTOPRAZOLE SODIUM 20 MG PO TBEC: 20.0000 mg | DELAYED_RELEASE_TABLET | Freq: Every day | ORAL | 1 refills | Status: DC

## 2020-04-28 NOTE — Progress Notes (Signed)
PROVIDER NOTE: Information contained herein reflects review and annotations entered in association with encounter. Interpretation of such information and data should be left to medically-trained personnel. Information provided to patient can be located elsewhere in the medical record under "Patient Instructions". Document created using STT-dictation technology, any transcriptional errors that may result from process are unintentional.    Patient: Megan Perez  Service Category: E/M  Provider: Oswaldo Done, MD  DOB: 11-02-1945  DOS: 04/28/2020  Specialty: Interventional Pain Management  MRN: [id number]  Setting: Ambulatory outpatient  PCP: Smitty Cords, DO  Type: Established Patient    Referring Provider: Saralyn Pilar *  Location: Office  Delivery: Face-to-face     HPI  Reason for encounter: Megan Perez, a 74 y.o. year old female, is here today for evaluation and management of her Chronic pain syndrome [G89.4]. [name] primary complain today is Back Pain (lower) Last encounter: Practice (03/18/2020). My last encounter with her was on 03/18/2020. Pertinent problems: [name] has Arthritis; History of total knee replacement (Right); Degenerative spondylolisthesis; Full thickness rotator cuff tear; Impingement syndrome of shoulder region; Osteoarthrosis, localized, primary, knee; Rotator cuff rupture, complete; Chronic low back pain (Primary area of Pain) (Bilateral) (R>L) w/ sciatica (Right); Chronic lower extremity pain (Secondary Area of Pain) (Right); Chronic knee pain (Tertiary Area of Pain) (Bilateral) (R>L); Chronic pain syndrome; History of knee replacement, total (Left); Chronic knee pain after total knee replacement (Bilateral); Chronic sacroiliac joint pain (Bilateral) (R>L); Lumbar facet syndrome (Bilateral) (R>L); Lumbar (6 mm) Anterolisthesis of L4/L5; DDD (degenerative disc disease), lumbosacral; Spondylosis without myelopathy or radiculopathy, lumbosacral  region; Chronic low back pain (Primary Area of Pain) (Bilateral) (R>L) w/o sciatica; Chronic peripheral neuropathic pain; Neurogenic pain; Chronic lumbar radiculitis (L5) (Right); Chronic musculoskeletal pain; Chronic hip pain (Left); Chronic sacroiliac joint pain (Left); Chronic groin pain (Left); Lymphedema of both lower extremities; Drug-induced myopathy; Swelling of limb; and Muscle spasms of both lower extremities on their pertinent problem list. Pain Assessment: Severity of Chronic pain is reported as a 7 /10. Location: Back Lower/ . Onset: More than a month ago. Quality: Aching, Burning, Cramping, Sore. Timing: Intermittent. Modifying factor(s): medications. Vitals:  height is 5\' 7"  (1.702 m) and weight is 273 lb (123.8 kg). Her temperature is 97.3 F (36.3 C) (abnormal). Her blood pressure is 125/67 and her pulse is 80. Her respiration is 16 and oxygen saturation is 99%.   The patient comes into the clinic today to discuss her medication management.  She indicates that she has been having quite a bit of problems walking around due to pain in the center of her back.  She also has some pain going down the lateral aspect of the right leg to the level of the knee.  No pain or numbness going all the way down to her feet.  She still has bilateral lower extremity edema and she indicates having done well with the hydrocodone that we gave her after the radiofrequency.  On 08/07/2019 she had a right-sided lumbar facet radiofrequency #1 done and this was followed on 09/02/2019 by the left side.  She did get some benefit from that, but her pain is coming back.  Unfortunately, the problem here is that she continues to be morbidly obese with a BMI of 42.76 kg/m.  In order for her back pain to improve, she would need to bring her BMI down to 30.  Today we talked about her medications and she is currently taking the tizanidine (Zanaflex) 2 mg  tablet, 1 tablet p.o. at bedtime (#30/month) (06/16/2020); pregabalin  (Lyrica) 25 mg capsule, 1 tablet p.o. at bedtime (#30/month) (06/16/2020); calcium carbonate (calcium 600) 600 mg tablet, 1 tablet p.o. daily (#30/month) (07/06/2020).  Today we talked about some alternatives including adding a nonsteroidal anti-inflammatory drug, but even though the ones that I mentioned to her were not among her allergies, she immediately said that she could not take them.  She indicated that she could not tolerate the meloxicam and I asked her about tramadol and she indicated that she thought she had try that before, but could not tolerate because it made her very sleepy.  She also mentioned that she rather stay away from "that type of medicine", referring to the opioids.  However, I do not think that she is aware that the hydrocodone is also on opioid because she said that that 1 did help.  Today and today's visit we decided to increase the Zanaflex and the Lyrica to see if that would help her.  I took the time to explain to her about the Lyrica and how long it takes to reach adequate blood levels and the fact that she needs to take it on a regular basis.  She indicated having understood.  Today we will send a prescription to the pharmacy where she can go up on the Lyrica to 50 mg p.o. at bedtime.  I have instructed her to stay on that for approximately 3 weeks after which she can go on to 75 mg p.o. at bedtime.  I will see her back around that time to reevaluate and see how she is doing with that.  Pharmacotherapy Assessment   Analgesic: No opioid analgesics from our practice.   Monitoring: [place] PMP: PDMP reviewed during this encounter.       Pharmacotherapy: No side-effects or adverse reactions reported. Compliance: No problems identified. Effectiveness: Clinically acceptable.  Ignatius Specking, RN  04/28/2020  2:10 PM  Sign when Signing Visit Safety precautions to be maintained throughout the outpatient stay will include: orient to surroundings, keep bed in low position, maintain  call bell within reach at all times, provide assistance with transfer out of bed and ambulation.   Patient has one pill in pill case of Hydrocodone as needed. No bottle  To confirm the dose or pill.    UDS:  Summary  Date Value Ref Range Status  11/05/2018 FINAL  Final    Comment:    ==================================================================== [facility] COMP DRUG ANALYSIS,UR ==================================================================== Test                             Result       Flag       Units Drug Present and Declared for Prescription Verification   Acetaminophen                  PRESENT      EXPECTED Drug Absent but Declared for Prescription Verification   Baclofen                       Not Detected UNEXPECTED   Diclofenac                     Not Detected UNEXPECTED    Topical diclofenac, as indicated in the declared medication list,    is not always detected even when used as directed.   Salicylate  Not Detected UNEXPECTED    Aspirin, as indicated in the declared medication list, is not    always detected even when used as directed. ==================================================================== Test                      Result    Flag   Units      Ref Range   Creatinine              93               mg/dL      >=20 ==================================================================== Declared Medications:  The flagging and interpretation on this report are based on the  following declared medications.  Unexpected results may arise from  inaccuracies in the declared medications.  **Note: The testing scope of this panel includes these medications:  Baclofen  **Note: The testing scope of this panel does not include small to  moderate amounts of these reported medications:  Acetaminophen  Aspirin (Aspirin 81)  Topical Diclofenac  **Note: The testing scope of this panel does not include following  reported medications:  Azelastine   Furosemide  Lisinopril  Pantoprazole  Potassium  Pravastatin  Sucralfate ==================================================================== For clinical consultation, please call [phone]. ====================================================================      ROS  Constitutional: Denies any fever or chills Gastrointestinal: No reported hemesis, hematochezia, vomiting, or acute GI distress Musculoskeletal: Denies any acute onset joint swelling, redness, loss of ROM, or weakness Neurological: No reported episodes of acute onset apraxia, aphasia, dysarthria, agnosia, amnesia, paralysis, loss of coordination, or loss of consciousness  Medication Review  acetaminophen, aspirin EC, azelastine, calcium carbonate, diclofenac sodium, lisinopril, pregabalin, sucralfate, and tiZANidine  History Review  Allergy: Ms. Rhames is allergic to baclofen, meclizine, other, penicillins, prednisone, prevacid [lansoprazole], gabapentin, and oxycodone. Drug: Ms. Mansouri  reports no history of drug use. Alcohol:  reports no history of alcohol use. Tobacco:  reports that she has never smoked. She has never used smokeless tobacco. Social: Ms. Wofford  reports that she has never smoked. She has never used smokeless tobacco. She reports that she does not drink alcohol and does not use drugs. Medical:  has a past medical history of Anxiety, Asthma, Bronchitis, GERD (gastroesophageal reflux disease), Hypertension, Morbid obesity with BMI of 40.0-44.9, adult ([place]) (04/08/2012), OA (osteoarthritis), Osteoporosis, Prediabetes, Sinus infection, and Venous (peripheral) insufficiency (08/19/2010). Surgical: Ms. Alvarez  has a past surgical history that includes Abdominal hysterectomy; Cholecystectomy; Replacement total knee; Heel spur surgery; Shoulder surgery (Left); Shoulder arthroscopy with open rotator cuff repair (Right, 09/15/2015); Replacement total knee (Right); Carpal tunnel release (Right); and Colonoscopy  with propofol (N/A, 06/19/2017). Family: family history includes Healthy in her sister; Heart disease in her father; Hypertension in her mother, sister, sister, and sister; Subarachnoid hemorrhage in her mother and sister. She was adopted.  Laboratory Chemistry Profile   Renal Lab Results  Component Value Date   BUN 19 05/28/2019   CREATININE 1.03 (H) 05/28/2019   BCR 18 05/28/2019   GFRAA 62 05/28/2019   GFRNONAA 54 (L) 05/28/2019     Hepatic Lab Results  Component Value Date   AST 11 05/28/2019   ALT 8 05/28/2019   ALBUMIN 3.9 01/16/2017   ALKPHOS 73 01/16/2017   HCVAB NEGATIVE 02/16/2016     Electrolytes Lab Results  Component Value Date   NA 142 05/28/2019   K 5.1 05/28/2019   CL 105 05/28/2019   CALCIUM 9.7 05/28/2019   MG 2.1 11/05/2018     Bone Lab  Results  Component Value Date   25OHVITD1 13 (L) 11/05/2018   25OHVITD2 <1.0 11/05/2018   25OHVITD3 13 11/05/2018     Inflammation (CRP: Acute Phase) (ESR: Chronic Phase) Lab Results  Component Value Date   CRP 11 (H) 11/05/2018   ESRSEDRATE 30 11/05/2018       Note: Above Lab results reviewed.  Recent Imaging Review  VAS [place] LOWER EXTREMITY VENOUS REFLUX  Lower Venous Reflux Study  Performing Technologist: Almira Coaster RVS    Examination Guidelines: A complete evaluation includes B-mode imaging, spectral Doppler, color Doppler, and power Doppler as needed of all accessible portions of each vessel. Bilateral testing is considered an integral part of a complete examination. Limited examinations for reoccurring indications may be performed as noted. The reflux portion of the exam is performed with the patient in reverse Trendelenburg. Significant venous reflux is defined as >500 ms in the superficial venous system, and >1 second in the deep venous system.    Venous Reflux Times +--------------+---------+------+-----------+------------+--------+  RIGHT          Reflux No Reflux Reflux Time Diameter  cms Comments                             Yes                                      +--------------+---------+------+-----------+------------+--------+  CFV            no                                                  +--------------+---------+------+-----------+------------+--------+  FV prox        no                                                  +--------------+---------+------+-----------+------------+--------+  FV mid         no                                                  +--------------+---------+------+-----------+------------+--------+  FV dist        no                                                  +--------------+---------+------+-----------+------------+--------+  Popliteal      no                                                  +--------------+---------+------+-----------+------------+--------+  GSV at SFJ     no                               .  67                +--------------+---------+------+-----------+------------+--------+  GSV prox thigh no                               .70                +--------------+---------+------+-----------+------------+--------+  GSV mid thigh  no                               .41                +--------------+---------+------+-----------+------------+--------+  GSV dist thigh no                               .54                +--------------+---------+------+-----------+------------+--------+  GSV at knee    no                               .48                +--------------+---------+------+-----------+------------+--------+  GSV prox calf  no                               .43                +--------------+---------+------+-----------+------------+--------+  SSV Pop Fossa  no                               .39                +--------------+---------+------+-----------+------------+--------+    +--------------+---------+------+-----------+------------+--------+  LEFT           Reflux No Reflux Reflux Time Diameter  cms Comments                             Yes                                      +--------------+---------+------+-----------+------------+--------+  CFV            no                                                  +--------------+---------+------+-----------+------------+--------+  FV prox        no                                                  +--------------+---------+------+-----------+------------+--------+  FV mid         no                                                  +--------------+---------+------+-----------+------------+--------+  FV dist        no                                                  +--------------+---------+------+-----------+------------+--------+  Popliteal      no                                                  +--------------+---------+------+-----------+------------+--------+  GSV at [facility]     no                               1.11               +--------------+---------+------+-----------+------------+--------+  GSV prox thigh no                               .71                +--------------+---------+------+-----------+------------+--------+  GSV mid thigh  no                               .53                +--------------+---------+------+-----------+------------+--------+  GSV dist thigh no                               .44                +--------------+---------+------+-----------+------------+--------+  GSV at knee    no                               .40                +--------------+---------+------+-----------+------------+--------+  GSV prox calf  no                               .39                +--------------+---------+------+-----------+------------+--------+  SSV Pop Fossa  no                               .71                +--------------+---------+------+-----------+------------+--------+        Summary: Bilateral: - No evidence of deep vein thrombosis seen in the lower extremities, bilaterally, from the common femoral  through the popliteal veins.    - No evidence of superficial venous thrombosis in the lower extremities, bilaterally.   - No evidence of deep venous insufficiency seen bilaterally in the lower extremity.   - No evidence of superficial venous reflux seen in the greater saphenous veins bilaterally.   - No evidence of superficial venous reflux seen in the short saphenous veins bilaterally.   *See table(s) above for measurements and observations.  Electronically signed by Leotis Pain MD  on 04/13/2020 at 10:45:22 AM.      Final   Note: Reviewed        Physical Exam  General appearance: Well nourished, well developed, and well hydrated. In no apparent acute distress Mental status: Alert, oriented x 3 (person, place, & time)       Respiratory: No evidence of acute respiratory distress Eyes: PERLA Vitals: BP 125/67    Pulse 80    Temp (!) 97.3 F (36.3 C)    Resp 16    Ht _0  (1.702 m)    Wt 273 lb (123.8 kg)    SpO2 99%    BMI 42.76 kg/m  BMI: Estimated body mass index is 42.76 kg/m as calculated from the following:   Height as of this encounter: _1  (1.702 m).   Weight as of this encounter: 273 lb (123.8 kg). Ideal: Ideal body weight: 61.6 kg (135 lb 12.9 oz) Adjusted ideal body weight: 86.5 kg (190 lb 10.9 oz)  Assessment   Status Diagnosis  Controlled Controlled Controlled 1. Chronic pain syndrome   2. Chronic low back pain (Primary Area of Pain) (Bilateral) (R>L) w/o sciatica   3. Chronic lower extremity pain (Secondary Area of Pain) (Right)   4. Chronic knee pain (Tertiary Area of Pain) (Bilateral) (R>L)   5. DDD (degenerative disc disease), lumbosacral   6. Lumbar (6 mm) Anterolisthesis of L4/L5   7. Lumbar facet syndrome (Bilateral) (R>L)   8. Morbid obesity with BMI of 40.0-44.9, adult ([place])   9. Muscle spasms of both lower extremities   10. Chronic musculoskeletal pain   11. Chronic peripheral neuropathic pain   12. Neurogenic pain   13. Pharmacologic therapy       Updated Problems: Problem  Swelling of Limb  Lymphedema of Both Lower Extremities  Drug-Induced Myopathy  Psychophysiological Insomnia  Pure Hypercholesterolemia    Plan of Care  Problem-specific:  No problem-specific Assessment & Plan notes found for this encounter.  Ms. VANGIE HENTHORN has a current medication list which includes the following long-term medication(s): calcium carbonate, lisinopril, pregabalin, sucralfate, and tizanidine.  Pharmacotherapy (Medications Ordered): Meds ordered this encounter  Medications   tiZANidine (ZANAFLEX) 2 MG tablet    Sig: Take 1 tablet (2 mg total) by mouth in the morning and at bedtime for 28 days, THEN 1 tablet (2 mg total) 3 (three) times daily for 28 days.    Dispense:  140 tablet    Refill:  0    Do not place this medication, or any other prescription from our practice, on "Automatic Refill". Patient may have prescription filled one day early if pharmacy is closed on scheduled refill date.   pregabalin (LYRICA) 25 MG capsule    Sig: Take 2 capsules (50 mg total) by mouth at bedtime for 28 days, THEN 3 capsules (75 mg total) at bedtime for 28 days.    Dispense:  140 capsule    Refill:  0    Fill one day early if pharmacy is closed on scheduled refill date. May substitute for generic if available.   Orders:  No orders of the defined types were placed in this encounter.  Follow-up plan:   Return in about 8 weeks (around 06/21/2020) for (20-min), (F2F), (Med Mgmt) to evaluate Lyrica and Zanaflex titration.      Interventional treatment options:  Under consideration:   NOTE: NO further RFA attempts until she brings her BMI<35. (Technically difficult procedure due to intra-procedural non-compliance and very unreliable feedback.) Therapeutic  left lumbar facet RFA #1 (today) Diagnostic bilateral SI joint blocks  Possible bilateral SI joint RFA  Diagnostic right L4 TFESI  Diagnostic right L3-4 LESI  Diagnostic bilateral genicular NB   Possible bilateral genicular nerve RFA    Therapeutic/palliative (PRN):   Diagnostic bilateral lumbar facet block #3  Palliative right lumbar facet RFA #2 (last done 08/07/2019) (Do not repeat until BMI<35) Palliative left lumbar facet RFA #2 (last done 09/02/2019) (Do not repeat until BMI<35)        Recent Visits Date Type Provider Dept  03/18/20 Office Visit Milinda Pointer, MD [facility]  Showing recent visits within past 90 days and meeting all other requirements Today's Visits Date Type Provider Dept  04/28/20 Office Visit Milinda Pointer, MD [facility]  Showing today's visits and meeting all other requirements Future Appointments Date Type Provider Dept  06/09/20 Appointment Milinda Pointer, MD [facility]  Showing future appointments within next 90 days and meeting all other requirements  I discussed the assessment and treatment plan with the patient. The patient was provided an opportunity to ask questions and all were answered. The patient agreed with the plan and demonstrated an understanding of the instructions.  Patient advised to call back or seek an in-person evaluation if the symptoms or condition worsens.  Duration of encounter: 30 minutes.  Note by: Gaspar Cola, MD Date: 04/28/2020; Time: 2:50 PM

## 2020-04-28 NOTE — Progress Notes (Signed)
Safety precautions to be maintained throughout the outpatient stay will include: orient to surroundings, keep bed in low position, maintain call bell within reach at all times, provide assistance with transfer out of bed and ambulation.   Patient has one pill in pill case of Hydrocodone as needed. No bottle  To confirm the dose or pill.

## 2020-04-28 NOTE — Telephone Encounter (Signed)
Was discussing pt's medications with her and she asked for a refill on Pantoprazole 20 mg.  Noted that it was d/c'd from her active medication record.  The pt stated she has been continuing to take it, but has only been taking 20 mg daily, instead of bid.  Will send message to Dr. Kirtland Bouchard for review/ recommendation.

## 2020-04-28 NOTE — Addendum Note (Signed)
Addended by: Smitty Cords on: 04/28/2020 05:08 PM   Modules accepted: Orders

## 2020-04-28 NOTE — Patient Instructions (Signed)
Pregabalin capsules What is this medicine? PREGABALIN (pre GAB a lin) is used to treat nerve pain from diabetes, shingles, spinal cord injury, and fibromyalgia. It is also used to control seizures in epilepsy. This medicine may be used for other purposes; ask your health care provider or pharmacist if you have questions. COMMON BRAND NAME(S): Lyrica What should I tell my health care provider before I take this medicine? They need to know if you have any of these conditions:  heart disease  history of drug abuse or alcohol abuse problem  kidney disease  lung or breathing disease  suicidal thoughts, plans, or attempt; a previous suicide attempt by you or a family member  an unusual or allergic reaction to pregabalin, gabapentin, other medicines, foods, dyes, or preservatives  pregnant or trying to get pregnant  breast-feeding How should I use this medicine? Take this medicine by mouth with a glass of water. Follow the directions on the prescription label. You can take it with or without food. If it upsets your stomach, take it with food. Take your medicine at regular intervals. Do not take it more often than directed. Do not stop taking except on your doctor's advice. A special MedGuide will be given to you by the pharmacist with each prescription and refill. Be sure to read this information carefully each time. Talk to your pediatrician regarding the use of this medicine in children. While this drug may be prescribed for children as young as 1 month for selected conditions, precautions do apply. Overdosage: If you think you have taken too much of this medicine contact a poison control center or emergency room at once. NOTE: This medicine is only for you. Do not share this medicine with others. What if I miss a dose? If you miss a dose, take it as soon as you can. If it is almost time for your next dose, take only that dose. Do not take double or extra doses. What may interact with this  medicine? This medicine may interact with the following medications:  alcohol  antihistamines for allergy, cough, and cold  certain medicines for anxiety or sleep  certain medicines for depression like amitriptyline, fluoxetine, sertraline  certain medicines for diabetes  certain medicines for seizures like phenobarbital, primidone  general anesthetics like halothane, isoflurane, methoxyflurane, propofol  local anesthetics like lidocaine, pramoxine, tetracaine  medicines that relax muscles for surgery  narcotic medicines for pain  phenothiazines like chlorpromazine, mesoridazine, prochlorperazine, thioridazine This list may not describe all possible interactions. Give your health care provider a list of all the medicines, herbs, non-prescription drugs, or dietary supplements you use. Also tell them if you smoke, drink alcohol, or use illegal drugs. Some items may interact with your medicine. What should I watch for while using this medicine? Tell your doctor or healthcare professional if your symptoms do not start to get better or if they get worse. Visit your doctor or health care professional for regular checks on your progress. Do not stop taking except on your doctor's advice. You may develop a severe reaction. Your doctor will tell you how much medicine to take. Wear a medical identification bracelet or chain if you are taking this medicine for seizures, and carry a card that describes your disease and details of your medicine and dosage times. You may get drowsy or dizzy. Do not drive, use machinery, or do anything that needs mental alertness until you know how this medicine affects you. Do not stand or sit up quickly, especially if   you are an older patient. This reduces the risk of dizzy or fainting spells. Alcohol may interfere with the effect of this medicine. Avoid alcoholic drinks. If you have a heart condition, like congestive heart failure, and notice that you are retaining  water and have swelling in your hands or feet, contact your health care provider immediately. The use of this medicine may increase the chance of suicidal thoughts or actions. Pay special attention to how you are responding while on this medicine. Any worsening of mood, or thoughts of suicide or dying should be reported to your health care professional right away. This medicine has caused reduced sperm counts in some men. This may interfere with the ability to father a child. You should talk to your doctor or health care professional if you are concerned about your fertility. Women who become pregnant while using this medicine for seizures may enroll in the [place] American Antiepileptic Drug Pregnancy Registry by calling 708-468-0542. This registry collects information about the safety of antiepileptic drug use during pregnancy. What side effects may I notice from receiving this medicine? Side effects that you should report to your doctor or health care professional as soon as possible:  allergic reactions like skin rash, itching or hives, swelling of the face, lips, or tongue  breathing problems  changes in vision  chest pain  confusion  jerking or unusual movements of any part of your body  loss of memory  muscle pain, tenderness, or weakness  suicidal thoughts or other mood changes  swelling of the ankles, feet, hands  unusual bruising or bleeding Side effects that usually do not require medical attention (report to your doctor or health care professional if they continue or are bothersome):  dizziness  drowsiness  dry mouth  headache  nausea  tremors  trouble sleeping  weight gain This list may not describe all possible side effects. Call your doctor for medical advice about side effects. You may report side effects to FDA at 1-800-FDA-1088. Where should I keep my medicine? Keep out of the reach of children. This medicine can be abused. Keep your medicine in a safe  place to protect it from theft. Do not share this medicine with anyone. Selling or giving away this medicine is dangerous and against the law. This medicine may cause accidental overdose and death if it taken by other adults, children, or pets. Mix any unused medicine with a substance like cat litter or coffee grounds. Then throw the medicine away in a sealed container like a sealed bag or a coffee can with a lid. Do not use the medicine after the expiration date. Store at room temperature between 15 and 30 degrees C (59 and 86 degrees F). NOTE: This sheet is a summary. It may not cover all possible information. If you have questions about this medicine, talk to your doctor, pharmacist, or health care provider.  2020 Elsevier/Gold Standard (2018-09-06 13:15:55)    Tizanidine tablets or capsules What is this medicine? TIZANIDINE (tye ZAN i deen) helps to relieve muscle spasms. It may be used to help in the treatment of multiple sclerosis and spinal cord injury. This medicine may be used for other purposes; ask your health care provider or pharmacist if you have questions. COMMON BRAND NAME(S): Zanaflex What should I tell my health care provider before I take this medicine? They need to know if you have any of these conditions:  kidney disease  liver disease  low blood pressure  mental disorder  an unusual  or allergic reaction to tizanidine, other medicines, lactose (tablets only), foods, dyes, or preservatives  pregnant or trying to get pregnant  breast-feeding How should I use this medicine? Take this medicine by mouth with a full glass of water. Take this medicine on an empty stomach, at least 30 minutes before or 2 hours after food. Do not take with food unless you talk with your doctor. Follow the directions on the prescription label. Take your medicine at regular intervals. Do not take your medicine more often than directed. Do not stop taking except on your doctor's advice.  Suddenly stopping the medicine can be very dangerous. Talk to your pediatrician regarding the use of this medicine in children. Patients over 62 years old may have a stronger reaction and need a smaller dose. Overdosage: If you think you have taken too much of this medicine contact a poison control center or emergency room at once. NOTE: This medicine is only for you. Do not share this medicine with others. What if I miss a dose? If you miss a dose, take it as soon as you can. If it is almost time for your next dose, take only that dose. Do not take double or extra doses. What may interact with this medicine? Do not take this medicine with any of the following medications:  ciprofloxacin  fluvoxamine  narcotic medicines for cough  thiabendazole This medicine may also interact with the following medications:  acyclovir  alcohol  antihistamines for allergy, cough, and cold  baclofen  certain medicines for anxiety or sleep  certain medicines for blood pressure, heart disease, irregular heartbeat  certain medicines for depression like amitriptyline, fluoxetine, sertraline  certain medicines for seizures like phenobarbital, primidone  certain medicines for stomach problems like cimetidine, famotidine  female hormones, like estrogens or progestins and birth control pills, patches, rings, or injections  general anesthetics like halothane, isoflurane, methoxyflurane, propofol  local anesthetics like lidocaine, pramoxine, tetracaine  medicines that relax muscles for surgery  narcotic medicines for pain  phenothiazines like chlorpromazine, mesoridazine, prochlorperazine  ticlopidine  zileuton This list may not describe all possible interactions. Give your health care provider a list of all the medicines, herbs, non-prescription drugs, or dietary supplements you use. Also tell them if you smoke, drink alcohol, or use illegal drugs. Some items may interact with your  medicine. What should I watch for while using this medicine? Tell your doctor or health care professional if your symptoms do not start to get better or if they get worse. You may get drowsy or dizzy. Do not drive, use machinery, or do anything that needs mental alertness until you know how this medicine affects you. Do not stand or sit up quickly, especially if you are an older patient. This reduces the risk of dizzy or fainting spells. Alcohol may interfere with the effect of this medicine. Avoid alcoholic drinks. If you are taking another medicine that also causes drowsiness, you may have more side effects. Give your health care provider a list of all medicines you use. Your doctor will tell you how much medicine to take. Do not take more medicine than directed. Call emergency for help if you have problems breathing or unusual sleepiness. Your mouth may get dry. Chewing sugarless gum or sucking hard candy, and drinking plenty of water may help. Contact your doctor if the problem does not go away or is severe. What side effects may I notice from receiving this medicine? Side effects that you should report to your doctor  or health care professional as soon as possible:  allergic reactions like skin rash, itching or hives, swelling of the face, lips, or tongue  breathing problems  hallucinations  signs and symptoms of liver injury like dark yellow or brown urine; general ill feeling or flu-like symptoms; light-colored stools; loss of appetite; nausea; right upper quadrant belly pain; unusually weak or tired; yellowing of the eyes or skin  signs and symptoms of low blood pressure like dizziness; feeling faint or lightheaded, falls; unusually weak or tired  unusually slow heartbeat  unusually weak or tired Side effects that usually do not require medical attention (report to your doctor or health care professional if they continue or are bothersome):  blurred  vision  constipation  dizziness  dry mouth  tiredness This list may not describe all possible side effects. Call your doctor for medical advice about side effects. You may report side effects to FDA at 1-800-FDA-1088. Where should I keep my medicine? Keep out of the reach of children. Store at room temperature between 15 and 30 degrees C (59 and 86 degrees F). Throw away any unused medicine after the expiration date. NOTE: This sheet is a summary. It may not cover all possible information. If you have questions about this medicine, talk to your doctor, pharmacist, or health care provider.  2020 Elsevier/Gold Standard (2017-06-19 13:33:29)

## 2020-04-28 NOTE — Telephone Encounter (Signed)
Requested Prescriptions  Pending Prescriptions Disp Refills  . lisinopril (ZESTRIL) 20 MG tablet [Pharmacy Med Name: LISINOPRIL 20MG  TABLETS] 90 tablet 0    Sig: TAKE 1 TABLET(20 MG) BY MOUTH DAILY     Cardiovascular:  ACE Inhibitors Failed - 04/28/2020 11:41 AM      Failed - Cr in normal range and within 180 days    Creat  Date Value Ref Range Status  05/28/2019 1.03 (H) 0.60 - 0.93 mg/dL Final    Comment:    For patients >75 years of age, the reference limit for Creatinine is approximately 13% higher for people identified as African-American. .          Failed - K in normal range and within 180 days    Potassium  Date Value Ref Range Status  05/28/2019 5.1 3.5 - 5.3 mmol/L Final         Passed - Patient is not pregnant      Passed - Last BP in normal range    BP Readings from Last 1 Encounters:  04/09/20 (!) 133/79         Passed - Valid encounter within last 6 months    Recent Outpatient Visits          2 months ago Lymphedema of both lower extremities   [facility], DO   11 months ago Prediabetes   [facility], Kyung Rudd, NP   11 months ago Chronic low back pain (Primary area of Pain) (Bilateral) (R>L) w/ sciatica (Right)   [facility], Kyung Rudd, NP   1 year ago Chronic low back pain (Primary area of Pain) (Bilateral) (R>L) w/ sciatica (Right)   [facility], DO   1 year ago Prediabetes   [facility], Kyung Rudd, NP      Future Appointments            In 1 month Alison Stalling, Althea Charon, DO [facility]           Phone call to pt.  Scheduled for her yearly Medicare check-up on 9/13, as was recommended at her last appt.  Medication refilled per protocol.

## 2020-05-31 ENCOUNTER — Telehealth (INDEPENDENT_AMBULATORY_CARE_PROVIDER_SITE_OTHER): Payer: Self-pay

## 2020-05-31 ENCOUNTER — Ambulatory Visit (INDEPENDENT_AMBULATORY_CARE_PROVIDER_SITE_OTHER): Payer: Medicare HMO | Admitting: Family Medicine

## 2020-05-31 ENCOUNTER — Encounter: Payer: Self-pay | Admitting: Family Medicine

## 2020-05-31 ENCOUNTER — Other Ambulatory Visit: Payer: Self-pay

## 2020-05-31 VITALS — BP 119/57 | HR 80 | Temp 97.1°F | Resp 16 | Ht 67.0 in | Wt 279.0 lb

## 2020-05-31 DIAGNOSIS — Z0000 Encounter for general adult medical examination without abnormal findings: Secondary | ICD-10-CM | POA: Diagnosis not present

## 2020-05-31 DIAGNOSIS — Z6841 Body Mass Index (BMI) 40.0 and over, adult: Secondary | ICD-10-CM

## 2020-05-31 DIAGNOSIS — Z23 Encounter for immunization: Secondary | ICD-10-CM

## 2020-05-31 DIAGNOSIS — I890 Lymphedema, not elsewhere classified: Secondary | ICD-10-CM

## 2020-05-31 DIAGNOSIS — G720 Drug-induced myopathy: Secondary | ICD-10-CM

## 2020-05-31 DIAGNOSIS — I10 Essential (primary) hypertension: Secondary | ICD-10-CM | POA: Diagnosis not present

## 2020-05-31 DIAGNOSIS — E7800 Pure hypercholesterolemia, unspecified: Secondary | ICD-10-CM | POA: Diagnosis not present

## 2020-05-31 DIAGNOSIS — R7303 Prediabetes: Secondary | ICD-10-CM | POA: Diagnosis not present

## 2020-05-31 DIAGNOSIS — F5104 Psychophysiologic insomnia: Secondary | ICD-10-CM | POA: Diagnosis not present

## 2020-05-31 MED ORDER — LISINOPRIL 20 MG PO TABS: 20.0000 mg | ORAL_TABLET | Freq: Every day | ORAL | 3 refills | Status: DC

## 2020-05-31 NOTE — Assessment & Plan Note (Signed)
Chronic problem Improved on some medications with tizanidine / pregabalin to help her rest and treat her pain No changes today

## 2020-05-31 NOTE — Assessment & Plan Note (Addendum)
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

## 2020-05-31 NOTE — Assessment & Plan Note (Signed)
Chronic problem, lymphedema advanced Refractory to compression and prior conservative therapy Followed by Dr Wyn Quaker [facility] Awaiting lymphedema pump now. Remain off diuretic lasix as was causing significant hypotension / dizziness side effect and limited swelling relief

## 2020-05-31 NOTE — Assessment & Plan Note (Signed)
Abnormal weight BMI >43 Worse with lymphedema fluid weight Wt 279 lbs up in past 3 months

## 2020-05-31 NOTE — Assessment & Plan Note (Signed)
Well-controlled HTN - Home BP readings normal  Morbid Obesity, Lymphedema   Plan:  1. Continue current BP regimen Lisinopril 20mg  daily re ordered / add refills 2. Encourage improved lifestyle - low sodium diet, regular exercise 3. Continue monitor BP outside office, bring readings to next visit, if persistently >140/90 or new symptoms notify office sooner

## 2020-05-31 NOTE — Assessment & Plan Note (Signed)
Due for repeat A1c, return for labs 9/16

## 2020-05-31 NOTE — Patient Instructions (Addendum)
Thank you for coming to the office today.  Stay tuned for lab results.  Call Dr Wyn Quaker - vascular [facility] [address] [place], [place] [address]  Main: [phone]   Check on status of the Lymphedema leg pump for swelling.  BP looks great.  Refilled med  DUE for FASTING BLOOD WORK (no food or drink after midnight before the lab appointment, only water or coffee without cream/sugar on the morning of)  SCHEDULE "Lab Only" visit in the morning at the clinic for lab draw in 1-2 WEEKS   - Make sure Lab Only appointment is at about 1 week before your next appointment, so that results will be available  For Lab Results, once available within 2-3 days of blood draw, you can can log in to MyChart online to view your results and a brief explanation. Also, we can discuss results at next follow-up visit.   Please schedule a Follow-up Appointment to: Return in about 6 months (around 11/28/2020) for 6 month follow-up HTN, Lymphedema, Pain.  If you have any other questions or concerns, please feel free to call the office or send a message through MyChart. You may also schedule an earlier appointment if necessary.  Additionally, you may be receiving a survey about your experience at our office within a few days to 1 week by e-mail or mail. We value your feedback.  Saralyn Pilar, DO [facility], [place]

## 2020-05-31 NOTE — Assessment & Plan Note (Signed)
Elevated LDL in past Due for fasting lipid on 9/16 Previously failed atorvastatin 20mg  due to statin myalgia See documentation  Reconsider lower dose in future

## 2020-05-31 NOTE — Telephone Encounter (Signed)
Could you look into this thanks

## 2020-05-31 NOTE — Progress Notes (Signed)
Subjective:    Patient ID: Megan Perez, female    DOB: 06/02/1946, 74 y.o.   MRN: [id number]  Megan Perez is a 74 y.o. female presenting on 05/31/2020 for medicare check up   HPI   Here for Annual Physical and due for labs  Chronic Lymphedema / Bilateral LE Edema Chronic problem, chart review since 2018+ prior PCP managing her edema. In 2018 was referred to [facility] - Cardiology Dr Adrian Blackwater had stress test and cardiac work-up that was unremarkable. Also had ECHO that showed Grade 1 diastolic dysfunction otherwise no cause of swelling. Renal function had been stable  Followed by Vascular, Dr Wyn Quaker [facility], last visit 04/09/20, they ordered Lymphedema pump, awaiting on this. Admits persistent ankle edema, significantly, Left worse than Right. Has had prior surgical on Left ankle years ago. She remains off Furosemide  QOD PRN. She said that this was making her dizziness with orthostatic hypotension at times, and now remains off this med, the swelling is not worse. Denies any chest pain, dyspnea at rest, coughing wheezing, arm pain, chest pressure, near syncope or lightheadedness  Chronic Low Back / Hip Pain, with radiating sciatica down Right leg to knee Followed by Dr Laban Emperor S/p prior radiofrequency ablation, had intolerance to 2nd dose. On Tizanidine 2 TID, now she switched it to night-time only. With helps relaxing her, and she avoids taking it during the day because it could make her dizzy and have some spinning, difficulty ambulation during day on this med if took it. - Now taking Pregabalin  x 2 =  nightly and Tizanidine  nightly.  CHRONIC HTN: Reports no elevated BP Current Meds - Lisinopril  daily   Reports good compliance, took meds today. Tolerating well, w/o complaints. Denies CP, dyspnea, HA, edema, dizziness / lightheadedness   PMH Insomnia, taking tizanidine, pregabalin at bedtime helps her sleep  HYPERLIPIDEMIA: Drug Induced  Myopathy  - Reports no concerns. Last lipid panel 05/2019, mostly normal still mild elevated LDL No longer on statin due to myalgia side effect atorvastatin    Health Maintenance: UTD COVID19 vaccine  Due for Flu Shot, will receive today    Depression screen [facility] 2/9 05/31/2020 03/18/2020 05/06/2019  Decreased Interest 0 0 0  Down, Depressed, Hopeless 0 0 0  PHQ - 2 Score 0 0 0  Altered sleeping - - -  Tired, decreased energy - - -  Change in appetite - - -  Feeling bad or failure about yourself  - - -  Trouble concentrating - - -  Moving slowly or fidgety/restless - - -  Suicidal thoughts - - -  PHQ-9 Score - - -    Past Medical History:  Diagnosis Date  . Anxiety   . Asthma    reactive airway and shortness of breath w/ exertion in hot temperatures  . Bronchitis   . GERD (gastroesophageal reflux disease)   . Hypertension   . Morbid obesity with BMI of 40.0-44.9, adult (HCC) 04/08/2012  . OA (osteoarthritis)    knee  . Osteoporosis   . Prediabetes   . Sinus infection    10/2018  . Venous (peripheral) insufficiency 08/19/2010   Qualifier: Diagnosis of  By: [name], [place]     Past Surgical History:  Procedure Laterality Date  . ABDOMINAL HYSTERECTOMY    . CARPAL TUNNEL RELEASE Right   . CHOLECYSTECTOMY    . COLONOSCOPY WITH PROPOFOL N/A 06/19/2017   Procedure: COLONOSCOPY WITH PROPOFOL;  Surgeon: [name], MD;  Location: [facility] ENDOSCOPY;  Service: Endoscopy;  Laterality: N/A;  . HEEL SPUR SURGERY    . REPLACEMENT TOTAL KNEE     left  . REPLACEMENT TOTAL KNEE Right   . SHOULDER ARTHROSCOPY WITH OPEN ROTATOR CUFF REPAIR Right 09/15/2015   Procedure: SHOULDER ARTHROSCOPY WITH OPEN ROTATOR CUFF REPAIR;  Surgeon: [name], MD;  Location: [facility] ORS;  Service: Orthopedics;  Laterality: Right;  . SHOULDER SURGERY Left    Social History   Socioeconomic History  . Marital status: Widowed    Spouse name: Not on file  . Number of children: Not on file  .  Years of education: Not on file  . Highest education level: Associate degree: academic program  Occupational History  . Not on file  Tobacco Use  . Smoking status: Never Smoker  . Smokeless tobacco: Never Used  Vaping Use  . Vaping Use: Never used  Substance and Sexual Activity  . Alcohol use: No  . Drug use: No  . Sexual activity: Yes    Birth control/protection: Surgical  Other Topics Concern  . Not on file  Social History Narrative   Retired.  Lives alone   Social Determinants of Health   Financial Resource Strain:   . Difficulty of Paying Living Expenses: Not on file  Food Insecurity:   . Worried About Programme researcher, broadcasting/film/video in the Last Year: Not on file  . Ran Out of Food in the Last Year: Not on file  Transportation Needs:   . Lack of Transportation (Medical): Not on file  . Lack of Transportation (Non-Medical): Not on file  Physical Activity:   . Days of Exercise per Week: Not on file  . Minutes of Exercise per Session: Not on file  Stress:   . Feeling of Stress : Not on file  Social Connections:   . Frequency of Communication with Friends and Family: Not on file  . Frequency of Social Gatherings with Friends and Family: Not on file  . Attends Religious Services: Not on file  . Active Member of Clubs or Organizations: Not on file  . Attends Banker Meetings: Not on file  . Marital Status: Not on file  Intimate Partner Violence:   . Fear of Current or Ex-Partner: Not on file  . Emotionally Abused: Not on file  . Physically Abused: Not on file  . Sexually Abused: Not on file   Family History  Adopted: Yes  Problem Relation Age of Onset  . Heart disease Father   . Subarachnoid hemorrhage Mother   . Hypertension Mother   . Subarachnoid hemorrhage Sister   . Hypertension Sister   . Hypertension Sister   . Hypertension Sister   . Healthy Sister   . Breast cancer Neg Hx    Current Outpatient Medications on File Prior to Visit  Medication Sig  .  acetaminophen (TYLENOL) 500 MG tablet Take 1,300 mg by mouth every 6 (six) hours as needed.   [name] aspirin EC 81 MG tablet Take 81 mg by mouth daily.  [name] azelastine (OPTIVAR) 0.05 % ophthalmic solution INT 1 GTT INTO OU BID FOR 10 DAYS  . calcium carbonate (CALCIUM 600) 600 MG TABS tablet Take 1 tablet (600 mg total) by mouth daily.  . diclofenac sodium (VOLTAREN) 1 % GEL APPLY 2 GRAMS TOPICALLY FOUR TIMES DAILY AS NEEDED FOR MODERATE PAIN OF BACK AND KNEES  . pantoprazole (PROTONIX) 20 MG tablet Take 1 tablet (20 mg total) by mouth daily before breakfast.  .  pregabalin (LYRICA) 25 MG capsule Take 2 capsules (50 mg total) by mouth at bedtime for 28 days, THEN 3 capsules (75 mg total) at bedtime for 28 days.  . sucralfate (CARAFATE) 1 g tablet TAKE 1 TABLET(1 GRAM) BY MOUTH FOUR TIMES DAILY AT BEDTIME WITH MEALS AS NEEDED  . tiZANidine (ZANAFLEX) 2 MG tablet Take 1 tablet (2 mg total) by mouth in the morning and at bedtime for 28 days, THEN 1 tablet (2 mg total) 3 (three) times daily for 28 days.   No current facility-administered medications on file prior to visit.    Review of Systems  Constitutional: Negative for activity change, appetite change, chills, diaphoresis, fatigue and fever.  HENT: Negative for congestion and hearing loss.   Eyes: Negative for visual disturbance.  Respiratory: Negative for apnea, cough, chest tightness, shortness of breath and wheezing.   Cardiovascular: Positive for leg swelling. Negative for chest pain and palpitations.  Gastrointestinal: Negative for abdominal pain, anal bleeding, blood in stool, constipation, diarrhea, nausea and vomiting.  Endocrine: Negative for cold intolerance.  Genitourinary: Negative for difficulty urinating, dysuria, frequency and hematuria.  Musculoskeletal: Negative for arthralgias, back pain and neck pain.  Skin: Negative for rash.  Allergic/Immunologic: Negative for environmental allergies.  Neurological: Negative for dizziness,  weakness, light-headedness, numbness and headaches.  Hematological: Negative for adenopathy.  Psychiatric/Behavioral: Negative for behavioral problems, dysphoric mood and sleep disturbance. The patient is not nervous/anxious.    Per HPI unless specifically indicated above      Objective:    BP (!) 119/57   Pulse 80   Temp (!) 97.1 F (36.2 C) (Temporal)   Resp 16   Ht 5\' 7"  (1.702 m)   Wt 279 lb (126.6 kg)   SpO2 99%   BMI 43.70 kg/m   Wt Readings from Last 3 Encounters:  05/31/20 279 lb (126.6 kg)  04/28/20 273 lb (123.8 kg)  04/09/20 (!) 274 lb (124.3 kg)    Physical Exam Vitals and nursing note reviewed.  Constitutional:      General: She is not in acute distress.    Appearance: She is well-developed. She is obese. She is not diaphoretic.     Comments: Well-appearing, comfortable, cooperative  HENT:     Head: Normocephalic and atraumatic.  Eyes:     General:        Right eye: No discharge.        Left eye: No discharge.     Conjunctiva/sclera: Conjunctivae normal.     Pupils: Pupils are equal, round, and reactive to light.  Neck:     Thyroid: No thyromegaly.  Cardiovascular:     Rate and Rhythm: Normal rate and regular rhythm.     Heart sounds: Normal heart sounds. No murmur heard.   Pulmonary:     Effort: Pulmonary effort is normal. No respiratory distress.     Breath sounds: Normal breath sounds. No wheezing or rales.  Abdominal:     General: Bowel sounds are normal. There is no distension.     Palpations: Abdomen is soft. There is no mass.     Tenderness: There is no abdominal tenderness.  Musculoskeletal:        General: No tenderness. Normal range of motion.     Cervical back: Normal range of motion and neck supple.     Right lower leg: Edema (+3 non pitting edema) present.     Left lower leg: Edema (+3 non pitting edema) present.     Comments: Upper / Lower  Extremities: - Normal muscle tone, strength bilateral upper extremities 5/5, lower extremities  5/5  Lymphadenopathy:     Cervical: No cervical adenopathy.  Skin:    General: Skin is warm and dry.     Findings: No erythema or rash.  Neurological:     Mental Status: She is alert and oriented to person, place, and time.     Comments: Distal sensation intact to light touch all extremities  Psychiatric:        Behavior: Behavior normal.     Comments: Well groomed, good eye contact, normal speech and thoughts        Results for orders placed or performed in visit on 05/28/19  Hemoglobin A1c  Result Value Ref Range   Hgb A1c MFr Bld 6.1 (H) <5.7 % of total Hgb   Mean Plasma Glucose 128 (calc)   eAG (mmol/L) 7.1 (calc)  Lipid panel  Result Value Ref Range   Cholesterol 189 <200 mg/dL   HDL 60 > OR = 50 mg/dL   Triglycerides 66 <242 mg/dL   LDL Cholesterol (Calc) 114 (H) mg/dL (calc)   Total CHOL/HDL Ratio 3.2 <5.0 (calc)   Non-HDL Cholesterol (Calc) 129 <130 mg/dL (calc)  COMPLETE METABOLIC PANEL WITH GFR  Result Value Ref Range   Glucose, Bld 98 65 - 99 mg/dL   BUN 19 7 - 25 mg/dL   Creat 6.83 (H) 4.19 - 0.93 mg/dL   GFR, Est Non African American 54 (L) > OR = 60 mL/min/1.34m2   GFR, Est African American 62 > OR = 60 mL/min/1.46m2   BUN/Creatinine Ratio 18 6 - 22 (calc)   Sodium 142 135 - 146 mmol/L   Potassium 5.1 3.5 - 5.3 mmol/L   Chloride 105 98 - 110 mmol/L   CO2 27 20 - 32 mmol/L   Calcium 9.7 8.6 - 10.4 mg/dL   Total Protein 6.8 6.1 - 8.1 g/dL   Albumin 4.0 3.6 - 5.1 g/dL   Globulin 2.8 1.9 - 3.7 g/dL (calc)   AG Ratio 1.4 1.0 - 2.5 (calc)   Total Bilirubin 0.3 0.2 - 1.2 mg/dL   Alkaline phosphatase (APISO) 52 37 - 153 U/L   AST 11 10 - 35 U/L   ALT 8 6 - 29 U/L      Assessment & Plan:   Problem List Items Addressed This Visit    Pure hypercholesterolemia    Elevated LDL in past Due for fasting lipid on 9/16 Previously failed atorvastatin 20mg  due to statin myalgia See documentation  Reconsider lower dose in future      Relevant Medications    lisinopril (ZESTRIL) 20 MG tablet   Other Relevant Orders   Lipid panel   TSH   Psychophysiological insomnia    Chronic problem Improved on some medications with tizanidine / pregabalin to help her rest and treat her pain No changes today      Prediabetes    Due for repeat A1c, return for labs 9/16      Relevant Orders   Hemoglobin A1c   Morbid obesity with BMI of 40.0-44.9, adult (HCC)    Abnormal weight BMI >43 Worse with lymphedema fluid weight Wt 279 lbs up in past 3 months      Relevant Orders   COMPLETE METABOLIC PANEL WITH GFR   Lipid panel   TSH   Lymphedema of both lower extremities    Chronic problem, lymphedema advanced Refractory to compression and prior conservative therapy Followed by Dr 07-01-1982 [facility]  Awaiting lymphedema pump now. Remain off diuretic lasix as was causing significant hypotension / dizziness side effect and limited swelling relief      Essential hypertension    Well-controlled HTN - Home BP readings normal  Morbid Obesity, Lymphedema   Plan:  1. Continue current BP regimen Lisinopril 20mg  daily re ordered / add refills 2. Encourage improved lifestyle - low sodium diet, regular exercise 3. Continue monitor BP outside office, bring readings to next visit, if persistently >140/90 or new symptoms notify office sooner      Relevant Medications   lisinopril (ZESTRIL) 20 MG tablet   Other Relevant Orders   CBC with Differential/Platelet   COMPLETE METABOLIC PANEL WITH GFR   Drug-induced myopathy    Secondary to myalgia drug myopathy on atorvastatin 20mg  Remains off statin Offered repeat lower dose or intermittent, declines for now  Due for fasting lipid panel  The 10-year ASCVD risk score Denman George(Goff DC Jr., et al., 2013) is: 13.6%        Other Visit Diagnoses    Annual physical exam    -  Primary   Relevant Orders   Hemoglobin A1c   CBC with Differential/Platelet   COMPLETE METABOLIC PANEL WITH GFR   Lipid panel   TSH   Needs  flu shot       Relevant Orders   Flu Vaccine QUAD High Dose(Fluad)      Updated Health Maintenance information - Flu shot today, high dose - UTD COVID vaccine Reviewed recent lab results with patient Encouraged improvement to lifestyle with diet and exercise - Goal of weight loss    Meds ordered this encounter  Medications  . lisinopril (ZESTRIL) 20 MG tablet    Sig: Take 1 tablet (20 mg total) by mouth daily.    Dispense:  90 tablet    Refill:  3    Add refills on file     Follow up plan: Return in about 6 months (around 11/28/2020) for 6 month follow-up HTN, Lymphedema, Pain.   Future labs ordered and released, return on 06/03/20 for physical labs  Saralyn PilarAlexander Jarius Dieudonne, DO [facility] 05/31/2020, 11:15 AM

## 2020-05-31 NOTE — Telephone Encounter (Signed)
On 7/23 the pt saw Dr. Wyn Quaker for lymphedema and per the providers note he was going to put her in compression stockings and start her on lymph pumps. The pt called today and said that she was in contact with the specialist who is going to fit her for the pumps and was told they need the order. Please advise.

## 2020-06-01 NOTE — Telephone Encounter (Signed)
A order for the pump was faxed on 04/13/20 and a record of the order was in the chart under the media tab. This was printed off and refaxed along with progress note and demographics.

## 2020-06-02 ENCOUNTER — Other Ambulatory Visit: Payer: Self-pay | Admitting: *Deleted

## 2020-06-02 DIAGNOSIS — I10 Essential (primary) hypertension: Secondary | ICD-10-CM

## 2020-06-02 DIAGNOSIS — R7303 Prediabetes: Secondary | ICD-10-CM

## 2020-06-02 DIAGNOSIS — E7800 Pure hypercholesterolemia, unspecified: Secondary | ICD-10-CM

## 2020-06-02 DIAGNOSIS — Z6841 Body Mass Index (BMI) 40.0 and over, adult: Secondary | ICD-10-CM

## 2020-06-03 ENCOUNTER — Other Ambulatory Visit: Payer: Self-pay

## 2020-06-03 ENCOUNTER — Other Ambulatory Visit: Payer: Medicare HMO

## 2020-06-03 DIAGNOSIS — R7303 Prediabetes: Secondary | ICD-10-CM | POA: Diagnosis not present

## 2020-06-03 DIAGNOSIS — Z6841 Body Mass Index (BMI) 40.0 and over, adult: Secondary | ICD-10-CM | POA: Diagnosis not present

## 2020-06-03 DIAGNOSIS — E7800 Pure hypercholesterolemia, unspecified: Secondary | ICD-10-CM | POA: Diagnosis not present

## 2020-06-03 DIAGNOSIS — I10 Essential (primary) hypertension: Secondary | ICD-10-CM | POA: Diagnosis not present

## 2020-06-04 LAB — LIPID PANEL
Cholesterol: 204 mg/dL — ABNORMAL HIGH (ref <200)
HDL: 63 mg/dL (ref >=50)
LDL Cholesterol (Calc): 124 mg/dL (calc) — ABNORMAL HIGH
Non-HDL Cholesterol (Calc): 141 mg/dL (calc) — ABNORMAL HIGH (ref <130)
Total CHOL/HDL Ratio: 3.2 (calc) (ref <5.0)
Triglycerides: 76 mg/dL (ref <150)

## 2020-06-04 LAB — CBC WITH DIFFERENTIAL/PLATELET
Absolute Monocytes: 421 cells/uL (ref 200–950)
Basophils Absolute: 59 cells/uL (ref 0–200)
Basophils Relative: 1.2 %
Eosinophils Absolute: 172 cells/uL (ref 15–500)
Eosinophils Relative: 3.5 %
HCT: 35.3 % (ref 35.0–45.0)
Hemoglobin: 11.5 g/dL — ABNORMAL LOW (ref 11.7–15.5)
Lymphs Abs: 1803 cells/uL (ref 850–3900)
MCH: 30.4 pg (ref 27.0–33.0)
MCHC: 32.6 g/dL (ref 32.0–36.0)
MCV: 93.4 fL (ref 80.0–100.0)
MPV: 10.2 fL (ref 7.5–12.5)
Monocytes Relative: 8.6 %
Neutro Abs: 2445 cells/uL (ref 1500–7800)
Neutrophils Relative %: 49.9 %
Platelets: 286 10*3/uL (ref 140–400)
RBC: 3.78 10*6/uL — ABNORMAL LOW (ref 3.80–5.10)
RDW: 13.7 % (ref 11.0–15.0)
Total Lymphocyte: 36.8 %
WBC: 4.9 10*3/uL (ref 3.8–10.8)

## 2020-06-04 LAB — HEMOGLOBIN A1C
Hgb A1c MFr Bld: 6 % of total Hgb — ABNORMAL HIGH (ref <5.7)
Mean Plasma Glucose: 126 (calc)
eAG (mmol/L): 7 (calc)

## 2020-06-04 LAB — COMPLETE METABOLIC PANEL WITH GFR
AG Ratio: 1.6 (calc) (ref 1.0–2.5)
ALT: 10 U/L (ref 6–29)
AST: 11 U/L (ref 10–35)
Albumin: 3.9 g/dL (ref 3.6–5.1)
Alkaline phosphatase (APISO): 72 U/L (ref 37–153)
BUN: 13 mg/dL (ref 7–25)
CO2: 27 mmol/L (ref 20–32)
Calcium: 9 mg/dL (ref 8.6–10.4)
Chloride: 107 mmol/L (ref 98–110)
Creat: 0.75 mg/dL (ref 0.60–0.93)
GFR, Est African American: 90 mL/min/{1.73_m2} (ref >=60)
GFR, Est Non African American: 78 mL/min/{1.73_m2} (ref >=60)
Globulin: 2.5 g/dL (calc) (ref 1.9–3.7)
Glucose, Bld: 91 mg/dL (ref 65–99)
Potassium: 3.9 mmol/L (ref 3.5–5.3)
Sodium: 144 mmol/L (ref 135–146)
Total Bilirubin: 0.3 mg/dL (ref 0.2–1.2)
Total Protein: 6.4 g/dL (ref 6.1–8.1)

## 2020-06-04 LAB — TSH: TSH: 2.2 mIU/L (ref 0.40–4.50)

## 2020-06-09 ENCOUNTER — Encounter: Payer: Medicare HMO | Admitting: Pain Medicine

## 2020-06-16 DIAGNOSIS — I890 Lymphedema, not elsewhere classified: Secondary | ICD-10-CM | POA: Diagnosis not present

## 2020-06-18 ENCOUNTER — Encounter: Payer: Self-pay | Admitting: Pain Medicine

## 2020-06-20 NOTE — Progress Notes (Signed)
Patient: Megan Perez  Service Category: E/M  Provider: Gaspar Cola, MD  DOB: Feb 05, 1946  DOS: 06/21/2020  Location: Office  MRN: [id number]  Setting: Ambulatory outpatient  Referring Provider: Nobie Putnam *  Type: Established Patient  Specialty: Interventional Pain Management  PCP: Olin Hauser, DO  Location: Remote location  Delivery: TeleHealth     Virtual Encounter - Pain Management PROVIDER NOTE: Information contained herein reflects review and annotations entered in association with encounter. Interpretation of such information and data should be left to medically-trained personnel. Information provided to patient can be located elsewhere in the medical record under "Patient Instructions". Document created using STT-dictation technology, any transcriptional errors that may result from process are unintentional.    Contact & Pharmacy Preferred: [phone] Home: [phone] (home) Mobile: [phone] (mobile) E-mail: [email or web address]  [place], [place] [address] [place] [address] Phone: [phone] Fax: [phone]  [facility] [place], [place] - [address] [place] [address] Phone: [phone] Fax: [phone]   Pre-screening  Megan Perez offered "in-person" vs "virtual" encounter. She indicated preferring virtual for this encounter.   Reason COVID-19*  Social distancing based on CDC and AMA recommendations.   I contacted Megan Perez on 06/21/2020 via telephone.      I clearly identified myself as Gaspar Cola, MD. I verified that I was speaking with the correct person using two identifiers (Name: Megan Perez, and date of birth: 06-28-1946).  Consent I sought verbal advanced consent from Megan Perez for virtual visit interactions. I informed Megan Perez of possible security and privacy  concerns, risks, and limitations associated with providing "not-in-person" medical evaluation and management services. I also informed Megan Perez of the availability of "in-person" appointments. Finally, I informed her that there would be a charge for the virtual visit and that she could be  personally, fully or partially, financially responsible for it. Megan Perez expressed understanding and agreed to proceed.   Historic Elements   Megan Perez is a 74 y.o. year old, female patient evaluated today after our last contact on 04/28/2020. Megan Perez  has a past medical history of Anxiety, Asthma, Bronchitis, GERD (gastroesophageal reflux disease), Hypertension, Morbid obesity with BMI of 40.0-44.9, adult ([place]) (04/08/2012), OA (osteoarthritis), Osteoporosis, Prediabetes, Sinus infection, and Venous (peripheral) insufficiency (08/19/2010). She also  has a past surgical history that includes Abdominal hysterectomy; Cholecystectomy; Replacement total knee; Heel spur surgery; Shoulder surgery (Left); Shoulder arthroscopy with open rotator cuff repair (Right, 09/15/2015); Replacement total knee (Right); Carpal tunnel release (Right); and Colonoscopy with propofol (N/A, 06/19/2017). Megan Perez has a current medication list which includes the following prescription(s): acetaminophen, aspirin ec, azelastine, diclofenac sodium, lisinopril, pantoprazole, [START ON 06/23/2020] pregabalin, sucralfate, [START ON 06/23/2020] tizanidine, and calcium carbonate. She  reports that she has never smoked. She has never used smokeless tobacco. She reports that she does not drink alcohol and does not use drugs. Megan Perez is allergic to baclofen, meclizine, other, penicillins, prednisone, prevacid [lansoprazole], gabapentin, and oxycodone.   HPI  Today, she is being contacted for medication management.  The patient was scheduled to return for evaluation with the Lyrica and Zanaflex titration.  On 04/28/2020 I provided the patient with a  prescription for tizanidine (Zanaflex) 2 mg tablet, 1 tab p.o. daily to progress slowly to 1 tab p.o. 3 times daily.  I also provided the patient with a prescription for pregabalin (Lyrica) 25 mg capsule, to take 2 capsules p.o. at bedtime with instructions to progress up to 3 capsules (75 mg) at bedtime.  Today the patient reports that she is not having any problems with the medications and she is already taking Lyrica 25 mg capsule, 3 capsules p.o. at bedtime.  She refers that it is helping, but she is still waking up at 4:00 in the morning with the pain.  She also indicated that she was unable to take the medication 3 times daily because it made her very sleepy.  However, when she takes it all at bedtime it actually helps her sleep at night and she is no longer feeling sleepy during the day.  Regarding the tizanidine, she has been taking the 2 mg tablet, 1 tablet p.o. at bedtime, but she is wondering whether or not she can go higher.  She also does not want to take it during the day because of the sedative effects and she was wondering if we can increase it at bedtime.  I told the patient to first increase the Lyrica and if that is not enough and she feels that she needs a little bit more help, then she can go ahead and increase the tizanidine from 2 mg to 4 mg at bedtime.  Today I will provide the patient with enough medication to last for 60 days at which time I will contact her again to further adjust the medicine, if needed.  Once we get the medicine under control, we will transfer these nonopioids to her PCP.  Pharmacotherapy Assessment  Analgesic: No opioid analgesics from our practice.   Monitoring: [place] PMP: PDMP reviewed during this encounter.       Pharmacotherapy: No side-effects or adverse reactions reported. Compliance: No problems identified. Effectiveness: Clinically acceptable. Plan: Refer to "POC".  UDS:  Summary  Date Value Ref Range Status  11/05/2018 FINAL  Final    Comment:     ==================================================================== [facility] COMP DRUG ANALYSIS,UR ==================================================================== Test                             Result       Flag       Units Drug Present and Declared for Prescription Verification   Acetaminophen                  PRESENT      EXPECTED Drug Absent but Declared for Prescription Verification   Baclofen                       Not Detected UNEXPECTED   Diclofenac                     Not Detected UNEXPECTED    Topical diclofenac, as indicated in the declared medication list,    is not always detected even when used as directed.   Salicylate                     Not Detected UNEXPECTED    Aspirin, as indicated in the declared medication list, is not    always detected even when used as directed. ==================================================================== Test                      Result    Flag   Units      Ref Range  Creatinine              93               mg/dL      >=20 ==================================================================== Declared Medications:  The flagging and interpretation on this report are based on the  following declared medications.  Unexpected results may arise from  inaccuracies in the declared medications.  **Note: The testing scope of this panel includes these medications:  Baclofen  **Note: The testing scope of this panel does not include small to  moderate amounts of these reported medications:  Acetaminophen  Aspirin (Aspirin 81)  Topical Diclofenac  **Note: The testing scope of this panel does not include following  reported medications:  Azelastine  Furosemide  Lisinopril  Pantoprazole  Potassium  Pravastatin  Sucralfate ==================================================================== For clinical consultation, please call [phone]. ====================================================================     Laboratory  Chemistry Profile   Renal Lab Results  Component Value Date   BUN 13 06/03/2020   CREATININE 0.75 33/82/5053   BCR NOT APPLICABLE 97/67/3419   GFRAA 90 06/03/2020   GFRNONAA 78 06/03/2020     Hepatic Lab Results  Component Value Date   AST 11 06/03/2020   ALT 10 06/03/2020   ALBUMIN 3.9 01/16/2017   ALKPHOS 73 01/16/2017   HCVAB NEGATIVE 02/16/2016     Electrolytes Lab Results  Component Value Date   NA 144 06/03/2020   K 3.9 06/03/2020   CL 107 06/03/2020   CALCIUM 9.0 06/03/2020   MG 2.1 11/05/2018     Bone Lab Results  Component Value Date   25OHVITD1 13 (L) 11/05/2018   25OHVITD2 <1.0 11/05/2018   25OHVITD3 13 11/05/2018     Inflammation (CRP: Acute Phase) (ESR: Chronic Phase) Lab Results  Component Value Date   CRP 11 (H) 11/05/2018   ESRSEDRATE 30 11/05/2018       Note: Above Lab results reviewed.  Imaging  VAS [place] LOWER EXTREMITY VENOUS REFLUX  Lower Venous Reflux Study  Performing Technologist: Almira Coaster RVS    Examination Guidelines: A complete evaluation includes B-mode imaging, spectral Doppler, color Doppler, and power Doppler as needed of all accessible portions of each vessel. Bilateral testing is considered an integral part of a complete examination. Limited examinations for reoccurring indications may be performed as noted. The reflux portion of the exam is performed with the patient in reverse Trendelenburg. Significant venous reflux is defined as >500 ms in the superficial venous system, and >1 second in the deep venous system.    Venous Reflux Times +--------------+---------+------+-----------+------------+--------+ RIGHT         Reflux NoRefluxReflux TimeDiameter cmsComments                         Yes                                  +--------------+---------+------+-----------+------------+--------+ CFV           no                                              +--------------+---------+------+-----------+------------+--------+ FV prox       no                                             +--------------+---------+------+-----------+------------+--------+  FV mid        no                                             +--------------+---------+------+-----------+------------+--------+ FV dist       no                                             +--------------+---------+------+-----------+------------+--------+ Popliteal     no                                             +--------------+---------+------+-----------+------------+--------+ GSV at SFJ    no                            .67              +--------------+---------+------+-----------+------------+--------+ GSV prox thighno                            .70              +--------------+---------+------+-----------+------------+--------+ GSV mid thigh no                            .41              +--------------+---------+------+-----------+------------+--------+ GSV dist thighno                            .54              +--------------+---------+------+-----------+------------+--------+ GSV at knee   no                            .48              +--------------+---------+------+-----------+------------+--------+ GSV prox calf no                            .43              +--------------+---------+------+-----------+------------+--------+ SSV Pop Fossa no                            .39              +--------------+---------+------+-----------+------------+--------+    +--------------+---------+------+-----------+------------+--------+ LEFT          Reflux NoRefluxReflux TimeDiameter cmsComments                         Yes                                  +--------------+---------+------+-----------+------------+--------+ CFV           no                                              +--------------+---------+------+-----------+------------+--------+  FV prox       no                                             +--------------+---------+------+-----------+------------+--------+ FV mid        no                                             +--------------+---------+------+-----------+------------+--------+ FV dist       no                                             +--------------+---------+------+-----------+------------+--------+ Popliteal     no                                             +--------------+---------+------+-----------+------------+--------+ GSV at SFJ    no                            1.11             +--------------+---------+------+-----------+------------+--------+ GSV prox thighno                            .71              +--------------+---------+------+-----------+------------+--------+ GSV mid thigh no                            .53              +--------------+---------+------+-----------+------------+--------+ GSV dist thighno                            .44              +--------------+---------+------+-----------+------------+--------+ GSV at knee   no                            .40              +--------------+---------+------+-----------+------------+--------+ GSV prox calf no                            .39              +--------------+---------+------+-----------+------------+--------+ SSV Pop Fossa no                            .71              +--------------+---------+------+-----------+------------+--------+        Summary: Bilateral: - No evidence of deep vein thrombosis seen in the lower extremities, bilaterally, from the common femoral through the popliteal veins.    - No evidence of superficial venous thrombosis in the lower extremities, bilaterally.   - No evidence of deep venous insufficiency seen bilaterally in the lower extremity.   -  No  evidence of superficial venous reflux seen in the greater saphenous veins bilaterally.   - No evidence of superficial venous reflux seen in the short saphenous veins bilaterally.   *See table(s) above for measurements and observations.  Electronically signed by Leotis Pain MD on 04/13/2020 at 10:45:22 AM.      Final    Assessment  The primary encounter diagnosis was Chronic pain syndrome. Diagnoses of Pharmacologic therapy, Chronic low back pain (Primary Area of Pain) (Bilateral) (R>L) w/o sciatica, Muscle spasms of both lower extremities, Chronic musculoskeletal pain, Chronic peripheral neuropathic pain, and Neurogenic pain were also pertinent to this visit.  Plan of Care  Problem-specific:  No problem-specific Assessment & Plan notes found for this encounter.  Megan Perez has a current medication list which includes the following long-term medication(s): lisinopril, pantoprazole, [START ON 06/23/2020] pregabalin, sucralfate, [START ON 06/23/2020] tizanidine, and calcium carbonate.  Pharmacotherapy (Medications Ordered): Meds ordered this encounter  Medications  . tiZANidine (ZANAFLEX) 2 MG tablet    Sig: Take 2 tablets (4 mg total) by mouth at bedtime.    Dispense:  60 tablet    Refill:  1    Do not place this medication, or any other prescription from our practice, on "Automatic Refill". Patient may have prescription filled one day early if pharmacy is closed on scheduled refill date.  . pregabalin (LYRICA) 50 MG capsule    Sig: Take 2 capsules (100 mg total) by mouth at bedtime.    Dispense:  60 capsule    Refill:  1    Fill one day early if pharmacy is closed on scheduled refill date. May substitute for generic if available.   Orders:  No orders of the defined types were placed in this encounter.  Follow-up plan:   Return in about 2 months (around 08/21/2020) for (VV), (Med Mgmt) to further adjust Lyrica and Zanaflex.      Interventional treatment options:  Under  consideration:   NOTE: NO further RFA attempts until she brings her BMI<35. (Technically difficult procedure due to intra-procedural non-compliance and very unreliable feedback.) Therapeutic left lumbar facet RFA #1 (today) Diagnostic bilateral SI joint blocks  Possible bilateral SI joint RFA  Diagnostic right L4 TFESI  Diagnostic right L3-4 LESI  Diagnostic bilateral genicular NB  Possible bilateral genicular nerve RFA    Therapeutic/palliative (PRN):   Diagnostic bilateral lumbar facet block #3  Palliative right lumbar facet RFA #2 (last done 08/07/2019) (Do not repeat until BMI<35) Palliative left lumbar facet RFA #2 (last done 09/02/2019) (Do not repeat until BMI<35)         Recent Visits Date Type Provider Dept  04/28/20 Office Visit Milinda Pointer, MD [facility]  Showing recent visits within past 90 days and meeting all other requirements Today's Visits Date Type Provider Dept  06/21/20 Office Visit Milinda Pointer, MD [facility]  Showing today's visits and meeting all other requirements Future Appointments No visits were found meeting these conditions. Showing future appointments within next 90 days and meeting all other requirements  I discussed the assessment and treatment plan with the patient. The patient was provided an opportunity to ask questions and all were answered. The patient agreed with the plan and demonstrated an understanding of the instructions.  Patient advised to call back or seek an in-person evaluation if the symptoms or condition worsens.  Duration of encounter: 15 minutes.  Note by: Gaspar Cola, MD Date: 06/21/2020; Time: 10:01 AM

## 2020-06-21 ENCOUNTER — Other Ambulatory Visit: Payer: Self-pay

## 2020-06-21 ENCOUNTER — Ambulatory Visit: Payer: Medicare HMO | Attending: Pain Medicine | Admitting: Pain Medicine

## 2020-06-21 DIAGNOSIS — M792 Neuralgia and neuritis, unspecified: Secondary | ICD-10-CM | POA: Diagnosis not present

## 2020-06-21 DIAGNOSIS — Z79899 Other long term (current) drug therapy: Secondary | ICD-10-CM | POA: Diagnosis not present

## 2020-06-21 DIAGNOSIS — M62838 Other muscle spasm: Secondary | ICD-10-CM | POA: Diagnosis not present

## 2020-06-21 DIAGNOSIS — G894 Chronic pain syndrome: Secondary | ICD-10-CM | POA: Diagnosis not present

## 2020-06-21 DIAGNOSIS — M545 Low back pain, unspecified: Secondary | ICD-10-CM

## 2020-06-21 DIAGNOSIS — M7918 Myalgia, other site: Secondary | ICD-10-CM | POA: Diagnosis not present

## 2020-06-21 DIAGNOSIS — G8929 Other chronic pain: Secondary | ICD-10-CM | POA: Diagnosis not present

## 2020-06-21 MED ORDER — TIZANIDINE HCL 2 MG PO TABS: 4.0000 mg | ORAL_TABLET | Freq: Every day | ORAL | 1 refills | Status: DC

## 2020-06-21 MED ORDER — PREGABALIN 50 MG PO CAPS: 100.0000 mg | ORAL_CAPSULE | Freq: Every day | ORAL | 1 refills | Status: DC

## 2020-06-29 ENCOUNTER — Ambulatory Visit (INDEPENDENT_AMBULATORY_CARE_PROVIDER_SITE_OTHER): Payer: Medicare HMO

## 2020-06-29 VITALS — Ht 67.0 in | Wt 275.0 lb

## 2020-06-29 DIAGNOSIS — Z0000 Encounter for general adult medical examination without abnormal findings: Secondary | ICD-10-CM | POA: Diagnosis not present

## 2020-06-29 NOTE — Patient Instructions (Signed)
Megan Perez , Thank you for taking time to come for your Medicare Wellness Visit. I appreciate your ongoing commitment to your health goals. Please review the following plan we discussed and let me know if I can assist you in the future.   Screening recommendations/referrals: Colonoscopy: not required Mammogram: not required Bone Density: completed per patient Recommended yearly ophthalmology/optometry visit for glaucoma screening and checkup Recommended yearly dental visit for hygiene and checkup  Vaccinations: Influenza vaccine: completed 05/31/2020 Pneumococcal vaccine: completed 06/22/2015 Tdap vaccine: due Shingles vaccine: completed   Covid-19: 12/13/2019, 11/15/2019  Advanced directives: Advance directive discussed with you today.    Conditions/risks identified: none  Next appointment: Follow up in one year for your annual wellness visit    Preventive Care 65 Years and Older, Female Preventive care refers to lifestyle choices and visits with your health care provider that can promote health and wellness. What does preventive care include?  A yearly physical exam. This is also called an annual well check.  Dental exams once or twice a year.  Routine eye exams. Ask your health care provider how often you should have your eyes checked.  Personal lifestyle choices, including:  Daily care of your teeth and gums.  Regular physical activity.  Eating a healthy diet.  Avoiding tobacco and drug use.  Limiting alcohol use.  Practicing safe sex.  Taking low-dose aspirin every day.  Taking vitamin and mineral supplements as recommended by your health care provider. What happens during an annual well check? The services and screenings done by your health care provider during your annual well check will depend on your age, overall health, lifestyle risk factors, and family history of disease. Counseling  Your health care provider may ask you questions about your:  Alcohol  use.  Tobacco use.  Drug use.  Emotional well-being.  Home and relationship well-being.  Sexual activity.  Eating habits.  History of falls.  Memory and ability to understand (cognition).  Work and work Astronomer.  Reproductive health. Screening  You may have the following tests or measurements:  Height, weight, and BMI.  Blood pressure.  Lipid and cholesterol levels. These may be checked every 5 years, or more frequently if you are over 31 years old.  Skin check.  Lung cancer screening. You may have this screening every year starting at age 82 if you have a 30-pack-year history of smoking and currently smoke or have quit within the past 15 years.  Fecal occult blood test (FOBT) of the stool. You may have this test every year starting at age 26.  Flexible sigmoidoscopy or colonoscopy. You may have a sigmoidoscopy every 5 years or a colonoscopy every 10 years starting at age 74.  Hepatitis C blood test.  Hepatitis B blood test.  Sexually transmitted disease (STD) testing.  Diabetes screening. This is done by checking your blood sugar (glucose) after you have not eaten for a while (fasting). You may have this done every 1-3 years.  Bone density scan. This is done to screen for osteoporosis. You may have this done starting at age 37.  Mammogram. This may be done every 1-2 years. Talk to your health care provider about how often you should have regular mammograms. Talk with your health care provider about your test results, treatment options, and if necessary, the need for more tests. Vaccines  Your health care provider may recommend certain vaccines, such as:  Influenza vaccine. This is recommended every year.  Tetanus, diphtheria, and acellular pertussis (Tdap, Td) vaccine.  You may need a Td booster every 10 years.  Zoster vaccine. You may need this after age 29.  Pneumococcal 13-valent conjugate (PCV13) vaccine. One dose is recommended after age  37.  Pneumococcal polysaccharide (PPSV23) vaccine. One dose is recommended after age 102. Talk to your health care provider about which screenings and vaccines you need and how often you need them. This information is not intended to replace advice given to you by your health care provider. Make sure you discuss any questions you have with your health care provider. Document Released: 10/01/2015 Document Revised: 05/24/2016 Document Reviewed: 07/06/2015 Elsevier Interactive Patient Education  2017 [place] Prevention in the Home Falls can cause injuries. They can happen to people of all ages. There are many things you can do to make your home safe and to help prevent falls. What can I do on the outside of my home?  Regularly fix the edges of walkways and driveways and fix any cracks.  Remove anything that might make you trip as you walk through a door, such as a raised step or threshold.  Trim any bushes or trees on the path to your home.  Use bright outdoor lighting.  Clear any walking paths of anything that might make someone trip, such as rocks or tools.  Regularly check to see if handrails are loose or broken. Make sure that both sides of any steps have handrails.  Any raised decks and porches should have guardrails on the edges.  Have any leaves, snow, or ice cleared regularly.  Use sand or salt on walking paths during winter.  Clean up any spills in your garage right away. This includes oil or grease spills. What can I do in the bathroom?  Use night lights.  Install grab bars by the toilet and in the tub and shower. Do not use towel bars as grab bars.  Use non-skid mats or decals in the tub or shower.  If you need to sit down in the shower, use a plastic, non-slip stool.  Keep the floor dry. Clean up any water that spills on the floor as soon as it happens.  Remove soap buildup in the tub or shower regularly.  Attach bath mats securely with double-sided  non-slip rug tape.  Do not have throw rugs and other things on the floor that can make you trip. What can I do in the bedroom?  Use night lights.  Make sure that you have a light by your bed that is easy to reach.  Do not use any sheets or blankets that are too big for your bed. They should not hang down onto the floor.  Have a firm chair that has side arms. You can use this for support while you get dressed.  Do not have throw rugs and other things on the floor that can make you trip. What can I do in the kitchen?  Clean up any spills right away.  Avoid walking on wet floors.  Keep items that you use a lot in easy-to-reach places.  If you need to reach something above you, use a strong step stool that has a grab bar.  Keep electrical cords out of the way.  Do not use floor polish or wax that makes floors slippery. If you must use wax, use non-skid floor wax.  Do not have throw rugs and other things on the floor that can make you trip. What can I do with my stairs?  Do not leave any  items on the stairs.  Make sure that there are handrails on both sides of the stairs and use them. Fix handrails that are broken or loose. Make sure that handrails are as long as the stairways.  Check any carpeting to make sure that it is firmly attached to the stairs. Fix any carpet that is loose or worn.  Avoid having throw rugs at the top or bottom of the stairs. If you do have throw rugs, attach them to the floor with carpet tape.  Make sure that you have a light switch at the top of the stairs and the bottom of the stairs. If you do not have them, ask someone to add them for you. What else can I do to help prevent falls?  Wear shoes that:  Do not have high heels.  Have rubber bottoms.  Are comfortable and fit you well.  Are closed at the toe. Do not wear sandals.  If you use a stepladder:  Make sure that it is fully opened. Do not climb a closed stepladder.  Make sure that both  sides of the stepladder are locked into place.  Ask someone to hold it for you, if possible.  Clearly mark and make sure that you can see:  Any grab bars or handrails.  First and last steps.  Where the edge of each step is.  Use tools that help you move around (mobility aids) if they are needed. These include:  Canes.  Walkers.  Scooters.  Crutches.  Turn on the lights when you go into a dark area. Replace any light bulbs as soon as they burn out.  Set up your furniture so you have a clear path. Avoid moving your furniture around.  If any of your floors are uneven, fix them.  If there are any pets around you, be aware of where they are.  Review your medicines with your doctor. Some medicines can make you feel dizzy. This can increase your chance of falling. Ask your doctor what other things that you can do to help prevent falls. This information is not intended to replace advice given to you by your health care provider. Make sure you discuss any questions you have with your health care provider. Document Released: 07/01/2009 Document Revised: 02/10/2016 Document Reviewed: 10/09/2014 Elsevier Interactive Patient Education  2017 [employer].

## 2020-06-29 NOTE — Progress Notes (Signed)
I connected with Megan Perez today by telephone and verified that I am speaking with the correct person using two identifiers. Location patient: home Location provider: work Persons participating in the virtual visit: Megan Perez, Megan Ponder LPN.   I discussed the limitations, risks, security and privacy concerns of performing an evaluation and management service by telephone and the availability of in person appointments. I also discussed with the patient that there may be a patient responsible charge related to this service. The patient expressed understanding and verbally consented to this telephonic visit.    Interactive audio and video telecommunications were attempted between this provider and patient, however failed, due to patient having technical difficulties OR patient did not have access to video capability.  We continued and completed visit with audio only.     Vital signs may be patient reported or missing  Subjective:   Megan Perez is a 74 y.o. female who presents for Medicare Annual (Subsequent) preventive examination.  Review of Systems     Cardiac Risk Factors include: advanced age (>51men, >65 women);obesity (BMI >30kg/m2);sedentary lifestyle     Objective:    Today's Vitals   06/29/20 1436 06/29/20 1437  Weight: 275 lb (124.7 kg)   Height: 5\' 7"  (1.702 m)   PainSc:  6    Body mass index is 43.07 kg/m.  Advanced Directives 06/29/2020 03/18/2020 08/07/2019 05/06/2019 03/04/2019 04/23/2018 06/19/2017  Does Patient Have a Medical Advance Directive? No No No No No No No  Would patient like information on creating a medical advance directive? - No - Patient declined - - No - Patient declined Yes (MAU/Ambulatory/Procedural Areas - Information given) -    Current Medications (verified) Outpatient Encounter Medications as of 06/29/2020  Medication Sig  . acetaminophen (TYLENOL) 500 MG tablet Take 1,300 mg by mouth every 6 (six) hours as needed.   08/29/2020 aspirin EC 81 MG  tablet Take 81 mg by mouth daily.  [name] azelastine (OPTIVAR) 0.05 % ophthalmic solution INT 1 GTT INTO OU BID FOR 10 DAYS  . diclofenac sodium (VOLTAREN) 1 % GEL APPLY 2 GRAMS TOPICALLY FOUR TIMES DAILY AS NEEDED FOR MODERATE PAIN OF BACK AND KNEES  . lisinopril (ZESTRIL) 20 MG tablet Take 1 tablet (20 mg total) by mouth daily.  . pantoprazole (PROTONIX) 20 MG tablet Take 1 tablet (20 mg total) by mouth daily before breakfast.  . pregabalin (LYRICA) 50 MG capsule Take 2 capsules (100 mg total) by mouth at bedtime.  . sucralfate (CARAFATE) 1 g tablet TAKE 1 TABLET(1 GRAM) BY MOUTH FOUR TIMES DAILY AT BEDTIME WITH MEALS AS NEEDED  . tiZANidine (ZANAFLEX) 2 MG tablet Take 2 tablets (4 mg total) by mouth at bedtime.  . calcium carbonate (CALCIUM 600) 600 MG TABS tablet Take 1 tablet (600 mg total) by mouth daily. (Patient not taking: Reported on 06/18/2020)   No facility-administered encounter medications on file as of 06/29/2020.    Allergies (verified) Baclofen, Meclizine, Other, Penicillins, Prednisone, Prevacid [lansoprazole], Gabapentin, and Oxycodone   History: Past Medical History:  Diagnosis Date  . Anxiety   . Asthma    reactive airway and shortness of breath w/ exertion in hot temperatures  . Bronchitis   . GERD (gastroesophageal reflux disease)   . Hypertension   . Morbid obesity with BMI of 40.0-44.9, adult (HCC) 04/08/2012  . OA (osteoarthritis)    knee  . Osteoporosis   . Prediabetes   . Sinus infection    10/2018  . Venous (peripheral) insufficiency 08/19/2010  Qualifier: Diagnosis of  By: Laural BenesJohnson MD, [place]     Past Surgical History:  Procedure Laterality Date  . ABDOMINAL HYSTERECTOMY    . CARPAL TUNNEL RELEASE Right   . CHOLECYSTECTOMY    . COLONOSCOPY WITH PROPOFOL N/A 06/19/2017   Procedure: COLONOSCOPY WITH PROPOFOL;  Surgeon: Midge MiniumWohl, Darren, MD;  [facility];  Service: Endoscopy;  Laterality: N/A;  . HEEL SPUR SURGERY    . REPLACEMENT TOTAL KNEE      left  . REPLACEMENT TOTAL KNEE Right   . SHOULDER ARTHROSCOPY WITH OPEN ROTATOR CUFF REPAIR Right 09/15/2015   Procedure: SHOULDER ARTHROSCOPY WITH OPEN ROTATOR CUFF REPAIR;  Surgeon: Deeann SaintHoward Miller, MD;  Location: [facility] ORS;  Service: Orthopedics;  Laterality: Right;  . SHOULDER SURGERY Left    Family History  Adopted: Yes  Problem Relation Age of Onset  . Heart disease Father   . Subarachnoid hemorrhage Mother   . Hypertension Mother   . Subarachnoid hemorrhage Sister   . Hypertension Sister   . Hypertension Sister   . Hypertension Sister   . Healthy Sister   . Breast cancer Neg Hx    Social History   Socioeconomic History  . Marital status: Widowed    Spouse name: Not on file  . Number of children: Not on file  . Years of education: Not on file  . Highest education level: Associate degree: academic program  Occupational History  . Occupation: retired  Tobacco Use  . Smoking status: Never Smoker  . Smokeless tobacco: Never Used  Vaping Use  . Vaping Use: Never used  Substance and Sexual Activity  . Alcohol use: No  . Drug use: No  . Sexual activity: Yes    Birth control/protection: Surgical  Other Topics Concern  . Not on file  Social History Narrative   Retired.  Lives alone   Social Determinants of Health   Financial Resource Strain: Low Risk   . Difficulty of Paying Living Expenses: Not hard at all  Food Insecurity: No Food Insecurity  . Worried About Programme researcher, broadcasting/film/videounning Out of Food in the Last Year: Never true  . Ran Out of Food in the Last Year: Never true  Transportation Needs: No Transportation Needs  . Lack of Transportation (Medical): No  . Lack of Transportation (Non-Medical): No  Physical Activity: Inactive  . Days of Exercise per Week: 0 days  . Minutes of Exercise per Session: 0 min  Stress: No Stress Concern Present  . Feeling of Stress : Not at all  Social Connections:   . Frequency of Communication with Friends and Family: Not on file  . Frequency of  Social Gatherings with Friends and Family: Not on file  . Attends Religious Services: Not on file  . Active Member of Clubs or Organizations: Not on file  . Attends BankerClub or Organization Meetings: Not on file  . Marital Status: Not on file    Tobacco Counseling Counseling given: Not Answered   Clinical Intake:  Pre-visit preparation completed: Yes  Pain : 0-10 Pain Score: 6  Pain Type: Chronic pain Pain Location: Leg Pain Orientation: Right Pain Descriptors / Indicators: Aching Pain Onset: More than a month ago Pain Frequency: Intermittent Pain Relieving Factors: APAP eases it off, also pregabalin  Pain Relieving Factors: APAP eases it off, also pregabalin  Nutritional Status: BMI > 30  Obese Nutritional Risks: None Diabetes: No  How often do you need to have someone help you when you read instructions, pamphlets, or other written materials  from your doctor or pharmacy?: 1 - Never What is the last grade level you completed in school?: 48yrs college  Diabetic? No   Interpreter Needed?: No  Information entered by :: NAllen LPN   Activities of Daily Living In your present state of health, do you have any difficulty performing the following activities: 06/29/2020 05/31/2020  Hearing? N N  Vision? N N  Difficulty concentrating or making decisions? N N  Walking or climbing stairs? Y N  Dressing or bathing? N N  Doing errands, shopping? N N  Preparing Food and eating ? N -  Using the Toilet? N -  In the past six months, have you accidently leaked urine? N -  Comment wears a pad -  Do you have problems with loss of bowel control? N -  Managing your Medications? N -  Managing your Finances? N -  Housekeeping or managing your Housekeeping? N -  Some recent data might be hidden    Patient Care Team: Smitty Cords, DO as PCP - General (Family Medicine) Laurier Nancy, MD as Consulting Physician (Cardiology) [name], MD (Specialist) [place], Jackelyn Poling, [facility] as Pharmacist  Indicate any recent Medical Services you may have received from other than [facility] providers in the past year (date may be approximate).     Assessment:   This is a routine wellness examination for [facility].  Hearing/Vision screen  Hearing Screening   125Hz  250Hz  500Hz  1000Hz  2000Hz  3000Hz  4000Hz  6000Hz  8000Hz   Right ear:           Left ear:           Vision Screening Comments: Regular eye exams, Dr.  Dietary issues and exercise activities discussed: Current Exercise Habits: The patient does not participate in regular exercise at present  Goals    . DIET - INCREASE WATER INTAKE     Recommend drinking at least 6-8 glasses of water a day     . Increase water intake     Recommend drinking at least 3-4 bottles of water a day     . Patient Stated     06/29/2020, eat healthier    . PharmD - Medication Adherence     CARE PLAN ENTRY (see longitudinal plan of care for additional care plan information)   Current Barriers:  . Need for evaluation of medication adherence, as identified by patient's health plan, based on medication adherence quality data  Pharmacist Clinical Goal(s):  Over the next 1 day, patient will work with CM Pharmacist and PCP to address needs related to medication adherence  Interventions: . Inter-disciplinary care team collaboration (see longitudinal plan of care) . Assess adherence to pravastatin (as identified by health plan) o Based on review of chart and confirmed by patient report, patient no longer taking pravastatin due to side effect of dizziness . Discuss with patient importance of medication adherence. Patient confirms using weekly pillbox as adherence aid. Denies any barriers to adherence at this time. . Encourage patient in her plan to call to schedule appointment with PCP. Reports has had numbness in her feet for about a month. Confirms has phone number for clinic  . Denies any current medication questions or  concerns.  Patient Self Care Activities:  . Calls provider office for new concerns or questions  Initial goal documentation       Depression Screen PHQ 2/9 Scores 06/29/2020 05/31/2020 03/18/2020 05/06/2019 03/04/2019 11/05/2018 04/23/2018  PHQ - 2 Score 0 0 0 0 0 0  0  PHQ- 9 Score - - - - - 5 -  Exception Documentation - - - - - (No Data) -    Fall Risk Fall Risk  06/29/2020 05/31/2020 03/18/2020 09/02/2019 08/07/2019  Falls in the past year? 1 0 0 0 0  Comment don't remember what happened - - - -  Number falls in past yr: 0 0 - - -  Injury with Fall? 1 0 - - -  Comment sprained ankle - - - -  Risk for fall due to : Impaired balance/gait;Medication side effect - - - -  Risk for fall due to: Comment - - - - -  Follow up Falls evaluation completed;Education provided;Falls prevention discussed Falls evaluation completed - - -    Any stairs in or around the home? Yes  If so, are there any without handrails? No  Home free of loose throw rugs in walkways, pet beds, electrical cords, etc? Yes  Adequate lighting in your home to reduce risk of falls? Yes   ASSISTIVE DEVICES UTILIZED TO PREVENT FALLS:  Life alert? Yes  Use of a cane, walker or w/c? Yes  Grab bars in the bathroom? Yes  Shower chair or bench in shower? Yes  Elevated toilet seat or a handicapped toilet? Yes   TIMED UP AND GO:  Was the test performed? No .   Cognitive Function:     6CIT Screen 06/29/2020 04/23/2018 04/17/2017  What Year? 0 points 0 points 0 points  What month? 0 points 0 points 0 points  What time? 0 points 0 points 0 points  Count back from 20 0 points 0 points 0 points  Months in reverse 0 points 0 points 0 points  Repeat phrase 0 points 2 points 0 points  Total Score 0 2 0    Immunizations Immunization History  Administered Date(s) Administered  . Fluad Quad(high Dose 65+) 05/28/2019, 05/31/2020  . Influenza Split 06/28/2012, 06/22/2015, 05/23/2016  . Influenza Whole 08/20/2010  . Influenza,  High Dose Seasonal PF 08/06/2013, 06/15/2017, 07/16/2018  . Influenza, Seasonal, Injecte, Preservative Fre 06/11/2014  . Influenza,inj,Quad PF,6+ Mos 12/10/2014  . Influenza-Unspecified 04/20/2013  . Moderna SARS-COVID-2 Vaccination 11/15/2019, 12/13/2019  . PPD Test 05/01/2016  . Pneumococcal Conjugate-13 06/22/2015  . Pneumococcal Polysaccharide-23 06/28/2012  . Tdap 09/19/2007  . Zoster Recombinat (Shingrix) 03/14/2018, 07/31/2018    TDAP status: Due, Education has been provided regarding the importance of this vaccine. Advised may receive this vaccine at local pharmacy or Health Dept. Aware to provide a copy of the vaccination record if obtained from local pharmacy or Health Dept. Verbalized acceptance and understanding. Flu Vaccine status: Up to date Pneumococcal vaccine status: Up to date Covid-19 vaccine status: Completed vaccines  Qualifies for Shingles Vaccine? Yes   Zostavax completed No   Shingrix Completed?: Yes  Screening Tests Health Maintenance  Topic Date Due  . DEXA SCAN  Never done  . TETANUS/TDAP  09/18/2017  . COLONOSCOPY  06/20/2027  . INFLUENZA VACCINE  Completed  . COVID-19 Vaccine  Completed  . Hepatitis C Screening  Completed  . PNA vac Low Risk Adult  Completed    Health Maintenance  Health Maintenance Due  Topic Date Due  . DEXA SCAN  Never done  . TETANUS/TDAP  09/18/2017    Colorectal cancer screening: No longer required.  Mammogram status: No longer required.  Bone Density status: due  Lung Cancer Screening: (Low Dose CT Chest recommended if Age 11-80 years, 30 pack-year currently smoking OR have quit  w/in 15years.) does not qualify.   Lung Cancer Screening Referral: no   Additional Screening:  Hepatitis C Screening: does qualify; Completed 02/16/2016  Vision Screening: Recommended annual ophthalmology exams for early detection of glaucoma and other disorders of the eye. Is the patient up to date with their annual eye exam?  Yes   Who is the provider or what is the name of the office in which the patient attends annual eye exams? Dr. Clydene Pugh If pt is not established with a provider, would they like to be referred to a provider to establish care? No .   Dental Screening: Recommended annual dental exams for proper oral hygiene  Community Resource Referral / Chronic Care Management: CRR required this visit?  No   CCM required this visit?  No      Plan:     I have personally reviewed and noted the following in the patient's chart:   . Medical and social history . Use of alcohol, tobacco or illicit drugs  . Current medications and supplements . Functional ability and status . Nutritional status . Physical activity . Advanced directives . List of other physicians . Hospitalizations, surgeries, and ER visits in previous 12 months . Vitals . Screenings to include cognitive, depression, and falls . Referrals and appointments  In addition, I have reviewed and discussed with patient certain preventive protocols, quality metrics, and best practice recommendations. A written personalized care plan for preventive services as well as general preventive health recommendations were provided to patient.     Barb Merino, LPN   [id number]   Nurse Notes:

## 2020-07-06 ENCOUNTER — Other Ambulatory Visit: Payer: Self-pay | Admitting: Family Medicine

## 2020-07-06 ENCOUNTER — Other Ambulatory Visit: Payer: Self-pay

## 2020-07-06 ENCOUNTER — Ambulatory Visit (INDEPENDENT_AMBULATORY_CARE_PROVIDER_SITE_OTHER): Payer: Medicare HMO | Admitting: Vascular Surgery

## 2020-07-06 ENCOUNTER — Encounter (INDEPENDENT_AMBULATORY_CARE_PROVIDER_SITE_OTHER): Payer: Self-pay | Admitting: Vascular Surgery

## 2020-07-06 VITALS — BP 145/83 | HR 81 | Ht 67.0 in | Wt 284.0 lb

## 2020-07-06 DIAGNOSIS — I890 Lymphedema, not elsewhere classified: Secondary | ICD-10-CM | POA: Diagnosis not present

## 2020-07-06 DIAGNOSIS — R1084 Generalized abdominal pain: Secondary | ICD-10-CM

## 2020-07-06 DIAGNOSIS — K219 Gastro-esophageal reflux disease without esophagitis: Secondary | ICD-10-CM

## 2020-07-06 DIAGNOSIS — I10 Essential (primary) hypertension: Secondary | ICD-10-CM | POA: Diagnosis not present

## 2020-07-06 MED ORDER — SUCRALFATE 1 G PO TABS: ORAL_TABLET | ORAL | 3 refills | Status: DC

## 2020-07-06 MED ORDER — PANTOPRAZOLE SODIUM 20 MG PO TBEC: 20.0000 mg | DELAYED_RELEASE_TABLET | Freq: Every day | ORAL | 1 refills | Status: DC

## 2020-07-06 MED ORDER — LISINOPRIL 20 MG PO TABS: 20.0000 mg | ORAL_TABLET | Freq: Every day | ORAL | 3 refills | Status: DC

## 2020-07-06 NOTE — Progress Notes (Signed)
MRN : [id number]  Megan Perez is a 74 y.o. (December 20, 1945) female who presents with chief complaint of  Chief Complaint  Patient presents with  . Follow-up    3 mo follow up no studies  .  History of Present Illness: Patient returns today in follow up of her leg swelling and lymphedema.  She has been using her pump diligently twice daily with fairly good results although her swelling is still significant.  It is better than it was previously and her legs feel better.  Her left leg remains more swollen than the right leg.  No new wounds or ulcers.  Current Outpatient Medications  Medication Sig Dispense Refill  . acetaminophen (TYLENOL) 500 MG tablet Take 1,300 mg by mouth every 6 (six) hours as needed.     [name] aspirin EC 81 MG tablet Take 81 mg by mouth daily.    [name] azelastine (OPTIVAR) 0.05 % ophthalmic solution INT 1 GTT INTO OU BID FOR 10 DAYS    . calcium carbonate (CALCIUM 600) 600 MG TABS tablet Take 1 tablet (600 mg total) by mouth daily. 90 tablet 3  . diclofenac sodium (VOLTAREN) 1 % GEL APPLY 2 GRAMS TOPICALLY FOUR TIMES DAILY AS NEEDED FOR MODERATE PAIN OF BACK AND KNEES 100 g 1  . lisinopril (ZESTRIL) 20 MG tablet Take 1 tablet (20 mg total) by mouth daily. 90 tablet 3  . pantoprazole (PROTONIX) 20 MG tablet Take 1 tablet (20 mg total) by mouth daily before breakfast. 90 tablet 1  . pregabalin (LYRICA) 50 MG capsule Take 2 capsules (100 mg total) by mouth at bedtime. 60 capsule 1  . sucralfate (CARAFATE) 1 g tablet TAKE 1 TABLET(1 GRAM) BY MOUTH FOUR TIMES DAILY AT BEDTIME WITH MEALS AS NEEDED 60 tablet 3  . tiZANidine (ZANAFLEX) 2 MG tablet Take 2 tablets (4 mg total) by mouth at bedtime. 60 tablet 1   No current facility-administered medications for this visit.    Past Medical History:  Diagnosis Date  . Anxiety   . Asthma    reactive airway and shortness of breath w/ exertion in hot temperatures  . Bronchitis   . GERD (gastroesophageal reflux disease)   . Hypertension    . Morbid obesity with BMI of 40.0-44.9, adult (HCC) 04/08/2012  . OA (osteoarthritis)    knee  . Osteoporosis   . Prediabetes   . Sinus infection    10/2018  . Venous (peripheral) insufficiency 08/19/2010   Qualifier: Diagnosis of  By: [name], [place]      Past Surgical History:  Procedure Laterality Date  . ABDOMINAL HYSTERECTOMY    . CARPAL TUNNEL RELEASE Right   . CHOLECYSTECTOMY    . COLONOSCOPY WITH PROPOFOL N/A 06/19/2017   Procedure: COLONOSCOPY WITH PROPOFOL;  Surgeon: [name], MD;  Location: [facility];  Service: Endoscopy;  Laterality: N/A;  . HEEL SPUR SURGERY    . REPLACEMENT TOTAL KNEE     left  . REPLACEMENT TOTAL KNEE Right   . SHOULDER ARTHROSCOPY WITH OPEN ROTATOR CUFF REPAIR Right 09/15/2015   Procedure: SHOULDER ARTHROSCOPY WITH OPEN ROTATOR CUFF REPAIR;  Surgeon: [name], MD;  Location: [facility] ORS;  Service: Orthopedics;  Laterality: Right;  . SHOULDER SURGERY Left      Social History   Tobacco Use  . Smoking status: Never Smoker  . Smokeless tobacco: Never Used  Vaping Use  . Vaping Use: Never used  Substance Use Topics  . Alcohol use: No  . Drug  use: No      Family History  Adopted: Yes  Problem Relation Age of Onset  . Heart disease Father   . Subarachnoid hemorrhage Mother   . Hypertension Mother   . Subarachnoid hemorrhage Sister   . Hypertension Sister   . Hypertension Sister   . Hypertension Sister   . Healthy Sister   . Breast cancer Neg Hx      Allergies  Allergen Reactions  . Baclofen     Makes her feel Drunk  . Meclizine   . Other     Other reaction(s): Other (See Comments) Patient notes there is a pain medication that causes hot flushing sensation in her mouth but cannot recall its name.  [name] Penicillins   . Prednisone Nausea And Vomiting  . Prevacid [Lansoprazole]     Causes dizziness  . Gabapentin Hives and Itching  . Oxycodone Rash     REVIEW OF SYSTEMS(Negative unless checked)   Constitutional: [] ??Weight loss [] ??Fever [] ??Chills Cardiac: [] ??Chest pain [] ??Chest pressure [] ??Palpitations [] ??Shortness of breath when laying flat [] ??Shortness of breath at rest [] ??Shortness of breath with exertion. Vascular: [x] ??Pain in legs with walking [x] ??Pain in legs at rest [] ??Pain in legs when laying flat [] ??Claudication [] ??Pain in feet when walking [] ??Pain in feet at rest [] ??Pain in feet when laying flat [] ??History of DVT [] ??Phlebitis [x] ??Swelling in legs [] ??Varicose veins [] ??Non-healing ulcers Pulmonary: [] ??Uses home oxygen [] ??Productive cough [] ??Hemoptysis [] ??Wheeze [] ??COPD [] ??Asthma Neurologic: [] ??Dizziness [] ??Blackouts [] ??Seizures [] ??History of stroke [] ??History of TIA [] ??Aphasia [] ??Temporary blindness [] ??Dysphagia [] ??Weakness or numbness in arms [x] ??Weakness or numbness in legs Musculoskeletal: [x] ??Arthritis [] ??Joint swelling [x] ??Joint pain [] ??Low back pain Hematologic: [] ??Easy bruising [] ??Easy bleeding [] ??Hypercoagulable state [] ??Anemic [] ??Hepatitis Gastrointestinal: [] ??Blood in stool [] ??Vomiting blood [] ??Gastroesophageal reflux/heartburn [] ??Abdominal pain Genitourinary: [] ??Chronic kidney disease [] ??Difficult urination [] ??Frequent urination [] ??Burning with urination [] ??Hematuria Skin: [] ??Rashes [] ??Ulcers [] ??Wounds Psychological: [] ??History of anxiety [] ??History of major depression.   Physical Examination  BP (!) 145/83   Pulse 81   Ht 5\' 7"  (1.702 m)   Wt 284 lb (128.8 kg)   BMI 44.48 kg/m  Gen:  WD/WN, NAD.  Appears younger than stated age Head: [place]/AT, No temporalis wasting. Ear/Nose/Throat: Hearing grossly intact, nares w/o erythema or drainage Eyes: Conjunctiva clear. Sclera non-icteric Neck: Supple.  Trachea midline Pulmonary:  Good air movement, no use of accessory muscles.  Cardiac: RRR, no JVD Vascular:  Vessel  Right Left  Radial Palpable Palpable           Musculoskeletal: M/S 5/5 throughout.  No deformity or atrophy.  1-2+ right lower extremity edema, 2+ left lower extremity edema. Neurologic: Sensation grossly intact in extremities.  Symmetrical.  Speech is fluent.  Psychiatric: Judgment intact, Mood & affect appropriate for pt's clinical situation. Dermatologic: No rashes or ulcers noted.  No cellulitis or open wounds.       Labs Recent Results (from the past 2160 hour(s))  TSH     Status: None   Collection Time: 06/03/20  7:58 AM  Result Value Ref Range   TSH 2.20 0.40 - 4.50 mIU/L  Lipid panel     Status: Abnormal   Collection Time: 06/03/20  7:58 AM  Result Value Ref Range   Cholesterol 204 (H) <200 mg/dL   HDL 63 > OR = 50 mg/dL   Triglycerides 76 mg/dL   LDL Cholesterol (Calc) 124 (H) mg/dL (calc)    Comment: Reference range: <100 . Desirable range <100 mg/dL for primary prevention;   <70 mg/dL for patients with CHD or diabetic  patients  with > or = 2 CHD risk factors. [name]. LDL-C is now calculated using the Martin-Hopkins  calculation, which is a validated novel method providing  better accuracy than the Friedewald equation in the  estimation of LDL-C.  Horald PollenMartin SS et al. Lenox AhrJAMA. 1610;960(452013;310(19): 2061-2068  (http://education.[employer].com/faq/FAQ164)    Total CHOL/HDL Ratio 3.2 <5.0 (calc)   Non-HDL Cholesterol (Calc) 141 (H) <130 mg/dL (calc)    Comment: For patients with diabetes plus 1 major ASCVD risk  factor, treating to a non-HDL-C goal of <100 mg/dL  (LDL-C of <40<70 mg/dL) is considered a therapeutic  option.   COMPLETE METABOLIC PANEL WITH GFR     Status: None   Collection Time: 06/03/20  7:58 AM  Result Value Ref Range   Glucose, Bld 91 65 - 99 mg/dL    Comment: .            Fasting reference interval .    BUN 13 7 - 25 mg/dL   Creat 9.810.75 1.910.60 - 4.780.93 mg/dL    Comment: For patients >75 years of age, the reference limit for Creatinine is  approximately 13% higher for people identified as African-American. .    GFR, Est Non African American 78 > OR = 60 mL/min/1.3973m2   GFR, Est African American 90 > OR = 60 mL/min/1.7873m2   BUN/Creatinine Ratio NOT APPLICABLE 6 - 22 (calc)   Sodium 144 135 - 146 mmol/L   Potassium 3.9 3.5 - 5.3 mmol/L   Chloride 107 98 - 110 mmol/L   CO2 27 20 - 32 mmol/L   Calcium 9.0 8.6 - 10.4 mg/dL   Total Protein 6.4 6.1 - 8.1 g/dL   Albumin 3.9 3.6 - 5.1 g/dL   Globulin 2.5 1.9 - 3.7 g/dL (calc)   AG Ratio 1.6 1.0 - 2.5 (calc)   Total Bilirubin 0.3 0.2 - 1.2 mg/dL   Alkaline phosphatase (APISO) 72 37 - 153 U/L   AST 11 10 - 35 U/L   ALT 10 6 - 29 U/L  CBC with Differential/Platelet     Status: Abnormal   Collection Time: 06/03/20  7:58 AM  Result Value Ref Range   WBC 4.9 3.8 - 10.8 Thousand/uL   RBC 3.78 (L) 3.80 - 5.10 Million/uL   Hemoglobin 11.5 (L) 11.7 - 15.5 g/dL   HCT 29.535.3 35 - 45 %   MCV 93.4 80.0 - 100.0 fL   MCH 30.4 27.0 - 33.0 pg   MCHC 32.6 32.0 - 36.0 g/dL   RDW 62.113.7 30.811.0 - 65.715.0 %   Platelets 286 140 - 400 Thousand/uL   MPV 10.2 7.5 - 12.5 fL   Neutro Abs 2,445 1,500 - 7,800 cells/uL   Lymphs Abs 1,803 850 - 3,900 cells/uL   Absolute Monocytes 421 200 - 950 cells/uL   Eosinophils Absolute 172 15.0 - 500.0 cells/uL   Basophils Absolute 59 0.0 - 200.0 cells/uL   Neutrophils Relative % 49.9 %   Total Lymphocyte 36.8 %   Monocytes Relative 8.6 %   Eosinophils Relative 3.5 %   Basophils Relative 1.2 %  Hemoglobin A1c     Status: Abnormal   Collection Time: 06/03/20  7:58 AM  Result Value Ref Range   Hgb A1c MFr Bld 6.0 (H) <5.7 % of total Hgb    Comment: For someone without known diabetes, a hemoglobin  A1c value between 5.7% and 6.4% is consistent with prediabetes and should be confirmed with a  follow-up test. . For someone with known diabetes,  a value <7% indicates that their diabetes is well controlled. A1c targets should be individualized based on duration of  diabetes, age, comorbid conditions, and other considerations. . This assay result is consistent with an increased risk of diabetes. . Currently, no consensus exists regarding use of hemoglobin A1c for diagnosis of diabetes for children. .    Mean Plasma Glucose 126 (calc)   eAG (mmol/L) 7.0 (calc)    Radiology No results found.  Assessment/Plan Essential hypertension blood pressure control important in reducing the progression of atherosclerotic disease. On appropriate oral medications.   Lymphedema of both lower extremities Symptoms are fairly stable.  Overall doing pretty well.  Were going to stretch out her follow-up to 6 months at this point.  Continue to use her lymphedema pump twice daily.  Continue compression and elevation as well as increasing activity.  Contact our office with problems in the interim.    Festus Barren, MD  07/06/2020 9:57 AM    This note was created with Dragon medical transcription system.  Any errors from dictation are purely unintentional

## 2020-07-06 NOTE — Telephone Encounter (Signed)
Medication Refill - Medication: sucralfate (CARAFATE) 1 g tablet  pantoprazole (PROTONIX) 20 MG tablet  lisinopril (ZESTRIL) 20 MG tablet    Has the patient contacted their pharmacy? Yes.   (Agent: If no, request that the patient contact the pharmacy for the refill.) (Agent: If yes, when and what did the pharmacy advise?)  Preferred Pharmacy (with phone number or street name):  [facility] - [place], [place] - [address]  [address] [place] [address]  Phone: [phone] Fax: [phone]     Agent: Please be advised that RX refills may take up to 3 business days. We ask that you follow-up with your pharmacy.

## 2020-07-13 ENCOUNTER — Telehealth: Payer: Self-pay | Admitting: Family Medicine

## 2020-07-13 DIAGNOSIS — H04213 Epiphora due to excess lacrimation, bilateral lacrimal glands: Secondary | ICD-10-CM | POA: Diagnosis not present

## 2020-07-13 DIAGNOSIS — H02885 Meibomian gland dysfunction left lower eyelid: Secondary | ICD-10-CM | POA: Diagnosis not present

## 2020-07-13 DIAGNOSIS — H02882 Meibomian gland dysfunction right lower eyelid: Secondary | ICD-10-CM | POA: Diagnosis not present

## 2020-07-13 DIAGNOSIS — H16229 Keratoconjunctivitis sicca, not specified as Sjogren's, unspecified eye: Secondary | ICD-10-CM | POA: Diagnosis not present

## 2020-07-13 DIAGNOSIS — J4520 Mild intermittent asthma, uncomplicated: Secondary | ICD-10-CM

## 2020-07-13 MED ORDER — ALBUTEROL SULFATE HFA 108 (90 BASE) MCG/ACT IN AERS: 2.0000 | INHALATION_SPRAY | RESPIRATORY_TRACT | 2 refills | Status: DC | PRN

## 2020-07-13 NOTE — Telephone Encounter (Signed)
Pt request refill   albuterol (PROVENTIL HFA;VENTOLIN HFA) 108 (90 Base) MCG/ACT inhaler   Pt states it has not been long since she was in office, but went to use this the other day and it is empty.  She say she needs.  [id number] [place], [place] - [address] AT [facility] OF [address] [facility] Phone:  [phone]  Fax:  [phone]

## 2020-07-13 NOTE — Telephone Encounter (Signed)
Patient is requesting medication no longer listed on current medication list- she does have diagnosis of mild intermittent asthma in the past. Requesting new Rx for her inhaler

## 2020-08-17 ENCOUNTER — Encounter: Payer: Self-pay | Admitting: Pain Medicine

## 2020-08-18 ENCOUNTER — Ambulatory Visit: Payer: Medicare HMO | Attending: Pain Medicine | Admitting: Pain Medicine

## 2020-08-18 ENCOUNTER — Other Ambulatory Visit: Payer: Self-pay

## 2020-08-18 DIAGNOSIS — M7918 Myalgia, other site: Secondary | ICD-10-CM | POA: Diagnosis not present

## 2020-08-18 DIAGNOSIS — M545 Low back pain, unspecified: Secondary | ICD-10-CM | POA: Diagnosis not present

## 2020-08-18 DIAGNOSIS — G8929 Other chronic pain: Secondary | ICD-10-CM

## 2020-08-18 DIAGNOSIS — M62838 Other muscle spasm: Secondary | ICD-10-CM

## 2020-08-18 DIAGNOSIS — Z79899 Other long term (current) drug therapy: Secondary | ICD-10-CM

## 2020-08-18 DIAGNOSIS — M25561 Pain in right knee: Secondary | ICD-10-CM

## 2020-08-18 DIAGNOSIS — M25562 Pain in left knee: Secondary | ICD-10-CM

## 2020-08-18 DIAGNOSIS — M792 Neuralgia and neuritis, unspecified: Secondary | ICD-10-CM

## 2020-08-18 DIAGNOSIS — E559 Vitamin D deficiency, unspecified: Secondary | ICD-10-CM

## 2020-08-18 DIAGNOSIS — M79604 Pain in right leg: Secondary | ICD-10-CM

## 2020-08-18 DIAGNOSIS — G894 Chronic pain syndrome: Secondary | ICD-10-CM | POA: Diagnosis not present

## 2020-08-18 MED ORDER — CALCIUM CARBONATE 600 MG PO TABS: 600.0000 mg | ORAL_TABLET | Freq: Every day | ORAL | 2 refills | Status: DC

## 2020-08-18 MED ORDER — TIZANIDINE HCL 4 MG PO TABS: 4.0000 mg | ORAL_TABLET | Freq: Every day | ORAL | 2 refills | Status: DC

## 2020-08-18 MED ORDER — PREGABALIN 100 MG PO CAPS: 100.0000 mg | ORAL_CAPSULE | Freq: Every day | ORAL | 2 refills | Status: DC

## 2020-08-18 NOTE — Progress Notes (Signed)
Patient: Megan Perez  Service Category: E/M  Provider: Gaspar Cola, MD  DOB: [birth year]  DOS: 08/18/2020  Location: Office  MRN: [id number]  Setting: Ambulatory outpatient  Referring Provider: Nobie Putnam *  Type: Established Patient  Specialty: Interventional Pain Management  PCP: Olin Hauser, DO  Location: Remote location  Delivery: TeleHealth     Virtual Encounter - Pain Management PROVIDER NOTE: Information contained herein reflects review and annotations entered in association with encounter. Interpretation of such information and data should be left to medically-trained personnel. Information provided to patient can be located elsewhere in the medical record under "Patient Instructions". Document created using STT-dictation technology, any transcriptional errors that may result from process are unintentional.    Contact & Pharmacy Preferred: [phone] Home: [phone] (home) Mobile: [phone] (mobile) E-mail: [email or web address]  [facility] [place], [address] [place] [address] Phone: [phone] Fax: [phone]  [facility] [address], [place] [address] Phone: [phone] Fax: [address], [address] [place] [address] Phone: [phone] Fax: [phone]   Pre-screening  Ms. Cordaro offered "in-person" vs "virtual" encounter. She indicated preferring virtual for this encounter.   Reason COVID-19*  Social distancing based on CDC and AMA recommendations.   I contacted Megan Perez on 08/18/2020 via telephone.      I clearly identified myself as Gaspar Cola, MD. I verified that I was speaking with the correct person using two identifiers (Name: SUHANI STILLION, and date of birth:  11/03/1945).  Consent I sought verbal advanced consent from Megan Perez for virtual visit interactions. I informed Ms. Woodfield of possible security and privacy concerns, risks, and limitations associated with providing "not-in-person" medical evaluation and management services. I also informed Ms. Oswald of the availability of "in-person" appointments. Finally, I informed her that there would be a charge for the virtual visit and that she could be  personally, fully or partially, financially responsible for it. Ms. Kloehn expressed understanding and agreed to proceed.   Historic Elements   Ms. ADDYSIN PORCO is a 74 y.o. year old, female patient evaluated today after our last contact on 06/21/2020. Ms. Kirchoff  has a past medical history of Anxiety, Asthma, Bronchitis, GERD (gastroesophageal reflux disease), Hypertension, Morbid obesity with BMI of 40.0-44.9, adult ([place]) (04/08/2012), OA (osteoarthritis), Osteoporosis, Prediabetes, Sinus infection, and Venous (peripheral) insufficiency (08/19/2010). She also  has a past surgical history that includes Abdominal hysterectomy; Cholecystectomy; Replacement total knee; Heel spur surgery; Shoulder surgery (Left); Shoulder arthroscopy with open rotator cuff repair (Right, 09/15/2015); Replacement total knee (Right); Carpal tunnel release (Right); and Colonoscopy with propofol (N/A, 06/19/2017). Ms. Supak has a current medication list which includes the following prescription(s): acetaminophen, albuterol, aspirin ec, azelastine, calcium carbonate, diclofenac sodium, lisinopril, pantoprazole, pregabalin, sucralfate, and tizanidine. She  reports that she has never smoked. She has never used smokeless tobacco. She reports that she does not drink alcohol and does not use drugs. Ms. Perko is allergic to baclofen, meclizine, other, penicillins, prednisone, prevacid [lansoprazole], gabapentin, and oxycodone.   HPI  Today, she is being contacted for medication management.    RTCB: PRN Nonopioids transferred 08/18/2020: Calcium, Lyrica, and Zanaflex.  Pharmacotherapy Assessment  Analgesic: No opioid analgesics from our practice.   Monitoring: [place] PMP:  PDMP reviewed during this encounter.       Pharmacotherapy: No side-effects or adverse reactions reported. Compliance: No problems identified. Effectiveness: Clinically acceptable. Plan: Refer to "POC".  UDS:  Summary  Date Value Ref Range Status  11/05/2018 FINAL  Final    Comment:    ==================================================================== [facility] COMP DRUG ANALYSIS,UR ==================================================================== Test                             Result       Flag       Units Drug Present and Declared for Prescription Verification   Acetaminophen                  PRESENT      EXPECTED Drug Absent but Declared for Prescription Verification   Baclofen                       Not Detected UNEXPECTED   Diclofenac                     Not Detected UNEXPECTED    Topical diclofenac, as indicated in the declared medication list,    is not always detected even when used as directed.   Salicylate                     Not Detected UNEXPECTED    Aspirin, as indicated in the declared medication list, is not    always detected even when used as directed. ==================================================================== Test                      Result    Flag   Units      Ref Range   Creatinine              93               mg/dL      >=20 ==================================================================== Declared Medications:  The flagging and interpretation on this report are based on the  following declared medications.  Unexpected results may arise from  inaccuracies in the declared medications.  **Note: The testing scope of this panel includes these medications:  Baclofen  **Note: The testing scope of this panel does not include small to  moderate amounts of these  reported medications:  Acetaminophen  Aspirin (Aspirin 81)  Topical Diclofenac  **Note: The testing scope of this panel does not include following  reported medications:  Azelastine  Furosemide  Lisinopril  Pantoprazole  Potassium  Pravastatin  Sucralfate ==================================================================== For clinical consultation, please call [phone]. ====================================================================     Laboratory Chemistry Profile   Renal Lab Results  Component Value Date   BUN 13 06/03/2020   CREATININE 0.75 42/70/6237   BCR NOT APPLICABLE 62/83/1517   GFRAA 90 06/03/2020   GFRNONAA 78 06/03/2020     Hepatic Lab Results  Component Value Date   AST 11 06/03/2020   ALT 10 06/03/2020   ALBUMIN 3.9 01/16/2017   ALKPHOS 73 01/16/2017   HCVAB NEGATIVE 02/16/2016     Electrolytes Lab Results  Component Value Date   NA 144 06/03/2020   K 3.9 06/03/2020   CL 107 06/03/2020   CALCIUM 9.0 06/03/2020   MG 2.1 11/05/2018     Bone Lab Results  Component Value Date   25OHVITD1 13 (L) 11/05/2018   25OHVITD2 <1.0 11/05/2018   25OHVITD3 13 11/05/2018  Inflammation (CRP: Acute Phase) (ESR: Chronic Phase) Lab Results  Component Value Date   CRP 11 (H) 11/05/2018   ESRSEDRATE 30 11/05/2018       Note: Above Lab results reviewed.  Imaging  VAS [place] LOWER EXTREMITY VENOUS REFLUX  Lower Venous Reflux Study  Performing Technologist: Almira Coaster RVS    Examination Guidelines: A complete evaluation includes B-mode imaging, spectral Doppler, color Doppler, and power Doppler as needed of all accessible portions of each vessel. Bilateral testing is considered an integral part of a complete examination. Limited examinations for reoccurring indications may be performed as noted. The reflux portion of the exam is performed with the patient in reverse Trendelenburg. Significant venous reflux is defined as >500 ms in the  superficial venous system, and >1 second in the deep venous system.    Venous Reflux Times +--------------+---------+------+-----------+------------+--------+ RIGHT         Reflux NoRefluxReflux TimeDiameter cmsComments                         Yes                                  +--------------+---------+------+-----------+------------+--------+ CFV           no                                             +--------------+---------+------+-----------+------------+--------+ FV prox       no                                             +--------------+---------+------+-----------+------------+--------+ FV mid        no                                             +--------------+---------+------+-----------+------------+--------+ FV dist       no                                             +--------------+---------+------+-----------+------------+--------+ Popliteal     no                                             +--------------+---------+------+-----------+------------+--------+ GSV at SFJ    no                            .67              +--------------+---------+------+-----------+------------+--------+ GSV prox thighno                            .70              +--------------+---------+------+-----------+------------+--------+ GSV mid thigh no                            .  41              +--------------+---------+------+-----------+------------+--------+ GSV dist thighno                            .54              +--------------+---------+------+-----------+------------+--------+ GSV at knee   no                            .48              +--------------+---------+------+-----------+------------+--------+ GSV prox calf no                            .43              +--------------+---------+------+-----------+------------+--------+ SSV Pop Fossa no                            .39               +--------------+---------+------+-----------+------------+--------+    +--------------+---------+------+-----------+------------+--------+ LEFT          Reflux NoRefluxReflux TimeDiameter cmsComments                         Yes                                  +--------------+---------+------+-----------+------------+--------+ CFV           no                                             +--------------+---------+------+-----------+------------+--------+ FV prox       no                                             +--------------+---------+------+-----------+------------+--------+ FV mid        no                                             +--------------+---------+------+-----------+------------+--------+ FV dist       no                                             +--------------+---------+------+-----------+------------+--------+ Popliteal     no                                             +--------------+---------+------+-----------+------------+--------+ GSV at SFJ    no                            1.11             +--------------+---------+------+-----------+------------+--------+ GSV prox thighno                            .  71              +--------------+---------+------+-----------+------------+--------+ GSV mid thigh no                            .53              +--------------+---------+------+-----------+------------+--------+ GSV dist thighno                            .44              +--------------+---------+------+-----------+------------+--------+ GSV at knee   no                            .40              +--------------+---------+------+-----------+------------+--------+ GSV prox calf no                            .39              +--------------+---------+------+-----------+------------+--------+ SSV Pop Fossa no                            .71               +--------------+---------+------+-----------+------------+--------+        Summary: Bilateral: - No evidence of deep vein thrombosis seen in the lower extremities, bilaterally, from the common femoral through the popliteal veins.    - No evidence of superficial venous thrombosis in the lower extremities, bilaterally.   - No evidence of deep venous insufficiency seen bilaterally in the lower extremity.   - No evidence of superficial venous reflux seen in the greater saphenous veins bilaterally.   - No evidence of superficial venous reflux seen in the short saphenous veins bilaterally.   *See table(s) above for measurements and observations.  Electronically signed by Leotis Pain MD on 04/13/2020 at 10:45:22 AM.      Final    Assessment  The primary encounter diagnosis was Chronic pain syndrome. Diagnoses of Chronic low back pain (Primary Area of Pain) (Bilateral) (R>L) w/o sciatica, Chronic peripheral neuropathic pain, Chronic lower extremity pain (Secondary Area of Pain) (Right), Chronic knee pain (Tertiary Area of Pain) (Bilateral) (R>L), Pharmacologic therapy, Muscle spasms of both lower extremities, Chronic musculoskeletal pain, Neurogenic pain, and Vitamin D deficiency were also pertinent to this visit.  Plan of Care  Problem-specific:  No problem-specific Assessment & Plan notes found for this encounter.  Ms. ELLIANNA RUEST has a current medication list which includes the following long-term medication(s): albuterol, calcium carbonate, lisinopril, pantoprazole, pregabalin, sucralfate, and tizanidine.  Pharmacotherapy (Medications Ordered): Meds ordered this encounter  Medications  . tiZANidine (ZANAFLEX) 4 MG tablet    Sig: Take 1 tablet (4 mg total) by mouth at bedtime.    Dispense:  30 tablet    Refill:  2    Fill one day early if pharmacy is closed on scheduled refill date. Generic permitted. Do not send renewal requests. Void any older duplicate prescription or  refill(s) that may be on file.  . pregabalin (LYRICA) 100 MG capsule    Sig: Take 1 capsule (100 mg total) by mouth at bedtime.    Dispense:  30 capsule    Refill:  2    Fill one day early  if pharmacy is closed on scheduled refill date. Generic permitted. Do not send renewal requests. Void any older duplicate prescription or refill(s) that may be on file.  . calcium carbonate (CALCIUM 600) 600 MG TABS tablet    Sig: Take 1 tablet (600 mg total) by mouth daily.    Dispense:  30 tablet    Refill:  2    Fill one day early if pharmacy is closed on scheduled refill date. Generic permitted. Do not send renewal requests. Void any older duplicate prescription or refill(s) that may be on file.   Orders:  No orders of the defined types were placed in this encounter.  Follow-up plan:   Return if symptoms worsen or fail to improve.      Interventional treatment options:  Under consideration:   NOTE: NO further RFA attempts until she brings her BMI<35. (Technically difficult procedure due to intra-procedural non-compliance and very unreliable feedback.) Therapeutic left lumbar facet RFA #1 (today) Diagnostic bilateral SI joint blocks  Possible bilateral SI joint RFA  Diagnostic right L4 TFESI  Diagnostic right L3-4 LESI  Diagnostic bilateral genicular NB  Possible bilateral genicular nerve RFA    Therapeutic/palliative (PRN):   Diagnostic bilateral lumbar facet block #3  Palliative right lumbar facet RFA #2 (last done 08/07/2019) (Do not repeat until BMI<35) Palliative left lumbar facet RFA #2 (last done 09/02/2019) (Do not repeat until BMI<35)          Recent Visits Date Type Provider Dept  06/21/20 Office Visit Milinda Pointer, MD [facility]  Showing recent visits within past 90 days and meeting all other requirements Today's Visits Date Type Provider Dept  08/18/20 Telemedicine Milinda Pointer, MD [facility]  Showing today's visits and meeting all other  requirements Future Appointments No visits were found meeting these conditions. Showing future appointments within next 90 days and meeting all other requirements  I discussed the assessment and treatment plan with the patient. The patient was provided an opportunity to ask questions and all were answered. The patient agreed with the plan and demonstrated an understanding of the instructions.  Patient advised to call back or seek an in-person evaluation if the symptoms or condition worsens.  Duration of encounter: 15 minutes.  Note by: Gaspar Cola, MD Date: 08/18/2020; Time: 3:32 PM

## 2020-08-30 ENCOUNTER — Telehealth: Payer: Self-pay | Admitting: Pain Medicine

## 2020-08-30 NOTE — Telephone Encounter (Signed)
Patient called stating she is in pain and has been since RF. Please call

## 2020-08-30 NOTE — Telephone Encounter (Signed)
Orders from last visit state to "return if symptoms worsen or fail to improve.' Please call patient to schedule appt Thanks

## 2020-09-01 ENCOUNTER — Other Ambulatory Visit: Payer: Self-pay

## 2020-09-01 ENCOUNTER — Ambulatory Visit: Payer: Medicare HMO | Attending: Pain Medicine | Admitting: Pain Medicine

## 2020-09-01 DIAGNOSIS — M545 Low back pain, unspecified: Secondary | ICD-10-CM | POA: Diagnosis not present

## 2020-09-01 DIAGNOSIS — G894 Chronic pain syndrome: Secondary | ICD-10-CM | POA: Diagnosis not present

## 2020-09-01 DIAGNOSIS — M47816 Spondylosis without myelopathy or radiculopathy, lumbar region: Secondary | ICD-10-CM | POA: Diagnosis not present

## 2020-09-01 DIAGNOSIS — G8929 Other chronic pain: Secondary | ICD-10-CM | POA: Diagnosis not present

## 2020-09-01 DIAGNOSIS — M533 Sacrococcygeal disorders, not elsewhere classified: Secondary | ICD-10-CM | POA: Diagnosis not present

## 2020-09-01 DIAGNOSIS — M4310 Spondylolisthesis, site unspecified: Secondary | ICD-10-CM

## 2020-09-01 NOTE — Patient Instructions (Addendum)
____________________________________________________________________________________________  Preparing for Procedure with Sedation  Procedure appointments are limited to planned procedures: . No Prescription Refills. . No disability issues will be discussed. . No medication changes will be discussed.  Instructions: . Oral Intake: Do not eat or drink anything for at least 8 hours prior to your procedure. (Exception: Blood Pressure Medication. See below.) . Transportation: Unless otherwise stated by your physician, you may drive yourself after the procedure. . Blood Pressure Medicine: Do not forget to take your blood pressure medicine with a sip of water the morning of the procedure. If your Diastolic (lower reading)is above 100 mmHg, elective cases will be cancelled/rescheduled. . Blood thinners: These will need to be stopped for procedures. Notify our staff if you are taking any blood thinners. Depending on which one you take, there will be specific instructions on how and when to stop it. . Diabetics on insulin: Notify the staff so that you can be scheduled 1st case in the morning. If your diabetes requires high dose insulin, take only  of your normal insulin dose the morning of the procedure and notify the staff that you have done so. . Preventing infections: Shower with an antibacterial soap the morning of your procedure. . Build-up your immune system: Take 1000 mg of Vitamin C with every meal (3 times a day) the day prior to your procedure. . Antibiotics: Inform the staff if you have a condition or reason that requires you to take antibiotics before dental procedures. . Pregnancy: If you are pregnant, call and cancel the procedure. . Sickness: If you have a cold, fever, or any active infections, call and cancel the procedure. . Arrival: You must be in the facility at least 30 minutes prior to your scheduled procedure. . Children: Do not bring children with you. . Dress appropriately:  Bring dark clothing that you would not mind if they get stained. . Valuables: Do not bring any jewelry or valuables.  Reasons to call and reschedule or cancel your procedure: (Following these recommendations will minimize the risk of a serious complication.) . Surgeries: Avoid having procedures within 2 weeks of any surgery. (Avoid for 2 weeks before or after any surgery). . Flu Shots: Avoid having procedures within 2 weeks of a flu shots or . (Avoid for 2 weeks before or after immunizations). . Barium: Avoid having a procedure within 7-10 days after having had a radiological study involving the use of radiological contrast. (Myelograms, Barium swallow or enema study). . Heart attacks: Avoid any elective procedures or surgeries for the initial 6 months after a "Myocardial Infarction" (Heart Attack). . Blood thinners: It is imperative that you stop these medications before procedures. Let us know if you if you take any blood thinner.  . Infection: Avoid procedures during or within two weeks of an infection (including chest colds or gastrointestinal problems). Symptoms associated with infections include: Localized redness, fever, chills, night sweats or profuse sweating, burning sensation when voiding, cough, congestion, stuffiness, runny nose, sore throat, diarrhea, nausea, vomiting, cold or Flu symptoms, recent or current infections. It is specially important if the infection is over the area that we intend to treat. . Heart and lung problems: Symptoms that may suggest an active cardiopulmonary problem include: cough, chest pain, breathing difficulties or shortness of breath, dizziness, ankle swelling, uncontrolled high or unusually low blood pressure, and/or palpitations. If you are experiencing any of these symptoms, cancel your procedure and contact your primary care physician for an evaluation.  Remember:  Regular Business hours are:    Monday to Thursday 8:00 AM to 4:00 PM  Provider's  Schedule: Milinda Pointer, MD:  Procedure days: Tuesday and Thursday 7:30 AM to 4:00 PM  Gillis Santa, MD:  Procedure days: Monday and Wednesday 7:30 AM to 4:00 PM ____________________________________________________________________________________________    ______________________________________________________________________________________________  Body mass index (BMI)  Body mass index (BMI) is a common tool for deciding whether a person has an appropriate body weight.  It measures a persons weight in relation to their height.   According to the [employer] of health (NIH): [name] A BMI of less than 18.5 means that a person is underweight. . A BMI of between 18.5 and 24.9 is ideal. . A BMI of between 25 and 29.9 is overweight. . A BMI over 30 indicates obesity.  Weight Management Required  URGENT: Your weight has been found to be adversely affecting your health.  Dear Ms. Blitch:  Your current Estimated body mass index is 44.48 kg/m as calculated from the following:   Height as of 07/06/20: $RemoveBefo'5\' 7"'[id number]$  (1.702 m).   Weight as of 07/06/20: 284 lb (128.8 kg).  Please use the table below to identify your weight category and associated incidence of chronic pain, secondary to your weight.  Body Mass Index (BMI) Classification BMI level (kg/m2) Category Associated incidence of chronic pain  <18  Underweight   18.5-24.9 Ideal body weight   25-29.9 Overweight  20%  30-34.9 Obese (Class I)  68%  35-39.9 Severe obesity (Class II)  136%  >40 Extreme obesity (Class III)  254%   In addition: You will be considered "Morbidly Obese", if your BMI is above 30 and you have one or more of the following conditions which are known to be caused and/or directly associated with obesity: 1.    Type 2 Diabetes (Which in turn can lead to cardiovascular diseases (CVD), stroke, peripheral vascular diseases (PVD), retinopathy, nephropathy, and neuropathy) 2.    Cardiovascular Disease (High Blood  Pressure; Congestive Heart Failure; High Cholesterol; Coronary Artery Disease; Angina; or History of Heart Attacks) 3.    Breathing problems (Asthma; obesity-hypoventilation syndrome; obstructive sleep apnea; chronic inflammatory airway disease; reactive airway disease; or shortness of breath) 4.    Chronic kidney disease 5.    Liver disease (nonalcoholic fatty liver disease) 6.    High blood pressure 7.    Acid reflux (gastroesophageal reflux disease; heartburn) 8.    Osteoarthritis (OA) (with any of the following: hip pain; knee pain; and/or low back pain) 9.    Low back pain (Lumbar Facet Syndrome; and/or Degenerative Disc Disease) 10.  Hip pain (Osteoarthritis of hip) (For every 1 lbs of added body weight, there is a 2 lbs increase in pressure inside of each hip articulation. 1:2 mechanical relationship) 11.  Knee pain (Osteoarthritis of knee) (For every 1 lbs of added body weight, there is a 4 lbs increase in pressure inside of each knee articulation. 1:4 mechanical relationship) (patients with a BMI>30 kg/m2 were 6.8 times more likely to develop knee OA than normal-weight individuals) 12.  Cancer: Epidemiological studies have shown that obesity is a risk factor for: post-menopausal breast cancer; cancers of the endometrium, colon and kidney cancer; malignant adenomas of the oesophagus. Obese subjects have an approximately 1.5-3.5-fold increased risk of developing these cancers compared with normal-weight subjects, and it has been estimated that between 15 and 45% of these cancers can be attributed to overweight. More recent studies suggest that obesity may also increase the risk of other types of cancer, including pancreatic, hepatic  and gallbladder cancer. (Ref: Obesity and cancer. Pischon T, Nthlings U, [employer] H. Proc Nutr Soc. 2008 May;67(2):128-45. doi: 76.8115/[id number].) The International Agency for Research on Cancer (IARC) has identified 13 cancers associated with overweight and  obesity: meningioma, multiple myeloma, adenocarcinoma of the esophagus, and cancers of the thyroid, postmenopausal breast cancer, gallbladder, stomach, liver, pancreas, kidney, ovaries, uterus, colon and rectal (colorectal) cancers. 76 percent of all cancers diagnosed in women and 24 percent of those diagnosed in men are associated with overweight and obesity.  Recommendation: At this point it is urgent that you take a step back and concentrate in loosing weight. Dedicate 100% of your efforts on this task. Nothing else will improve your health more than bringing your weight down and your BMI to less than 30. If you are here, you probably have chronic pain. We know that most chronic pain patients have difficulty exercising secondary to their pain. For this reason, you must rely on proper nutrition and diet in order to lose the weight. If your BMI is above 40, you should seriously consider bariatric surgery. A realistic goal is to lose 10% of your body weight over a period of 12 months.  Be honest to yourself, if over time you have unsuccessfully tried to lose weight, then it is time for you to seek professional help and to enter a medically supervised weight management program, and/or undergo bariatric surgery. Stop procrastinating.   Pain management considerations:  1.    Pharmacological Problems: Be advised that the use of opioid analgesics (oxycodone; hydrocodone; morphine; methadone; codeine; and all of their derivatives) have been associated with decreased metabolism and weight gain.  For this reason, should we see that you are unable to lose weight while taking these medications, it may become necessary for [place] to taper down and indefinitely discontinue them.  2.    Technical Problems: The incidence of successful interventional therapies decreases as the patient's BMI increases. It is much more difficult to accomplish a safe and effective interventional therapy on a patient with a BMI above 35. 3.     Radiation Exposure Problems: The x-rays machine, used to accomplish injection therapies, will automatically increase their x-ray output in order to capture an appropriate bone image. This means that radiation exposure increases exponentially with the patient's BMI. (The higher the BMI, the higher the radiation exposure.) Although the level of radiation used at a given time is still safe to the patient, it is not for the physician and/or assisting staff. Unfortunately, radiation exposure is accumulative. Because physicians and the staff have to do procedures and be exposed on a daily basis, this can result in health problems such as cancer and radiation burns. Radiation exposure to the staff is monitored by the radiation batches that they wear. The exposure levels are reported back to the staff on a quarterly basis. Depending on levels of exposure, physicians and staff may be obligated by law to decrease this exposure. This means that they have the right and obligation to refuse providing therapies where they may be overexposed to radiation. For this reason, physicians may decline to offer therapies such as radiofrequency ablation or implants to patients with a BMI above 40. 4.    Current Trends: Be advised that the current trend is to no longer offer certain therapies to patients with a BMI equal to, or above 35, due to increase perioperative risks, increased technical procedural difficulties, and excessive radiation exposure to healthcare personnel.  ______________________________________________________________________________________________

## 2020-09-01 NOTE — Progress Notes (Signed)
Patient: Megan Perez  Service Category: E/M  Provider: Gaspar Cola, MD  DOB: 10-22-1945  DOS: 09/01/2020  Location: Office  MRN: [id number]  Setting: Ambulatory outpatient  Referring Provider: Nobie Perez *  Type: Established Patient  Specialty: Interventional Pain Management  PCP: Megan Hauser, DO  Location: Remote location  Delivery: TeleHealth     Virtual Encounter - Pain Management PROVIDER NOTE: Information contained herein reflects review and annotations entered in association with encounter. Interpretation of such information and data should be left to medically-trained personnel. Information provided to patient can be located elsewhere in the medical record under "Patient Instructions". Document created using STT-dictation technology, any transcriptional errors that may result from process are unintentional.    Contact & Pharmacy Preferred: [phone] Home: [phone] (home) Mobile: [phone] (mobile) E-mail: [email or web address] .[facility] [place], [address] [place] [address] Phone: [phone] Fax: [phone]  [facility] [place] [place] [address] Phone: [phone] Fax: [address] [place], [place] [address] Phone: [phone] Fax: [phone]   Pre-screening  Megan Perez offered "in-person" vs "virtual" encounter. She indicated preferring virtual for this encounter.   Reason COVID-19*  Social distancing based on CDC and AMA recommendations.   I contacted Megan Perez on 09/01/2020 via telephone.      I clearly identified myself as Gaspar Cola, MD. I verified that I was speaking with the correct person using two identifiers (Name: [name], and date of birth:  [birth year]).  Consent I sought verbal advanced consent from Megan Perez for virtual visit interactions. I informed Megan Perez of possible security and privacy concerns, risks, and limitations associated with providing "not-in-person" medical evaluation and management services. I also informed Megan Perez of the availability of "in-person" appointments. Finally, I informed her that there would be a charge for the virtual visit and that she could be  personally, fully or partially, financially responsible for it. Megan Perez expressed understanding and agreed to proceed.   Historic Elements   Megan Perez is a 74 y.o. year old, female patient evaluated today after our last contact on 08/30/2020. Megan Perez  has a past medical history of Anxiety, Asthma, Bronchitis, GERD (gastroesophageal reflux disease), Hypertension, Morbid obesity with BMI of 40.0-44.9, adult ([facility]) (04/08/2012), OA (osteoarthritis), Osteoporosis, Prediabetes, Sinus infection, and Venous (peripheral) insufficiency (08/19/2010). She also  has a past surgical history that includes Abdominal hysterectomy; Cholecystectomy; Replacement total knee; Heel spur surgery; Shoulder surgery (Left); Shoulder arthroscopy with open rotator cuff repair (Right, 09/15/2015); Replacement total knee (Right); Carpal tunnel release (Right); and Colonoscopy with propofol (N/A, 06/19/2017). Megan Perez has a current medication list which includes the following prescription(s): acetaminophen, albuterol, aspirin ec, azelastine, calcium carbonate, diclofenac sodium, lisinopril, pantoprazole, pregabalin, restasis, sucralfate, and tizanidine. She  reports that she has never smoked. She has never used smokeless tobacco. She reports that she does not drink alcohol and does not use drugs. Megan Perez is allergic to baclofen, meclizine, other, penicillins, prednisone, prevacid [lansoprazole], gabapentin, and oxycodone.   HPI  Today, she is being contacted for worsening of  previously known (established) problem.  The patient indicates having a flareup of her low back pain primarily in the right side of the lower back with  pain being radiated towards her right lateral thigh.  The patient has a known history of lumbar facet syndrome and today I asked her to do some provocative maneuvers at home to determine where the pain was coming from.  The patient indicated having exact reproduction of the pain on hyperextension on rotation towards the right side, again confirming that we are still dealing with a lumbar facet syndrome.  The patient was treated in the past with radiofrequency ablation of the lumbar facets, which she had done around December 2020.  He has been a year since that radiofrequency and according to follow-up after the radiofrequency the patient had attained 100% relief of the pain that seem to be ongoing at the time of that evaluation.  This seems to have worn off now and the pain is returning.  Unfortunately, the patient has confirmed for me that she has not lost any weight and she is still keeping the scale at about 284 pounds.  She is only 5 feet 7 inches tall with a BMI of 44.48 kilograms per square meter.  Today I took the time to explain to the patient what would happen if she does not get to work on bringing her weight down.  I described to her the normal progression of this Lumbar facet disease and how it can continue to cause further hypertrophy of the facet joints which in turn will begin to invade the neuroforamina in the central canal resulting in the need to decompress the spine and stabilize it.  I offered to provide her with referrals to medical weight management, but she declined indicating that she can work on that.  This right there tells me that it is very unlikely that she will be able to lose that weight.  RTCB: PRN Nonopioids transferred 08/18/2020: Calcium, Lyrica, and Zanaflex.  Pharmacotherapy Assessment  Analgesic: No opioid analgesics from  our practice.   Monitoring: [place] PMP: PDMP reviewed during this encounter.       Pharmacotherapy: No side-effects or adverse reactions reported. Compliance: No problems identified. Effectiveness: Clinically acceptable. Plan: Refer to "POC".  UDS:  Summary  Date Value Ref Range Status  11/05/2018 FINAL  Final    Comment:    ==================================================================== [facility] COMP DRUG ANALYSIS,UR ==================================================================== Test                             Result       Flag       Units Drug Present and Declared for Prescription Verification   Acetaminophen                  PRESENT      EXPECTED Drug Absent but Declared for Prescription Verification   Baclofen                       Not Detected UNEXPECTED   Diclofenac                     Not Detected UNEXPECTED    Topical diclofenac, as indicated in the declared medication list,    is not always detected even when used as directed.   Salicylate                     Not Detected UNEXPECTED    Aspirin, as indicated in the declared medication list, is not    always detected even when used as directed. ==================================================================== Test  Result    Flag   Units      Ref Range   Creatinine              93               mg/dL      >=20 ==================================================================== Declared Medications:  The flagging and interpretation on this report are based on the  following declared medications.  Unexpected results may arise from  inaccuracies in the declared medications.  **Note: The testing scope of this panel includes these medications:  Baclofen  **Note: The testing scope of this panel does not include small to  moderate amounts of these reported medications:  Acetaminophen  Aspirin (Aspirin 81)  Topical Diclofenac  **Note: The testing scope of this panel does not include  following  reported medications:  Azelastine  Furosemide  Lisinopril  Pantoprazole  Potassium  Pravastatin  Sucralfate ==================================================================== For clinical consultation, please call [phone]. ====================================================================     Laboratory Chemistry Profile   Renal Lab Results  Component Value Date   BUN 13 06/03/2020   CREATININE 0.75 64/15/8309   BCR NOT APPLICABLE 40/76/8088   GFRAA 90 06/03/2020   GFRNONAA 78 06/03/2020     Hepatic Lab Results  Component Value Date   AST 11 06/03/2020   ALT 10 06/03/2020   ALBUMIN 3.9 01/16/2017   ALKPHOS 73 01/16/2017   HCVAB NEGATIVE 02/16/2016     Electrolytes Lab Results  Component Value Date   NA 144 06/03/2020   K 3.9 06/03/2020   CL 107 06/03/2020   CALCIUM 9.0 06/03/2020   MG 2.1 11/05/2018     Bone Lab Results  Component Value Date   25OHVITD1 13 (L) 11/05/2018   25OHVITD2 <1.0 11/05/2018   25OHVITD3 13 11/05/2018     Inflammation (CRP: Acute Phase) (ESR: Chronic Phase) Lab Results  Component Value Date   CRP 11 (H) 11/05/2018   ESRSEDRATE 30 11/05/2018       Note: Above Lab results reviewed.  Imaging  VAS [place] LOWER EXTREMITY VENOUS REFLUX  Lower Venous Reflux Study  Performing Technologist: Almira Coaster RVS    Examination Guidelines: A complete evaluation includes B-mode imaging, spectral Doppler, color Doppler, and power Doppler as needed of all accessible portions of each vessel. Bilateral testing is considered an integral part of a complete examination. Limited examinations for reoccurring indications may be performed as noted. The reflux portion of the exam is performed with the patient in reverse Trendelenburg. Significant venous reflux is defined as >500 ms in the superficial venous system, and >1 second in the deep venous system.    Venous Reflux  Times +--------------+---------+------+-----------+------------+--------+ RIGHT         Reflux NoRefluxReflux TimeDiameter cmsComments                         Yes                                  +--------------+---------+------+-----------+------------+--------+ CFV           no                                             +--------------+---------+------+-----------+------------+--------+ FV prox       no                                             +--------------+---------+------+-----------+------------+--------+  FV mid        no                                             +--------------+---------+------+-----------+------------+--------+ FV dist       no                                             +--------------+---------+------+-----------+------------+--------+ Popliteal     no                                             +--------------+---------+------+-----------+------------+--------+ GSV at SFJ    no                            .67              +--------------+---------+------+-----------+------------+--------+ GSV prox thighno                            .70              +--------------+---------+------+-----------+------------+--------+ GSV mid thigh no                            .41              +--------------+---------+------+-----------+------------+--------+ GSV dist thighno                            .54              +--------------+---------+------+-----------+------------+--------+ GSV at knee   no                            .48              +--------------+---------+------+-----------+------------+--------+ GSV prox calf no                            .43              +--------------+---------+------+-----------+------------+--------+ SSV Pop Fossa no                            .39              +--------------+---------+------+-----------+------------+--------+     +--------------+---------+------+-----------+------------+--------+ LEFT          Reflux NoRefluxReflux TimeDiameter cmsComments                         Yes                                  +--------------+---------+------+-----------+------------+--------+ CFV           no                                             +--------------+---------+------+-----------+------------+--------+  FV prox       no                                             +--------------+---------+------+-----------+------------+--------+ FV mid        no                                             +--------------+---------+------+-----------+------------+--------+ FV dist       no                                             +--------------+---------+------+-----------+------------+--------+ Popliteal     no                                             +--------------+---------+------+-----------+------------+--------+ GSV at SFJ    no                            1.11             +--------------+---------+------+-----------+------------+--------+ GSV prox thighno                            .71              +--------------+---------+------+-----------+------------+--------+ GSV mid thigh no                            .53              +--------------+---------+------+-----------+------------+--------+ GSV dist thighno                            .44              +--------------+---------+------+-----------+------------+--------+ GSV at knee   no                            .40              +--------------+---------+------+-----------+------------+--------+ GSV prox calf no                            .39              +--------------+---------+------+-----------+------------+--------+ SSV Pop Fossa no                            .71              +--------------+---------+------+-----------+------------+--------+         Summary: Bilateral: - No evidence of deep vein thrombosis seen in the lower extremities, bilaterally, from the common femoral through the popliteal veins.    - No evidence of superficial venous thrombosis in the lower extremities, bilaterally.   - No evidence of deep venous insufficiency seen bilaterally in the lower extremity.   -  No evidence of superficial venous reflux seen in the greater saphenous veins bilaterally.   - No evidence of superficial venous reflux seen in the short saphenous veins bilaterally.   *See table(s) above for measurements and observations.  Electronically signed by Leotis Pain MD on 04/13/2020 at 10:45:22 AM.      Final    Assessment  The primary encounter diagnosis was Chronic pain syndrome. Diagnoses of Chronic low back pain (Primary Area of Pain) (Bilateral) (R>L) w/o sciatica, Lumbar facet syndrome (Bilateral) (R>L), Lumbar (6 mm) Anterolisthesis of L4/L5, and Chronic sacroiliac joint pain (Bilateral) (R>L) were also pertinent to this visit.  Plan of Care  Problem-specific:  No problem-specific Assessment & Plan notes found for this encounter.  Megan Perez has a current medication list which includes the following long-term medication(s): albuterol, calcium carbonate, lisinopril, pantoprazole, pregabalin, sucralfate, and tizanidine.  Pharmacotherapy (Medications Ordered): No orders of the defined types were placed in this encounter.  Orders:  Orders Placed This Encounter  Procedures  . LUMBAR FACET(MEDIAL BRANCH NERVE BLOCK) MBNB    Standing Status:   Future    Standing Expiration Date:   10/02/2020    Scheduling Instructions:     Procedure: Lumbar facet block (AKA.: Lumbosacral medial branch nerve block)     Side: Right-sided     Level: L3-4, L4-5, & L5-S1 Facets (L2, L3, L4, L5, & S1 Medial Branch Nerves)     Sedation: With Sedation.     Timeframe: ASAA    Order Specific Question:   Where will this procedure be performed?    Answer:    [facility]  . SACROILIAC JOINT INJECTION    Standing Status:   Future    Standing Expiration Date:   10/02/2020    Scheduling Instructions:     Side: Right-sided     Sedation: With Sedation.     Timeframe: ASAA    Order Specific Question:   Where will this procedure be performed?    Answer:   [facility]   Follow-up plan:   Return for Procedure (w/ sedation): (R) L-FCT BLK + (R) SI BLK.      Interventional treatment options:  Under consideration:   NOTE: NO further RFA attempts until she brings her BMI<35. (Technically difficult procedure due to intra-procedural non-compliance and very unreliable feedback.) Diagnostic bilateral SI joint blocks  Possible bilateral SI joint RFA  Diagnostic right L4 TFESI  Diagnostic right L3-4 LESI  Diagnostic bilateral genicular NB  Possible bilateral genicular nerve RFA    Therapeutic/palliative (PRN):   Diagnostic bilateral lumbar facet block #3  Palliative right lumbar facet RFA #2 (last done 08/07/2019) (Do not repeat until BMI<35) Palliative left lumbar facet RFA #2 (last done 09/02/2019) (Do not repeat until BMI<35)    Recent Visits Date Type Provider Dept  08/18/20 Telemedicine Milinda Pointer, MD [facility]  06/21/20 Office Visit Milinda Pointer, MD [facility]  Showing recent visits within past 90 days and meeting all other requirements Today's Visits Date Type Provider Dept  09/01/20 Telemedicine Milinda Pointer, MD [facility]  Showing today's visits and meeting all other requirements Future Appointments No visits were found meeting these conditions. Showing future appointments within next 90 days and meeting all other requirements  I discussed the assessment and treatment plan with the patient. The patient was provided an opportunity to ask questions and all were answered. The patient agreed with the plan and demonstrated an understanding of the instructions.  Patient  advised to call back or seek an in-person evaluation if the symptoms or condition worsens.  Duration of encounter: 18 minutes.  Note by: Gaspar Cola, MD Date: 09/01/2020; Time: 3:23 PM

## 2020-09-14 ENCOUNTER — Ambulatory Visit (HOSPITAL_BASED_OUTPATIENT_CLINIC_OR_DEPARTMENT_OTHER): Payer: Medicare HMO | Admitting: Pain Medicine

## 2020-09-14 ENCOUNTER — Ambulatory Visit
Admission: RE | Admit: 2020-09-14 | Discharge: 2020-09-14 | Disposition: A | Discharge status: Home / Self Care | Payer: Medicare HMO | Source: Ambulatory Visit | Attending: Pain Medicine | Admitting: Pain Medicine

## 2020-09-14 ENCOUNTER — Encounter: Payer: Self-pay | Admitting: Pain Medicine

## 2020-09-14 ENCOUNTER — Other Ambulatory Visit: Payer: Self-pay

## 2020-09-14 VITALS — BP 154/78 | HR 77 | Temp 97.2°F | Resp 14 | Ht 67.0 in | Wt 280.0 lb

## 2020-09-14 DIAGNOSIS — M47816 Spondylosis without myelopathy or radiculopathy, lumbar region: Secondary | ICD-10-CM | POA: Insufficient documentation

## 2020-09-14 DIAGNOSIS — M4310 Spondylolisthesis, site unspecified: Secondary | ICD-10-CM | POA: Diagnosis not present

## 2020-09-14 DIAGNOSIS — G8929 Other chronic pain: Secondary | ICD-10-CM

## 2020-09-14 DIAGNOSIS — M5137 Other intervertebral disc degeneration, lumbosacral region: Secondary | ICD-10-CM | POA: Diagnosis not present

## 2020-09-14 DIAGNOSIS — M545 Low back pain, unspecified: Secondary | ICD-10-CM | POA: Diagnosis not present

## 2020-09-14 DIAGNOSIS — M47898 Other spondylosis, sacral and sacrococcygeal region: Secondary | ICD-10-CM | POA: Diagnosis not present

## 2020-09-14 DIAGNOSIS — M47817 Spondylosis without myelopathy or radiculopathy, lumbosacral region: Secondary | ICD-10-CM | POA: Insufficient documentation

## 2020-09-14 DIAGNOSIS — M533 Sacrococcygeal disorders, not elsewhere classified: Secondary | ICD-10-CM | POA: Insufficient documentation

## 2020-09-14 MED ORDER — FENTANYL CITRATE (PF) 100 MCG/2ML IJ SOLN
25.0000 ug | INTRAMUSCULAR | Status: DC | PRN
  Administered 2020-09-14: 50 ug via INTRAVENOUS
  Filled 2020-09-14: qty 2

## 2020-09-14 MED ORDER — MIDAZOLAM HCL 5 MG/5ML IJ SOLN
1.0000 mg | INTRAMUSCULAR | Status: DC | PRN
  Administered 2020-09-14: 2 mg via INTRAVENOUS
  Filled 2020-09-14: qty 5

## 2020-09-14 MED ORDER — LACTATED RINGERS IV SOLN
1000.0000 mL | Freq: Once | INTRAVENOUS | Status: AC
  Administered 2020-09-14: 1000 mL via INTRAVENOUS

## 2020-09-14 MED ORDER — LIDOCAINE HCL 2 % IJ SOLN
20.0000 mL | Freq: Once | INTRAMUSCULAR | Status: AC
  Administered 2020-09-14: 400 mg
  Filled 2020-09-14: qty 400

## 2020-09-14 MED ORDER — ROPIVACAINE HCL 2 MG/ML IJ SOLN
4.0000 mL | Freq: Once | INTRAMUSCULAR | Status: AC
  Administered 2020-09-14: 4 mL via INTRA_ARTICULAR
  Filled 2020-09-14: qty 10

## 2020-09-14 MED ORDER — METHYLPREDNISOLONE ACETATE 80 MG/ML IJ SUSP
80.0000 mg | Freq: Once | INTRAMUSCULAR | Status: AC
  Administered 2020-09-14: 80 mg via INTRA_ARTICULAR
  Filled 2020-09-14: qty 1

## 2020-09-14 MED ORDER — ROPIVACAINE HCL 2 MG/ML IJ SOLN
9.0000 mL | Freq: Once | INTRAMUSCULAR | Status: AC
  Administered 2020-09-14: 9 mL via PERINEURAL
  Filled 2020-09-14: qty 10

## 2020-09-14 MED ORDER — TRIAMCINOLONE ACETONIDE 40 MG/ML IJ SUSP
40.0000 mg | Freq: Once | INTRAMUSCULAR | Status: AC
  Administered 2020-09-14: 40 mg
  Filled 2020-09-14: qty 1

## 2020-09-14 NOTE — Progress Notes (Signed)
PROVIDER NOTE: Information contained herein reflects review and annotations entered in association with encounter. Interpretation of such information and data should be left to medically-trained personnel. Information provided to patient can be located elsewhere in the medical record under "Patient Instructions". Document created using STT-dictation technology, any transcriptional errors that may result from process are unintentional.    Patient: Megan Perez  Service Category: Procedure  Provider: Oswaldo Done, MD  DOB: 31-May-1946  DOS: 09/14/2020  Location: [facility]  MRN: [id number]  Setting: Ambulatory - outpatient  Referring Provider: Saralyn Pilar *  Type: Established Patient  Specialty: Interventional Pain Management  PCP: Smitty Cords, DO   Primary Reason for Visit: Interventional Pain Management Treatment. CC: Procedure  Procedure:          Anesthesia, Analgesia, Anxiolysis:  Procedure #1: Type: Medial Branch Facet Block #3 Primary Purpose: Diagnostic/Therapeutric Region: Lumbar Level: L2, L3, L4, L5, & S1 Medial Branch Level(s) Target Area: For Lumbar Facet blocks, the target is the groove formed by the junction of the transverse process and superior articular process. For the L5 dorsal ramus, the target is the notch between superior articular process and sacral ala. For the S1 dorsal ramus, the target is the superior and lateral edge of the posterior S1 Sacral foramen. Approach: Posterior, paramedial, percutaneous approach. Laterality: Right  Procedure #2: Type: Sacroiliac Joint Block  #1  Primary Purpose: Diagnostic Region: Posterior Lumbosacral Level: PSIS (Posterior Superior Iliac Spine) Sacroiliac Joint Target Area: For upper sacroiliac joint block(s), the target is the superior and posterior margin of the sacroiliac joint. Approach: Ipsilateral approach. Laterality: Right  Type: Moderate (Conscious) Sedation combined with Local  Anesthesia Indication(s): Analgesia and Anxiety Route: Intravenous (IV) IV Access: Secured Sedation: Meaningful verbal contact was maintained at all times during the procedure  Local Anesthetic: Lidocaine 1-2%  Position: Prone   Indications: 1. Lumbar facet syndrome (Bilateral) (R>L)   2. Spondylosis without myelopathy or radiculopathy, lumbosacral region   3. Lumbar (6 mm) Anterolisthesis of L4/L5   4. DDD (degenerative disc disease), lumbosacral   5. Chronic sacroiliac joint pain (Bilateral) (R>L)   6. Other spondylosis, sacral and sacrococcygeal region   7. Chronic low back pain (Primary Area of Pain) (Bilateral) (R>L) w/o sciatica    Pain Score: Pre-procedure: 9 /10 Post-procedure: 0-No pain/10   Pre-op H&P Assessment:  [name] is a 74 y.o. (year old), female patient, seen today for interventional treatment. She  has a past surgical history that includes Abdominal hysterectomy; Cholecystectomy; Replacement total knee; Heel spur surgery; Shoulder surgery (Left); Shoulder arthroscopy with open rotator cuff repair (Right, 09/15/2015); Replacement total knee (Right); Carpal tunnel release (Right); and Colonoscopy with propofol (N/A, 06/19/2017). [name] has a current medication list which includes the following prescription(s): acetaminophen, albuterol, azelastine, calcium carbonate, diclofenac sodium, lisinopril, pantoprazole, pregabalin, restasis, sucralfate, tizanidine, and aspirin ec, and the following Facility-Administered Medications: fentanyl and midazolam. Her primarily concern today is the Procedure  Initial Vital Signs:  Pulse/HCG Rate: 77ECG Heart Rate: 81 Temp: (!) 97 F (36.1 C) Resp: 18 BP: (!) 158/91 SpO2: 98 %  BMI: Estimated body mass index is 43.85 kg/m as calculated from the following:   Height as of this encounter: 5\' 7"  (1.702 m).   Weight as of this encounter: 280 lb (127 kg).  Risk Assessment: Allergies: Reviewed. She is allergic to baclofen,  meclizine, other, penicillins, prednisone, prevacid [lansoprazole], gabapentin, and oxycodone.  Allergy Precautions: None required Coagulopathies: Reviewed. None identified.  Blood-thinner therapy: None  at this time Active Infection(s): Reviewed. None identified. [name] is afebrile  Site Confirmation: [name] was asked to confirm the procedure and laterality before marking the site Procedure checklist: Completed Consent: Before the procedure and under the influence of no sedative(s), amnesic(s), or anxiolytics, the patient was informed of the treatment options, risks and possible complications. To fulfill our ethical and legal obligations, as recommended by the American Medical Association's Code of Ethics, I have informed the patient of my clinical impression; the nature and purpose of the treatment or procedure; the risks, benefits, and possible complications of the intervention; the alternatives, including doing nothing; the risk(s) and benefit(s) of the alternative treatment(s) or procedure(s); and the risk(s) and benefit(s) of doing nothing. The patient was provided information about the general risks and possible complications associated with the procedure. These may include, but are not limited to: failure to achieve desired goals, infection, bleeding, organ or nerve damage, allergic reactions, paralysis, and death. In addition, the patient was informed of those risks and complications associated to Spine-related procedures, such as failure to decrease pain; infection (i.e.: Meningitis, epidural or intraspinal abscess); bleeding (i.e.: epidural hematoma, subarachnoid hemorrhage, or any other type of intraspinal or peri-dural bleeding); organ or nerve damage (i.e.: Any type of peripheral nerve, nerve root, or spinal cord injury) with subsequent damage to sensory, motor, and/or autonomic systems, resulting in permanent pain, numbness, and/or weakness of one or several areas of the body;  allergic reactions; (i.e.: anaphylactic reaction); and/or death. Furthermore, the patient was informed of those risks and complications associated with the medications. These include, but are not limited to: allergic reactions (i.e.: anaphylactic or anaphylactoid reaction(s)); adrenal axis suppression; blood sugar elevation that in diabetics may result in ketoacidosis or comma; water retention that in patients with history of congestive heart failure may result in shortness of breath, pulmonary edema, and decompensation with resultant heart failure; weight gain; swelling or edema; medication-induced neural toxicity; particulate matter embolism and blood vessel occlusion with resultant organ, and/or nervous system infarction; and/or aseptic necrosis of one or more joints. Finally, the patient was informed that Medicine is not an exact science; therefore, there is also the possibility of unforeseen or unpredictable risks and/or possible complications that may result in a catastrophic outcome. The patient indicated having understood very clearly. We have given the patient no guarantees and we have made no promises. Enough time was given to the patient to ask questions, all of which were answered to the patient's satisfaction. Ms. Lungren has indicated that she wanted to continue with the procedure. Attestation: I, the ordering provider, attest that I have discussed with the patient the benefits, risks, side-effects, alternatives, likelihood of achieving goals, and potential problems during recovery for the procedure that I have provided informed consent. Date  Time: 09/14/2020  8:54 AM  Pre-Procedure Preparation:  Monitoring: As per clinic protocol. Respiration, ETCO2, SpO2, BP, heart rate and rhythm monitor placed and checked for adequate function Safety Precautions: Patient was assessed for positional comfort and pressure points before starting the procedure. Time-out: I initiated and conducted the  "Time-out" before starting the procedure, as per protocol. The patient was asked to participate by confirming the accuracy of the "Time Out" information. Verification of the correct person, site, and procedure were performed and confirmed by me, the nursing staff, and the patient. "Time-out" conducted as per Joint Commission's Universal Protocol (UP.01.01.01). Time: 1007  Description of Procedure #1:   Time-out: "Time-out" completed before starting procedure, as per protocol. Area Prepped: Entire  Posterior Lumbosacral Region DuraPrep (Iodine Povacrylex [0.7% available iodine] and Isopropyl Alcohol, 74% w/w) Safety Precautions: Aspiration looking for blood return was conducted prior to all injections. At no point did we inject any substances, as a needle was being advanced. No attempts were made at seeking any paresthesias. Safe injection practices and needle disposal techniques used. Medications properly checked for expiration dates. SDV (single dose vial) medications used.  Description of the Procedure: Protocol guidelines were followed. The patient was placed in position over the fluoroscopy table. The target area was identified and the area prepped in the usual manner. Skin & deeper tissues infiltrated with local anesthetic. Appropriate amount of time allowed to pass for local anesthetics to take effect. The procedure needle was introduced through the skin, ipsilateral to the reported pain, and advanced to the target area. Employing the "Medial Branch Technique", the needles were advanced to the angle made by the superior and medial portion of the transverse process, and the lateral and inferior portion of the superior articulating process of the targeted vertebral bodies. This area is known as "Burton's Eye" or the "Eye of the [place] Dog". A procedure needle was introduced through the skin, and this time advanced to the angle made by the superior and medial border of the sacral ala, and the lateral  border of the S1 vertebral body. This last needle was later repositioned at the superior and lateral border of the posterior S1 foramen. Negative aspiration confirmed. Solution injected in intermittent fashion, asking for systemic symptoms every 0.5cc of injectate. The needles were then removed and the area cleansed, making sure to leave some of the prepping solution back to take advantage of its long term bactericidal properties. Start Time: 1008 hrs. Materials:  Needle(s) Type: Spinal Needle Gauge: 22G Length: 5-in Medication(s): Please see orders for medications and dosing details.  Description of Procedure # 2:   Area Prepped: Entire Posterior Lumbosacral Region DuraPrep (Iodine Povacrylex [0.7% available iodine] and Isopropyl Alcohol, 74% w/w) Safety Precautions: Aspiration looking for blood return was conducted prior to all injections. At no point did we inject any substances, as a needle was being advanced. No attempts were made at seeking any paresthesias. Safe injection practices and needle disposal techniques used. Medications properly checked for expiration dates. SDV (single dose vial) medications used. Description of the Procedure: Protocol guidelines were followed. The patient was placed in position over the fluoroscopy table. The target area was identified and the area prepped in the usual manner. Skin & deeper tissues infiltrated with local anesthetic. Appropriate amount of time allowed to pass for local anesthetics to take effect. The procedure needle was advanced under fluoroscopic guidance into the sacroiliac joint until a firm endpoint was obtained. Proper needle placement secured. Negative aspiration confirmed. Solution injected in intermittent fashion, asking for systemic symptoms every 0.5cc of injectate. The needles were then removed and the area cleansed, making sure to leave some of the prepping solution back to take advantage of its long term bactericidal properties. Vitals:    09/14/20 1017 09/14/20 1027 09/14/20 1037 09/14/20 1047  BP: (!) 159/94 (!) 162/85 (!) 160/81 (!) 154/78  Pulse:      Resp: 16 14 14 14   Temp:  (!) 97.3 F (36.3 C)  (!) 97.2 F (36.2 C)  TempSrc:      SpO2: 98% 100% 100% 100%  Weight:      Height:        End Time: 1017 hrs. Materials:  Needle(s) Type: Spinal Needle Gauge: 22G Length:  3.5-in Medication(s): Please see orders for medications and dosing details.  Imaging Guidance (Spinal):          Type of Imaging Technique: Fluoroscopy Guidance (Spinal) Indication(s): Assistance in needle guidance and placement for procedures requiring needle placement in or near specific anatomical locations not easily accessible without such assistance. Exposure Time: Please see nurses notes. Contrast: None used. Fluoroscopic Guidance: I was personally present during the use of fluoroscopy. "Tunnel Vision Technique" used to obtain the best possible view of the target area. Parallax error corrected before commencing the procedure. "Direction-depth-direction" technique used to introduce the needle under continuous pulsed fluoroscopy. Once target was reached, antero-posterior, oblique, and lateral fluoroscopic projection used confirm needle placement in all planes. Images permanently stored in EMR. Interpretation: No contrast injected. I personally interpreted the imaging intraoperatively. Adequate needle placement confirmed in multiple planes. Permanent images saved into the patient's record.  Antibiotic Prophylaxis:   Anti-infectives (From admission, onward)   None     Indication(s): None identified  Post-operative Assessment:  Post-procedure Vital Signs:  Pulse/HCG Rate: 7768 Temp: (!) 97.2 F (36.2 C) Resp: 14 BP: (!) 154/78 SpO2: 100 %  EBL: None  Complications: No immediate post-treatment complications observed by team, or reported by patient.  Note: The patient tolerated the entire procedure well. A repeat set of vitals were  taken after the procedure and the patient was kept under observation following institutional policy, for this type of procedure. Post-procedural neurological assessment was performed, showing return to baseline, prior to discharge. The patient was provided with post-procedure discharge instructions, including a section on how to identify potential problems. Should any problems arise concerning this procedure, the patient was given instructions to immediately contact us, at any time, without hesitation. In any case, we plan to contact the patient by telephone for a follow-up status report regarding this interventional procedure.  Comments:  No additional relevant information.  Plan of Care  Orders:  Orders Placed This Encounter  Procedures  . LUMBAR FACET(MEDIAL BRANCH NERVE BLOCK) MBNB    Scheduling Instructions:     Procedure: Lumbar facet block (AKA.: Lumbosacral medial branch nerve block)     Side: Right-sided     Level: L3-4, L4-5, & L5-S1 Facets (L2, L3, L4, L5, & S1 Medial Branch Nerves)     Sedation: Patient's choice.     Timeframe: Today    Order Specific Question:   Where will this procedure be performed?    Answer:   [facility]  . SACROILIAC JOINT INJECTION    Scheduling Instructions:     Side: Right-sided     Sedation: Patient's choice.     Timeframe: Today    Order Specific Question:   Where will this procedure be performed?    Answer:   [facility]  . DG PAIN CLINIC C-ARM 1-60 MIN NO REPORT    Intraoperative interpretation by procedural physician at [facility].    Standing Status:   Standing    Number of Occurrences:   1    Order Specific Question:   Reason for exam:    Answer:   Assistance in needle guidance and placement for procedures requiring needle placement in or near specific anatomical locations not easily accessible without such assistance.  . Informed Consent Details: Physician/Practitioner Attestation; Transcribe to consent form  and obtain patient signature    Nursing Order: Transcribe to consent form and obtain patient signature. Note: Always confirm laterality of pain with Ms. Leaton, before procedure.    Order  Specific Question:   Physician/Practitioner attestation of informed consent for procedure/surgical case    Answer:   I, the physician/practitioner, attest that I have discussed with the patient the benefits, risks, side effects, alternatives, likelihood of achieving goals and potential problems during recovery for the procedure that I have provided informed consent.    Order Specific Question:   Procedure    Answer:   Lumbar Facet Block  under fluoroscopic guidance    Order Specific Question:   Physician/Practitioner performing the procedure    Answer:   Reise Hietala A. Laban Emperor MD    Order Specific Question:   Indication/Reason    Answer:   Low Back Pain, with our without leg pain, due to Facet Joint Arthralgia (Joint Pain) Spondylosis (Arthritis of the Spine), without myelopathy or radiculopathy (Nerve Damage).  . Informed Consent Details: Physician/Practitioner Attestation; Transcribe to consent form and obtain patient signature    Nursing Order: Transcribe to consent form and obtain patient signature. Note: Always confirm laterality of pain with Ms. Kuehnle, before procedure.    Order Specific Question:   Physician/Practitioner attestation of informed consent for procedure/surgical case    Answer:   I, the physician/practitioner, attest that I have discussed with the patient the benefits, risks, side effects, alternatives, likelihood of achieving goals and potential problems during recovery for the procedure that I have provided informed consent.    Order Specific Question:   Procedure    Answer:   Sacroiliac Joint Block    Order Specific Question:   Physician/Practitioner performing the procedure    Answer:   Haydn Hutsell A. Laban Emperor, MD    Order Specific Question:   Indication/Reason    Answer:   Chronic Low Back  and Hip Pain secondary to Sacroiliac Joint Pain (Arthralgia/Arthropathy)  . Provide equipment / supplies at bedside    "Block Tray" (Disposable  single use) Needle type: SpinalSpinal Amount/quantity: 5 Size: Medium (5-inch) Gauge: 22G    Standing Status:   Standing    Number of Occurrences:   1    Order Specific Question:   Specify    Answer:   Block Tray   Chronic Opioid Analgesic:  No opioid analgesics from our practice.   Medications ordered for procedure: Meds ordered this encounter  Medications  . lidocaine (XYLOCAINE) 2 % (with pres) injection 400 mg  . lactated ringers infusion 1,000 mL  . midazolam (VERSED) 5 MG/5ML injection 1-2 mg    Make sure Flumazenil is available in the pyxis when using this medication. If oversedation occurs, administer 0.2 mg IV over 15 sec. If after 45 sec no response, administer 0.2 mg again over 1 min; may repeat at 1 min intervals; not to exceed 4 doses (1 mg)  . fentaNYL (SUBLIMAZE) injection 25-50 mcg    Make sure Narcan is available in the pyxis when using this medication. In the event of respiratory depression (RR< 8/min): Titrate NARCAN (naloxone) in increments of 0.1 to 0.2 mg IV at 2-3 minute intervals, until desired degree of reversal.  . ropivacaine (PF) 2 mg/mL (0.2%) (NAROPIN) injection 9 mL  . triamcinolone acetonide (KENALOG-40) injection 40 mg  . ropivacaine (PF) 2 mg/mL (0.2%) (NAROPIN) injection 4 mL  . methylPREDNISolone acetate (DEPO-MEDROL) injection 80 mg   Medications administered: We administered lidocaine, lactated ringers, midazolam, fentaNYL, ropivacaine (PF) 2 mg/mL (0.2%), triamcinolone acetonide, ropivacaine (PF) 2 mg/mL (0.2%), and methylPREDNISolone acetate.  See the medical record for exact dosing, route, and time of administration.  Follow-up plan:   Return in about  2 weeks (around 09/28/2020) for (F2F), (PP) Follow-up.       Interventional treatment options:  Under consideration:   NOTE: NO further RFA  attempts until she brings her BMI<35. (Technically difficult procedure due to intra-procedural non-compliance and very unreliable feedback.) Diagnostic bilateral SI joint blocks  Possible bilateral SI joint RFA  Diagnostic right L4 TFESI  Diagnostic right L3-4 LESI  Diagnostic bilateral genicular NB  Possible bilateral genicular nerve RFA    Therapeutic/palliative (PRN):   Diagnostic bilateral lumbar facet block #3  Palliative right lumbar facet RFA #2 (last done 08/07/2019) (Do not repeat until BMI<35) Palliative left lumbar facet RFA #2 (last done 09/02/2019) (Do not repeat until BMI<35)     Recent Visits Date Type Provider Dept  09/01/20 Telemedicine Delano Metz, MD [facility]  08/18/20 Telemedicine Delano Metz, MD [facility]  06/21/20 Office Visit Delano Metz, MD [facility]  Showing recent visits within past 90 days and meeting all other requirements Today's Visits Date Type Provider Dept  09/14/20 Procedure visit Delano Metz, MD [facility]  Showing today's visits and meeting all other requirements Future Appointments Date Type Provider Dept  09/27/20 Appointment Delano Metz, MD [facility]  Showing future appointments within next 90 days and meeting all other requirements  Disposition: Discharge home  Discharge (Date  Time): 09/14/2020; 1048 hrs.   Primary Care Physician: Smitty Cords, DO Location: [facility] Note by: Oswaldo Done, MD Date: 09/14/2020; Time: 4:03 PM  Disclaimer:  Medicine is not an Visual merchandiser. The only guarantee in medicine is that nothing is guaranteed. It is important to note that the decision to proceed with this intervention was based on the information collected from the patient. The Data and conclusions were drawn from the patient's questionnaire, the interview, and the physical examination. Because the  information was provided in large part by the patient, it cannot be guaranteed that it has not been purposely or unconsciously manipulated. Every effort has been made to obtain as much relevant data as possible for this evaluation. It is important to note that the conclusions that lead to this procedure are derived in large part from the available data. Always take into account that the treatment will also be dependent on availability of resources and existing treatment guidelines, considered by other Pain Management Practitioners as being common knowledge and practice, at the time of the intervention. For Medico-Legal purposes, it is also important to point out that variation in procedural techniques and pharmacological choices are the acceptable norm. The indications, contraindications, technique, and results of the above procedure should only be interpreted and judged by a Board-Certified Interventional Pain Specialist with extensive familiarity and expertise in the same exact procedure and technique.

## 2020-09-14 NOTE — Progress Notes (Signed)
Safety precautions to be maintained throughout the outpatient stay will include: orient to surroundings, keep bed in low position, maintain call bell within reach at all times, provide assistance with transfer out of bed and ambulation.  

## 2020-09-14 NOTE — Patient Instructions (Signed)

## 2020-09-15 ENCOUNTER — Telehealth: Payer: Self-pay | Admitting: *Deleted

## 2020-09-15 NOTE — Telephone Encounter (Signed)
Called for post procedure check. Denies any problems. 

## 2020-09-16 ENCOUNTER — Other Ambulatory Visit: Payer: Self-pay | Admitting: Family Medicine

## 2020-09-16 DIAGNOSIS — K219 Gastro-esophageal reflux disease without esophagitis: Secondary | ICD-10-CM

## 2020-09-16 DIAGNOSIS — R1084 Generalized abdominal pain: Secondary | ICD-10-CM

## 2020-09-16 MED ORDER — SUCRALFATE 1 G PO TABS: ORAL_TABLET | ORAL | 2 refills | Status: DC

## 2020-09-16 NOTE — Telephone Encounter (Signed)
Confirm with the patient she does not want Rx send to  [facility] since it is not covered through her insurance and she is taking Carafate once daily send to [facility] in [place], last Rx was send 06/2020 --60 tab with 2 refill.

## 2020-09-16 NOTE — Telephone Encounter (Signed)
Medication Refill - Medication: Carafate 1 gm  Has the patient contacted their pharmacy? No. (Agent: If no, request that the patient contact the pharmacy for the refill.) (Agent: If yes, when and what did the pharmacy advise?)  Preferred Pharmacy (with phone number or street name): [facility] [address]  Agent: Please be advised that RX refills may take up to 3 business days. We ask that you follow-up with your pharmacy.

## 2020-09-21 ENCOUNTER — Telehealth: Payer: Self-pay

## 2020-09-21 NOTE — Telephone Encounter (Signed)
The pt was notified that the letter was released to her mychart. She said she will log in and try to retreive the letter from there. I informed her if she is unable to retreive the letter to give [place] a call back and we will get it emailed out for her. She verbalize understanding.

## 2020-09-21 NOTE — Telephone Encounter (Signed)
Copied from CRM [id number]. Topic: General - Inquiry >> Sep 21, 2020  9:28 AM Daphine Deutscher D wrote:  Pt called saying she needs a note saying the trash company needs to come down to her house and pick up her trash.  She has recently had surgery and cant roll the can down the driveway.  She would like a note emailed to [email or web address]  Name:  Ms. Randa Evens   CB#  [phone]

## 2020-09-21 NOTE — Telephone Encounter (Signed)
Ok, I do not see that she specified how long she needs this assistance for. So I recommended 6 weeks only.  I wrote the letter, sent it to her mychart and printed it.  I believe Morrie Sheldon will need to send it via email after scanning it later this week if she needs, but first - could you notify patient and see if she can access it on mychart to submit it directly herself?  Saralyn Pilar, DO [facility] 09/21/2020, 10:15 AM

## 2020-09-26 NOTE — Progress Notes (Signed)
PROVIDER NOTE: Information contained herein reflects review and annotations entered in association with encounter. Interpretation of such information and data should be left to medically-trained personnel. Information provided to patient can be located elsewhere in the medical record under "Patient Instructions". Document created using STT-dictation technology, any transcriptional errors that may result from process are unintentional.    Patient: Megan Perez  Service Category: E/M  Provider: Oswaldo Done, MD  DOB: [birth year]  DOS: 09/27/2020  Specialty: Interventional Pain Management  MRN: [id number]  Setting: Ambulatory outpatient  PCP: Smitty Cords, DO  Type: Established Patient    Referring Provider: Saralyn Pilar *  Location: Office  Delivery: Face-to-face     HPI  [name], a 75 y.o. year old female, is here today because of her Chronic pain syndrome [G89.4]. Ms. Rooker primary complain today is Back Pain (No pain today) Last encounter: My last encounter with her was on 09/14/2020. Pertinent problems: Ms. Heming has Arthritis; History of total knee replacement (Right); Degenerative spondylolisthesis; Full thickness rotator cuff tear; Impingement syndrome of shoulder region; Osteoarthrosis, localized, primary, knee; Rotator cuff rupture, complete; Chronic low back pain (Primary area of Pain) (Bilateral) (R>L) w/ sciatica (Right); Chronic lower extremity pain (Secondary Area of Pain) (Right); Chronic knee pain (Tertiary Area of Pain) (Bilateral) (R>L); Chronic pain syndrome; History of knee replacement, total (Left); Chronic knee pain after total knee replacement (Bilateral); Chronic sacroiliac joint pain (Bilateral) (R>L); Lumbar facet syndrome (Bilateral) (R>L); Lumbar (6 mm) Anterolisthesis of L4/L5; DDD (degenerative disc disease), lumbosacral; Spondylosis without myelopathy or radiculopathy, lumbosacral region; Chronic low back pain (Primary Area of Pain)  (Bilateral) (R>L) w/o sciatica; Chronic peripheral neuropathic pain; Neurogenic pain; Chronic lumbar radiculitis (L5) (Right); Chronic musculoskeletal pain; Chronic hip pain (Left); Chronic sacroiliac joint pain (Left); Chronic groin pain (Left); Lymphedema of both lower extremities; Drug-induced myopathy; Swelling of limb; Muscle spasms of both lower extremities; and Other spondylosis, sacral and sacrococcygeal region on their pertinent problem list. Pain Assessment: Severity of Chronic pain is reported as a 0-No pain/10. Location: Back Lower/Denies. Onset: More than a month ago. Quality:  . Timing: Intermittent. Modifying factor(s): procedure and meds. Vitals:  height is 5\' 7"  (1.702 m) and weight is 268 lb (121.6 kg). Her temperature is 96.4 F (35.8 C) (abnormal). Her blood pressure is 135/80 and her pulse is 72. Her oxygen saturation is 99%.   Reason for encounter: post-procedure assessment.  Patient's BMI is still at 43.85 kg/m.  Nonopioids transferred 08/18/2020: Calcium, Lyrica, and Zanaflex.  Post-Procedure Evaluation  Procedure (09/14/2020): Therapeutic right lumbar facet MBB #3 + diagnostic right sacroiliac joint block #1 under fluoroscopic guidance and IV sedation Pre-procedure pain level: 9/10 Post-procedure: 0/10 (100% relief)  Sedation: Sedation provided.  Effectiveness during initial hour after procedure(Ultra-Short Term Relief): 100 %.  Local anesthetic used: Long-acting (4-6 hours) Effectiveness: Defined as any analgesic benefit obtained secondary to the administration of local anesthetics. This carries significant diagnostic value as to the etiological location, or anatomical origin, of the pain. Duration of benefit is expected to coincide with the duration of the local anesthetic used.  Effectiveness during initial 4-6 hours after procedure(Short-Term Relief): 100 %.  Long-term benefit: Defined as any relief past the pharmacologic duration of the local anesthetics.   Effectiveness past the initial 6 hours after procedure(Long-Term Relief): 100 %.  Current benefits: Defined as benefit that persist at this time.   Analgesia:  90-100% better Function: Ms. Bredesen reports improvement in function ROM: Ms. Kosmalski reports improvement in  ROM  Pharmacotherapy Assessment   Analgesic: No opioid analgesics from our practice.   Monitoring: [place] PMP: PDMP reviewed during this encounter.       Pharmacotherapy: No side-effects or adverse reactions reported. Compliance: No problems identified. Effectiveness: Clinically acceptable.  Brigitte Pulse, RN  09/27/2020 11:34 AM  Sign when Signing Visit Safety precautions to be maintained throughout the outpatient stay will include: orient to surroundings, keep bed in low position, maintain call bell within reach at all times, provide assistance with transfer out of bed and ambulation.     UDS:  Summary  Date Value Ref Range Status  11/05/2018 FINAL  Final    Comment:    ==================================================================== [facility] COMP DRUG ANALYSIS,UR ==================================================================== Test                             Result       Flag       Units Drug Present and Declared for Prescription Verification   Acetaminophen                  PRESENT      EXPECTED Drug Absent but Declared for Prescription Verification   Baclofen                       Not Detected UNEXPECTED   Diclofenac                     Not Detected UNEXPECTED    Topical diclofenac, as indicated in the declared medication list,    is not always detected even when used as directed.   Salicylate                     Not Detected UNEXPECTED    Aspirin, as indicated in the declared medication list, is not    always detected even when used as directed. ==================================================================== Test                      Result    Flag   Units      Ref Range   Creatinine               93               mg/dL      >=16 ==================================================================== Declared Medications:  The flagging and interpretation on this report are based on the  following declared medications.  Unexpected results may arise from  inaccuracies in the declared medications.  **Note: The testing scope of this panel includes these medications:  Baclofen  **Note: The testing scope of this panel does not include small to  moderate amounts of these reported medications:  Acetaminophen  Aspirin (Aspirin 81)  Topical Diclofenac  **Note: The testing scope of this panel does not include following  reported medications:  Azelastine  Furosemide  Lisinopril  Pantoprazole  Potassium  Pravastatin  Sucralfate ==================================================================== For clinical consultation, please call [phone]. ====================================================================      ROS  Constitutional: Denies any fever or chills Gastrointestinal: No reported hemesis, hematochezia, vomiting, or acute GI distress Musculoskeletal: Denies any acute onset joint swelling, redness, loss of ROM, or weakness Neurological: No reported episodes of acute onset apraxia, aphasia, dysarthria, agnosia, amnesia, paralysis, loss of coordination, or loss of consciousness  Medication Review  acetaminophen, albuterol, azelastine, calcium carbonate, cycloSPORINE, diclofenac sodium, lisinopril, pantoprazole, pregabalin, sucralfate, and tiZANidine  History  Review  Allergy: Ms. Quamme is allergic to baclofen, meclizine, other, penicillins, prednisone, prevacid [lansoprazole], gabapentin, and oxycodone. Drug: Ms. Lout  reports no history of drug use. Alcohol:  reports no history of alcohol use. Tobacco:  reports that she has never smoked. She has never used smokeless tobacco. Social: Ms. Felmlee  reports that she has never smoked. She has never used smokeless  tobacco. She reports that she does not drink alcohol and does not use drugs. Medical:  has a past medical history of Anxiety, Asthma, Bronchitis, GERD (gastroesophageal reflux disease), Hypertension, Morbid obesity with BMI of 40.0-44.9, adult (HCC) (04/08/2012), OA (osteoarthritis), Osteoporosis, Prediabetes, Sinus infection, and Venous (peripheral) insufficiency (08/19/2010). Surgical: Ms. Kramm  has a past surgical history that includes Abdominal hysterectomy; Cholecystectomy; Replacement total knee; Heel spur surgery; Shoulder surgery (Left); Shoulder arthroscopy with open rotator cuff repair (Right, 09/15/2015); Replacement total knee (Right); Carpal tunnel release (Right); and Colonoscopy with propofol (N/A, 06/19/2017). Family: family history includes Healthy in her sister; Heart disease in her father; Hypertension in her mother, sister, sister, and sister; Subarachnoid hemorrhage in her mother and sister. She was adopted.  Laboratory Chemistry Profile   Renal Lab Results  Component Value Date   BUN 13 06/03/2020   CREATININE 0.75 06/03/2020   BCR NOT APPLICABLE 06/03/2020   GFRAA 90 06/03/2020   GFRNONAA 78 06/03/2020     Hepatic Lab Results  Component Value Date   AST 11 06/03/2020   ALT 10 06/03/2020   ALBUMIN 3.9 01/16/2017   ALKPHOS 73 01/16/2017   HCVAB NEGATIVE 02/16/2016     Electrolytes Lab Results  Component Value Date   NA 144 06/03/2020   K 3.9 06/03/2020   CL 107 06/03/2020   CALCIUM 9.0 06/03/2020   MG 2.1 11/05/2018     Bone Lab Results  Component Value Date   25OHVITD1 13 (L) 11/05/2018   25OHVITD2 <1.0 11/05/2018   25OHVITD3 13 11/05/2018     Inflammation (CRP: Acute Phase) (ESR: Chronic Phase) Lab Results  Component Value Date   CRP 11 (H) 11/05/2018   ESRSEDRATE 30 11/05/2018       Note: Above Lab results reviewed.  Recent Imaging Review  DG PAIN CLINIC C-ARM 1-60 MIN NO REPORT Fluoro was used, but no Radiologist interpretation will be  provided.  Please refer to "NOTES" tab for provider progress note. Note: Reviewed        Physical Exam  General appearance: Well nourished, well developed, and well hydrated. In no apparent acute distress Mental status: Alert, oriented x 3 (person, place, & time)       Respiratory: No evidence of acute respiratory distress Eyes: PERLA Vitals: BP 135/80   Pulse 72   Temp (!) 96.4 F (35.8 C)   Ht 5\' 7"  (1.702 m)   Wt 268 lb (121.6 kg)   SpO2 99%   BMI 41.97 kg/m  BMI: Estimated body mass index is 41.97 kg/m as calculated from the following:   Height as of this encounter: 5\' 7"  (1.702 m).   Weight as of this encounter: 268 lb (121.6 kg). Ideal: Ideal body weight: 61.6 kg (135 lb 12.9 oz) Adjusted ideal body weight: 85.6 kg (188 lb 10.9 oz)  Assessment   Status Diagnosis  Controlled Controlled Controlled 1. Chronic pain syndrome   2. Lumbar facet syndrome (Bilateral) (R>L)   3. Lumbar (6 mm) Anterolisthesis of L4/L5   4. Chronic sacroiliac joint pain (Bilateral) (R>L)   5. Chronic low back pain (Primary Area of Pain) (  Bilateral) (R>L) w/o sciatica   6. Chronic lower extremity pain (Secondary Area of Pain) (Right)   7. Chronic knee pain (Tertiary Area of Pain) (Bilateral) (R>L)   8. Chronic peripheral neuropathic pain      Updated Problems: No problems updated.  Plan of Care  Problem-specific:  No problem-specific Assessment & Plan notes found for this encounter.  Ms. RAPHAELA WILLITTS has a current medication list which includes the following long-term medication(s): albuterol, calcium carbonate, lisinopril, pantoprazole, pregabalin, sucralfate, and tizanidine.  Pharmacotherapy (Medications Ordered): No orders of the defined types were placed in this encounter.  Orders:  No orders of the defined types were placed in this encounter.  Follow-up plan:   Return if symptoms worsen or fail to improve.      Interventional treatment options:  Under consideration:   NOTE:  NO further RFA attempts until she brings her BMI<35. (Technically difficult procedure due to intra-procedural non-compliance and very unreliable feedback.) Diagnostic bilateral SI joint blocks  Possible bilateral SI joint RFA  Diagnostic right L4 TFESI  Diagnostic right L3-4 LESI  Diagnostic bilateral genicular NB  Possible bilateral genicular nerve RFA    Therapeutic/palliative (PRN):   Diagnostic bilateral lumbar facet block #3  Palliative right lumbar facet RFA #2 (last done 08/07/2019) (Do not repeat until BMI<35) Palliative left lumbar facet RFA #2 (last done 09/02/2019) (Do not repeat until BMI<35)      Recent Visits Date Type Provider Dept  09/14/20 Procedure visit Delano Metz, MD [facility]  09/01/20 Telemedicine Delano Metz, MD [facility]  08/18/20 Telemedicine Delano Metz, MD [facility]  Showing recent visits within past 90 days and meeting all other requirements Today's Visits Date Type Provider Dept  09/27/20 Office Visit Delano Metz, MD [facility]  Showing today's visits and meeting all other requirements Future Appointments No visits were found meeting these conditions. Showing future appointments within next 90 days and meeting all other requirements  I discussed the assessment and treatment plan with the patient. The patient was provided an opportunity to ask questions and all were answered. The patient agreed with the plan and demonstrated an understanding of the instructions.  Patient advised to call back or seek an in-person evaluation if the symptoms or condition worsens.  Duration of encounter: 24 minutes.  Note by: Oswaldo Done, MD Date: 09/27/2020; Time: 12:16 PM

## 2020-09-27 ENCOUNTER — Other Ambulatory Visit: Payer: Self-pay

## 2020-09-27 ENCOUNTER — Encounter: Payer: Self-pay | Admitting: Pain Medicine

## 2020-09-27 ENCOUNTER — Ambulatory Visit: Payer: Medicare Other | Attending: Pain Medicine | Admitting: Pain Medicine

## 2020-09-27 VITALS — BP 135/80 | HR 72 | Temp 96.4°F | Ht 67.0 in | Wt 268.0 lb

## 2020-09-27 DIAGNOSIS — M25561 Pain in right knee: Secondary | ICD-10-CM | POA: Diagnosis not present

## 2020-09-27 DIAGNOSIS — M25562 Pain in left knee: Secondary | ICD-10-CM

## 2020-09-27 DIAGNOSIS — G8929 Other chronic pain: Secondary | ICD-10-CM | POA: Diagnosis not present

## 2020-09-27 DIAGNOSIS — M792 Neuralgia and neuritis, unspecified: Secondary | ICD-10-CM

## 2020-09-27 DIAGNOSIS — M545 Low back pain, unspecified: Secondary | ICD-10-CM | POA: Insufficient documentation

## 2020-09-27 DIAGNOSIS — M4310 Spondylolisthesis, site unspecified: Secondary | ICD-10-CM | POA: Insufficient documentation

## 2020-09-27 DIAGNOSIS — M79604 Pain in right leg: Secondary | ICD-10-CM | POA: Insufficient documentation

## 2020-09-27 DIAGNOSIS — G894 Chronic pain syndrome: Secondary | ICD-10-CM

## 2020-09-27 DIAGNOSIS — M47816 Spondylosis without myelopathy or radiculopathy, lumbar region: Secondary | ICD-10-CM | POA: Insufficient documentation

## 2020-09-27 DIAGNOSIS — M533 Sacrococcygeal disorders, not elsewhere classified: Secondary | ICD-10-CM | POA: Diagnosis not present

## 2020-09-27 NOTE — Patient Instructions (Signed)
______________________________________________________________________________________________  Body mass index (BMI)  Body mass index (BMI) is a common tool for deciding whether a person has an appropriate body weight.  It measures a persons weight in relation to their height.   According to the [facility] of health (NIH):  A BMI of less than 18.5 means that a person is underweight.  A BMI of between 18.5 and 24.9 is ideal.  A BMI of between 25 and 29.9 is overweight.  A BMI over 30 indicates obesity.  Weight Management Required  URGENT: Your weight has been found to be adversely affecting your health.  Dear Megan Perez:  Your current Estimated body mass index is 43.85 kg/m as calculated from the following:   Height as of 09/14/20: 5\' 7"  (1.702 m).   Weight as of 09/14/20: 280 lb (127 kg).  Please use the table below to identify your weight category and associated incidence of chronic pain, secondary to your weight.  Body Mass Index (BMI) Classification BMI level (kg/m2) Category Associated incidence of chronic pain  <18  Underweight   18.5-24.9 Ideal body weight   25-29.9 Overweight  20%  30-34.9 Obese (Class I)  68%  35-39.9 Severe obesity (Class II)  136%  >40 Extreme obesity (Class III)  254%   In addition: You will be considered "Morbidly Obese", if your BMI is above 30 and you have one or more of the following conditions which are known to be caused and/or directly associated with obesity: 1.    Type 2 Diabetes (Which in turn can lead to cardiovascular diseases (CVD), stroke, peripheral vascular diseases (PVD), retinopathy, nephropathy, and neuropathy) 2.    Cardiovascular Disease (High Blood Pressure; Congestive Heart Failure; High Cholesterol; Coronary Artery Disease; Angina; or History of Heart Attacks) 3.    Breathing problems (Asthma; obesity-hypoventilation syndrome; obstructive sleep apnea; chronic inflammatory airway disease; reactive airway disease; or  shortness of breath) 4.    Chronic kidney disease 5.    Liver disease (nonalcoholic fatty liver disease) 6.    High blood pressure 7.    Acid reflux (gastroesophageal reflux disease; heartburn) 8.    Osteoarthritis (OA) (with any of the following: hip pain; knee pain; and/or low back pain) 9.    Low back pain (Lumbar Facet Syndrome; and/or Degenerative Disc Disease) 10.  Hip pain (Osteoarthritis of hip) (For every 1 lbs of added body weight, there is a 2 lbs increase in pressure inside of each hip articulation. 1:2 mechanical relationship) 11.  Knee pain (Osteoarthritis of knee) (For every 1 lbs of added body weight, there is a 4 lbs increase in pressure inside of each knee articulation. 1:4 mechanical relationship) (patients with a BMI>30 kg/m2 were 6.8 times more likely to develop knee OA than normal-weight individuals) 12.  Cancer: Epidemiological studies have shown that obesity is a risk factor for: post-menopausal breast cancer; cancers of the endometrium, colon and kidney cancer; malignant adenomas of the oesophagus. Obese subjects have an approximately 1.5-3.5-fold increased risk of developing these cancers compared with normal-weight subjects, and it has been estimated that between 15 and 45% of these cancers can be attributed to overweight. More recent studies suggest that obesity may also increase the risk of other types of cancer, including pancreatic, hepatic and gallbladder cancer. (Ref: Obesity and cancer. Pischon T, Nthlings U, [employer] H. Proc Nutr Soc. 2008 May;67(2):128-45. doi: 10.1017/S0029665108006976.) The International Agency for Research on Cancer (IARC) has identified 13 cancers associated with overweight and obesity: meningioma, multiple myeloma, adenocarcinoma of the esophagus, and  cancers of the thyroid, postmenopausal breast cancer, gallbladder, stomach, liver, pancreas, kidney, ovaries, uterus, colon and rectal (colorectal) cancers. 43 percent of all cancers diagnosed in women  and 24 percent of those diagnosed in men are associated with overweight and obesity.  Recommendation: At this point it is urgent that you take a step back and concentrate in loosing weight. Dedicate 100% of your efforts on this task. Nothing else will improve your health more than bringing your weight down and your BMI to less than 30. If you are here, you probably have chronic pain. We know that most chronic pain patients have difficulty exercising secondary to their pain. For this reason, you must rely on proper nutrition and diet in order to lose the weight. If your BMI is above 40, you should seriously consider bariatric surgery. A realistic goal is to lose 10% of your body weight over a period of 12 months.  Be honest to yourself, if over time you have unsuccessfully tried to lose weight, then it is time for you to seek professional help and to enter a medically supervised weight management program, and/or undergo bariatric surgery. Stop procrastinating.   Pain management considerations:  1.    Pharmacological Problems: Be advised that the use of opioid analgesics (oxycodone; hydrocodone; morphine; methadone; codeine; and all of their derivatives) have been associated with decreased metabolism and weight gain.  For this reason, should we see that you are unable to lose weight while taking these medications, it may become necessary for [place] to taper down and indefinitely discontinue them.  2.    Technical Problems: The incidence of successful interventional therapies decreases as the patient's BMI increases. It is much more difficult to accomplish a safe and effective interventional therapy on a patient with a BMI above 35. 3.    Radiation Exposure Problems: The x-rays machine, used to accomplish injection therapies, will automatically increase their x-ray output in order to capture an appropriate bone image. This means that radiation exposure increases exponentially with the patient's BMI. (The higher the  BMI, the higher the radiation exposure.) Although the level of radiation used at a given time is still safe to the patient, it is not for the physician and/or assisting staff. Unfortunately, radiation exposure is accumulative. Because physicians and the staff have to do procedures and be exposed on a daily basis, this can result in health problems such as cancer and radiation burns. Radiation exposure to the staff is monitored by the radiation batches that they wear. The exposure levels are reported back to the staff on a quarterly basis. Depending on levels of exposure, physicians and staff may be obligated by law to decrease this exposure. This means that they have the right and obligation to refuse providing therapies where they may be overexposed to radiation. For this reason, physicians may decline to offer therapies such as radiofrequency ablation or implants to patients with a BMI above 40. 4.    Current Trends: Be advised that the current trend is to no longer offer certain therapies to patients with a BMI equal to, or above 35, due to increase perioperative risks, increased technical procedural difficulties, and excessive radiation exposure to healthcare personnel.  ______________________________________________________________________________________________

## 2020-09-27 NOTE — Progress Notes (Signed)
Safety precautions to be maintained throughout the outpatient stay will include: orient to surroundings, keep bed in low position, maintain call bell within reach at all times, provide assistance with transfer out of bed and ambulation.  

## 2020-11-02 ENCOUNTER — Other Ambulatory Visit: Payer: Self-pay | Admitting: Family Medicine

## 2020-11-02 DIAGNOSIS — K219 Gastro-esophageal reflux disease without esophagitis: Secondary | ICD-10-CM

## 2020-11-02 MED ORDER — PANTOPRAZOLE SODIUM 20 MG PO TBEC: 20.0000 mg | DELAYED_RELEASE_TABLET | Freq: Every day | ORAL | 0 refills | Status: DC

## 2020-11-02 NOTE — Telephone Encounter (Signed)
Copied from CRM [id number]. Topic: Quick Communication - Rx Refill/Question >> Nov 02, 2020 10:49 AM Megan Perez wrote: Medication: pantoprazole (PROTONIX) 20 MG tablet - patient has 0(zero) pills left  Has the patient contacted their pharmacy? Yes. Patient has been in contact with pharmacy and been directed to contact PCP  Preferred Pharmacy (with phone number or street name): [id number] [place], [place] - [address] AT [facility] OF [address] [place]  Phone:  [phone]  Agent: Please be advised that RX refills may take up to 3 business days. We ask that you follow-up with your pharmacy.

## 2020-11-25 ENCOUNTER — Telehealth: Payer: Self-pay | Admitting: Pain Medicine

## 2020-11-25 DIAGNOSIS — M792 Neuralgia and neuritis, unspecified: Secondary | ICD-10-CM

## 2020-11-25 DIAGNOSIS — M62838 Other muscle spasm: Secondary | ICD-10-CM

## 2020-11-25 DIAGNOSIS — M545 Low back pain, unspecified: Secondary | ICD-10-CM

## 2020-11-25 DIAGNOSIS — M7918 Myalgia, other site: Secondary | ICD-10-CM

## 2020-11-25 DIAGNOSIS — G8929 Other chronic pain: Secondary | ICD-10-CM

## 2020-11-25 MED ORDER — TIZANIDINE HCL 4 MG PO TABS: 4.0000 mg | ORAL_TABLET | Freq: Every day | ORAL | 2 refills | Status: DC

## 2020-11-25 MED ORDER — PREGABALIN 100 MG PO CAPS: 100.0000 mg | ORAL_CAPSULE | Freq: Every day | ORAL | 2 refills | Status: DC

## 2020-11-25 NOTE — Telephone Encounter (Signed)
Patient called requesting refill on pregabalin and tizanidine. Only has enough for today. I explained Dr. Shauna Hugh is out of office until Monday. Can we ask if DR. Lateef or Dr. Pernell Dupre can do one month and I will get appt for patient to come in.  Please advise patient of solution

## 2020-11-25 NOTE — Telephone Encounter (Signed)
Dr Cherylann Ratel, this patient was not given a med refill appointment at last visit.  She is out of her Lyrica and Tizanidine.  Can I put her on as a virtual for you today or could you just sent her in 1 month script for these medications until they can get her in for an appointment?  Thank you.

## 2020-11-30 ENCOUNTER — Ambulatory Visit (INDEPENDENT_AMBULATORY_CARE_PROVIDER_SITE_OTHER): Payer: Medicare Other | Admitting: Family Medicine

## 2020-11-30 ENCOUNTER — Other Ambulatory Visit: Payer: Self-pay

## 2020-11-30 ENCOUNTER — Encounter: Payer: Self-pay | Admitting: Family Medicine

## 2020-11-30 VITALS — BP 157/84 | HR 71 | Ht 67.0 in | Wt 282.6 lb

## 2020-11-30 DIAGNOSIS — I10 Essential (primary) hypertension: Secondary | ICD-10-CM

## 2020-11-30 DIAGNOSIS — G894 Chronic pain syndrome: Secondary | ICD-10-CM | POA: Diagnosis not present

## 2020-11-30 DIAGNOSIS — Z6841 Body Mass Index (BMI) 40.0 and over, adult: Secondary | ICD-10-CM

## 2020-11-30 DIAGNOSIS — R7303 Prediabetes: Secondary | ICD-10-CM

## 2020-11-30 DIAGNOSIS — E7800 Pure hypercholesterolemia, unspecified: Secondary | ICD-10-CM

## 2020-11-30 NOTE — Progress Notes (Signed)
Subjective:    Patient ID: Megan Perez, female    DOB: 17-Sep-1946, 75 y.o.   MRN: [id number]  Megan Perez is a 75 y.o. female presenting on 11/30/2020 for Hypertension, Arthritis, and Dizziness   HPI   Chronic Lymphedema / Bilateral LE Edema Chronic problem, chart review since 2018+ prior PCP managing her edema. In 2018was referred to [facility] - Cardiology Dr Adrian Blackwater had stress test and cardiac work-up that was unremarkable.Also had ECHO that showed Grade 1 diastolic dysfunction otherwise no cause of swelling. Renal function had been stable  Followed by Vascular, Dr Wyn Quaker [facility], last visit 04/09/20, they ordered Lymphedema pump, awaiting on this. Admits persistent ankle edema, significantly, Left worse than Right. Has had prior surgical on Left ankle years ago. OFF of Furosemide still. Doing well. Denies any chest pain, dyspnea at rest, coughing wheezing, arm pain, chest pressure, near syncope or lightheadedness  Chronic Low Back / Hip Pain, with radiating sciatica down Right leg to knee Followed by Dr Laban Emperor S/p prior radiofrequency ablation, had intolerance to 2nd dose. Recent medication updates, up to Tizanidine 4mg  TID and pregabalin lyrica 100mg  nightly. She now has concern side effects Dizziness in AM, seems triggered by Pregabalin now up at 100mg  dosing. She said was not having dizziness before when on lower dose. She has concerns with cost of going to pain management for specialty visit and asks about if I can manage these medication rx  CHRONIC HTN: Reports elevated BP today due to stress, close friend passed. Current Meds - Lisinopril 20mg  daily   Reports good compliance, took meds today. Tolerating well, w/o complaints. Denies CP, dyspnea, HA, edema, dizziness / lightheadedness   PMH Insomnia, taking tizanidine, pregabalin at bedtime helps her sleep  HYPERLIPIDEMIA: Drug Induced Myopathy  - Reports no concerns. Last lipid panel 05/2019, mostly  normal still mild elevated LDL No longer on statin due to myalgia side effect atorvastatin 20mg    Depression screen [facility] 2/9 09/14/2020 06/29/2020 05/31/2020  Decreased Interest 0 0 0  Down, Depressed, Hopeless 0 0 0  PHQ - 2 Score 0 0 0  Altered sleeping - - -  Tired, decreased energy - - -  Change in appetite - - -  Feeling bad or failure about yourself  - - -  Trouble concentrating - - -  Moving slowly or fidgety/restless - - -  Suicidal thoughts - - -  PHQ-9 Score - - -    Social History   Tobacco Use  . Smoking status: Never Smoker  . Smokeless tobacco: Never Used  Vaping Use  . Vaping Use: Never used  Substance Use Topics  . Alcohol use: No  . Drug use: No    Review of Systems Per HPI unless specifically indicated above     Objective:    BP (!) 157/84   Pulse 71   Ht 5\' 7"  (1.702 m)   Wt 282 lb 9.6 oz (128.2 kg)   SpO2 98%   BMI 44.26 kg/m   Wt Readings from Last 3 Encounters:  11/30/20 282 lb 9.6 oz (128.2 kg)  09/27/20 268 lb (121.6 kg)  09/14/20 280 lb (127 kg)    Physical Exam Vitals and nursing note reviewed.  Constitutional:      General: She is not in acute distress.    Appearance: She is well-developed. She is not diaphoretic.     Comments: Well-appearing, comfortable, cooperative  HENT:     Head: Normocephalic and atraumatic.  Eyes:  General:        Right eye: No discharge.        Left eye: No discharge.     Conjunctiva/sclera: Conjunctivae normal.  Cardiovascular:     Rate and Rhythm: Normal rate.  Pulmonary:     Effort: Pulmonary effort is normal.  Skin:    General: Skin is warm and dry.     Findings: No erythema or rash.  Neurological:     Mental Status: She is alert and oriented to person, place, and time.  Psychiatric:        Behavior: Behavior normal.     Comments: Well groomed, good eye contact, normal speech and thoughts       Results for orders placed or performed in visit on 06/02/20  TSH  Result Value Ref Range    TSH 2.20 0.40 - 4.50 mIU/L  Lipid panel  Result Value Ref Range   Cholesterol 204 (H) <200 mg/dL   HDL 63 > OR = 50 mg/dL   Triglycerides 76 <370 mg/dL   LDL Cholesterol (Calc) 124 (H) mg/dL (calc)   Total CHOL/HDL Ratio 3.2 <5.0 (calc)   Non-HDL Cholesterol (Calc) 141 (H) <130 mg/dL (calc)  COMPLETE METABOLIC PANEL WITH GFR  Result Value Ref Range   Glucose, Bld 91 65 - 99 mg/dL   BUN 13 7 - 25 mg/dL   Creat 4.88 8.91 - 6.94 mg/dL   GFR, Est Non African American 78 > OR = 60 mL/min/1.37m2   GFR, Est African American 90 > OR = 60 mL/min/1.38m2   BUN/Creatinine Ratio NOT APPLICABLE 6 - 22 (calc)   Sodium 144 135 - 146 mmol/L   Potassium 3.9 3.5 - 5.3 mmol/L   Chloride 107 98 - 110 mmol/L   CO2 27 20 - 32 mmol/L   Calcium 9.0 8.6 - 10.4 mg/dL   Total Protein 6.4 6.1 - 8.1 g/dL   Albumin 3.9 3.6 - 5.1 g/dL   Globulin 2.5 1.9 - 3.7 g/dL (calc)   AG Ratio 1.6 1.0 - 2.5 (calc)   Total Bilirubin 0.3 0.2 - 1.2 mg/dL   Alkaline phosphatase (APISO) 72 37 - 153 U/L   AST 11 10 - 35 U/L   ALT 10 6 - 29 U/L  CBC with Differential/Platelet  Result Value Ref Range   WBC 4.9 3.8 - 10.8 Thousand/uL   RBC 3.78 (L) 3.80 - 5.10 Million/uL   Hemoglobin 11.5 (L) 11.7 - 15.5 g/dL   HCT 50.3 88.8 - 28.0 %   MCV 93.4 80.0 - 100.0 fL   MCH 30.4 27.0 - 33.0 pg   MCHC 32.6 32.0 - 36.0 g/dL   RDW 03.4 91.7 - 91.5 %   Platelets 286 140 - 400 Thousand/uL   MPV 10.2 7.5 - 12.5 fL   Neutro Abs 2,445 1,500 - 7,800 cells/uL   Lymphs Abs 1,803 850 - 3,900 cells/uL   Absolute Monocytes 421 200 - 950 cells/uL   Eosinophils Absolute 172 15 - 500 cells/uL   Basophils Absolute 59 0 - 200 cells/uL   Neutrophils Relative % 49.9 %   Total Lymphocyte 36.8 %   Monocytes Relative 8.6 %   Eosinophils Relative 3.5 %   Basophils Relative 1.2 %  Hemoglobin A1c  Result Value Ref Range   Hgb A1c MFr Bld 6.0 (H) <5.7 % of total Hgb   Mean Plasma Glucose 126 (calc)   eAG (mmol/L) 7.0 (calc)      Assessment &  Plan:   Problem List  Items Addressed This Visit    Pure hypercholesterolemia   Prediabetes    Continue improving lifestyle A1c 6.0      Morbid obesity with BMI of 40.0-44.9, adult (HCC)   Essential hypertension - Primary    Previously controlled HTN / elevated BP today with acute stressor lost close friend, recent bad news - Home BP readings normal  Morbid Obesity, Lymphedema   Plan:  1. Continue current BP regimen Lisinopril 20mg  daily 2. Encourage improved lifestyle - low sodium diet, regular exercise 3. Continue monitor BP outside office, bring readings to next visit, if persistently >140/90 or new symptoms notify office sooner      Chronic pain syndrome (Chronic)      Followed by [facility] Dr [facility] S/p prior radiofrequency ablation Discussed her current pain management now and she has side effects believe from the Pregabalin dizziness at 100mg  now since dose increase. She asks about dose reduction, and not improved as much for pain control on this dosage, also with Tizanidine unsure which was causing possible side effect - She request if she can f/u with me for current med management due to higher cost of specialty visit - I will reach out to Dr Laban Emperor on this issue, and if it is okay with him I can lower her Pregabalin dosage and follow-up with her for med management. If she needs further procedural intervention again or ultimately needs new medication options, hopefully she would be able to return to him for further management.   No orders of the defined types were placed in this encounter.    Follow up plan: Return in about 3 months (around 03/02/2021) for 3 month follow-up HTN, Pain medication refill.  Laban Emperor, DO [facility] 11/30/2020, 10:48 AM

## 2020-11-30 NOTE — Patient Instructions (Addendum)
Thank you for coming to the office today.  You may dial back the pain medications. If you want to reduce Tizanidine from 4 to 2mg  that is fine. Try this if you like.  I will send a message to Dr to ask if we can adjust the med and continue med management only here, and if needed return to him for procedures or other options, due to cost of visits for now.  Likely med side effect with the dizziness.   Please schedule a Follow-up Appointment to: Return in about 3 months (around 03/02/2021) for 3 month follow-up HTN, Pain medication refill.  If you have any other questions or concerns, please feel free to call the office or send a message through MyChart. You may also schedule an earlier appointment if necessary.  Additionally, you may be receiving a survey about your experience at our office within a few days to 1 week by e-mail or mail. We value your feedback.  03/04/2021, DO [facility], [facility]

## 2020-11-30 NOTE — Assessment & Plan Note (Signed)
Continue improving lifestyle A1c 6.0

## 2020-11-30 NOTE — Assessment & Plan Note (Signed)
Previously controlled HTN / elevated BP today with acute stressor lost close friend, recent bad news - Home BP readings normal  Morbid Obesity, Lymphedema   Plan:  1. Continue current BP regimen Lisinopril 20mg  daily 2. Encourage improved lifestyle - low sodium diet, regular exercise 3. Continue monitor BP outside office, bring readings to next visit, if persistently >140/90 or new symptoms notify office sooner

## 2020-12-10 ENCOUNTER — Ambulatory Visit: Payer: Self-pay | Admitting: *Deleted

## 2020-12-10 NOTE — Telephone Encounter (Signed)
Pt called stating that she recently had an appt with PCP. She states that at that appt she was advised to reduce the amount of pregablin that she was taking due to her side effects. She states that this is turn has caused her to become very emotional and not act like herself. Pt is requesting to have advice. Please advise.   Patient reports she is using medication for pain- patient reports she is not in pain. Patient states that she has been emotional for 3 days.Patient is doing every other day dosing with Pregablin for a little over 2 weeks- patient reports she been doing better with dizziness. Patient states her emotional changes( crying for no reason) started 3 days ago- she crys for no reason- patient states she is not depressed or suicidal. Appointment scheduled for Monday am- patient advised to seek help at ED if she feels worse over the weekend. Patient understands. Note sent for PCP review.  Patient states her medical alert alarm is still on order- she has not heard from the insurance company.  Reason for Disposition . [1] New or changed psychiatric medications > 2 weeks ago AND [2] not feeling any better  Answer Assessment - Initial Assessment Questions 1. CONCERN: "What happened that made you call today?"     3 days emotional 2. DEPRESSION SYMPTOM SCREENING: "How are you feeling overall?" (e.g., decreased energy, increased sleeping or difficulty sleeping, difficulty concentrating, feelings of sadness, guilt, hopelessness, or worthlessness)     Patient states she is normally happy person- last 3 days she has been emotional. 3. RISK OF HARM - SUICIDAL IDEATION:  "Do you ever have thoughts of hurting or killing yourself?"  (e.g., yes, no, no but preoccupation with thoughts about death)   - INTENT:  "Do you have thoughts of hurting or killing yourself right NOW?" (e.g., yes, no, N/A)   - PLAN: "Do you have a specific plan for how you would do this?" (e.g., gun, knife, overdose, no plan, N/A)      No-no plans 4. RISK OF HARM - HOMICIDAL IDEATION:  "Do you ever have thoughts of hurting or killing someone else?"  (e.g., yes, no, no but preoccupation with thoughts about death)   - INTENT:  "Do you have thoughts of hurting or killing someone right NOW?" (e.g., yes, no, N/A)   - PLAN: "Do you have a specific plan for how you would do this?" (e.g., gun, knife, no plan, N/A)      No- no plans 5. FUNCTIONAL IMPAIRMENT: "How have things been going for you overall? Have you had more difficulty than usual doing your normal daily activities?"  (e.g., better, same, worse; self-care, school, work, interactions)     Patient reports she has been fine and doing normal activities 6. SUPPORT: "Who is with you now?" "Who do you live with?" "Do you have family or friends who you can talk to?"      Patient lives alone, she does have support with church and daughter lives out of town- but talks to her on the phone 7. THERAPIST: "Do you have a counselor or therapist? Name?"     When husband passed- 2014, no problems since then 8. STRESSORS: "Has there been any new stress or recent changes in your life?"     no 9. ALCOHOL USE OR SUBSTANCE USE (DRUG USE): "Do you drink alcohol or use any illegal drugs?"     no 10. OTHER: "Do you have any other physical symptoms right now?" (e.g., fever)  Feels dizzy when stand up- patient walks fast- if she moves quickly- she gets dizzy 11. PREGNANCY: "Is there any chance you are pregnant?" "When was your last menstrual period?"       n/a  Protocols used: DEPRESSION-A-AH

## 2020-12-13 ENCOUNTER — Other Ambulatory Visit: Payer: Self-pay

## 2020-12-13 ENCOUNTER — Encounter: Payer: Self-pay | Admitting: Family Medicine

## 2020-12-13 ENCOUNTER — Ambulatory Visit (INDEPENDENT_AMBULATORY_CARE_PROVIDER_SITE_OTHER): Payer: Medicare Other | Admitting: Family Medicine

## 2020-12-13 DIAGNOSIS — G8929 Other chronic pain: Secondary | ICD-10-CM | POA: Diagnosis not present

## 2020-12-13 DIAGNOSIS — M792 Neuralgia and neuritis, unspecified: Secondary | ICD-10-CM | POA: Diagnosis not present

## 2020-12-13 MED ORDER — PREGABALIN 50 MG PO CAPS
50.0000 mg | ORAL_CAPSULE | Freq: Every day | ORAL | 0 refills | Status: DC
Start: 2020-12-13 — End: 2021-03-02

## 2020-12-13 NOTE — Progress Notes (Signed)
Subjective:    Patient ID: Megan Perez, female    DOB: 10-07-1945, 75 y.o.   MRN: [id number]  Megan Perez is a 75 y.o. female presenting on 12/13/2020 for Medication Problem (Pt complains of feeling very emotional, dizzy and fatigue. She state on Sunday she got to crying uncontrollable and it was hard to stop. He also complains of heart pounding and that she can even hear it when she lying down. The symptoms started  x 4 days after starting the tapering of the Lyrica. She said she had these same symptoms when they stopped her Gapabentin.  The pt seems to think th Lyrica was causing her blurry vision and since she coming off of the medication her vision is improvement. )   HPI   Follow-up Chronic Pain Last visit 11/30/20 with me, she had concerns with side effects of dizziness and fatigue, and asking to taper down on her medications.  She has tapered down on Lyrica 100mg  every other night. She was skipping one night. She thinks the Pregabalin was causing blurry eye vision.  Recently this past week she had emotional rollercoaster, she had unprompted crying spells, without any specific trigger. She had same symptoms when came off of Gabapentin  She has also reduced Tizanidine from 4 to 2mg  now. With improvement.  She is not endorsing major depression, but does admit the crying spells. She is hopeful that if on lower dose of medicine she will feel better.   Depression screen [facility] 2/9 09/14/2020 06/29/2020 05/31/2020  Decreased Interest 0 0 0  Down, Depressed, Hopeless 0 0 0  PHQ - 2 Score 0 0 0  Altered sleeping - - -  Tired, decreased energy - - -  Change in appetite - - -  Feeling bad or failure about yourself  - - -  Trouble concentrating - - -  Moving slowly or fidgety/restless - - -  Suicidal thoughts - - -  PHQ-9 Score - - -    Social History   Tobacco Use  . Smoking status: Never Smoker  . Smokeless tobacco: Never Used  Vaping Use  . Vaping Use: Never used  Substance  Use Topics  . Alcohol use: No  . Drug use: No    Review of Systems Per HPI unless specifically indicated above     Objective:    BP 134/64 (BP Location: Left Arm, Patient Position: Sitting, Cuff Size: Large)   Pulse 89   Temp (!) 97.1 F (36.2 C) (Temporal)   Resp 18   Ht 5\' 7"  (1.702 m)   Wt 279 lb (126.6 kg)   SpO2 100%   BMI 43.70 kg/m   Wt Readings from Last 3 Encounters:  12/13/20 279 lb (126.6 kg)  11/30/20 282 lb 9.6 oz (128.2 kg)  09/27/20 268 lb (121.6 kg)    Physical Exam Vitals and nursing note reviewed.  Constitutional:      General: She is not in acute distress.    Appearance: She is well-developed. She is not diaphoretic.     Comments: Well-appearing, comfortable, cooperative  HENT:     Head: Normocephalic and atraumatic.  Eyes:     General:        Right eye: No discharge.        Left eye: No discharge.     Conjunctiva/sclera: Conjunctivae normal.  Cardiovascular:     Rate and Rhythm: Normal rate.  Pulmonary:     Effort: Pulmonary effort is normal.  Skin:  General: Skin is warm and dry.     Findings: No erythema or rash.  Neurological:     Mental Status: She is alert and oriented to person, place, and time.  Psychiatric:        Behavior: Behavior normal.     Comments: Well groomed, good eye contact, normal speech and thoughts    Results for orders placed or performed in visit on 06/02/20  TSH  Result Value Ref Range   TSH 2.20 0.40 - 4.50 mIU/L  Lipid panel  Result Value Ref Range   Cholesterol 204 (H) <200 mg/dL   HDL 63 > OR = 50 mg/dL   Triglycerides 76 <258 mg/dL   LDL Cholesterol (Calc) 124 (H) mg/dL (calc)   Total CHOL/HDL Ratio 3.2 <5.0 (calc)   Non-HDL Cholesterol (Calc) 141 (H) <130 mg/dL (calc)  COMPLETE METABOLIC PANEL WITH GFR  Result Value Ref Range   Glucose, Bld 91 65 - 99 mg/dL   BUN 13 7 - 25 mg/dL   Creat 5.27 7.82 - 4.23 mg/dL   GFR, Est Non African American 78 > OR = 60 mL/min/1.58m2   GFR, Est African American  90 > OR = 60 mL/min/1.45m2   BUN/Creatinine Ratio NOT APPLICABLE 6 - 22 (calc)   Sodium 144 135 - 146 mmol/L   Potassium 3.9 3.5 - 5.3 mmol/L   Chloride 107 98 - 110 mmol/L   CO2 27 20 - 32 mmol/L   Calcium 9.0 8.6 - 10.4 mg/dL   Total Protein 6.4 6.1 - 8.1 g/dL   Albumin 3.9 3.6 - 5.1 g/dL   Globulin 2.5 1.9 - 3.7 g/dL (calc)   AG Ratio 1.6 1.0 - 2.5 (calc)   Total Bilirubin 0.3 0.2 - 1.2 mg/dL   Alkaline phosphatase (APISO) 72 37 - 153 U/L   AST 11 10 - 35 U/L   ALT 10 6 - 29 U/L  CBC with Differential/Platelet  Result Value Ref Range   WBC 4.9 3.8 - 10.8 Thousand/uL   RBC 3.78 (L) 3.80 - 5.10 Million/uL   Hemoglobin 11.5 (L) 11.7 - 15.5 g/dL   HCT 53.6 14.4 - 31.5 %   MCV 93.4 80.0 - 100.0 fL   MCH 30.4 27.0 - 33.0 pg   MCHC 32.6 32.0 - 36.0 g/dL   RDW 40.0 86.7 - 61.9 %   Platelets 286 140 - 400 Thousand/uL   MPV 10.2 7.5 - 12.5 fL   Neutro Abs 2,445 1,500 - 7,800 cells/uL   Lymphs Abs 1,803 850 - 3,900 cells/uL   Absolute Monocytes 421 200 - 950 cells/uL   Eosinophils Absolute 172 15 - 500 cells/uL   Basophils Absolute 59 0 - 200 cells/uL   Neutrophils Relative % 49.9 %   Total Lymphocyte 36.8 %   Monocytes Relative 8.6 %   Eosinophils Relative 3.5 %   Basophils Relative 1.2 %  Hemoglobin A1c  Result Value Ref Range   Hgb A1c MFr Bld 6.0 (H) <5.7 % of total Hgb   Mean Plasma Glucose 126 (calc)   eAG (mmol/L) 7.0 (calc)      Assessment & Plan:   Problem List Items Addressed This Visit    Neurogenic pain (Chronic)   Relevant Medications   pregabalin (LYRICA) 50 MG capsule   Chronic peripheral neuropathic pain (Chronic)   Relevant Medications   pregabalin (LYRICA) 50 MG capsule      Followed by [facility] Dr Laban Emperor S/p prior radiofrequency ablation  Discussed her current pain management  now and she has side effects believe from the Pregabalin dizziness at 100mg  now since dose increase recently. In past 2 weeks she had started to taper down and  lower dose but she is taking 100mg  one day and 0 mg next day alternating, she had side effect of mood instability and crying spells, similar to when she tried to come off Gabapentin before.  Today I advised her that instead of waiting until upcoming apt with [facility] on 12/22/20 I would be willing to help lower her dosage of Lyrica down to 50mg  - we considered 75 but will proceed with 50mg  dosage, she can take daily for 1-2 week then taper again at 50mg  one day and HOLD med next day alternating, then every 3rd day and eventually off. She may also notify [facility] for a rx Pregabalin (lyrica) 25mg  if need instead.  I will forward chart to Dr 02/21/21 office as well. She should notify them of this plan and re-schedule appropriate follow-up.   Meds ordered this encounter  Medications  . pregabalin (LYRICA) 50 MG capsule    Sig: Take 1 capsule (50 mg total) by mouth at bedtime.    Dispense:  30 capsule    Refill:  0    Dosage reduction, from 100 to 50mg . Ordered by PCP, I have notified her pain management    Follow up plan: Return if symptoms worsen or fail to improve.  , DO [facility] 12/13/2020, 11:26 AM

## 2020-12-13 NOTE — Patient Instructions (Addendum)
Thank you for coming to the office today.  Reduced dose on Pregabalin to 50mg  instead of 100mg . Probably too quickly decreased dosage from 100 to 0 every other day.  Start with the 50mg  dosage EVERY DAY for about 1 to 2 week, then if ready to taper down further, you can take one 50mg  every OTHER DAY, after another 1-2 week, you can gradually discontinue it again. You can try every 3 days if you prefer to slowly taper down and eventually off.  If still too much too quickly we can order the 25mg  again at lower dose if you need. Let me know - call or message.   Please schedule a Follow-up Appointment to: Return if symptoms worsen or fail to improve.  If you have any other questions or concerns, please feel free to call the office or send a message through MyChart. You may also schedule an earlier appointment if necessary.  Additionally, you may be receiving a survey about your experience at our office within a few days to 1 week by e-mail or mail. We value your feedback.  , DO [facility], 

## 2020-12-22 ENCOUNTER — Encounter: Payer: Medicare Other | Admitting: Pain Medicine

## 2020-12-28 ENCOUNTER — Ambulatory Visit (INDEPENDENT_AMBULATORY_CARE_PROVIDER_SITE_OTHER): Payer: Medicare HMO | Admitting: Vascular Surgery

## 2021-01-11 ENCOUNTER — Encounter (INDEPENDENT_AMBULATORY_CARE_PROVIDER_SITE_OTHER): Payer: Self-pay | Admitting: Vascular Surgery

## 2021-01-11 ENCOUNTER — Ambulatory Visit (INDEPENDENT_AMBULATORY_CARE_PROVIDER_SITE_OTHER): Payer: Medicare Other | Admitting: Vascular Surgery

## 2021-01-11 ENCOUNTER — Other Ambulatory Visit: Payer: Self-pay

## 2021-01-11 VITALS — BP 147/83 | HR 71 | Ht 66.0 in | Wt 279.0 lb

## 2021-01-11 DIAGNOSIS — I890 Lymphedema, not elsewhere classified: Secondary | ICD-10-CM | POA: Diagnosis not present

## 2021-01-11 NOTE — Progress Notes (Signed)
MRN : [id number]  Megan Perez is a 75 y.o. (27-Aug-1946) female who presents with chief complaint of  Chief Complaint  Patient presents with  . Follow-up    6 mo no studies  .  History of Present Illness: Patient returns today in follow up of her leg swelling and lymphedema.  She has been diligently using her lymphedema pump and this has seemed to make a benefit in her leg swelling in general.  When she is up on her feet for long periods of time her legs do swell more and this has been noticeable.  No new ulcerations or infections.  She is also coming off of her Lyrica and this seems to be improving her swelling as well.  Current Outpatient Medications  Medication Sig Dispense Refill  . acetaminophen (TYLENOL) 500 MG tablet Take 1,300 mg by mouth every 6 (six) hours as needed.     [name] albuterol (VENTOLIN HFA) 108 (90 Base) MCG/ACT inhaler Inhale 2 puffs into the lungs every 4 (four) hours as needed for wheezing or shortness of breath (cough). 1 each 2  . azelastine (OPTIVAR) 0.05 % ophthalmic solution Place 2 drops into both eyes daily as needed.    . diclofenac sodium (VOLTAREN) 1 % GEL APPLY 2 GRAMS TOPICALLY FOUR TIMES DAILY AS NEEDED FOR MODERATE PAIN OF BACK AND KNEES 100 g 1  . lisinopril (ZESTRIL) 20 MG tablet Take 1 tablet (20 mg total) by mouth daily. 90 tablet 3  . pantoprazole (PROTONIX) 20 MG tablet Take 1 tablet (20 mg total) by mouth daily before breakfast. 90 tablet 0  . pregabalin (LYRICA) 50 MG capsule Take 1 capsule (50 mg total) by mouth at bedtime. 30 capsule 0  . RESTASIS 0.05 % ophthalmic emulsion Place 1 drop into both eyes in the morning and at bedtime.    . sucralfate (CARAFATE) 1 g tablet TAKE 1 TABLET(1 GRAM) BY MOUTH FOUR TIMES DAILY AT BEDTIME WITH MEALS AS NEEDED 60 tablet 2  . tiZANidine (ZANAFLEX) 4 MG tablet Take 1 tablet (4 mg total) by mouth at bedtime. (Patient taking differently: Take 2 mg by mouth at bedtime.) 30 tablet 2  . calcium carbonate (CALCIUM  600) 600 MG TABS tablet Take 1 tablet (600 mg total) by mouth daily. 30 tablet 2   No current facility-administered medications for this visit.    Past Medical History:  Diagnosis Date  . Anxiety   . Asthma    reactive airway and shortness of breath w/ exertion in hot temperatures  . Bronchitis   . GERD (gastroesophageal reflux disease)   . Hypertension   . Morbid obesity with BMI of 40.0-44.9, adult (HCC) 04/08/2012  . OA (osteoarthritis)    knee  . Osteoporosis   . Prediabetes   . Sinus infection    10/2018  . Venous (peripheral) insufficiency 08/19/2010   Qualifier: Diagnosis of  By: [name], [place]      Past Surgical History:  Procedure Laterality Date  . ABDOMINAL HYSTERECTOMY    . CARPAL TUNNEL RELEASE Right   . CHOLECYSTECTOMY    . COLONOSCOPY WITH PROPOFOL N/A 06/19/2017   Procedure: COLONOSCOPY WITH PROPOFOL;  Surgeon: [name], MD;  Location: [facility] ENDOSCOPY;  Service: Endoscopy;  Laterality: N/A;  . HEEL SPUR SURGERY    . REPLACEMENT TOTAL KNEE     left  . REPLACEMENT TOTAL KNEE Right   . SHOULDER ARTHROSCOPY WITH OPEN ROTATOR CUFF REPAIR Right 09/15/2015   Procedure: SHOULDER ARTHROSCOPY WITH OPEN  ROTATOR CUFF REPAIR;  Surgeon: [name], MD;  Location: [facility] ORS;  Service: Orthopedics;  Laterality: Right;  . SHOULDER SURGERY Left      Social History   Tobacco Use  . Smoking status: Never Smoker  . Smokeless tobacco: Never Used  Vaping Use  . Vaping Use: Never used  Substance Use Topics  . Alcohol use: No  . Drug use: No      Family History  Adopted: Yes  Problem Relation Age of Onset  . Heart disease Father   . Subarachnoid hemorrhage Mother   . Hypertension Mother   . Subarachnoid hemorrhage Sister   . Hypertension Sister   . Hypertension Sister   . Hypertension Sister   . Healthy Sister   . Breast cancer Neg Hx      Allergies  Allergen Reactions  . Baclofen     Makes her feel Drunk  . Meclizine   . Other     Other  reaction(s): Other (See Comments) Patient notes there is a pain medication that causes hot flushing sensation in her mouth but cannot recall its name.  [name] Penicillins   . Prednisone Nausea And Vomiting  . Prevacid [Lansoprazole]     Causes dizziness  . Gabapentin Hives and Itching  . Oxycodone Rash    REVIEW OF SYSTEMS(Negative unless checked)  Constitutional: [] ???Weight loss [] ???Fever [] ???Chills Cardiac: [] ???Chest pain [] ???Chest pressure [] ???Palpitations [] ???Shortness of breath when laying flat [] ???Shortness of breath at rest [] ???Shortness of breath with exertion. Vascular: [x] ???Pain in legs with walking [x] ???Pain in legs at rest [] ???Pain in legs when laying flat [] ???Claudication [] ???Pain in feet when walking [] ???Pain in feet at rest [] ???Pain in feet when laying flat [] ???History of DVT [] ???Phlebitis [x] ???Swelling in legs [] ???Varicose veins [] ???Non-healing ulcers Pulmonary: [] ???Uses home oxygen [] ???Productive cough [] ???Hemoptysis [] ???Wheeze [] ???COPD [] ???Asthma Neurologic: [] ???Dizziness [] ???Blackouts [] ???Seizures [] ???History of stroke [] ???History of TIA [] ???Aphasia [] ???Temporary blindness [] ???Dysphagia [] ???Weakness or numbness in arms [x] ???Weakness or numbness in legs Musculoskeletal: [x] ???Arthritis [] ???Joint swelling [x] ???Joint pain [] ???Low back pain Hematologic: [] ???Easy bruising [] ???Easy bleeding [] ???Hypercoagulable state [] ???Anemic [] ???Hepatitis Gastrointestinal: [] ???Blood in stool [] ???Vomiting blood [] ???Gastroesophageal reflux/heartburn [] ???Abdominal pain Genitourinary: [] ???Chronic kidney disease [] ???Difficult urination [] ???Frequent urination [] ???Burning with urination [] ???Hematuria Skin: [] ???Rashes [] ???Ulcers [] ???Wounds Psychological: [] ???History of anxiety [] ???History of major depression.  Physical Examination  BP (!) 147/83    Pulse 71   Ht 5\' 6"  (1.676 m)   Wt 279 lb (126.6 kg)   BMI 45.03 kg/m  Gen:  WD/WN, NAD Head: [place]/AT, No temporalis wasting. Ear/Nose/Throat: Hearing grossly intact, nares w/o erythema or drainage Eyes: Conjunctiva clear. Sclera non-icteric Neck: Supple.  Trachea midline Pulmonary:  Good air movement, no use of accessory muscles.  Cardiac: RRR, no JVD Vascular:  Vessel Right Left  Radial Palpable Palpable                       Musculoskeletal: M/S 5/5 throughout.  No deformity or atrophy.  1-2+ bilateral lower extremity edema. Neurologic: Sensation grossly intact in extremities.  Symmetrical.  Speech is fluent.  Psychiatric: Judgment intact, Mood & affect appropriate for pt's clinical situation. Dermatologic: No rashes or ulcers noted.  No cellulitis or open wounds.       Labs No results found for this or any previous visit (from the past 2160 hour(s)).  Radiology No results found.  Assessment/Plan Essential hypertension blood pressure control important in reducing the progression of atherosclerotic disease. On appropriate oral medications.  Lymphedema of both lower extremities The lymphedema pump has  helped her symptoms and she is overall doing reasonably well. No changes. Recheck in one year    Festus Barren, MD  01/11/2021 10:11 AM    This note was created with Dragon medical transcription system.  Any errors from dictation are purely unintentional

## 2021-01-11 NOTE — Assessment & Plan Note (Signed)
The lymphedema pump has helped her symptoms and she is overall doing reasonably well. No changes. Recheck in one year

## 2021-01-19 ENCOUNTER — Other Ambulatory Visit: Payer: Self-pay

## 2021-01-19 DIAGNOSIS — K219 Gastro-esophageal reflux disease without esophagitis: Secondary | ICD-10-CM

## 2021-01-19 DIAGNOSIS — I10 Essential (primary) hypertension: Secondary | ICD-10-CM

## 2021-01-19 MED ORDER — PANTOPRAZOLE SODIUM 20 MG PO TBEC: 20.0000 mg | DELAYED_RELEASE_TABLET | Freq: Every day | ORAL | 0 refills | Status: DC

## 2021-01-19 MED ORDER — LISINOPRIL 20 MG PO TABS: 20.0000 mg | ORAL_TABLET | Freq: Every day | ORAL | 3 refills | Status: DC

## 2021-02-08 ENCOUNTER — Other Ambulatory Visit: Payer: Self-pay

## 2021-02-08 DIAGNOSIS — K219 Gastro-esophageal reflux disease without esophagitis: Secondary | ICD-10-CM

## 2021-02-08 DIAGNOSIS — R1084 Generalized abdominal pain: Secondary | ICD-10-CM

## 2021-02-08 MED ORDER — SUCRALFATE 1 G PO TABS: ORAL_TABLET | ORAL | 2 refills | Status: DC

## 2021-03-02 ENCOUNTER — Other Ambulatory Visit: Payer: Self-pay | Admitting: Family Medicine

## 2021-03-02 ENCOUNTER — Encounter: Payer: Self-pay | Admitting: Family Medicine

## 2021-03-02 ENCOUNTER — Ambulatory Visit (INDEPENDENT_AMBULATORY_CARE_PROVIDER_SITE_OTHER): Payer: Medicare Other | Admitting: Family Medicine

## 2021-03-02 ENCOUNTER — Other Ambulatory Visit: Payer: Self-pay

## 2021-03-02 VITALS — BP 138/80 | HR 67 | Ht 66.0 in | Wt 281.6 lb

## 2021-03-02 DIAGNOSIS — I890 Lymphedema, not elsewhere classified: Secondary | ICD-10-CM | POA: Diagnosis not present

## 2021-03-02 DIAGNOSIS — K219 Gastro-esophageal reflux disease without esophagitis: Secondary | ICD-10-CM | POA: Diagnosis not present

## 2021-03-02 DIAGNOSIS — I10 Essential (primary) hypertension: Secondary | ICD-10-CM

## 2021-03-02 DIAGNOSIS — M792 Neuralgia and neuritis, unspecified: Secondary | ICD-10-CM

## 2021-03-02 DIAGNOSIS — Z6841 Body Mass Index (BMI) 40.0 and over, adult: Secondary | ICD-10-CM

## 2021-03-02 DIAGNOSIS — F5101 Primary insomnia: Secondary | ICD-10-CM | POA: Diagnosis not present

## 2021-03-02 DIAGNOSIS — R7303 Prediabetes: Secondary | ICD-10-CM

## 2021-03-02 DIAGNOSIS — Z0000 Encounter for general adult medical examination without abnormal findings: Secondary | ICD-10-CM

## 2021-03-02 DIAGNOSIS — E7800 Pure hypercholesterolemia, unspecified: Secondary | ICD-10-CM

## 2021-03-02 MED ORDER — PANTOPRAZOLE SODIUM 20 MG PO TBEC: 20.0000 mg | DELAYED_RELEASE_TABLET | Freq: Every day | ORAL | 3 refills | Status: DC

## 2021-03-02 NOTE — Progress Notes (Signed)
Subjective:    Patient ID: Megan Perez, female    DOB: 05-Nov-1945, 75 y.o.   MRN: [id number]  Megan Perez is a 75 y.o. female presenting on 03/02/2021 for Hypertension   HPI   Insomnia Chronic problem, was improved while on Tizanidine / Lyrica doses however now off med and reduced she has had difficulty falling asleep, now tried Melatonin 10mg  OTC with some good results. But still has issues.  Chronic Lymphedema / Bilateral LE Edema Chronic problem, chart review since 2018+ prior PCP managing her edema. In 2018 was referred to [facility] - Cardiology Dr [facility] had stress test and cardiac work-up that was unremarkable. Also had ECHO that showed Grade 1 diastolic dysfunction otherwise no cause of swelling. Renal function had been stable   Followed by Vascular, Dr Adrian Blackwater [facility], last visit 12/2020, doing well on lymphedema pump Admits persistent ankle edema, significantly, Left worse than Right. Has had prior surgical on Left ankle years ago. OFF of Furosemide still. Doing well. Denies any chest pain, dyspnea at rest, coughing wheezing, arm pain, chest pressure, near syncope or lightheadedness   Chronic Low Back / Hip Pain, with radiating sciatica down Right leg to knee Followed by Dr 01/2021 S/p prior radiofrequency ablation, had intolerance to 2nd dose.  Follow-up Chronic Pain Last visit 11/2020 with me, she had concerns with side effects of dizziness and fatigue, and asking to taper down on her medications. - She was successfully tapered off Tizanidine and Lyrica/Pregabalin by 01/23/21. - She has felt much better and felt like herself again - Except had some difficulty sleeping, see below   CHRONIC HTN: Reports elevated BP today due to stress Current Meds - Lisinopril 20mg  daily   Reports good compliance, took meds today. Tolerating well, w/o complaints. Denies CP, dyspnea, HA, edema, dizziness / lightheadedness     Depression screen [facility] 2/9 03/02/2021 09/14/2020  06/29/2020  Decreased Interest 0 0 0  Down, Depressed, Hopeless 0 0 0  PHQ - 2 Score 0 0 0  Altered sleeping 3 - -  Tired, decreased energy 1 - -  Change in appetite 1 - -  Feeling bad or failure about yourself  0 - -  Trouble concentrating 0 - -  Moving slowly or fidgety/restless 0 - -  Suicidal thoughts 0 - -  PHQ-9 Score 5 - -  Difficult doing work/chores Somewhat difficult - -    Social History   Tobacco Use   Smoking status: Never   Smokeless tobacco: Never  Vaping Use   Vaping Use: Never used  Substance Use Topics   Alcohol use: No   Drug use: No    Review of Systems Per HPI unless specifically indicated above     Objective:    BP 138/80 (BP Location: Left Arm, Cuff Size: Normal)   Pulse 67   Ht 5\' 6"  (1.676 m)   Wt 281 lb 9.6 oz (127.7 kg)   SpO2 96%   BMI 45.45 kg/m   Wt Readings from Last 3 Encounters:  03/02/21 281 lb 9.6 oz (127.7 kg)  01/11/21 279 lb (126.6 kg)  12/13/20 279 lb (126.6 kg)    Physical Exam Vitals and nursing note reviewed.  Constitutional:      General: She is not in acute distress.    Appearance: She is well-developed. She is not diaphoretic.     Comments: Well-appearing, comfortable, cooperative  HENT:     Head: Normocephalic and atraumatic.  Eyes:  General:        Right eye: No discharge.        Left eye: No discharge.     Conjunctiva/sclera: Conjunctivae normal.  Neck:     Thyroid: No thyromegaly.  Cardiovascular:     Rate and Rhythm: Normal rate and regular rhythm.     Heart sounds: Normal heart sounds. No murmur heard. Pulmonary:     Effort: Pulmonary effort is normal. No respiratory distress.     Breath sounds: Normal breath sounds. No wheezing or rales.  Musculoskeletal:        General: Normal range of motion.     Cervical back: Normal range of motion and neck supple.     Right lower leg: Edema present.     Left lower leg: Edema (lymphedema bilateral stable) present.  Lymphadenopathy:     Cervical: No  cervical adenopathy.  Skin:    General: Skin is warm and dry.     Findings: No erythema or rash.  Neurological:     Mental Status: She is alert and oriented to person, place, and time.  Psychiatric:        Behavior: Behavior normal.     Comments: Well groomed, good eye contact, normal speech and thoughts     Results for orders placed or performed in visit on 06/02/20  TSH  Result Value Ref Range   TSH 2.20 0.40 - 4.50 mIU/L  Lipid panel  Result Value Ref Range   Cholesterol 204 (H) <200 mg/dL   HDL 63 > OR = 50 mg/dL   Triglycerides 76 <623 mg/dL   LDL Cholesterol (Calc) 124 (H) mg/dL (calc)   Total CHOL/HDL Ratio 3.2 <5.0 (calc)   Non-HDL Cholesterol (Calc) 141 (H) <130 mg/dL (calc)  COMPLETE METABOLIC PANEL WITH GFR  Result Value Ref Range   Glucose, Bld 91 65 - 99 mg/dL   BUN 13 7 - 25 mg/dL   Creat 7.62 8.31 - 5.17 mg/dL   GFR, Est Non African American 78 > OR = 60 mL/min/1.74m2   GFR, Est African American 90 > OR = 60 mL/min/1.71m2   BUN/Creatinine Ratio NOT APPLICABLE 6 - 22 (calc)   Sodium 144 135 - 146 mmol/L   Potassium 3.9 3.5 - 5.3 mmol/L   Chloride 107 98 - 110 mmol/L   CO2 27 20 - 32 mmol/L   Calcium 9.0 8.6 - 10.4 mg/dL   Total Protein 6.4 6.1 - 8.1 g/dL   Albumin 3.9 3.6 - 5.1 g/dL   Globulin 2.5 1.9 - 3.7 g/dL (calc)   AG Ratio 1.6 1.0 - 2.5 (calc)   Total Bilirubin 0.3 0.2 - 1.2 mg/dL   Alkaline phosphatase (APISO) 72 37 - 153 U/L   AST 11 10 - 35 U/L   ALT 10 6 - 29 U/L  CBC with Differential/Platelet  Result Value Ref Range   WBC 4.9 3.8 - 10.8 Thousand/uL   RBC 3.78 (L) 3.80 - 5.10 Million/uL   Hemoglobin 11.5 (L) 11.7 - 15.5 g/dL   HCT 61.6 07.3 - 71.0 %   MCV 93.4 80.0 - 100.0 fL   MCH 30.4 27.0 - 33.0 pg   MCHC 32.6 32.0 - 36.0 g/dL   RDW 62.6 94.8 - 54.6 %   Platelets 286 140 - 400 Thousand/uL   MPV 10.2 7.5 - 12.5 fL   Neutro Abs 2,445 1,500 - 7,800 cells/uL   Lymphs Abs 1,803 850 - 3,900 cells/uL   Absolute Monocytes 421 200 - 950  cells/uL  Eosinophils Absolute 172 15 - 500 cells/uL   Basophils Absolute 59 0 - 200 cells/uL   Neutrophils Relative % 49.9 %   Total Lymphocyte 36.8 %   Monocytes Relative 8.6 %   Eosinophils Relative 3.5 %   Basophils Relative 1.2 %  Hemoglobin A1c  Result Value Ref Range   Hgb A1c MFr Bld 6.0 (H) <5.7 % of total Hgb   Mean Plasma Glucose 126 (calc)   eAG (mmol/L) 7.0 (calc)      Assessment & Plan:   Problem List Items Addressed This Visit     Neurogenic pain (Chronic)    Stable chronic neurogenic pain Off Lyrica, Tizanidine Doing well Managing currently Not followed again by Dr Laban Emperor       Primary insomnia    Difficulty with sleep insomnia after coming off of Lyrica, Tizanidine. On Melatonin 10mg  now Counseling on lifestyle modification and future if we need to consider other meds can consider Trazodone as next option       Morbid obesity with BMI of 40.0-44.9, adult (HCC)    Abnormal wt Goal to improve lifestyle, continue to treat lymphedema       Lymphedema of both lower extremities    Chronic problem Bilateral LE On Lymphedema pump improvement Followed by Dr 07-01-1982 Vascular       GERD    Controlled, refill Pantoprazole       Relevant Medications   pantoprazole (PROTONIX) 20 MG tablet   Essential hypertension - Primary    Previously controlled HTN / elevated BP today improved on repeat check Home BP cuff had to be returned. Morbid Obesity, Lymphedema   Plan:  1. Continue current BP regimen Lisinopril 20mg  daily 2. Encourage improved lifestyle - low sodium diet, regular exercise 3. Continue monitor BP outside office, bring readings to next visit, if persistently >140/90 or new symptoms notify office sooner        Meds ordered this encounter  Medications   pantoprazole (PROTONIX) 20 MG tablet    Sig: Take 1 tablet (20 mg total) by mouth daily before breakfast.    Dispense:  90 tablet    Refill:  3      Follow up plan: Return in about  3 months (around 06/02/2021) for 3 month fasting lab only then 1 week later Annual Physical.  Future labs ordered for 05/27/21   06/04/2021, DO [facility] 03/02/2021, 9:17 AM

## 2021-03-02 NOTE — Assessment & Plan Note (Signed)
Chronic problem Bilateral LE On Lymphedema pump improvement Followed by Dr Wyn Quaker [facility]

## 2021-03-02 NOTE — Assessment & Plan Note (Signed)
Difficulty with sleep insomnia after coming off of Lyrica, Tizanidine. On Melatonin 10mg  now Counseling on lifestyle modification and future if we need to consider other meds can consider Trazodone as next option

## 2021-03-02 NOTE — Assessment & Plan Note (Signed)
Controlled, refill Pantoprazole

## 2021-03-02 NOTE — Patient Instructions (Signed)
Thank you for coming to the office today.  DUE for FASTING BLOOD WORK (no food or drink after midnight before the lab appointment, only water or coffee without cream/sugar on the morning of)  SCHEDULE "Lab Only" visit in the morning at the clinic for lab draw in 3 MONTHS   - Make sure Lab Only appointment is at about 1 week before your next appointment, so that results will be available  For Lab Results, once available within 2-3 days of blood draw, you can can log in to MyChart online to view your results and a brief explanation. Also, we can discuss results at next follow-up visit.   Please schedule a Follow-up Appointment to: No follow-ups on file.  If you have any other questions or concerns, please feel free to call the office or send a message through MyChart. You may also schedule an earlier appointment if necessary.  Additionally, you may be receiving a survey about your experience at our office within a few days to 1 week by e-mail or mail. We value your feedback.  Raidon Swanner, DO [facility], [facility] 

## 2021-03-02 NOTE — Assessment & Plan Note (Signed)
Previously controlled HTN / elevated BP today improved on repeat check Home BP cuff had to be returned. Morbid Obesity, Lymphedema   Plan:  1. Continue current BP regimen Lisinopril 20mg  daily 2. Encourage improved lifestyle - low sodium diet, regular exercise 3. Continue monitor BP outside office, bring readings to next visit, if persistently >140/90 or new symptoms notify office sooner

## 2021-03-02 NOTE — Assessment & Plan Note (Signed)
Abnormal wt Goal to improve lifestyle, continue to treat lymphedema

## 2021-03-02 NOTE — Assessment & Plan Note (Signed)
Stable chronic neurogenic pain Off Lyrica, Tizanidine Doing well Managing currently Not followed again by Dr Laban Emperor

## 2021-03-29 ENCOUNTER — Other Ambulatory Visit: Payer: Self-pay | Admitting: Family Medicine

## 2021-03-29 DIAGNOSIS — K219 Gastro-esophageal reflux disease without esophagitis: Secondary | ICD-10-CM

## 2021-03-29 DIAGNOSIS — R1084 Generalized abdominal pain: Secondary | ICD-10-CM

## 2021-05-27 ENCOUNTER — Other Ambulatory Visit: Payer: Medicare Other

## 2021-05-30 ENCOUNTER — Other Ambulatory Visit: Payer: Self-pay

## 2021-05-30 DIAGNOSIS — R7303 Prediabetes: Secondary | ICD-10-CM

## 2021-05-30 DIAGNOSIS — Z0000 Encounter for general adult medical examination without abnormal findings: Secondary | ICD-10-CM

## 2021-05-30 DIAGNOSIS — I10 Essential (primary) hypertension: Secondary | ICD-10-CM

## 2021-05-30 DIAGNOSIS — E7800 Pure hypercholesterolemia, unspecified: Secondary | ICD-10-CM

## 2021-05-30 DIAGNOSIS — I890 Lymphedema, not elsewhere classified: Secondary | ICD-10-CM

## 2021-05-31 ENCOUNTER — Other Ambulatory Visit: Payer: Medicare Other

## 2021-05-31 DIAGNOSIS — E7800 Pure hypercholesterolemia, unspecified: Secondary | ICD-10-CM | POA: Diagnosis not present

## 2021-05-31 DIAGNOSIS — R7303 Prediabetes: Secondary | ICD-10-CM | POA: Diagnosis not present

## 2021-05-31 DIAGNOSIS — I10 Essential (primary) hypertension: Secondary | ICD-10-CM | POA: Diagnosis not present

## 2021-06-01 LAB — COMPLETE METABOLIC PANEL WITH GFR
AG Ratio: 1.6 (calc) (ref 1.0–2.5)
ALT: 9 U/L (ref 6–29)
AST: 14 U/L (ref 10–35)
Albumin: 4 g/dL (ref 3.6–5.1)
Alkaline phosphatase (APISO): 68 U/L (ref 37–153)
BUN: 14 mg/dL (ref 7–25)
CO2: 31 mmol/L (ref 20–32)
Calcium: 9.3 mg/dL (ref 8.6–10.4)
Chloride: 104 mmol/L (ref 98–110)
Creat: 0.68 mg/dL (ref 0.60–1.00)
Globulin: 2.5 g/dL (calc) (ref 1.9–3.7)
Glucose, Bld: 103 mg/dL — ABNORMAL HIGH (ref 65–99)
Potassium: 4.1 mmol/L (ref 3.5–5.3)
Sodium: 142 mmol/L (ref 135–146)
Total Bilirubin: 0.3 mg/dL (ref 0.2–1.2)
Total Protein: 6.5 g/dL (ref 6.1–8.1)
eGFR: 90 mL/min/{1.73_m2} (ref >=60)

## 2021-06-01 LAB — LIPID PANEL
Cholesterol: 175 mg/dL (ref <200)
HDL: 56 mg/dL (ref >=50)
LDL Cholesterol (Calc): 103 mg/dL (calc) — ABNORMAL HIGH
Non-HDL Cholesterol (Calc): 119 mg/dL (calc) (ref <130)
Total CHOL/HDL Ratio: 3.1 (calc) (ref <5.0)
Triglycerides: 75 mg/dL (ref <150)

## 2021-06-01 LAB — CBC WITH DIFFERENTIAL/PLATELET
Absolute Monocytes: 300 cells/uL (ref 200–950)
Basophils Absolute: 60 cells/uL (ref 0–200)
Basophils Relative: 1.5 %
Eosinophils Absolute: 220 cells/uL (ref 15–500)
Eosinophils Relative: 5.5 %
HCT: 35.8 % (ref 35.0–45.0)
Hemoglobin: 11.5 g/dL — ABNORMAL LOW (ref 11.7–15.5)
Lymphs Abs: 1636 cells/uL (ref 850–3900)
MCH: 30.3 pg (ref 27.0–33.0)
MCHC: 32.1 g/dL (ref 32.0–36.0)
MCV: 94.5 fL (ref 80.0–100.0)
MPV: 10 fL (ref 7.5–12.5)
Monocytes Relative: 7.5 %
Neutro Abs: 1784 cells/uL (ref 1500–7800)
Neutrophils Relative %: 44.6 %
Platelets: 304 10*3/uL (ref 140–400)
RBC: 3.79 10*6/uL — ABNORMAL LOW (ref 3.80–5.10)
RDW: 13.1 % (ref 11.0–15.0)
Total Lymphocyte: 40.9 %
WBC: 4 10*3/uL (ref 3.8–10.8)

## 2021-06-01 LAB — HEMOGLOBIN A1C
Hgb A1c MFr Bld: 5.8 % of total Hgb — ABNORMAL HIGH (ref <5.7)
Mean Plasma Glucose: 120 mg/dL
eAG (mmol/L): 6.6 mmol/L

## 2021-06-01 LAB — TSH: TSH: 1.78 mIU/L (ref 0.40–4.50)

## 2021-06-02 ENCOUNTER — Other Ambulatory Visit: Payer: Self-pay

## 2021-06-03 ENCOUNTER — Encounter: Payer: Medicare Other | Admitting: Family Medicine

## 2021-06-07 ENCOUNTER — Other Ambulatory Visit: Payer: Self-pay

## 2021-06-07 ENCOUNTER — Ambulatory Visit (INDEPENDENT_AMBULATORY_CARE_PROVIDER_SITE_OTHER): Payer: Medicare Other | Admitting: Family Medicine

## 2021-06-07 ENCOUNTER — Encounter: Payer: Self-pay | Admitting: Family Medicine

## 2021-06-07 VITALS — BP 160/79 | HR 75 | Ht 66.0 in | Wt 277.6 lb

## 2021-06-07 DIAGNOSIS — Z23 Encounter for immunization: Secondary | ICD-10-CM

## 2021-06-07 DIAGNOSIS — I10 Essential (primary) hypertension: Secondary | ICD-10-CM | POA: Diagnosis not present

## 2021-06-07 DIAGNOSIS — R7303 Prediabetes: Secondary | ICD-10-CM

## 2021-06-07 DIAGNOSIS — E7800 Pure hypercholesterolemia, unspecified: Secondary | ICD-10-CM | POA: Diagnosis not present

## 2021-06-07 DIAGNOSIS — Z0000 Encounter for general adult medical examination without abnormal findings: Secondary | ICD-10-CM

## 2021-06-07 DIAGNOSIS — Z6841 Body Mass Index (BMI) 40.0 and over, adult: Secondary | ICD-10-CM

## 2021-06-07 NOTE — Progress Notes (Signed)
Subjective:    Patient ID: Megan Perez, female    DOB: April 01, 1946, 75 y.o.   MRN: [id number]  Megan Perez is a 75 y.o. female presenting on 06/07/2021 for Annual Exam   HPI  Here for Annual Physical and Lab Review.  Insomnia Chronic problem, was improved while on Tizanidine / Lyrica doses however now off med and reduced she has had difficulty falling asleep, now tried Melatonin 71m OTC with some good results. But still has issues.   Chronic Lymphedema / Bilateral LE Edema Chronic problem, chart review since 2018+ prior PCP managing her edema. [facility] Dr SNeoma Laminghad stress test and cardiac work-up that was unremarkable. Also had ECHO that showed Grade 1 diastolic dysfunction otherwise no cause of swelling. Renal function had been stable   Followed by Vascular, Dr DLucky CowboyAVVS, last visit 12/2020, doing well on lymphedema pump Admits persistent ankle edema, significantly, Left worse than Right. Has had prior surgical on Left ankle years ago. OFF of Furosemide still. Doing well. Denies any chest pain, dyspnea at rest, coughing wheezing, arm pain, chest pressure, near syncope or lightheadedness   Chronic Low Back / Hip Pain, with radiating sciatica down Right leg to knee Followed by Dr NDossie ArbourS/p prior radiofrequency ablation, had intolerance to 2nd dose.   Follow-up Chronic Pain Last visit 11/2020 with me, she had concerns with side effects of dizziness and fatigue, and asking to taper down on her medications. - She was successfully tapered off Tizanidine and Lyrica/Pregabalin by 01/23/21. She is doing well now on Tylenol 650 strength x 2 in AM and then may repeat  - She has felt much better and felt like herself again - Except had some difficulty sleeping, see below  PreDM IMproved A1c to 5.8 prior 5.9 to 6.2 in past IMproved lifestyle and wt loss  HYPERLIPIDEMIA: - Reports no concerns. Last lipid panel improved 05/2021   CHRONIC  HTN: Reports elevated BP today due to stress see below. Current Meds - Lisinopril 22mdaily   Reports good compliance, took meds today. Tolerating well, w/o complaints. Denies CP, dyspnea, HA, edema, dizziness / lightheadedness  Additional update:  Her nephew [id number] killed recently in MVC. She is working on processing this right now and has a good support system. She has some sad mood with grieving but she feels she is able to manage it at this time.   Health Maintenance: Due for Flu Shot, will receive today   Due for COVID updated booster, will go to pharmacy  [facility] 06/07/2021 03/02/2021 09/14/2020  Decreased Interest 0 0 0  Down, Depressed, Hopeless 0 0 0  PHQ - 2 Score 0 0 0  Altered sleeping 3 3 -  Tired, decreased energy 1 1 -  Change in appetite 0 1 -  Feeling bad or failure about yourself  0 0 -  Trouble concentrating 0 0 -  Moving slowly or fidgety/restless 0 0 -  Suicidal thoughts 0 0 -  PHQ-9 Score 4 5 -  Difficult doing work/chores Not difficult at all Somewhat difficult -    Past Medical History:  Diagnosis Date   Anxiety    Asthma    reactive airway and shortness of breath w/ exertion in hot temperatures   Bronchitis    GERD (gastroesophageal reflux disease)    Hypertension    Morbid obesity with BMI of 40.0-44.9, [place] [place]   OA (osteoarthritis)  knee   Osteoporosis    Prediabetes    Sinus infection    10/2018   Venous (peripheral) insufficiency 08/19/2010   Qualifier: Diagnosis of  By: Wynetta Emery MD, [place]     Past Surgical History:  Procedure Laterality Date   ABDOMINAL HYSTERECTOMY     CARPAL TUNNEL RELEASE Right    CHOLECYSTECTOMY     COLONOSCOPY WITH PROPOFOL N/A 06/19/2017   Procedure: COLONOSCOPY WITH PROPOFOL;  Surgeon: Lucilla Lame, MD;  Location: [facility] ENDOSCOPY;  Service: Endoscopy;  Laterality: N/A;   HEEL SPUR SURGERY     REPLACEMENT TOTAL KNEE     left   REPLACEMENT TOTAL KNEE Right    SHOULDER  ARTHROSCOPY WITH OPEN ROTATOR CUFF REPAIR Right 09/15/2015   Procedure: SHOULDER ARTHROSCOPY WITH OPEN ROTATOR CUFF REPAIR;  Surgeon: Earnestine Leys, MD;  Location: [facility] ORS;  Service: Orthopedics;  Laterality: Right;   SHOULDER SURGERY Left    Social History   Socioeconomic History   Marital status: Widowed    Spouse name: Not on file   Number of children: Not on file   Years of education: Not on file   Highest education level: Associate degree: academic program  Occupational History   Occupation: retired  Tobacco Use   Smoking status: Never   Smokeless tobacco: Never  Vaping Use   Vaping Use: Never used  Substance and Sexual Activity   Alcohol use: No   Drug use: No   Sexual activity: Yes    Birth control/protection: Surgical  Other Topics Concern   Not on file  Social History Narrative   Retired.  Lives alone   Social Determinants of Health   Financial Resource Strain: Low Risk    Difficulty of Paying Living Expenses: Not hard at all  Food Insecurity: No Food Insecurity   Worried About Charity fundraiser in the Last Year: Never true   Arboriculturist in the Last Year: Never true  Transportation Needs: No Transportation Needs   Lack of Transportation (Medical): No   Lack of Transportation (Non-Medical): No  Physical Activity: Inactive   Days of Exercise per Week: 0 days   Minutes of Exercise per Session: 0 min  Stress: No Stress Concern Present   Feeling of Stress : Not at all  Social Connections: Not on file  Intimate Partner Violence: Not on file   Family History  Adopted: Yes  Problem Relation Age of Onset   Heart disease Father    Subarachnoid hemorrhage Mother    Hypertension Mother    Subarachnoid hemorrhage Sister    Hypertension Sister    Hypertension Sister    Hypertension Sister    Healthy Sister    Breast cancer Neg Hx    Current Outpatient Medications on File Prior to Visit  Medication Sig   acetaminophen (TYLENOL) 500 MG tablet Take 1,300  mg by mouth every 6 (six) hours as needed.    albuterol (VENTOLIN HFA) 108 (90 Base) MCG/ACT inhaler Inhale 2 puffs into the lungs every 4 (four) hours as needed for wheezing or shortness of breath (cough).   azelastine (OPTIVAR) 0.05 % ophthalmic solution Place 2 drops into both eyes daily as needed.   diclofenac sodium (VOLTAREN) 1 % GEL APPLY 2 GRAMS TOPICALLY FOUR TIMES DAILY AS NEEDED FOR MODERATE PAIN OF BACK AND KNEES   lisinopril (ZESTRIL) 20 MG tablet Take 1 tablet (20 mg total) by mouth daily.   Melatonin 10 MG TABS Take 10 mg by mouth at bedtime.  pantoprazole (PROTONIX) 20 MG tablet Take 1 tablet (20 mg total) by mouth daily before breakfast.   RESTASIS 0.05 % ophthalmic emulsion Place 1 drop into both eyes in the morning and at bedtime.   sucralfate (CARAFATE) 1 g tablet TAKE 1 TABLET BY MOUTH FOUR TIMES DAILY(WITH MEALS AND AT BEDTIME AS NEEDED)   calcium carbonate (CALCIUM 600) 600 MG TABS tablet Take 1 tablet (600 mg total) by mouth daily.   No current facility-administered medications on file prior to visit.    Review of Systems  Constitutional:  Negative for activity change, appetite change, chills, diaphoresis, fatigue and fever.  HENT:  Negative for congestion and hearing loss.   Eyes:  Negative for visual disturbance.  Respiratory:  Negative for cough, chest tightness, shortness of breath and wheezing.   Cardiovascular:  Negative for chest pain, palpitations and leg swelling.  Gastrointestinal:  Negative for abdominal pain, constipation, diarrhea, nausea and vomiting.  Genitourinary:  Negative for dysuria, frequency and hematuria.  Musculoskeletal:  Negative for arthralgias and neck pain.  Skin:  Negative for rash.  Neurological:  Negative for dizziness, weakness, light-headedness, numbness and headaches.  Hematological:  Negative for adenopathy.  Psychiatric/Behavioral:  Negative for behavioral problems, dysphoric mood and sleep disturbance.   Per HPI unless  specifically indicated above      Objective:    BP (!) 160/79   Pulse 75   Ht _0  (1.676 m)   Wt 277 lb 9.6 oz (125.9 kg)   SpO2 94%   BMI 44.81 kg/m   Wt Readings from Last 3 Encounters:  06/07/21 277 lb 9.6 oz (125.9 kg)  03/02/21 281 lb 9.6 oz (127.7 kg)  01/11/21 279 lb (126.6 kg)    Physical Exam Vitals and nursing note reviewed.  Constitutional:      General: She is not in acute distress.    Appearance: She is well-developed. She is obese. She is not diaphoretic.     Comments: Well-appearing, comfortable, cooperative  HENT:     Head: Normocephalic and atraumatic.  Eyes:     General:        Right eye: No discharge.        Left eye: No discharge.     Conjunctiva/sclera: Conjunctivae normal.     Pupils: Pupils are equal, round, and reactive to light.  Neck:     Thyroid: No thyromegaly.  Cardiovascular:     Rate and Rhythm: Normal rate and regular rhythm.     Pulses: Normal pulses.     Heart sounds: Normal heart sounds. No murmur heard. Pulmonary:     Effort: Pulmonary effort is normal. No respiratory distress.     Breath sounds: Normal breath sounds. No wheezing or rales.  Abdominal:     General: Bowel sounds are normal. There is no distension.     Palpations: Abdomen is soft. There is no mass.     Tenderness: There is no abdominal tenderness.  Musculoskeletal:        General: No tenderness. Normal range of motion.     Cervical back: Normal range of motion and neck supple.     Right lower leg: Edema present.     Left lower leg: Edema present.     Comments: Upper / Lower Extremities: - Normal muscle tone, strength bilateral upper extremities 5/5, lower extremities 5/5  Lymphadenopathy:     Cervical: No cervical adenopathy.  Skin:    General: Skin is warm and dry.     Findings: No erythema or  rash.  Neurological:     Mental Status: She is alert and oriented to person, place, and time.     Comments: Distal sensation intact to light touch all extremities   Psychiatric:        Mood and Affect: Mood normal.        Behavior: Behavior normal.        Thought Content: Thought content normal.     Comments: Well groomed, good eye contact, normal speech and thoughts    Recent Labs    05/31/21 0926  HGBA1C 5.8*    Results for orders placed or performed in visit on 05/30/21  TSH  Result Value Ref Range   TSH 1.78 0.40 - 4.50 mIU/L  Hemoglobin A1c  Result Value Ref Range   Hgb A1c MFr Bld 5.8 (H) <5.7 % of total Hgb   Mean Plasma Glucose 120 mg/dL   eAG (mmol/L) 6.6 mmol/L  Lipid panel  Result Value Ref Range   Cholesterol 175 <200 mg/dL   HDL 56 > OR = 50 mg/dL   Triglycerides 75 <150 mg/dL   LDL Cholesterol (Calc) 103 (H) mg/dL (calc)   Total CHOL/HDL Ratio 3.1 <5.0 (calc)   Non-HDL Cholesterol (Calc) 119 <130 mg/dL (calc)  CBC with Differential/Platelet  Result Value Ref Range   WBC 4.0 3.8 - 10.8 Thousand/uL   RBC 3.79 (L) 3.80 - 5.10 Million/uL   Hemoglobin 11.5 (L) 11.7 - 15.5 g/dL   HCT 35.8 35.0 - 45.0 %   MCV 94.5 80.0 - 100.0 fL   MCH 30.3 27.0 - 33.0 pg   MCHC 32.1 32.0 - 36.0 g/dL   RDW 13.1 11.0 - 15.0 %   Platelets 304 140 - 400 Thousand/uL   MPV 10.0 7.5 - 12.5 fL   Neutro Abs 1,784 1,500 - 7,800 cells/uL   Lymphs Abs 1,636 850 - 3,900 cells/uL   Absolute Monocytes 300 200 - 950 cells/uL   Eosinophils Absolute 220 15 - 500 cells/uL   Basophils Absolute 60 0 - 200 cells/uL   Neutrophils Relative % 44.6 %   Total Lymphocyte 40.9 %   Monocytes Relative 7.5 %   Eosinophils Relative 5.5 %   Basophils Relative 1.5 %  COMPLETE METABOLIC PANEL WITH GFR  Result Value Ref Range   Glucose, Bld 103 (H) 65 - 99 mg/dL   BUN 14 7 - 25 mg/dL   Creat 0.68 0.60 - 1.00 mg/dL   eGFR 90 > OR = 60 mL/min/1.19m   BUN/Creatinine Ratio NOT APPLICABLE 6 - 22 (calc)   Sodium 142 135 - 146 mmol/L   Potassium 4.1 3.5 - 5.3 mmol/L   Chloride 104 98 - 110 mmol/L   CO2 31 20 - 32 mmol/L   Calcium 9.3 8.6 - 10.4 mg/dL   Total  Protein 6.5 6.1 - 8.1 g/dL   Albumin 4.0 3.6 - 5.1 g/dL   Globulin 2.5 1.9 - 3.7 g/dL (calc)   AG Ratio 1.6 1.0 - 2.5 (calc)   Total Bilirubin 0.3 0.2 - 1.2 mg/dL   Alkaline phosphatase (APISO) 68 37 - 153 U/L   AST 14 10 - 35 U/L   ALT 9 6 - 29 U/L      Assessment & Plan:   Problem List Items Addressed This Visit     Pure hypercholesterolemia   Prediabetes    Improved control A1c to 5.8      Morbid obesity with BMI of 40.0-44.9, adult (HCC)    Abnormal wt Goal to  improve lifestyle, continue to treat lymphedema      Essential hypertension    Elevated BP due to stressors Morbid Obesity, Lymphedema   Plan:  1. Continue current BP regimen Lisinopril 54m daily 2. Encourage improved lifestyle - low sodium diet, regular exercise 3. Continue monitor BP outside office, bring readings to next visit, if persistently >140/90 or new symptoms notify office sooner      Other Visit Diagnoses     Annual physical exam    -  Primary   Needs flu shot       Relevant Orders   Flu Vaccine QUAD High Dose(Fluad) (Completed)       Updated Health Maintenance information Reviewed recent lab results with patient Encouraged improvement to lifestyle with diet and exercise Goal of weight loss  Flu shot today  Bereavement grief reaction loss of nephew recently, she has support system and is managing at this time, no further intervention needed.  No orders of the defined types were placed in this encounter.    Follow up plan: Return in about 6 months (around 12/05/2021) for 6 month follow-up PreDM A1c, HTN.  ANobie Putnam [facility] 06/07/2021, 3:17 PM

## 2021-06-07 NOTE — Assessment & Plan Note (Signed)
Abnormal wt Goal to improve lifestyle, continue to treat lymphedema 

## 2021-06-07 NOTE — Patient Instructions (Addendum)
Thank you for coming to the office today.  Recommend COVID Booster Updated version Omicron Variant protection.  Flu Shot today.  Sucralfate - may take ONLY AS NEEDED for heartburn upset stomach.  Please schedule a Follow-up Appointment to: Return in about 6 months (around 12/05/2021) for 6 month follow-up PreDM A1c, HTN.  If you have any other questions or concerns, please feel free to call the office or send a message through MyChart. You may also schedule an earlier appointment if necessary.  Additionally, you may be receiving a survey about your experience at our office within a few days to 1 week by e-mail or mail. We value your feedback.  Saralyn Pilar, DO [facility], [place]

## 2021-06-07 NOTE — Assessment & Plan Note (Addendum)
Elevated BP due to stressors Morbid Obesity, Lymphedema   Plan:  1. Continue current BP regimen Lisinopril 20mg  daily 2. Encourage improved lifestyle - low sodium diet, regular exercise 3. Continue monitor BP outside office, bring readings to next visit, if persistently >140/90 or new symptoms notify office sooner

## 2021-06-07 NOTE — Assessment & Plan Note (Signed)
Improved control A1c to 5.8

## 2021-07-05 ENCOUNTER — Ambulatory Visit (INDEPENDENT_AMBULATORY_CARE_PROVIDER_SITE_OTHER): Payer: Medicare Other

## 2021-07-05 VITALS — Ht 66.0 in | Wt 278.0 lb

## 2021-07-05 DIAGNOSIS — Z0000 Encounter for general adult medical examination without abnormal findings: Secondary | ICD-10-CM | POA: Diagnosis not present

## 2021-07-05 NOTE — Progress Notes (Signed)
I connected with Megan Perez today by telephone and verified that I am speaking with the correct person using two identifiers. Location patient: home Location provider: work Persons participating in the virtual visit: Megan Perez, Elisha Ponder LPN.   I discussed the limitations, risks, security and privacy concerns of performing an evaluation and management service by telephone and the availability of in person appointments. I also discussed with the patient that there may be a patient responsible charge related to this service. The patient expressed understanding and verbally consented to this telephonic visit.    Interactive audio and video telecommunications were attempted between this provider and patient, however failed, due to patient having technical difficulties OR patient did not have access to video capability.  We continued and completed visit with audio only.     Vital signs may be patient reported or missing.  Subjective:   Megan Perez is a 75 y.o. female who presents for Medicare Annual (Subsequent) preventive examination.  Review of Systems     Cardiac Risk Factors include: advanced age (>25men, >54 women);hypertension;obesity (BMI >30kg/m2);sedentary lifestyle     Objective:    Today's Vitals   07/05/21 1516 07/05/21 1517  Weight: 278 lb (126.1 kg)   Height: 5\' 6"  (1.676 m)   PainSc:  7    Body mass index is 44.87 kg/m.  Advanced Directives 07/05/2021 09/14/2020 06/29/2020 03/18/2020 08/07/2019 05/06/2019 03/04/2019  Does Patient Have a Medical Advance Directive? No No No No No No No  Would patient like information on creating a medical advance directive? - No - Guardian declined - No - Patient declined - - No - Patient declined    Current Medications (verified) Outpatient Encounter Medications as of 07/05/2021  Medication Sig   acetaminophen (TYLENOL) 500 MG tablet Take 1,300 mg by mouth every 6 (six) hours as needed.    albuterol (VENTOLIN HFA) 108 (90  Base) MCG/ACT inhaler Inhale 2 puffs into the lungs every 4 (four) hours as needed for wheezing or shortness of breath (cough).   azelastine (OPTIVAR) 0.05 % ophthalmic solution Place 2 drops into both eyes daily as needed.   diclofenac sodium (VOLTAREN) 1 % GEL APPLY 2 GRAMS TOPICALLY FOUR TIMES DAILY AS NEEDED FOR MODERATE PAIN OF BACK AND KNEES   lisinopril (ZESTRIL) 20 MG tablet Take 1 tablet (20 mg total) by mouth daily.   Melatonin 10 MG TABS Take 10 mg by mouth at bedtime.   pantoprazole (PROTONIX) 20 MG tablet Take 1 tablet (20 mg total) by mouth daily before breakfast.   RESTASIS 0.05 % ophthalmic emulsion Place 1 drop into both eyes in the morning and at bedtime.   calcium carbonate (CALCIUM 600) 600 MG TABS tablet Take 1 tablet (600 mg total) by mouth daily.   sucralfate (CARAFATE) 1 g tablet TAKE 1 TABLET BY MOUTH FOUR TIMES DAILY(WITH MEALS AND AT BEDTIME AS NEEDED) (Patient not taking: Reported on 07/05/2021)   No facility-administered encounter medications on file as of 07/05/2021.    Allergies (verified) Baclofen, Meclizine, Other, Penicillins, Prednisone, Prevacid [lansoprazole], Gabapentin, and Oxycodone   History: Past Medical History:  Diagnosis Date   Anxiety    Asthma    reactive airway and shortness of breath w/ exertion in hot temperatures   Bronchitis    GERD (gastroesophageal reflux disease)    Hypertension    Morbid obesity with BMI of 40.0-44.9, adult (HCC) 04/08/2012   OA (osteoarthritis)    knee   Osteoporosis    Prediabetes    Sinus  infection    10/2018   Venous (peripheral) insufficiency 08/19/2010   Qualifier: Diagnosis of  By: [name], [place]     Past Surgical History:  Procedure Laterality Date   ABDOMINAL HYSTERECTOMY     CARPAL TUNNEL RELEASE Right    CHOLECYSTECTOMY     COLONOSCOPY WITH PROPOFOL N/A 06/19/2017   Procedure: COLONOSCOPY WITH PROPOFOL;  Surgeon: [name], MD;  Location: [facility];  Service: Endoscopy;  Laterality:  N/A;   HEEL SPUR SURGERY     REPLACEMENT TOTAL KNEE     left   REPLACEMENT TOTAL KNEE Right    SHOULDER ARTHROSCOPY WITH OPEN ROTATOR CUFF REPAIR Right 09/15/2015   Procedure: SHOULDER ARTHROSCOPY WITH OPEN ROTATOR CUFF REPAIR;  Surgeon: [name], MD;  Location: [facility] ORS;  Service: Orthopedics;  Laterality: Right;   SHOULDER SURGERY Left    Family History  Adopted: Yes  Problem Relation Age of Onset   Heart disease Father    Subarachnoid hemorrhage Mother    Hypertension Mother    Subarachnoid hemorrhage Sister    Hypertension Sister    Hypertension Sister    Hypertension Sister    Healthy Sister    Breast cancer Neg Hx    Social History   Socioeconomic History   Marital status: Widowed    Spouse name: Not on file   Number of children: Not on file   Years of education: Not on file   Highest education level: Associate degree: academic program  Occupational History   Occupation: retired  Tobacco Use   Smoking status: Never   Smokeless tobacco: Never  Vaping Use   Vaping Use: Never used  Substance and Sexual Activity   Alcohol use: No   Drug use: No   Sexual activity: Not Currently    Birth control/protection: Surgical  Other Topics Concern   Not on file  Social History Narrative   Retired.  Lives alone   Social Determinants of Health   Financial Resource Strain: Medium Risk   Difficulty of Paying Living Expenses: Somewhat hard  Food Insecurity: No Food Insecurity   Worried About Programme researcher, broadcasting/film/video in the Last Year: Never true   Ran Out of Food in the Last Year: Never true  Transportation Needs: No Transportation Needs   Lack of Transportation (Medical): No   Lack of Transportation (Non-Medical): No  Physical Activity: Inactive   Days of Exercise per Week: 0 days   Minutes of Exercise per Session: 0 min  Stress: No Stress Concern Present   Feeling of Stress : Not at all  Social Connections: Not on file    Tobacco Counseling Counseling given: Not  Answered   Clinical Intake:  Pre-visit preparation completed: Yes  Pain : 0-10 Pain Score: 7  Pain Type: Chronic pain Pain Location: Back (knees) Pain Orientation: Lower, Right, Left Pain Descriptors / Indicators: Aching, Heaviness Pain Onset: More than a month ago Pain Frequency: Constant Pain Relieving Factors: APAP, Aspercreme, Voltaren gel  Pain Relieving Factors: APAP, Aspercreme, Voltaren gel  Nutritional Status: BMI > 30  Obese Nutritional Risks: None Diabetes: No  How often do you need to have someone help you when you read instructions, pamphlets, or other written materials from your doctor or pharmacy?: 1 - Never What is the last grade level you completed in school?: 2 yrs college  Diabetic? no  Interpreter Needed?: No  Information entered by :: NAllen LPN   Activities of Daily Living In your present state of health, do you have  any difficulty performing the following activities: 07/05/2021  Hearing? N  Vision? N  Difficulty concentrating or making decisions? N  Walking or climbing stairs? Y  Dressing or bathing? N  Doing errands, shopping? N  Preparing Food and eating ? N  Using the Toilet? N  In the past six months, have you accidently leaked urine? Y  Do you have problems with loss of bowel control? N  Managing your Medications? N  Managing your Finances? N  Housekeeping or managing your Housekeeping? N  Some recent data might be hidden    Patient Care Team: Smitty Cords, DO as PCP - General (Family Medicine) Laurier Nancy, MD as Consulting Physician (Cardiology) [name], MD (Specialist)  Indicate any recent Medical Services you may have received from other than [facility] providers in the past year (date may be approximate).     Assessment:   This is a routine wellness examination for [facility].  Hearing/Vision screen Vision Screening - Comments:: Regular eye exams, Dr. Clydene Pugh  Dietary issues and exercise activities  discussed: Current Exercise Habits: The patient does not participate in regular exercise at present   Goals Addressed             This Visit's Progress    Patient Stated       07/05/2021, start eating more vegetables       Depression Screen PHQ 2/9 Scores 07/05/2021 06/07/2021 03/02/2021 09/14/2020 06/29/2020 05/31/2020 03/18/2020  PHQ - 2 Score 0 0 0 0 0 0 0  PHQ- 9 Score - 4 5 - - - -  Exception Documentation - - - - - - -    Fall Risk Fall Risk  07/05/2021 06/07/2021 09/27/2020 09/14/2020 06/29/2020  Falls in the past year? 0 0 0 1 1  Comment - - - patient tripped over a cord around April/May 2021 don't remember what happened  Number falls in past yr: - 0 - 1 0  Injury with Fall? - 0 - 1 1  Comment - - - - sprained ankle  Risk for fall due to : Impaired balance/gait;Medication side effect History of fall(s) - History of fall(s) Impaired balance/gait;Medication side effect  Risk for fall due to: Comment - - - - -  Follow up Falls evaluation completed;Education provided;Falls prevention discussed Falls evaluation completed - - Falls evaluation completed;Education provided;Falls prevention discussed    FALL RISK PREVENTION PERTAINING TO THE HOME:  Any stairs in or around the home? No  If so, are there any without handrails? N/a Home free of loose throw rugs in walkways, pet beds, electrical cords, etc? Yes  Adequate lighting in your home to reduce risk of falls? Yes   ASSISTIVE DEVICES UTILIZED TO PREVENT FALLS:  Life alert? Yes  Use of a cane, walker or w/c? No  Grab bars in the bathroom? Yes  Shower chair or bench in shower? Yes  Elevated toilet seat or a handicapped toilet? Yes   TIMED UP AND GO:  Was the test performed? No .      Cognitive Function:     6CIT Screen 07/05/2021 06/29/2020 04/23/2018 04/17/2017  What Year? 0 points 0 points 0 points 0 points  What month? 0 points 0 points 0 points 0 points  What time? 0 points 0 points 0 points 0 points  Count  back from 20 0 points 0 points 0 points 0 points  Months in reverse 2 points 0 points 0 points 0 points  Repeat phrase 0 points 0 points 2 points  0 points  Total Score 2 0 2 0    Immunizations Immunization History  Administered Date(s) Administered   Fluad Quad(high Dose 65+) 05/28/2019, 05/31/2020, 06/07/2021   Influenza Split 06/28/2012, 06/22/2015, 05/23/2016   Influenza Whole 08/20/2010   Influenza, High Dose Seasonal PF 08/06/2013, 06/15/2017, 07/16/2018   Influenza, Seasonal, Injecte, Preservative Fre 06/11/2014   Influenza,inj,Quad PF,6+ Mos 12/10/2014   Influenza-Unspecified 04/20/2013   Moderna Sars-Covid-2 Vaccination 11/15/2019, 12/13/2019, 07/14/2020, 01/15/2021, 06/14/2021   PPD Test 05/01/2016   Pneumococcal Conjugate-13 06/22/2015   Pneumococcal Polysaccharide-23 06/28/2012   Tdap 09/19/2007   Zoster Recombinat (Shingrix) 03/14/2018, 07/31/2018    TDAP status: Due, Education has been provided regarding the importance of this vaccine. Advised may receive this vaccine at local pharmacy or Health Dept. Aware to provide a copy of the vaccination record if obtained from local pharmacy or Health Dept. Verbalized acceptance and understanding.  Flu Vaccine status: Up to date  Pneumococcal vaccine status: Up to date  Covid-19 vaccine status: Completed vaccines  Qualifies for Shingles Vaccine? Yes   Zostavax completed No   Shingrix Completed?: Yes  Screening Tests Health Maintenance  Topic Date Due   DEXA SCAN  Never done   TETANUS/TDAP  06/07/2022 (Originally 09/18/2017)   INFLUENZA VACCINE  Completed   COVID-19 Vaccine  Completed   Hepatitis C Screening  Completed   Zoster Vaccines- Shingrix  Completed   HPV VACCINES  Aged Out    Health Maintenance  Health Maintenance Due  Topic Date Due   DEXA SCAN  Never done    Colorectal cancer screening: No longer required.   Mammogram status: No longer required due to age.  Bone Density status: due  Lung Cancer  Screening: (Low Dose CT Chest recommended if Age 75-80 years, 30 pack-year currently smoking OR have quit w/in 15years.) does not qualify.   Lung Cancer Screening Referral: no  Additional Screening:  Hepatitis C Screening: does qualify; Completed 02/16/2016  Vision Screening: Recommended annual ophthalmology exams for early detection of glaucoma and other disorders of the eye. Is the patient up to date with their annual eye exam?  Yes  Who is the provider or what is the name of the office in which the patient attends annual eye exams? Dr. Clydene Pugh If pt is not established with a provider, would they like to be referred to a provider to establish care? No .   Dental Screening: Recommended annual dental exams for proper oral hygiene  Community Resource Referral / Chronic Care Management: CRR required this visit?  No   CCM required this visit?  No      Plan:     I have personally reviewed and noted the following in the patient's chart:   Medical and social history Use of alcohol, tobacco or illicit drugs  Current medications and supplements including opioid prescriptions.  Functional ability and status Nutritional status Physical activity Advanced directives List of other physicians Hospitalizations, surgeries, and ER visits in previous 12 months Vitals Screenings to include cognitive, depression, and falls Referrals and appointments  In addition, I have reviewed and discussed with patient certain preventive protocols, quality metrics, and best practice recommendations. A written personalized care plan for preventive services as well as general preventive health recommendations were provided to patient.     Barb Merino, LPN   05/[id number]/[id number]   Nurse Notes:

## 2021-07-05 NOTE — Patient Instructions (Signed)
Megan Perez , Thank you for taking time to come for your Medicare Wellness Visit. I appreciate your ongoing commitment to your health goals. Please review the following plan we discussed and let me know if I can assist you in the future.   Screening recommendations/referrals: Colonoscopy: not required Mammogram: not required Bone Density: due Recommended yearly ophthalmology/optometry visit for glaucoma screening and checkup Recommended yearly dental visit for hygiene and checkup  Vaccinations: Influenza vaccine: completed 06/07/2021 Pneumococcal vaccine: completed 06/22/2015 Tdap vaccine: due Shingles vaccine: completed   Covid-19: 06/14/2021, 01/15/2021, 07/14/2020, 12/13/2019, 11/15/2019  Advanced directives: Advance directive discussed with you today.   Conditions/risks identified: none  Next appointment: Follow up in one year for your annual wellness visit    Preventive Care 65 Years and Older, Female Preventive care refers to lifestyle choices and visits with your health care provider that can promote health and wellness. What does preventive care include? A yearly physical exam. This is also called an annual well check. Dental exams once or twice a year. Routine eye exams. Ask your health care provider how often you should have your eyes checked. Personal lifestyle choices, including: Daily care of your teeth and gums. Regular physical activity. Eating a healthy diet. Avoiding tobacco and drug use. Limiting alcohol use. Practicing safe sex. Taking low-dose aspirin every day. Taking vitamin and mineral supplements as recommended by your health care provider. What happens during an annual well check? The services and screenings done by your health care provider during your annual well check will depend on your age, overall health, lifestyle risk factors, and family history of disease. Counseling  Your health care provider may ask you questions about your: Alcohol  use. Tobacco use. Drug use. Emotional well-being. Home and relationship well-being. Sexual activity. Eating habits. History of falls. Memory and ability to understand (cognition). Work and work Astronomer. Reproductive health. Screening  You may have the following tests or measurements: Height, weight, and BMI. Blood pressure. Lipid and cholesterol levels. These may be checked every 5 years, or more frequently if you are over 28 years old. Skin check. Lung cancer screening. You may have this screening every year starting at age 13 if you have a 30-pack-year history of smoking and currently smoke or have quit within the past 15 years. Fecal occult blood test (FOBT) of the stool. You may have this test every year starting at age 9. Flexible sigmoidoscopy or colonoscopy. You may have a sigmoidoscopy every 5 years or a colonoscopy every 10 years starting at age 35. Hepatitis C blood test. Hepatitis B blood test. Sexually transmitted disease (STD) testing. Diabetes screening. This is done by checking your blood sugar (glucose) after you have not eaten for a while (fasting). You may have this done every 1-3 years. Bone density scan. This is done to screen for osteoporosis. You may have this done starting at age 50. Mammogram. This may be done every 1-2 years. Talk to your health care provider about how often you should have regular mammograms. Talk with your health care provider about your test results, treatment options, and if necessary, the need for more tests. Vaccines  Your health care provider may recommend certain vaccines, such as: Influenza vaccine. This is recommended every year. Tetanus, diphtheria, and acellular pertussis (Tdap, Td) vaccine. You may need a Td booster every 10 years. Zoster vaccine. You may need this after age 69. Pneumococcal 13-valent conjugate (PCV13) vaccine. One dose is recommended after age 27. Pneumococcal polysaccharide (PPSV23) vaccine. One dose is  recommended after age 63. Talk to your health care provider about which screenings and vaccines you need and how often you need them. This information is not intended to replace advice given to you by your health care provider. Make sure you discuss any questions you have with your health care provider. Document Released: 10/01/2015 Document Revised: 05/24/2016 Document Reviewed: 07/06/2015 Elsevier Interactive Patient Education  2017 [place] Prevention in the Home Falls can cause injuries. They can happen to people of all ages. There are many things you can do to make your home safe and to help prevent falls. What can I do on the outside of my home? Regularly fix the edges of walkways and driveways and fix any cracks. Remove anything that might make you trip as you walk through a door, such as a raised step or threshold. Trim any bushes or trees on the path to your home. Use bright outdoor lighting. Clear any walking paths of anything that might make someone trip, such as rocks or tools. Regularly check to see if handrails are loose or broken. Make sure that both sides of any steps have handrails. Any raised decks and porches should have guardrails on the edges. Have any leaves, snow, or ice cleared regularly. Use sand or salt on walking paths during winter. Clean up any spills in your garage right away. This includes oil or grease spills. What can I do in the bathroom? Use night lights. Install grab bars by the toilet and in the tub and shower. Do not use towel bars as grab bars. Use non-skid mats or decals in the tub or shower. If you need to sit down in the shower, use a plastic, non-slip stool. Keep the floor dry. Clean up any water that spills on the floor as soon as it happens. Remove soap buildup in the tub or shower regularly. Attach bath mats securely with double-sided non-slip rug tape. Do not have throw rugs and other things on the floor that can make you  trip. What can I do in the bedroom? Use night lights. Make sure that you have a light by your bed that is easy to reach. Do not use any sheets or blankets that are too big for your bed. They should not hang down onto the floor. Have a firm chair that has side arms. You can use this for support while you get dressed. Do not have throw rugs and other things on the floor that can make you trip. What can I do in the kitchen? Clean up any spills right away. Avoid walking on wet floors. Keep items that you use a lot in easy-to-reach places. If you need to reach something above you, use a strong step stool that has a grab bar. Keep electrical cords out of the way. Do not use floor polish or wax that makes floors slippery. If you must use wax, use non-skid floor wax. Do not have throw rugs and other things on the floor that can make you trip. What can I do with my stairs? Do not leave any items on the stairs. Make sure that there are handrails on both sides of the stairs and use them. Fix handrails that are broken or loose. Make sure that handrails are as long as the stairways. Check any carpeting to make sure that it is firmly attached to the stairs. Fix any carpet that is loose or worn. Avoid having throw rugs at the top or bottom of the stairs. If you  do have throw rugs, attach them to the floor with carpet tape. Make sure that you have a light switch at the top of the stairs and the bottom of the stairs. If you do not have them, ask someone to add them for you. What else can I do to help prevent falls? Wear shoes that: Do not have high heels. Have rubber bottoms. Are comfortable and fit you well. Are closed at the toe. Do not wear sandals. If you use a stepladder: Make sure that it is fully opened. Do not climb a closed stepladder. Make sure that both sides of the stepladder are locked into place. Ask someone to hold it for you, if possible. Clearly mark and make sure that you can  see: Any grab bars or handrails. First and last steps. Where the edge of each step is. Use tools that help you move around (mobility aids) if they are needed. These include: Canes. Walkers. Scooters. Crutches. Turn on the lights when you go into a dark area. Replace any light bulbs as soon as they burn out. Set up your furniture so you have a clear path. Avoid moving your furniture around. If any of your floors are uneven, fix them. If there are any pets around you, be aware of where they are. Review your medicines with your doctor. Some medicines can make you feel dizzy. This can increase your chance of falling. Ask your doctor what other things that you can do to help prevent falls. This information is not intended to replace advice given to you by your health care provider. Make sure you discuss any questions you have with your health care provider. Document Released: 07/01/2009 Document Revised: 02/10/2016 Document Reviewed: 10/09/2014 Elsevier Interactive Patient Education  2017 [employer].

## 2021-07-13 ENCOUNTER — Telehealth: Payer: Self-pay

## 2021-07-13 NOTE — Telephone Encounter (Signed)
Dr. Kirtland Bouchard is out of the office and will not return till Monday.  This is something she will need to be seen for if she is wanting stronger pain medicine than Tylenol.

## 2021-07-13 NOTE — Telephone Encounter (Signed)
Copied from CRM [id number]. Topic: General - Inquiry >> Jul 13, 2021 12:05 PM Aretta Nip wrote: Pt has an aggravated nerve in her  lower back as was moving some furniture and felt something pop in her back, states it is a dull pain in lower back, she wants to know if Dr Kirtland Bouchard can prescribe her anything a little stronger than the Tylenol. She just saw him last month. Pls advise  1- [phone] [id number] [place], [place] - [address] AT [facility] OF [address] [place] [address] Phone: [phone] Fax: [phone] Hours: Not open 24 hours

## 2021-07-14 NOTE — Telephone Encounter (Signed)
I spoke with the patient and she informed me that she has an appt with Dr. Kirtland Bouchard on Tuesday to address her back pain.

## 2021-07-15 ENCOUNTER — Telehealth: Payer: Self-pay

## 2021-07-15 NOTE — Telephone Encounter (Signed)
   Telephone encounter was:  Successful.  07/15/2021 Name: Megan Perez MRN: [id number] DOB: 1946/07/04  Megan Perez is a 75 y.o. year old female who is a primary care patient of Smitty Cords, DO . The community resource team was consulted for assistance with  rent, utilities and food.  Care guide performed the following interventions: Spoke with patient about [employer] representative for assistance with rent and utilities. Submitted request for funds from [employer] for [employer] gift card.  Patient stated that she was current on all her bills.  Follow Up Plan:  Care guide will follow up with patient by phone over the next 7-10 days.  [name], AAS Paralegal, [facility] Care Guide  Embedded Care Coordination [name]  Care Management  [address] [place], [place] [address] ??[name]  ?? [phone]   [email or web address]

## 2021-07-18 ENCOUNTER — Other Ambulatory Visit: Payer: Self-pay | Admitting: Family Medicine

## 2021-07-18 DIAGNOSIS — I10 Essential (primary) hypertension: Secondary | ICD-10-CM

## 2021-07-18 NOTE — Telephone Encounter (Signed)
Requested Prescriptions  Pending Prescriptions Disp Refills  . lisinopril (ZESTRIL) 20 MG tablet [Pharmacy Med Name: LISINOPRIL 20MG  TABLETS] 90 tablet 1    Sig: TAKE 1 TABLET(20 MG) BY MOUTH DAILY     Cardiovascular:  ACE Inhibitors Failed - 07/18/2021  7:06 AM      Failed - Last BP in normal range    BP Readings from Last 1 Encounters:  06/07/21 (!) 160/79         Passed - Cr in normal range and within 180 days    Creat  Date Value Ref Range Status  05/31/2021 0.68 0.60 - 1.00 mg/dL Final         Passed - K in normal range and within 180 days    Potassium  Date Value Ref Range Status  05/31/2021 4.1 3.5 - 5.3 mmol/L Final         Passed - Patient is not pregnant      Passed - Valid encounter within last 6 months    Recent Outpatient Visits          1 month ago Annual physical exam   [facility], DO   4 months ago Essential hypertension   [facility], DO   7 months ago Chronic peripheral neuropathic pain   [facility], DO   7 months ago Essential hypertension   [facility] [place], [place], DO   1 year ago Annual physical exam   [facility] [place], [place], DO      Future Appointments            Tomorrow Netta Neat, Althea Charon, DO [facility], [facility]   In 4 months [facility], Althea Charon, DO [facility], [facility]   In 11 months  [facility], [facility]

## 2021-07-19 ENCOUNTER — Telehealth: Payer: Self-pay

## 2021-07-19 ENCOUNTER — Other Ambulatory Visit: Payer: Self-pay

## 2021-07-19 ENCOUNTER — Ambulatory Visit (INDEPENDENT_AMBULATORY_CARE_PROVIDER_SITE_OTHER): Payer: Medicare Other | Admitting: Family Medicine

## 2021-07-19 ENCOUNTER — Encounter: Payer: Self-pay | Admitting: Family Medicine

## 2021-07-19 VITALS — BP 164/90 | HR 81 | Ht 66.0 in | Wt 272.8 lb

## 2021-07-19 DIAGNOSIS — M545 Low back pain, unspecified: Secondary | ICD-10-CM | POA: Diagnosis not present

## 2021-07-19 DIAGNOSIS — F5104 Psychophysiologic insomnia: Secondary | ICD-10-CM | POA: Diagnosis not present

## 2021-07-19 DIAGNOSIS — G8929 Other chronic pain: Secondary | ICD-10-CM

## 2021-07-19 MED ORDER — TRAMADOL HCL 50 MG PO TABS
50.0000 mg | ORAL_TABLET | Freq: Four times a day (QID) | ORAL | 0 refills | Status: AC | PRN
Start: 2021-07-19 — End: 2021-07-24

## 2021-07-19 MED ORDER — PREDNISONE 10 MG PO TABS: ORAL_TABLET | ORAL | 0 refills | Status: DC

## 2021-07-19 NOTE — Telephone Encounter (Signed)
   Telephone encounter was:  Successful.  07/19/2021 Name: PRESSLEY BARSKY MRN: [id number] DOB: 11/11/1945  [name] is a 76 y.o. year old female who is a primary care patient of Smitty Cords, DO . The community resource team was consulted for assistance with Food Insecurity and utilities.  Care guide performed the following interventions: Spoke with patient to let her know her [employer] cards are being sent to the office per email from [place] at [facility]. Patient will call [employer] to make an appointment to apply for assistance.  Follow Up Plan:  No further follow up planned at this time. The patient has been provided with needed resources.  Kechia Yahnke, AAS Paralegal, [facility] Care Guide  Embedded Care Coordination [place]  [facility]  [address] [place], [place] [address] ??[email or web address]  ?? [phone]   [email or web address]

## 2021-07-19 NOTE — Patient Instructions (Addendum)
Thank you for coming to the office today.  1. For your Back Pain - I think that this is due to Muscle Spasms or strain.   Recommend to start using heating pad on your lower back 1-2x daily for few weeks  - Start Prednisone taper (steroid anti-inflammatory) for nerve irritation with pain in legs. Each pill is 10mg . Take 6 pills (60mg  daily) for 1 day at same time with breakfast, then each day reduce dose by 1 pill, so 5 pills, then 4, then 3, then 2 then 1 (last 6 days). Do not take any Ibuprofen or Aleve while taking the Prednisone.   Recommend to start taking Tylenol Extra Strength 500mg  tabs - take 1 to 2 tabs per dose (max 1000mg ) every 6-8 hours for pain (take regularly, don't skip a dose for next 7 days), max 24 hour daily dose is 6 tablets or 3000mg . In the future you can repeat the same everyday Tylenol course for 1-2 weeks at a time.    This pain may take weeks to months to fully resolve, but hopefully it will respond to the medicine initially. All back injuries (small or serious) are slow to heal since we use our back muscles every day. Be careful with turning, twisting, lifting, sitting / standing for prolonged periods, and avoid re-injury.  If your symptoms significantly worsen with more pain, or new symptoms with weakness in one or both legs, new or different shooting leg pains, numbness in legs or groin, loss of control or retention of urine or bowel movements, please call back for advice and you may need to go directly to the Emergency Department.   Try these things first, go to bed later, rather than earlier and that will help you sleep better. Avoid screen time 1 hour before bed. If still not helpful, call back and we can order Trazodone nightly sleeping pill.  Sleep Hygiene Recommendations to promote healthy sleep in all patients, especially if symptoms of insomnia are worsening. Due to the nature of sleep rhythms, if your body gets "out of rhythm", it may take some time before  your sleep cycle can be "reset".  Please try to follow as many of the following tips as you can, usually there are only a few of these are the primary cause of the problem.  ?To reset your sleep rhythm, go to bed and get up at the same time every day ?Sleep only long enough to feel rested and then get out of bed ?Do not try to force yourself to sleep. If you can't sleep, get out of bed and try again later. ?Avoid naps during the day, unless excessively tired. The more sleeping during the day, then the less sleep your body needs at night.  ?Have coffee, tea, and other foods that have caffeine only in the morning ?Exercise several days a week, but not right before bed ?If you drink alcohol, prefer to have appropriate drink with one meal, but prefer to avoid alcohol in the evening, and bedtime ?If you smoke, avoid smoking, especially in the evening  ?Avoid watching TV or looking at phones, computers, or reading devices ("e-books") that give off light at least 30 minutes before bed. This artificial light sends "awake signals" to your brain and can make it harder to fall asleep. ?Make your bedroom a comfortable place where it is easy to fall asleep: Put up shades or special blackout curtains to block light from outside. Use a white noise machine to block noise. Keep the  temperature cool. ?Try your best to solve or at least address your problems before you go to bed ?Use relaxation techniques to manage stress. Ask your health care provider to suggest some techniques that may work well for you. These may include: Breathing exercises. Routines to release muscle tension. Visualizing peaceful scenes.   Please schedule a Follow-up Appointment to: Return if symptoms worsen or fail to improve.  If you have any other questions or concerns, please feel free to call the office or send a message through MyChart. You may also schedule an earlier appointment if necessary.  Additionally, you may be  receiving a survey about your experience at our office within a few days to 1 week by e-mail or mail. We value your feedback.  Megan Pilar, DO [facility], [place]

## 2021-07-19 NOTE — Progress Notes (Signed)
Subjective:    Patient ID: Megan Perez, female    DOB: 1946-07-16, 75 y.o.   MRN: [id number]  Megan Perez is a 75 y.o. female presenting on 07/19/2021 for Back Pain   HPI  Chronic Low Back / Hip Pain, with radiating sciatica down Right leg to knee Followed by Dr Dossie Arbour S/p prior radiofrequency ablation, had intolerance to 2nd dose.  New flare up 2 weeks ago, she was pushing something heavy and caused a flare up of back pain middle of low back, not radiating pain into legs.  Previously with [facility], followed by Dr Dossie Arbour, however now she has not returned since March 2022. After issue with medications were too strong Lyrica and Tizanidine.  Difficulty with insomnia, tried Melatonin up to 76m and tried OTC remedy and sleepy time tea She admits lays awake for few hours.   [facility] 07/05/2021 06/07/2021 03/02/2021  Decreased Interest 0 0 0  Down, Depressed, Hopeless 0 0 0  PHQ - 2 Score 0 0 0  Altered sleeping - 3 3  Tired, decreased energy - 1 1  Change in appetite - 0 1  Feeling bad or failure about yourself  - 0 0  Trouble concentrating - 0 0  Moving slowly or fidgety/restless - 0 0  Suicidal thoughts - 0 0  PHQ-9 Score - 4 5  Difficult doing work/chores - Not difficult at all Somewhat difficult    Social History   Tobacco Use   Smoking status: Never   Smokeless tobacco: Never  Vaping Use   Vaping Use: Never used  Substance Use Topics   Alcohol use: No   Drug use: No    Review of Systems Per HPI unless specifically indicated above     Objective:    BP (!) 164/90   Pulse 81   Ht _0  (1.676 m)   Wt 272 lb 12.8 oz (123.7 kg)   SpO2 98%   BMI 44.03 kg/m   Wt Readings from Last 3 Encounters:  07/19/21 272 lb 12.8 oz (123.7 kg)  07/05/21 278 lb (126.1 kg)  06/07/21 277 lb 9.6 oz (125.9 kg)    Physical Exam Vitals and nursing note reviewed.  Constitutional:      General: She is not in acute distress.    Appearance:  Normal appearance. She is well-developed. She is obese. She is not diaphoretic.     Comments: Well-appearing, comfortable, cooperative  HENT:     Head: Normocephalic and atraumatic.  Eyes:     General:        Right eye: No discharge.        Left eye: No discharge.     Conjunctiva/sclera: Conjunctivae normal.  Cardiovascular:     Rate and Rhythm: Normal rate.  Pulmonary:     Effort: Pulmonary effort is normal.  Skin:    General: Skin is warm and dry.     Findings: No erythema or rash.  Neurological:     Mental Status: She is alert and oriented to person, place, and time.  Psychiatric:        Mood and Affect: Mood normal.        Behavior: Behavior normal.        Thought Content: Thought content normal.     Comments: Well groomed, good eye contact, normal speech and thoughts   Results for orders placed or performed in visit on 05/30/21  TSH  Result Value Ref Range   TSH 1.78  0.40 - 4.50 mIU/L  Hemoglobin A1c  Result Value Ref Range   Hgb A1c MFr Bld 5.8 (H) <5.7 % of total Hgb   Mean Plasma Glucose 120 mg/dL   eAG (mmol/L) 6.6 mmol/L  Lipid panel  Result Value Ref Range   Cholesterol 175 <200 mg/dL   HDL 56 > OR = 50 mg/dL   Triglycerides 75 <150 mg/dL   LDL Cholesterol (Calc) 103 (H) mg/dL (calc)   Total CHOL/HDL Ratio 3.1 <5.0 (calc)   Non-HDL Cholesterol (Calc) 119 <130 mg/dL (calc)  CBC with Differential/Platelet  Result Value Ref Range   WBC 4.0 3.8 - 10.8 Thousand/uL   RBC 3.79 (L) 3.80 - 5.10 Million/uL   Hemoglobin 11.5 (L) 11.7 - 15.5 g/dL   HCT 35.8 35.0 - 45.0 %   MCV 94.5 80.0 - 100.0 fL   MCH 30.3 27.0 - 33.0 pg   MCHC 32.1 32.0 - 36.0 g/dL   RDW 13.1 11.0 - 15.0 %   Platelets 304 140 - 400 Thousand/uL   MPV 10.0 7.5 - 12.5 fL   Neutro Abs 1,784 1,500 - 7,800 cells/uL   Lymphs Abs 1,636 850 - 3,900 cells/uL   Absolute Monocytes 300 200 - 950 cells/uL   Eosinophils Absolute 220 15 - 500 cells/uL   Basophils Absolute 60 0 - 200 cells/uL   Neutrophils  Relative % 44.6 %   Total Lymphocyte 40.9 %   Monocytes Relative 7.5 %   Eosinophils Relative 5.5 %   Basophils Relative 1.5 %  COMPLETE METABOLIC PANEL WITH GFR  Result Value Ref Range   Glucose, Bld 103 (H) 65 - 99 mg/dL   BUN 14 7 - 25 mg/dL   Creat 0.68 0.60 - 1.00 mg/dL   eGFR 90 > OR = 60 mL/min/1.54m   BUN/Creatinine Ratio NOT APPLICABLE 6 - 22 (calc)   Sodium 142 135 - 146 mmol/L   Potassium 4.1 3.5 - 5.3 mmol/L   Chloride 104 98 - 110 mmol/L   CO2 31 20 - 32 mmol/L   Calcium 9.3 8.6 - 10.4 mg/dL   Total Protein 6.5 6.1 - 8.1 g/dL   Albumin 4.0 3.6 - 5.1 g/dL   Globulin 2.5 1.9 - 3.7 g/dL (calc)   AG Ratio 1.6 1.0 - 2.5 (calc)   Total Bilirubin 0.3 0.2 - 1.2 mg/dL   Alkaline phosphatase (APISO) 68 37 - 153 U/L   AST 14 10 - 35 U/L   ALT 9 6 - 29 U/L      Assessment & Plan:   Problem List Items Addressed This Visit     Psychophysiological insomnia - Primary   Other Visit Diagnoses     Chronic midline low back pain without sciatica       Relevant Medications   predniSONE (DELTASONE) 10 MG tablet   traMADol (ULTRAM) 50 MG tablet       Acute on chronic midline LBP without associated sciatica. Suspect likely due to muscle spasm/strain, without known injury or trauma.   In setting of known chronic LBP with DJD - No red flag symptoms. Negative SLR for radiculopathy - Inadequate conservative therapy   Plan: 1. Start anti-inflammatory Prednisone taper 6 day 2. Tramadol PRN for breakthrough pain, reviewed prior use of Tramadol in past, has tolerated. Prefer to avoid opiates long term. No longer w/ Pain Management 3. Continue Tylenol regular dosing 4. Encouraged use of heating pad 1-2x daily for now then PRN 5.  Follow-up 4-6 weeks if not improved for re-evaluation,  consider X-ray imaging, trial of PT, and possibly referral to Orthopedic    Meds ordered this encounter  Medications   predniSONE (DELTASONE) 10 MG tablet    Sig: Take 6 tabs with breakfast Day 1,  5 tabs Day 2, 4 tabs Day 3, 3 tabs Day 4, 2 tabs Day 5, 1 tab Day 6.    Dispense:  21 tablet    Refill:  0   traMADol (ULTRAM) 50 MG tablet    Sig: Take 1 tablet (50 mg total) by mouth every 6 (six) hours as needed for up to 5 days.    Dispense:  20 tablet    Refill:  0      Follow up plan: Return if symptoms worsen or fail to improve.   Nobie Putnam, [facility] 07/19/2021, 4:06 PM

## 2021-07-27 DIAGNOSIS — H02885 Meibomian gland dysfunction left lower eyelid: Secondary | ICD-10-CM | POA: Diagnosis not present

## 2021-07-27 DIAGNOSIS — H2513 Age-related nuclear cataract, bilateral: Secondary | ICD-10-CM | POA: Diagnosis not present

## 2021-07-27 DIAGNOSIS — H35033 Hypertensive retinopathy, bilateral: Secondary | ICD-10-CM | POA: Diagnosis not present

## 2021-07-27 DIAGNOSIS — H02882 Meibomian gland dysfunction right lower eyelid: Secondary | ICD-10-CM | POA: Diagnosis not present

## 2021-08-08 ENCOUNTER — Other Ambulatory Visit: Payer: Self-pay | Admitting: Family Medicine

## 2021-08-08 DIAGNOSIS — K219 Gastro-esophageal reflux disease without esophagitis: Secondary | ICD-10-CM

## 2021-08-08 MED ORDER — PANTOPRAZOLE SODIUM 20 MG PO TBEC: 20.0000 mg | DELAYED_RELEASE_TABLET | Freq: Every day | ORAL | 1 refills | Status: DC

## 2021-08-08 NOTE — Telephone Encounter (Signed)
Requested Prescriptions  Pending Prescriptions Disp Refills  . pantoprazole (PROTONIX) 20 MG tablet 90 tablet 1    Sig: Take 1 tablet (20 mg total) by mouth daily before breakfast.     Gastroenterology: Proton Pump Inhibitors Passed - 08/08/2021  3:34 PM      Passed - Valid encounter within last 12 months    Recent Outpatient Visits          2 weeks ago Psychophysiological insomnia   [facility] [place], Netta Neat, DO   2 months ago Annual physical exam   [facility] Smitty Cords, DO   5 months ago Essential hypertension   [facility] Smitty Cords, DO   7 months ago Chronic peripheral neuropathic pain   [facility] Smitty Cords, DO   8 months ago Essential hypertension   [facility] Althea Charon, Netta Neat, DO      Future Appointments            In 4 months Althea Charon, Netta Neat, DO [facility], [facility]   In 11 months  [facility]           Refused Prescriptions Disp Refills  . pantoprazole (PROTONIX) 20 MG tablet [Pharmacy Med Name: PANTOPRAZOLE 20MG  TABLETS] 90 tablet 3    Sig: TAKE 1 TABLET BY MOUTH DAILY BEFORE BREAKFAST     Gastroenterology: Proton Pump Inhibitors Passed - 08/08/2021  3:34 PM      Passed - Valid encounter within last 12 months    Recent Outpatient Visits          2 weeks ago Psychophysiological insomnia   [facility] [place], [place], DO   2 months ago Annual physical exam   [facility], DO   5 months ago Essential hypertension   [facility], DO   7 months ago Chronic peripheral neuropathic pain   [facility], DO   8 months ago Essential hypertension   [facility], Althea Charon, DO      Future Appointments             In 4 months Netta Neat, Althea Charon, DO [facility], [facility]   In 11 months  [facility], [facility]

## 2021-08-08 NOTE — Telephone Encounter (Signed)
Pt called back to advise she only has a few left of the pantoprazole (PROTONIX) 20 MG tablet. She is going to be traveling and does not want mail order to come while she is gone. She would like to send in her next 90 day refill to  [id number] - [place], [place] - [address]   Pt would like to switch to [facility] in [place] for the remaining refills.

## 2021-08-08 NOTE — Telephone Encounter (Signed)
Pt is calling to check on the status of the medication refill. Pt will be traveling out of town tomorrow. Advised that medication refills can take up to 3 business days.

## 2021-08-08 NOTE — Telephone Encounter (Signed)
Call to patient- left message to let office know supply needed for trip- or is she changing pharmacy. Last Rx went to [facility]- 03/02/21 #[id number]- this request is from [employer].

## 2021-09-16 ENCOUNTER — Telehealth: Payer: Self-pay

## 2021-09-16 ENCOUNTER — Encounter: Payer: Self-pay | Admitting: Family Medicine

## 2021-09-16 DIAGNOSIS — R059 Cough, unspecified: Secondary | ICD-10-CM

## 2021-09-16 MED ORDER — BENZONATATE 100 MG PO CAPS: 100.0000 mg | ORAL_CAPSULE | Freq: Three times a day (TID) | ORAL | 0 refills | Status: DC | PRN

## 2021-09-16 NOTE — Telephone Encounter (Signed)
Sent tessalon for cough  Print and will mail letter to her for the [employer] [place], she can turn it in.  Saralyn Pilar, DO [facility] 09/16/2021, 1:17 PM

## 2021-09-16 NOTE — Telephone Encounter (Signed)
Copied from CRM [id number]. Topic: General - Other >> Sep 16, 2021  8:25 AM Traci Sermon wrote: Reason for CRM: Pt called in stating the city of [place] needs a letter from PCP stating pt is unable to bring trash down to the road and need the trash men to come up to her door, and requesting if PCP could write that, pt also stated she has a cough and wondered if she could get prescribed something. Please advise.

## 2021-09-17 DIAGNOSIS — R519 Headache, unspecified: Secondary | ICD-10-CM | POA: Diagnosis not present

## 2021-09-17 DIAGNOSIS — R062 Wheezing: Secondary | ICD-10-CM | POA: Diagnosis not present

## 2021-09-17 DIAGNOSIS — I10 Essential (primary) hypertension: Secondary | ICD-10-CM | POA: Diagnosis not present

## 2021-09-17 DIAGNOSIS — J029 Acute pharyngitis, unspecified: Secondary | ICD-10-CM | POA: Diagnosis not present

## 2021-09-17 DIAGNOSIS — R079 Chest pain, unspecified: Secondary | ICD-10-CM | POA: Diagnosis not present

## 2021-09-17 DIAGNOSIS — R0789 Other chest pain: Secondary | ICD-10-CM | POA: Diagnosis not present

## 2021-09-17 DIAGNOSIS — R0981 Nasal congestion: Secondary | ICD-10-CM | POA: Diagnosis not present

## 2021-09-17 DIAGNOSIS — R059 Cough, unspecified: Secondary | ICD-10-CM | POA: Diagnosis not present

## 2021-09-17 DIAGNOSIS — Z20822 Contact with and (suspected) exposure to covid-19: Secondary | ICD-10-CM | POA: Diagnosis not present

## 2021-09-26 ENCOUNTER — Ambulatory Visit: Payer: Self-pay | Admitting: *Deleted

## 2021-09-26 NOTE — Telephone Encounter (Signed)
Reason for Disposition  Continuous (nonstop) coughing interferes with work, school, or sleeping  Answer Assessment - Initial Assessment Questions 1. RESPIRATORY STATUS: "Describe your breathing?" (e.g., wheezing, shortness of breath, unable to speak, severe coughing)      Pt says the medication benzonate is making her wheeze.   It's for coughing.   I went to the ED in [place] and they gave medication.   I'm coughing up clear mucus.   I have coughing spells.    2. ONSET: "When did this breathing problem begin?"      The wheezing and coughing spells been going on for a week now.  I was given an inhaler in the ED I don't know how to use it. When I walked to the mailbox it really gets me winded.   I need a letter for the city to get my trash since I can't roll it out.   3. PATTERN "Does the difficult breathing come and go, or has it been constant since it started?"      *No Answer* 4. SEVERITY: "How bad is your breathing?" (e.g., mild, moderate, severe)    - MILD: No SOB at rest, mild SOB with walking, speaks normally in sentences, can lie down, no retractions, pulse < 100.    - MODERATE: SOB at rest, SOB with minimal exertion and prefers to sit, cannot lie down flat, speaks in phrases, mild retractions, audible wheezing, pulse 100-120.    - SEVERE: Very SOB at rest, speaks in single words, struggling to breathe, sitting hunched forward, retractions, pulse > 120      Moderate shortness of breath going to the mailbox 5. RECURRENT SYMPTOM: "Have you had difficulty breathing before?" If Yes, ask: "When was the last time?" and "What happened that time?"      *No Answer* 6. CARDIAC HISTORY: "Do you have any history of heart disease?" (e.g., heart attack, angina, bypass surgery, angioplasty)      *No Answer* 7. LUNG HISTORY: "Do you have any history of lung disease?"  (e.g., pulmonary embolus, asthma, emphysema)     *No Answer* 8. CAUSE: "What do you think is causing the breathing problem?"       *No Answer* 9. OTHER SYMPTOMS: "Do you have any other symptoms? (e.g., dizziness, runny nose, cough, chest pain, fever)     *No Answer* 10. O2 SATURATION MONITOR:  "Do you use an oxygen saturation monitor (pulse oximeter) at home?" If Yes, "What is your reading (oxygen level) today?" "What is your usual oxygen saturation reading?" (e.g., 95%)       *No Answer* 11. PREGNANCY: "Is there any chance you are pregnant?" "When was your last menstrual period?"       *No Answer* 12. TRAVEL: "Have you traveled out of the country in the last month?" (e.g., travel history, exposures)       *No Answer*  Protocols used: Breathing Difficulty-A-AH  Chief Complaint: coughing a lot and wheezing.  Shortness of breath.  Coughing spells.  Seen in ED for same, given meds, not better Symptoms: wheezing and coughing a lot.   Frequency:   Coughing spells Pertinent Negatives: Patient denies  Disposition: [] ED /[] Urgent Care (no appt availability in office) / [x] Appointment(In office/virtual)/ []  [facility]/ [] Home Care/ [] Refused Recommended Disposition /[] [facility]/ []  Follow-up with PCP Additional Notes: Pt has an appt for 1/10 in the morning.   She was wanting to know if she could be seen today.   She called while the office  was closed for lunch.   No appts today.   I let her know someone would call her back and let her know if she could be worked in today for an appt.

## 2021-09-26 NOTE — Telephone Encounter (Signed)
Pt has an appt tomorrow with Dr. Kirtland Bouchard. We will have the copy for her at that time.

## 2021-09-26 NOTE — Telephone Encounter (Signed)
Patient called in to inform Dr Kirtland Bouchard that she had not received the letter that was mailed out to her so woulld like a call back to know if she can come pick up a copy

## 2021-09-26 NOTE — Telephone Encounter (Signed)
We do not have anymore openings today. Per Dr. Kirtland Bouchard

## 2021-09-27 ENCOUNTER — Other Ambulatory Visit: Payer: Self-pay

## 2021-09-27 ENCOUNTER — Encounter: Payer: Self-pay | Admitting: Family Medicine

## 2021-09-27 ENCOUNTER — Ambulatory Visit (INDEPENDENT_AMBULATORY_CARE_PROVIDER_SITE_OTHER): Payer: Medicare Other | Admitting: Family Medicine

## 2021-09-27 VITALS — BP 154/86 | HR 85 | Ht 66.0 in | Wt 274.6 lb

## 2021-09-27 DIAGNOSIS — J4521 Mild intermittent asthma with (acute) exacerbation: Secondary | ICD-10-CM

## 2021-09-27 MED ORDER — PREDNISONE 50 MG PO TABS: 50.0000 mg | ORAL_TABLET | Freq: Every day | ORAL | 0 refills | Status: DC

## 2021-09-27 MED ORDER — AZITHROMYCIN 250 MG PO TABS: ORAL_TABLET | ORAL | 0 refills | Status: DC

## 2021-09-27 NOTE — Patient Instructions (Addendum)
Thank you for coming to the office today.  Likely respiratory virus caused it and triggered Asthma reaction  Use albuterol with SPACER tube 2 puffs every 4-6 hours as needed for cough.  Start Azithromycin Z pak (antibiotic) 2 tabs day 1, then 1 tab x 4 days, complete entire course even if improved  Start Prednisone 50mg  daily x 5 days with meal  May take 4+ weeks to resolve fully, cough will likely LINGER and cause worse cough at night.  Please schedule a Follow-up Appointment to: Return if symptoms worsen or fail to improve.  If you have any other questions or concerns, please feel free to call the office or send a message through MyChart. You may also schedule an earlier appointment if necessary.  Additionally, you may be receiving a survey about your experience at our office within a few days to 1 week by e-mail or mail. We value your feedback.  , DO [facility], [facility]

## 2021-09-27 NOTE — Progress Notes (Signed)
Subjective:    Patient ID: Megan Perez, female    DOB: November 24, 1945, 76 y.o.   MRN: [id number]  Megan Perez is a 76 y.o. female presenting on 09/27/2021 for Hospitalization Follow-up   HPI   ED FOLLOW-UP VISIT  Hospital/Location: [facility] ED Date of ED Visit: 09/17/21  Reason for Presenting to ED: Cough Wheezing Dyspnea  FOLLOW-UP  - ED provider note and record have been reviewed - Patient presents today about 10 days after recent ED visit. Brief summary of recent course, patient had symptoms of persistent productive cough, presented to ED on 09/17/21, testing in ED with Flu COVID RSV all negative, treated with CXR imaging negative, was given Toradol PRN, Guaifenesin, rx Albuterol PRN.   Note we called in Tessalon on 12/30 she had to cancel her apt and she had wheezing reaction to the Tessalon.  - Today reports overall has done well after discharge from ED. Symptoms of persistent cough and wheezing, still productive cough. She has albuterol and spacer unsure how to use it. Last steroid course 07/2021, not recently - Takes Tylenol PRN   Depression screen [facility] 2/9 07/05/2021 06/07/2021 03/02/2021  Decreased Interest 0 0 0  Down, Depressed, Hopeless 0 0 0  PHQ - 2 Score 0 0 0  Altered sleeping - 3 3  Tired, decreased energy - 1 1  Change in appetite - 0 1  Feeling bad or failure about yourself  - 0 0  Trouble concentrating - 0 0  Moving slowly or fidgety/restless - 0 0  Suicidal thoughts - 0 0  PHQ-9 Score - 4 5  Difficult doing work/chores - Not difficult at all Somewhat difficult    Social History   Tobacco Use   Smoking status: Never   Smokeless tobacco: Never  Vaping Use   Vaping Use: Never used  Substance Use Topics   Alcohol use: No   Drug use: No    Review of Systems Per HPI unless specifically indicated above     Objective:    BP (!) 154/86    Pulse 85    Ht 5' 6"  (1.676 m)    Wt 274 lb 9.6 oz (124.6 kg)    SpO2 100%    BMI 44.32 kg/m   Wt  Readings from Last 3 Encounters:  09/27/21 274 lb 9.6 oz (124.6 kg)  07/19/21 272 lb 12.8 oz (123.7 kg)  07/05/21 278 lb (126.1 kg)    Physical Exam Vitals and nursing note reviewed.  Constitutional:      General: She is not in acute distress.    Appearance: She is well-developed. She is obese. She is not diaphoretic.     Comments: Well-appearing, comfortable, cooperative  HENT:     Head: Normocephalic and atraumatic.  Eyes:     General:        Right eye: No discharge.        Left eye: No discharge.     Conjunctiva/sclera: Conjunctivae normal.  Neck:     Thyroid: No thyromegaly.  Cardiovascular:     Rate and Rhythm: Normal rate and regular rhythm.     Heart sounds: Normal heart sounds. No murmur heard. Pulmonary:     Effort: Pulmonary effort is normal. No respiratory distress.     Breath sounds: Wheezing and rhonchi present. No rales.     Comments: coughing Musculoskeletal:        General: Normal range of motion.     Cervical back: Normal range of motion and neck  supple.  Lymphadenopathy:     Cervical: No cervical adenopathy.  Skin:    General: Skin is warm and dry.     Findings: No erythema or rash.  Neurological:     Mental Status: She is alert and oriented to person, place, and time.  Psychiatric:        Behavior: Behavior normal.     Comments: Well groomed, good eye contact, normal speech and thoughts     Results for orders placed or performed in visit on 05/30/21  TSH  Result Value Ref Range   TSH 1.78 0.40 - 4.50 mIU/L  Hemoglobin A1c  Result Value Ref Range   Hgb A1c MFr Bld 5.8 (H) <5.7 % of total Hgb   Mean Plasma Glucose 120 mg/dL   eAG (mmol/L) 6.6 mmol/L  Lipid panel  Result Value Ref Range   Cholesterol 175 <200 mg/dL   HDL 56 > OR = 50 mg/dL   Triglycerides 75 <150 mg/dL   LDL Cholesterol (Calc) 103 (H) mg/dL (calc)   Total CHOL/HDL Ratio 3.1 <5.0 (calc)   Non-HDL Cholesterol (Calc) 119 <130 mg/dL (calc)  CBC with Differential/Platelet   Result Value Ref Range   WBC 4.0 3.8 - 10.8 Thousand/uL   RBC 3.79 (L) 3.80 - 5.10 Million/uL   Hemoglobin 11.5 (L) 11.7 - 15.5 g/dL   HCT 35.8 35.0 - 45.0 %   MCV 94.5 80.0 - 100.0 fL   MCH 30.3 27.0 - 33.0 pg   MCHC 32.1 32.0 - 36.0 g/dL   RDW 13.1 11.0 - 15.0 %   Platelets 304 140 - 400 Thousand/uL   MPV 10.0 7.5 - 12.5 fL   Neutro Abs 1,784 1,500 - 7,800 cells/uL   Lymphs Abs 1,636 850 - 3,900 cells/uL   Absolute Monocytes 300 200 - 950 cells/uL   Eosinophils Absolute 220 15 - 500 cells/uL   Basophils Absolute 60 0 - 200 cells/uL   Neutrophils Relative % 44.6 %   Total Lymphocyte 40.9 %   Monocytes Relative 7.5 %   Eosinophils Relative 5.5 %   Basophils Relative 1.5 %  COMPLETE METABOLIC PANEL WITH GFR  Result Value Ref Range   Glucose, Bld 103 (H) 65 - 99 mg/dL   BUN 14 7 - 25 mg/dL   Creat 0.68 0.60 - 1.00 mg/dL   eGFR 90 > OR = 60 mL/min/1.3m   BUN/Creatinine Ratio NOT APPLICABLE 6 - 22 (calc)   Sodium 142 135 - 146 mmol/L   Potassium 4.1 3.5 - 5.3 mmol/L   Chloride 104 98 - 110 mmol/L   CO2 31 20 - 32 mmol/L   Calcium 9.3 8.6 - 10.4 mg/dL   Total Protein 6.5 6.1 - 8.1 g/dL   Albumin 4.0 3.6 - 5.1 g/dL   Globulin 2.5 1.9 - 3.7 g/dL (calc)   AG Ratio 1.6 1.0 - 2.5 (calc)   Total Bilirubin 0.3 0.2 - 1.2 mg/dL   Alkaline phosphatase (APISO) 68 37 - 153 U/L   AST 14 10 - 35 U/L   ALT 9 6 - 29 U/L    09/18/2021 8:41 AM EST   EXAM: XR CHEST 2 VIEWS DATE: 09/17/2021 9:25 PM ACCESSION: [id number] DICTATED: 09/17/2021 9:48 PM [place]INICAL INDICATION: 76year old female with cough.    COMPARISON: None.  TECHNIQUE: PA and lateral chest radiograph.  FINDINGS: Clear lungs with no pleural fluid and a normal cardiomediastinal contour. Cholecystectomy. Slight tortuosity aorta.    --   CLINICAL  DATA:  Acute onset of mid chest pain and productive cough. Shortness of breath. Initial encounter.   EXAM: CT ANGIOGRAPHY CHEST  WITH CONTRAST   TECHNIQUE: Multidetector CT imaging of the chest was performed using the standard protocol during bolus administration of intravenous contrast. Multiplanar CT image reconstructions and MIPs were obtained to evaluate the vascular anatomy.   CONTRAST:  156m OMNIPAQUE IOHEXOL 350 MG/ML SOLN   COMPARISON:  Chest radiograph performed earlier today at 4:47 a.m., and CTA of the chest performed 06/17/2010   FINDINGS: There is no evidence of pulmonary embolus.   Mild haziness is noted within both lungs. This could reflect minimal interstitial edema, or mild atelectasis. There is no evidence of pleural effusion or pneumothorax. No masses are identified; no abnormal focal contrast enhancement is seen.   The mediastinum is unremarkable in appearance. No mediastinal lymphadenopathy is seen. No pericardial effusion is identified. Scattered nodes about the hila appear normal in size. The great vessels are grossly unremarkable. No axillary lymphadenopathy is seen. The thyroid gland is unremarkable in appearance.   The visualized portions of the liver and spleen are unremarkable. The patient is status post cholecystectomy, with clips noted at the gallbladder fossa.   No acute osseous abnormalities are seen.   Review of the MIP images confirms the above findings.   IMPRESSION: 1. No evidence of pulmonary embolus. 2. Mild haziness with both lungs. This could reflect minimal interstitial edema, given the patient's symptoms, or mild atelectasis.     Electronically Signed   By: JGarald BaldingM.D.   On: 11/19/2015 06:53     Assessment & Plan:   Problem List Items Addressed This Visit     Mild intermittent asthma without complication - Primary   Relevant Medications   azithromycin (ZITHROMAX Z-PAK) 250 MG tablet   predniSONE (DELTASONE) 50 MG tablet    Consistent with acute asthma exacerbation in setting of mild int vs persistent asthma. Not on maintenance Likely  secondary to viral URI trigger Recent ED negative COVID Flu RSV  Likely bronchospasm w/ asthma.  Plan: 1. Start Prednisone burst 557mdaily x 5 days 2. Add Azithromycin Z pak (antibiotic) 2 tabs day 1, then 1 tab x 4 days, complete entire course even if improved - given productive cough, will cover empirically given her concern 3. Demo how to use spacer - Continue Albuterol 2 puffs q 4-6 hour PRN wheezing/cough/SOB x 3 days regularly, then PRN DC Tessalon due to allergy Reconsider other cough med, already has mucinex.  Return criteria given to follow-up vs when to go to ED  Contact us[place]orea[place]in 1 week may extend prednisone taper   Meds ordered this encounter  Medications   azithromycin (ZITHROMAX Z-PAK) 250 MG tablet    Sig: Take 2 tabs (50060motal) on Day 1. Take 1 tab (250m49maily for next 4 days.    Dispense:  6 tablet    Refill:  0   predniSONE (DELTASONE) 50 MG tablet    Sig: Take 1 tablet (50 mg total) by mouth daily with breakfast.    Dispense:  5 tablet    Refill:  0     Follow up plan: Return if symptoms worsen or fail to improve.  AlexNobie Putnam [facility] 09/27/2021, 8:07 AM

## 2021-12-06 ENCOUNTER — Other Ambulatory Visit: Payer: Self-pay

## 2021-12-06 ENCOUNTER — Ambulatory Visit (INDEPENDENT_AMBULATORY_CARE_PROVIDER_SITE_OTHER): Payer: Medicare Other | Admitting: Family Medicine

## 2021-12-06 ENCOUNTER — Encounter: Payer: Self-pay | Admitting: Family Medicine

## 2021-12-06 VITALS — BP 130/70 | HR 67 | Ht 66.0 in | Wt 269.0 lb

## 2021-12-06 DIAGNOSIS — J0110 Acute frontal sinusitis, unspecified: Secondary | ICD-10-CM | POA: Diagnosis not present

## 2021-12-06 DIAGNOSIS — R7303 Prediabetes: Secondary | ICD-10-CM | POA: Diagnosis not present

## 2021-12-06 MED ORDER — AZITHROMYCIN 250 MG PO TABS: ORAL_TABLET | ORAL | 0 refills | Status: DC

## 2021-12-06 MED ORDER — FLUTICASONE PROPIONATE 50 MCG/ACT NA SUSP: 2.0000 | Freq: Every day | NASAL | 3 refills | Status: DC

## 2021-12-06 NOTE — Patient Instructions (Addendum)
Thank you for coming to the office today. ? ?Likely sinusitis and inner ear causing symptoms ? ?Start Azithromycin Z pak (antibiotic) 2 tabs day 1, then 1 tab x 4 days, complete entire course even if improved ? ?Start nasal steroid Flonase 2 sprays in each nostril daily for 4-6 weeks, may repeat course seasonally or as needed ? ?---- ? ?You have symptoms of Vertigo (Benign Paroxysmal Positional Vertigo) ?- This is commonly caused by inner ear fluid imbalance, sometimes can be worsened by allergies and sinus symptoms, otherwise it can occur randomly sometimes and we may never discover the exact cause. ?- To treat this, try the Epley Manuever (see diagrams/instructions below) at home up to 3 times a day for 1-2 weeks or until symptoms resolve ? ? ? ?---------------------------------------------------------------------------------------------------------------------- ? ? ? ? ? ? ?DUE for FASTING BLOOD WORK (no food or drink after midnight before the lab appointment, only water or coffee without cream/sugar on the morning of) ? ?SCHEDULE "Lab Only" visit in the morning at the clinic for lab draw in 6 MONTHS  ? ?- Make sure Lab Only appointment is at about 1 week before your next appointment, so that results will be available ? ?For Lab Results, once available within 2-3 days of blood draw, you can can log in to MyChart online to view your results and a brief explanation. Also, we can discuss results at next follow-up visit. ? ? ? ?Please schedule a Follow-up Appointment to: Return in about 6 months (around 06/08/2022) for 6 month fasting lab only then 1 week later Annual Physical. ? ?If you have any other questions or concerns, please feel free to call the office or send a message through MyChart. You may also schedule an earlier appointment if necessary. ? ?Additionally, you may be receiving a survey about your experience at our office within a few days to 1 week by e-mail or mail. We value your feedback. ? ?Saralyn Pilar, DO ?[facility], [place] ?

## 2021-12-06 NOTE — Assessment & Plan Note (Signed)
Encourage lifestyle management of PreDM ?A1c due today ?Prior range 5.8 ?Not on medication ?

## 2021-12-06 NOTE — Progress Notes (Signed)
? ?Subjective:  ? ? Patient ID: Megan Perez, female    DOB: 11-11-1945, 75 y.o.   MRN: [id number] ? ?Megan Perez is a 76 y.o. female presenting on 12/06/2021 for Prediabetes and Dizziness ? ? ?HPI ? ?Allergies / Sinusitis ?Reports symptoms persisting with increased pollen, sinus symptoms sinus congestion and pressure. ?Admits productive cough. ? ?PreDM ?Improved A1c to 5.8 prior 5.9 to 6.2 in past ?Improved lifestyle and wt loss ?Due for repeat lab ? ? ?Depression screen [facility] 2/9 12/06/2021 09/27/2021 07/05/2021  ?Decreased Interest 1 1 0  ?Down, Depressed, Hopeless 0 0 0  ?PHQ - 2 Score 1 1 0  ?Altered sleeping 2 1 -  ?Tired, decreased energy 1 2 -  ?Change in appetite 1 0 -  ?Feeling bad or failure about yourself  0 0 -  ?Trouble concentrating 0 0 -  ?Moving slowly or fidgety/restless 0 0 -  ?Suicidal thoughts 0 0 -  ?PHQ-9 Score 5 4 -  ?Difficult doing work/chores Not difficult at all Not difficult at all -  ?Some recent data might be hidden  ? ? ?Social History  ? ?Tobacco Use  ? Smoking status: Never  ? Smokeless tobacco: Never  ?Vaping Use  ? Vaping Use: Never used  ?Substance Use Topics  ? Alcohol use: No  ? Drug use: No  ? ? ?Review of Systems ?Per HPI unless specifically indicated above ? ?   ?Objective:  ?  ?BP 130/70   Pulse 67   Ht 5' 6"  (1.676 m)   Wt 269 lb (122 kg)   SpO2 99%   BMI 43.42 kg/m?   ?Wt Readings from Last 3 Encounters:  ?12/06/21 269 lb (122 kg)  ?09/27/21 274 lb 9.6 oz (124.6 kg)  ?07/19/21 272 lb 12.8 oz (123.7 kg)  ?  ?Physical Exam ?Vitals and nursing note reviewed.  ?Constitutional:   ?   General: She is not in acute distress. ?   Appearance: Normal appearance. She is well-developed. She is obese. She is not diaphoretic.  ?   Comments: Well-appearing, comfortable, cooperative  ?HENT:  ?   Head: Normocephalic and atraumatic.  ?Eyes:  ?   General:     ?   Right eye: No discharge.     ?   Left eye: No discharge.  ?   Conjunctiva/sclera: Conjunctivae normal.  ?Cardiovascular:  ?    Rate and Rhythm: Normal rate.  ?Pulmonary:  ?   Effort: Pulmonary effort is normal.  ?Skin: ?   General: Skin is warm and dry.  ?   Findings: No erythema or rash.  ?Neurological:  ?   Mental Status: She is alert and oriented to person, place, and time.  ?Psychiatric:     ?   Mood and Affect: Mood normal.     ?   Behavior: Behavior normal.     ?   Thought Content: Thought content normal.  ?   Comments: Well groomed, good eye contact, normal speech and thoughts  ? ?Results for orders placed or performed in visit on 05/30/21  ?TSH  ?Result Value Ref Range  ? TSH 1.78 0.40 - 4.50 mIU/L  ?Hemoglobin A1c  ?Result Value Ref Range  ? Hgb A1c MFr Bld 5.8 (H) <5.7 % of total Hgb  ? Mean Plasma Glucose 120 mg/dL  ? eAG (mmol/L) 6.6 mmol/L  ?Lipid panel  ?Result Value Ref Range  ? Cholesterol 175 <200 mg/dL  ? HDL 56 > OR = 50 mg/dL  ?  Triglycerides 75 <150 mg/dL  ? LDL Cholesterol (Calc) 103 (H) mg/dL (calc)  ? Total CHOL/HDL Ratio 3.1 <5.0 (calc)  ? Non-HDL Cholesterol (Calc) 119 <130 mg/dL (calc)  ?CBC with Differential/Platelet  ?Result Value Ref Range  ? WBC 4.0 3.8 - 10.8 Thousand/uL  ? RBC 3.79 (L) 3.80 - 5.10 Million/uL  ? Hemoglobin 11.5 (L) 11.7 - 15.5 g/dL  ? HCT 35.8 35.0 - 45.0 %  ? MCV 94.5 80.0 - 100.0 fL  ? MCH 30.3 27.0 - 33.0 pg  ? MCHC 32.1 32.0 - 36.0 g/dL  ? RDW 13.1 11.0 - 15.0 %  ? Platelets 304 140 - 400 Thousand/uL  ? MPV 10.0 7.5 - 12.5 fL  ? Neutro Abs 1,784 1,500 - 7,800 cells/uL  ? Lymphs Abs 1,636 850 - 3,900 cells/uL  ? Absolute Monocytes 300 200 - 950 cells/uL  ? Eosinophils Absolute 220 15 - 500 cells/uL  ? Basophils Absolute 60 0 - 200 cells/uL  ? Neutrophils Relative % 44.6 %  ? Total Lymphocyte 40.9 %  ? Monocytes Relative 7.5 %  ? Eosinophils Relative 5.5 %  ? Basophils Relative 1.5 %  ?COMPLETE METABOLIC PANEL WITH GFR  ?Result Value Ref Range  ? Glucose, Bld 103 (H) 65 - 99 mg/dL  ? BUN 14 7 - 25 mg/dL  ? Creat 0.68 0.60 - 1.00 mg/dL  ? eGFR 90 > OR = 60 mL/min/1.63m  ? BUN/Creatinine  Ratio NOT APPLICABLE 6 - 22 (calc)  ? Sodium 142 135 - 146 mmol/L  ? Potassium 4.1 3.5 - 5.3 mmol/L  ? Chloride 104 98 - 110 mmol/L  ? CO2 31 20 - 32 mmol/L  ? Calcium 9.3 8.6 - 10.4 mg/dL  ? Total Protein 6.5 6.1 - 8.1 g/dL  ? Albumin 4.0 3.6 - 5.1 g/dL  ? Globulin 2.5 1.9 - 3.7 g/dL (calc)  ? AG Ratio 1.6 1.0 - 2.5 (calc)  ? Total Bilirubin 0.3 0.2 - 1.2 mg/dL  ? Alkaline phosphatase (APISO) 68 37 - 153 U/L  ? AST 14 10 - 35 U/L  ? ALT 9 6 - 29 U/L  ? ?   ?Assessment & Plan:  ? ?Problem List Items Addressed This Visit   ? ? Prediabetes  ?  Encourage lifestyle management of PreDM ?A1c due today ?Prior range 5.8 ?Not on medication ?  ?  ? Relevant Orders  ? Hemoglobin A1c  ? ?Other Visit Diagnoses   ? ? Acute non-recurrent frontal sinusitis    -  Primary  ? Relevant Medications  ? azithromycin (ZITHROMAX Z-PAK) 250 MG tablet  ? fluticasone (FLONASE) 50 MCG/ACT nasal spray  ? ?  ?  ?Sinusitis ?Allergic component ?Persistent worse symptoms ?Allergy to PCN ?Will cover with Zpak and start Flonase ? ?Meds ordered this encounter  ?Medications  ? azithromycin (ZITHROMAX Z-PAK) 250 MG tablet  ?  Sig: Take 2 tabs (5049mtotal) on Day 1. Take 1 tab (2505mdaily for next 4 days.  ?  Dispense:  6 tablet  ?  Refill:  0  ? fluticasone (FLONASE) 50 MCG/ACT nasal spray  ?  Sig: Place 2 sprays into both nostrils daily. Use for 4-6 weeks then stop and use seasonally or as needed.  ?  Dispense:  16 g  ?  Refill:  3  ? ? ? ? ?Follow up plan: ?Return in about 6 months (around 06/08/2022) for 6 month fasting lab only then 1 week later Annual Physical. ? ?Future labs due in 6  months ? ?Nobie Putnam, DO ?[facility] ?[facility] ?12/06/2021, 9:51 AM ?

## 2021-12-07 ENCOUNTER — Ambulatory Visit: Payer: Medicare Other | Admitting: Family Medicine

## 2021-12-07 LAB — HEMOGLOBIN A1C
Hgb A1c MFr Bld: 6 % of total Hgb — ABNORMAL HIGH (ref <5.7)
Mean Plasma Glucose: 126 mg/dL
eAG (mmol/L): 7 mmol/L

## 2022-01-10 ENCOUNTER — Encounter (INDEPENDENT_AMBULATORY_CARE_PROVIDER_SITE_OTHER): Payer: Self-pay | Admitting: Vascular Surgery

## 2022-01-10 ENCOUNTER — Ambulatory Visit (INDEPENDENT_AMBULATORY_CARE_PROVIDER_SITE_OTHER): Payer: Medicare Other | Admitting: Vascular Surgery

## 2022-01-10 VITALS — BP 138/85 | HR 77 | Resp 18 | Ht 67.0 in

## 2022-01-10 DIAGNOSIS — I890 Lymphedema, not elsewhere classified: Secondary | ICD-10-CM

## 2022-01-10 DIAGNOSIS — I10 Essential (primary) hypertension: Secondary | ICD-10-CM | POA: Diagnosis not present

## 2022-01-10 NOTE — Progress Notes (Signed)
? ? ?MRN : [id number] ? ?Megan Perez is a 76 y.o. (Dec 21, 1945) female who presents with chief complaint of No chief complaint on file. ?. ? ?History of Present Illness: Patient returns today in follow up of her leg swelling and lymphedema.  Her symptoms are stable to slightly improved from her visit a year ago.  The lymphedema pump and increasing her activity have made a dramatic improvement in her overall swelling from her initial visits.  No new ulceration or infection.  No chest pain or shortness of breath ? ?Current Outpatient Medications  ?Medication Sig Dispense Refill  ? acetaminophen (TYLENOL) 500 MG tablet Take 1,300 mg by mouth every 6 (six) hours as needed.     ? albuterol (VENTOLIN HFA) 108 (90 Base) MCG/ACT inhaler Inhale 2 puffs into the lungs every 4 (four) hours as needed for wheezing or shortness of breath (cough). 1 each 2  ? azelastine (OPTIVAR) 0.05 % ophthalmic solution Place 2 drops into both eyes daily as needed.    ? diclofenac sodium (VOLTAREN) 1 % GEL APPLY 2 GRAMS TOPICALLY FOUR TIMES DAILY AS NEEDED FOR MODERATE PAIN OF BACK AND KNEES 100 g 1  ? fluticasone (FLONASE) 50 MCG/ACT nasal spray Place 2 sprays into both nostrils daily. Use for 4-6 weeks then stop and use seasonally or as needed. 16 g 3  ? lisinopril (ZESTRIL) 20 MG tablet TAKE 1 TABLET(20 MG) BY MOUTH DAILY 90 tablet 1  ? Melatonin 10 MG TABS Take 10 mg by mouth at bedtime.    ? pantoprazole (PROTONIX) 20 MG tablet Take 1 tablet (20 mg total) by mouth daily before breakfast. 90 tablet 1  ? RESTASIS 0.05 % ophthalmic emulsion Place 1 drop into both eyes in the morning and at bedtime. (Patient not taking: Reported on 01/10/2022)    ? sucralfate (CARAFATE) 1 g tablet TAKE 1 TABLET BY MOUTH FOUR TIMES DAILY(WITH MEALS AND AT BEDTIME AS NEEDED) (Patient not taking: Reported on 01/10/2022) 60 tablet 2  ? ?No current facility-administered medications for this visit.  ? ? ?Past Medical History:  ?Diagnosis Date  ? Anxiety   ? Asthma   ?  reactive airway and shortness of breath w/ exertion in hot temperatures  ? Bronchitis   ? GERD (gastroesophageal reflux disease)   ? Hypertension   ? Morbid obesity with BMI of 40.0-44.9, adult (HCC) 04/08/2012  ? OA (osteoarthritis)   ? knee  ? Osteoporosis   ? Prediabetes   ? Sinus infection   ? 10/2018  ? Venous (peripheral) insufficiency 08/19/2010  ? Qualifier: Diagnosis of  By: [name], [place]    ? ? ?Past Surgical History:  ?Procedure Laterality Date  ? ABDOMINAL HYSTERECTOMY    ? CARPAL TUNNEL RELEASE Right   ? CHOLECYSTECTOMY    ? COLONOSCOPY WITH PROPOFOL N/A 06/19/2017  ? Procedure: COLONOSCOPY WITH PROPOFOL;  Surgeon: [name], MD;  Location: [facility] ENDOSCOPY;  Service: Endoscopy;  Laterality: N/A;  ? HEEL SPUR SURGERY    ? REPLACEMENT TOTAL KNEE    ? left  ? REPLACEMENT TOTAL KNEE Right   ? SHOULDER ARTHROSCOPY WITH OPEN ROTATOR CUFF REPAIR Right 09/15/2015  ? Procedure: SHOULDER ARTHROSCOPY WITH OPEN ROTATOR CUFF REPAIR;  Surgeon: [name], MD;  Location: [facility] ORS;  Service: Orthopedics;  Laterality: Right;  ? SHOULDER SURGERY Left   ? ? ? ?Social History  ? ?Tobacco Use  ? Smoking status: Never  ? Smokeless tobacco: Never  ?Vaping Use  ? Vaping Use: Never used  ?  Substance Use Topics  ? Alcohol use: No  ? Drug use: No  ? ? ? ? ?Family History  ?Adopted: Yes  ?Problem Relation Age of Onset  ? Heart disease Father   ? Subarachnoid hemorrhage Mother   ? Hypertension Mother   ? Subarachnoid hemorrhage Sister   ? Hypertension Sister   ? Hypertension Sister   ? Hypertension Sister   ? Healthy Sister   ? Breast cancer Neg Hx   ? ? ? ?Allergies  ?Allergen Reactions  ? Baclofen   ?  Makes her feel Drunk  ? Meclizine   ? Other   ?  Other reaction(s): Other (See Comments) ?Patient notes there is a pain medication that causes hot flushing sensation in her mouth but cannot recall its name.  ? Penicillins   ? Prednisone Nausea And Vomiting  ? Prevacid [Lansoprazole]   ?  Causes dizziness  ? Gabapentin Hives  and Itching  ? Oxycodone Rash  ? ? ?REVIEW OF SYSTEMS (Negative unless checked) ?  ?Constitutional: [] Weight loss  [] Fever  [] Chills ?Cardiac: [] Chest pain   [] Chest pressure   [] Palpitations   [] Shortness of breath when laying flat   [] Shortness of breath at rest   [] Shortness of breath with exertion. ?Vascular:  [x] Pain in legs with walking   [x] Pain in legs at rest   [] Pain in legs when laying flat   [] Claudication   [] Pain in feet when walking  [] Pain in feet at rest  [] Pain in feet when laying flat   [] History of DVT   [] Phlebitis   [x] Swelling in legs   [] Varicose veins   [] Non-healing ulcers ?Pulmonary:   [] Uses home oxygen   [] Productive cough   [] Hemoptysis   [] Wheeze  [] COPD   [] Asthma ?Neurologic:  [] Dizziness  [] Blackouts   [] Seizures   [] History of stroke   [] History of TIA  [] Aphasia   [] Temporary blindness   [] Dysphagia   [] Weakness or numbness in arms   [x] Weakness or numbness in legs ?Musculoskeletal:  [x] Arthritis   [] Joint swelling   [x] Joint pain   [] Low back pain ?Hematologic:  [] Easy bruising  [] Easy bleeding   [] Hypercoagulable state   [] Anemic  [] Hepatitis ?Gastrointestinal:  [] Blood in stool   [] Vomiting blood  [] Gastroesophageal reflux/heartburn   [] Abdominal pain ?Genitourinary:  [] Chronic kidney disease   [] Difficult urination  [] Frequent urination  [] Burning with urination   [] Hematuria ?Skin:  [] Rashes   [] Ulcers   [] Wounds ?Psychological:  [] History of anxiety   []  History of major depression. ?  ? ?Physical Examination ? ?BP 138/85 (BP Location: Right Arm)   Pulse 77   Resp 18   Ht 5\' 7"  (1.702 m)   BMI 42.13 kg/m?  ?Gen:  WD/WN, NAD. obese ?Head: [place]/AT, No temporalis wasting. ?Ear/Nose/Throat: Hearing grossly intact, nares w/o erythema or drainage ?Eyes: Conjunctiva clear. Sclera non-icteric ?Neck: Supple.  Trachea midline ?Pulmonary:  Good air movement, no use of accessory muscles.  ?Cardiac: RRR, no JVD ?Vascular:  ?Vessel Right Left  ?Radial Palpable Palpable  ?    ?    ?     ? ?Musculoskeletal: M/S 5/5 throughout.  No deformity or atrophy. 1-2+ BLE edema. ?Neurologic: Sensation grossly intact in extremities.  Symmetrical.  Speech is fluent.  ?Psychiatric: Judgment intact, Mood & affect appropriate for pt's clinical situation. ?Dermatologic: No rashes or ulcers noted.  No cellulitis or open wounds. ? ? ? ? ? ?Labs ?Recent Results (from the past 2160 hour(s))  ?Hemoglobin A1c  Status: Abnormal  ? Collection Time: 12/06/21 10:15 AM  ?Result Value Ref Range  ? Hgb A1c MFr Bld 6.0 (H) <5.7 % of total Hgb  ?  Comment: For someone without known diabetes, a hemoglobin  ?A1c value between 5.7% and 6.4% is consistent with ?prediabetes and should be confirmed with a  ?follow-up test. ?. ?For someone with known diabetes, a value <7% ?indicates that their diabetes is well controlled. A1c ?targets should be individualized based on duration of ?diabetes, age, comorbid conditions, and other ?considerations. ?. ?This assay result is consistent with an increased risk ?of diabetes. ?. ?Currently, no consensus exists regarding use of ?hemoglobin A1c for diagnosis of diabetes for children. ?. ?  ? Mean Plasma Glucose 126 mg/dL  ? eAG (mmol/L) 7.0 mmol/L  ? ? ?Radiology ?No results found. ? ?Assessment/Plan ?Essential hypertension ?blood pressure control important in reducing the progression of atherosclerotic disease. On appropriate oral medications. ?  ?Lymphedema of both lower extremities ?The lymphedema pump has helped her symptoms and she is overall doing reasonably well. No changes. Recheck in one year ? ? ?Festus BarrenJason Michai Dieppa, MD ? ?01/10/2022 ?1:21 PM ? ? ? ?This note was created with Dragon medical transcription system.  Any errors from dictation are purely unintentional  ?

## 2022-01-21 ENCOUNTER — Other Ambulatory Visit: Payer: Self-pay | Admitting: Family Medicine

## 2022-01-21 DIAGNOSIS — I10 Essential (primary) hypertension: Secondary | ICD-10-CM

## 2022-01-23 NOTE — Telephone Encounter (Signed)
Requested Prescriptions  ?Pending Prescriptions Disp Refills  ?? lisinopril (ZESTRIL) 20 MG tablet [Pharmacy Med Name: LISINOPRIL 20MG  TABLETS] 90 tablet 1  ?  Sig: TAKE 1 TABLET(20 MG) BY MOUTH DAILY  ?  ? Cardiovascular:  ACE Inhibitors Failed - 01/21/2022  7:03 AM  ?  ?  Failed - Cr in normal range and within 180 days  ?  Creat  ?Date Value Ref Range Status  ?05/31/2021 0.68 0.60 - 1.00 mg/dL Final  ?   ?  ?  Failed - K in normal range and within 180 days  ?  Potassium  ?Date Value Ref Range Status  ?05/31/2021 4.1 3.5 - 5.3 mmol/L Final  ?   ?  ?  Passed - Patient is not pregnant  ?  ?  Passed - Last BP in normal range  ?  BP Readings from Last 1 Encounters:  ?01/10/22 138/85  ?   ?  ?  Passed - Valid encounter within last 6 months  ?  Recent Outpatient Visits   ?      ? 1 month ago Acute non-recurrent frontal sinusitis  ? [facility] [place], [place], [place]  ? 3 months ago Mild intermittent asthma with acute exacerbation  ? [facility] [place], [place], [place]  ? 6 months ago Psychophysiological insomnia  ? [facility], DO  ? 7 months ago Annual physical exam  ? [facility], DO  ? 10 months ago Essential hypertension  ? [facility], DO  ?  ?  ?Future Appointments   ?        ? In 5 months [facility], [facility]   ?  ? ?  ?  ?  ? ? ? ?

## 2022-01-31 ENCOUNTER — Other Ambulatory Visit: Payer: Self-pay | Admitting: Family Medicine

## 2022-01-31 DIAGNOSIS — K219 Gastro-esophageal reflux disease without esophagitis: Secondary | ICD-10-CM

## 2022-01-31 NOTE — Telephone Encounter (Signed)
Pt called to report that she will be completely out of her current supply by tomorrow  ?

## 2022-02-01 DIAGNOSIS — I890 Lymphedema, not elsewhere classified: Secondary | ICD-10-CM | POA: Diagnosis not present

## 2022-02-01 DIAGNOSIS — M2042 Other hammer toe(s) (acquired), left foot: Secondary | ICD-10-CM | POA: Diagnosis not present

## 2022-02-01 DIAGNOSIS — M79675 Pain in left toe(s): Secondary | ICD-10-CM | POA: Diagnosis not present

## 2022-02-01 DIAGNOSIS — L600 Ingrowing nail: Secondary | ICD-10-CM | POA: Diagnosis not present

## 2022-02-01 DIAGNOSIS — M79674 Pain in right toe(s): Secondary | ICD-10-CM | POA: Diagnosis not present

## 2022-02-01 DIAGNOSIS — M2041 Other hammer toe(s) (acquired), right foot: Secondary | ICD-10-CM | POA: Diagnosis not present

## 2022-02-01 DIAGNOSIS — B351 Tinea unguium: Secondary | ICD-10-CM | POA: Diagnosis not present

## 2022-02-01 NOTE — Telephone Encounter (Signed)
Requested Prescriptions  ?Pending Prescriptions Disp Refills  ?? pantoprazole (PROTONIX) 20 MG tablet [Pharmacy Med Name: PANTOPRAZOLE 20MG  TABLETS] 90 tablet 1  ?  Sig: TAKE 1 TABLET(20 MG) BY MOUTH DAILY BEFORE BREAKFAST  ?  ? Gastroenterology: Proton Pump Inhibitors Passed - 01/31/2022 10:05 AM  ?  ?  Passed - Valid encounter within last 12 months  ?  Recent Outpatient Visits   ?      ? 1 month ago Acute non-recurrent frontal sinusitis  ? [facility] [place], [place], [place]  ? 4 months ago Mild intermittent asthma with acute exacerbation  ? [facility] [place], [place], [place]  ? 6 months ago Psychophysiological insomnia  ? [facility], DO  ? 7 months ago Annual physical exam  ? [facility], DO  ? 11 months ago Essential hypertension  ? [facility], DO  ?  ?  ?Future Appointments   ?        ? In 5 months [facility], [facility]   ?  ? ?  ?  ?  ? ? ?

## 2022-02-07 ENCOUNTER — Ambulatory Visit (INDEPENDENT_AMBULATORY_CARE_PROVIDER_SITE_OTHER): Payer: Medicare Other | Admitting: Family Medicine

## 2022-02-07 ENCOUNTER — Ambulatory Visit
Admission: RE | Admit: 2022-02-07 | Discharge: 2022-02-07 | Disposition: A | Discharge status: Home / Self Care | Payer: Medicare Other | Source: Ambulatory Visit | Attending: Family Medicine | Admitting: Family Medicine

## 2022-02-07 ENCOUNTER — Ambulatory Visit
Admission: RE | Admit: 2022-02-07 | Discharge: 2022-02-07 | Disposition: A | Discharge status: Home / Self Care | Payer: Medicare Other | Attending: Family Medicine | Admitting: Family Medicine

## 2022-02-07 VITALS — BP 140/84 | HR 83 | Ht 67.0 in | Wt 267.0 lb

## 2022-02-07 DIAGNOSIS — M25562 Pain in left knee: Secondary | ICD-10-CM

## 2022-02-07 DIAGNOSIS — Z96652 Presence of left artificial knee joint: Secondary | ICD-10-CM

## 2022-02-07 DIAGNOSIS — G8929 Other chronic pain: Secondary | ICD-10-CM | POA: Diagnosis not present

## 2022-02-07 DIAGNOSIS — M7989 Other specified soft tissue disorders: Secondary | ICD-10-CM | POA: Diagnosis not present

## 2022-02-07 DIAGNOSIS — S8012XA Contusion of left lower leg, initial encounter: Secondary | ICD-10-CM | POA: Diagnosis not present

## 2022-02-07 DIAGNOSIS — M25462 Effusion, left knee: Secondary | ICD-10-CM | POA: Diagnosis not present

## 2022-02-07 DIAGNOSIS — I890 Lymphedema, not elsewhere classified: Secondary | ICD-10-CM

## 2022-02-07 MED ORDER — TRAMADOL HCL 50 MG PO TABS: 50.0000 mg | ORAL_TABLET | Freq: Two times a day (BID) | ORAL | 0 refills | Status: DC | PRN

## 2022-02-07 NOTE — Progress Notes (Signed)
Subjective:    Patient ID: Megan Perez, female    DOB: Feb 27, 1946, 76 y.o.   MRN: [id number]  Megan Perez is a 76 y.o. female presenting on 02/07/2022 for Fall and Knee Pain   HPI  Left Knee Pain, s/p TKR years ago Lymphedema  Followed by [facility], last seen 01/2022, improved lymphedema and using the pump PRN only.  She does not like to drink regular water. But she tried switching to sparkling flavored water, and feels like something is not quite right. Does not have enough energy. Felt more drained. - She is now mixing her own flavored water and doing better.' Not taking MVI  Previous TKR L side years ago, that Dr is deceased. She had L sided TKR 06-Feb-2009, by Dr Leonarda Salon at [facility] [place]  More recent history R TKR by Dr [facility].  She is having a hard time sleeping due to pain. She takes Tylenol 650mg  x 2 = 1300 Cannot take NSAID. History of GI Bleed. Allergic reaction or intolerance to Gabapentin, PreGabalin, Tizanidine, Baclofen.      12/06/2021    9:46 AM 09/27/2021    8:25 AM 07/05/2021    3:39 PM  Depression screen PHQ 2/9  Decreased Interest 1 1 0  Down, Depressed, Hopeless 0 0 0  PHQ - 2 Score 1 1 0  Altered sleeping 2 1   Tired, decreased energy 1 2   Change in appetite 1 0   Feeling bad or failure about yourself  0 0   Trouble concentrating 0 0   Moving slowly or fidgety/restless 0 0   Suicidal thoughts 0 0   PHQ-9 Score 5 4   Difficult doing work/chores Not difficult at all Not difficult at all     Social History   Tobacco Use   Smoking status: Never   Smokeless tobacco: Never  Vaping Use   Vaping Use: Never used  Substance Use Topics   Alcohol use: No   Drug use: No    Review of Systems Per HPI unless specifically indicated above     Objective:    BP 140/84   Pulse 83   Ht 5\' 7"  (1.702 m)   Wt 267 lb (121.1 kg)   SpO2 97%   BMI 41.82 kg/m   Wt Readings from Last 3 Encounters:   02/07/22 267 lb (121.1 kg)  12/06/21 269 lb (122 kg)  09/27/21 274 lb 9.6 oz (124.6 kg)    Physical Exam Vitals and nursing note reviewed.  Constitutional:      General: She is not in acute distress.    Appearance: Normal appearance. She is well-developed. She is obese. She is not diaphoretic.     Comments: Well-appearing, comfortable, cooperative  HENT:     Head: Normocephalic and atraumatic.  Eyes:     General:        Right eye: No discharge.        Left eye: No discharge.     Conjunctiva/sclera: Conjunctivae normal.  Cardiovascular:     Rate and Rhythm: Normal rate.  Pulmonary:     Effort: Pulmonary effort is normal.  Musculoskeletal:     Right lower leg: Edema present.     Left lower leg: Edema (Lymphedema bilateral, no acute swelling) present.  Skin:    General: Skin is warm and dry.     Findings: Lesion (Left knee inferior aspect with localized soft tissue induration bruising discoloration feels like hematoma formation. mild  discomfort. no effusion. has long since healed incisional L knee scar from TKR >10 years ago) present. No erythema or rash.  Neurological:     Mental Status: She is alert and oriented to person, place, and time.  Psychiatric:        Mood and Affect: Mood normal.        Behavior: Behavior normal.        Thought Content: Thought content normal.     Comments: Well groomed, good eye contact, normal speech and thoughts   Results for orders placed or performed in visit on 12/06/21  Hemoglobin A1c  Result Value Ref Range   Hgb A1c MFr Bld 6.0 (H) <5.7 % of total Hgb   Mean Plasma Glucose 126 mg/dL   eAG (mmol/L) 7.0 mmol/L      Assessment & Plan:   Problem List Items Addressed This Visit     Lymphedema of both lower extremities   History of knee replacement, total (Left)   Relevant Orders   DG Knee Complete 4 Views Left   Other Visit Diagnoses     Pain and swelling of left knee    -  Primary   Relevant Medications   traMADol (ULTRAM) 50 MG  tablet   Other Relevant Orders   DG Knee Complete 4 Views Left   Traumatic hematoma of left lower leg, initial encounter       Relevant Medications   traMADol (ULTRAM) 50 MG tablet   Other Relevant Orders   DG Knee Complete 4 Views Left   Chronic pain of left knee       Relevant Medications   traMADol (ULTRAM) 50 MG tablet       Fall / L Knee injury  S/p TKR in past 2010  L leg / below knee likely Hematoma - collection of old blood/bruise from the injury, use ice packs on the skin to help this resolve and go down, will take time for the body to reabsorb it.  X-ray today on Left knee to check alignment and the replaced joint F/u results.  If need to return to Orthopedics due to Left Knee injury - can return to [facility] - Dr Malka So.  Recommend resume Lymphedema pump to keep swelling at a minimum to reduce pinching nerve symptoms.  New rx Tramadol since cannot take NSAID, Gabapentin, Pregabalin and Muscle Relaxants, see HPI above. Take Tramadol only as needed 50mg  nightly as needed for sleep and pain.  For energy Recommend Multivitamin daily Keep on the water mixture that you have now.  Orders Placed This Encounter  Procedures   DG Knee Complete 4 Views Left    Standing Status:   Future    Number of Occurrences:   1    Standing Expiration Date:   02/08/2023    Order Specific Question:   Reason for Exam (SYMPTOM  OR DIAGNOSIS REQUIRED)    Answer:   fall on left knee 4/29 with pain and swelling, hematoma    Order Specific Question:   Preferred imaging location?    Answer:   [facility] 5/29     Meds ordered this encounter  Medications   traMADol (ULTRAM) 50 MG tablet    Sig: Take 1 tablet (50 mg total) by mouth every 12 (twelve) hours as needed for moderate pain.    Dispense:  20 tablet    Refill:  0      Follow up plan: Return if symptoms worsen or fail to improve.   Cheree Ditto,  DO [facility] 02/07/2022, 10:24 AM

## 2022-02-07 NOTE — Patient Instructions (Addendum)
Thank you for coming to the office today.  Recommend Multivitamin daily  Keep on the water mixture that you have now.  Hematoma - collection of old blood/bruise from the injury, use ice packs on the skin to help this resolve and go down, will take time for the body to reabsorb it.  X-ray today on Left knee to check alignment and the replaced joint  If need to return to Orthopedics due to Left Knee injury - can return to [facility] - Dr Malka So.  Recommend resume Lymphedema pump to keep swelling at a minimum to reduce pinching nerve symptoms.  Take Tramadol only as needed 50mg  nightly as needed for sleep and pain.  Use RICE therapy: - R - Rest / relative rest with activity modification avoid overuse of joint - I - Ice packs (make sure you use a towel or sock / something to protect skin) - C - Compression with ACE wrap to apply pressure and reduce swelling allowing more support - E - Elevation - if significant swelling, lift leg above heart level (toes above your nose) to help reduce swelling, most helpful at night after day of being on your feet  You may want to go ahead and call to schedule w/ Orthopedics  [facility]   [address]   [place], [place]   [phone]   [place], [phone], MD   [address]   [facility] [place], [place]   [phone] (Work)      Please schedule a Follow-up Appointment to: Return if symptoms worsen or fail to improve.  If you have any other questions or concerns, please feel free to call the office or send a message through MyChart. You may also schedule an earlier appointment if necessary.  Additionally, you may be receiving a survey about your experience at our office within a few days to 1 week by e-mail or mail. We value your feedback.  [phone], DO [facility], [facility]

## 2022-02-08 ENCOUNTER — Encounter: Payer: Self-pay | Admitting: Family Medicine

## 2022-04-12 ENCOUNTER — Encounter: Payer: Self-pay | Admitting: Family Medicine

## 2022-04-12 ENCOUNTER — Ambulatory Visit (INDEPENDENT_AMBULATORY_CARE_PROVIDER_SITE_OTHER): Payer: Medicare Other | Admitting: Family Medicine

## 2022-04-12 VITALS — BP 135/74 | HR 77 | Ht 67.0 in | Wt 269.4 lb

## 2022-04-12 DIAGNOSIS — G8929 Other chronic pain: Secondary | ICD-10-CM

## 2022-04-12 DIAGNOSIS — Z1231 Encounter for screening mammogram for malignant neoplasm of breast: Secondary | ICD-10-CM | POA: Diagnosis not present

## 2022-04-12 DIAGNOSIS — M545 Low back pain, unspecified: Secondary | ICD-10-CM

## 2022-04-12 DIAGNOSIS — M5136 Other intervertebral disc degeneration, lumbar region: Secondary | ICD-10-CM | POA: Diagnosis not present

## 2022-04-12 DIAGNOSIS — M25562 Pain in left knee: Secondary | ICD-10-CM

## 2022-04-12 DIAGNOSIS — G894 Chronic pain syndrome: Secondary | ICD-10-CM | POA: Diagnosis not present

## 2022-04-12 DIAGNOSIS — M25462 Effusion, left knee: Secondary | ICD-10-CM | POA: Diagnosis not present

## 2022-04-12 MED ORDER — TRAMADOL HCL 50 MG PO TABS: 50.0000 mg | ORAL_TABLET | ORAL | 2 refills | Status: DC | PRN

## 2022-04-12 NOTE — Addendum Note (Signed)
Addended by: Smitty Cords on: 04/12/2022 12:54 PM   Modules accepted: Level of Service

## 2022-04-12 NOTE — Patient Instructions (Addendum)
Thank you for coming to the office today.  Tramadol ordered use as needed.  For Mammogram screening for breast cancer   Call the Imaging Center below anytime to schedule your own appointment now that order has been placed.  [facility] [address] [place], [place] [address] Phone: [phone]  ----------  Loyola Mast  [facility] [address] [facility] Office Building [facility] Second Floor [place], [place]  [address] Appointments: [phone]  Ask if they can do the Epidural Spinal Injection and if they use sedation or anesthesia. Or is it only local numbing.  Check network.   [facility] [address] [place], [place] [address] Phone: [phone]  ----------------------  [facility] in [place]) can do epidural injections but they only do local numbing.  Dr Ronita Hipps  [facility] [address] [place], [place] [address] Ph: [phone]   CHRONIC PAIN MANAGEMENT  [facility] Kathryne Sharper Ph: [phone]     Please schedule a Follow-up Appointment to: Return if symptoms worsen or fail to improve.  If you have any other questions or concerns, please feel free to call the office or send a message through MyChart. You may also schedule an earlier appointment if necessary.  Additionally, you may be receiving a survey about your experience at our office within a few days to 1 week by e-mail or mail. We value your feedback.  Saralyn Pilar, DO [facility], [place]

## 2022-04-12 NOTE — Progress Notes (Signed)
Subjective:    Patient ID: Megan Perez, female    DOB: [birth year], 76 y.o.   MRN: [id number]  Megan Perez is a 76 y.o. female presenting on 04/12/2022 for Back Pain   HPI  Chronic Pain Lumbar DDD Chronic Back Pain Osteoarthritis multiple joints, chronic knee pain S/p TKR  Previously followed by Pain Management [facility] Dr Laban Emperor She does not want to pursue higher dose pain meds or procedures / surgery, she has tried injections without significant relief. The sedative / numbing without improvement. She is interested in new pain management location, local if possible. Prevous order on Tramadol only 8 pills covered, asking about re order.  Previous TKR L side years ago, that Dr is deceased. She had L sided TKR 02-08-2009, by Dr Leonarda Salon at [facility], [place]   More recent history R TKR by Dr [facility].   She is having a hard time sleeping due to pain. She takes Tylenol 650mg  x 2 = 1300 Cannot take NSAID. History of GI Bleed. Allergic reaction or intolerance to Gabapentin, PreGabalin, Tizanidine, Baclofen.  Due for Mammogram      04/12/2022   11:15 AM 12/06/2021    9:46 AM 09/27/2021    8:25 AM  Depression screen PHQ 2/9  Decreased Interest 1 1 1   Down, Depressed, Hopeless 0 0 0  PHQ - 2 Score 1 1 1   Altered sleeping 2 2 1   Tired, decreased energy 1 1 2   Change in appetite 1 1 0  Feeling bad or failure about yourself  0 0 0  Trouble concentrating 0 0 0  Moving slowly or fidgety/restless 0 0 0  Suicidal thoughts 0 0 0  PHQ-9 Score 5 5 4   Difficult doing work/chores Not difficult at all Not difficult at all Not difficult at all    Social History   Tobacco Use   Smoking status: Never   Smokeless tobacco: Never  Vaping Use   Vaping Use: Never used  Substance Use Topics   Alcohol use: No   Drug use: No    Review of Systems Per HPI unless specifically indicated above     Objective:    BP 135/74   Pulse 77   Ht 5\' 7"   (1.702 m)   Wt 269 lb 6.4 oz (122.2 kg)   SpO2 96%   BMI 42.19 kg/m   Wt Readings from Last 3 Encounters:  04/12/22 269 lb 6.4 oz (122.2 kg)  02/07/22 267 lb (121.1 kg)  12/06/21 269 lb (122 kg)    Physical Exam Vitals and nursing note reviewed.  Constitutional:      General: She is not in acute distress.    Appearance: She is well-developed. She is obese. She is not diaphoretic.     Comments: Well-appearing, comfortable, cooperative  HENT:     Head: Normocephalic and atraumatic.  Eyes:     General:        Right eye: No discharge.        Left eye: No discharge.     Conjunctiva/sclera: Conjunctivae normal.  Neck:     Thyroid: No thyromegaly.  Cardiovascular:     Rate and Rhythm: Normal rate and regular rhythm.     Heart sounds: Normal heart sounds. No murmur heard. Pulmonary:     Effort: Pulmonary effort is normal. No respiratory distress.     Breath sounds: Normal breath sounds. No wheezing or rales.  Musculoskeletal:        General: Normal  range of motion.     Cervical back: Normal range of motion and neck supple.  Lymphadenopathy:     Cervical: No cervical adenopathy.  Skin:    General: Skin is warm and dry.     Findings: No erythema or rash.  Neurological:     Mental Status: She is alert and oriented to person, place, and time.  Psychiatric:        Behavior: Behavior normal.     Comments: Well groomed, good eye contact, normal speech and thoughts    Results for orders placed or performed in visit on 12/06/21  Hemoglobin A1c  Result Value Ref Range   Hgb A1c MFr Bld 6.0 (H) <5.7 % of total Hgb   Mean Plasma Glucose 126 mg/dL   eAG (mmol/L) 7.0 mmol/L      Assessment & Plan:   Problem List Items Addressed This Visit     Chronic pain syndrome - Primary (Chronic)   Relevant Medications   traMADol (ULTRAM) 50 MG tablet   DDD (degenerative disc disease), lumbar   Relevant Medications   traMADol (ULTRAM) 50 MG tablet   Other Visit Diagnoses     Encounter  for screening mammogram for malignant neoplasm of breast       Relevant Orders   MM 3D SCREEN BREAST BILATERAL   Pain and swelling of left knee       Relevant Medications   traMADol (ULTRAM) 50 MG tablet   Chronic pain of left knee       Relevant Medications   traMADol (ULTRAM) 50 MG tablet   Chronic midline low back pain without sciatica       Relevant Medications   traMADol (ULTRAM) 50 MG tablet       Chronic Pain Will offer options for other referral to Pain Management, she is interested in [facility] and other therapy with medication management. Handout AVS, she will check network and available options notify us when ready for referral  I will agree to manage her Tramadol in meantime, limited by coverage by insurance but adjusted order and sent to pharmacy, if need further dose adjust we can review.  Tramadol has been effective, and safer since she cannot take NSAIDs.  Mammogram ordered, she will call to schedule  Orders Placed This Encounter  Procedures   MM 3D SCREEN BREAST BILATERAL    Standing Status:   Future    Standing Expiration Date:   04/13/2023    Order Specific Question:   Reason for Exam (SYMPTOM  OR DIAGNOSIS REQUIRED)    Answer:   Screening bilateral 3D Mammogram Tomo    Order Specific Question:   Preferred imaging location?    Answer:   [facility]     Meds ordered this encounter  Medications   traMADol (ULTRAM) 50 MG tablet    Sig: Take 1 tablet (50 mg total) by mouth every 4 (four) hours as needed for moderate pain.    Dispense:  30 tablet    Refill:  2      Follow up plan: Return if symptoms worsen or fail to improve.  Saralyn Pilar, DO [facility] 04/12/2022, 11:21 AM

## 2022-05-09 ENCOUNTER — Ambulatory Visit
Admission: RE | Admit: 2022-05-09 | Discharge: 2022-05-09 | Disposition: A | Discharge status: Home / Self Care | Payer: Medicare Other | Source: Ambulatory Visit | Attending: Family Medicine | Admitting: Family Medicine

## 2022-05-09 DIAGNOSIS — Z1231 Encounter for screening mammogram for malignant neoplasm of breast: Secondary | ICD-10-CM | POA: Insufficient documentation

## 2022-06-02 ENCOUNTER — Telehealth: Payer: Self-pay | Admitting: Family Medicine

## 2022-06-02 DIAGNOSIS — M25462 Effusion, left knee: Secondary | ICD-10-CM

## 2022-06-02 DIAGNOSIS — G8929 Other chronic pain: Secondary | ICD-10-CM

## 2022-06-02 MED ORDER — TRAMADOL HCL 50 MG PO TABS: 50.0000 mg | ORAL_TABLET | ORAL | 2 refills | Status: DC | PRN

## 2022-06-02 NOTE — Telephone Encounter (Signed)
Medication Refill - Medication: traMADol (ULTRAM) 50 MG tablet  Has the patient contacted their pharmacy? No, Patient states this was a trial and she wanted to inform PCP medication is not making her feel dizzy and she would like to continue medication. Patient has 4 pills left. Patient is going away today and would like to know if PCP can send in prescription  today.     Preferred Pharmacy (with phone number or street name):  [id number] - [place], [place] - [address] AT [facility] OF [address]  [address], [place] [address]   Has the patient been seen for an appointment in the last year OR does the patient have an upcoming appointment? Yes.    Agent: Please be advised that RX refills may take up to 3 business days. We ask that you follow-up with your pharmacy.

## 2022-06-02 NOTE — Telephone Encounter (Signed)
Okay, thank you. I have ordered Tramadol.  Saralyn Pilar, DO [facility] 06/02/2022, 12:46 PM

## 2022-06-26 ENCOUNTER — Ambulatory Visit: Payer: Self-pay

## 2022-06-26 NOTE — Telephone Encounter (Signed)
  Chief Complaint: Rt hand pain. Unable to straighten ring finger Symptoms: ibid Frequency: 2 weeks Pertinent Negatives: Patient denies fever or redness Disposition: [] ED /[x] Urgent Care (no appt availability in office) / [x] Appointment(In office/virtual)/ []  [facility] [facility]/ [] Home Care/ [] Refused Recommended Disposition /[] [facility] [facility]/ []  Follow-up with PCP Additional Notes: Per protocol pt to be seen within 4 hours. Pt will go to UC or ed if redness or fever develops. Pt states that pain started about 2 weeks ago.   Reason for Disposition  [1] SEVERE pain (e.g., excruciating, unable to use hand at all) AND [2] not improved after 2 hours of pain medicine  Answer Assessment - Initial Assessment Questions 1. ONSET: "When did the pain start?"     2 weeks 2. LOCATION: "Where is the pain located?"     Right hand ring finger 3. PAIN: "How bad is the pain?" (Scale 1-10; or mild, moderate, severe)   - MILD (1-3): doesn't interfere with normal activities   - MODERATE (4-7): interferes with normal activities (e.g., work or school) or awakens from sleep   - SEVERE (8-10): excruciating pain, unable to use hand at all     8/10 4. WORK OR EXERCISE: "Has there been any recent work or exercise that involved this part (i.e., hand or wrist) of the body?"     no 5. CAUSE: "What do you think is causing the pain?"     unsure 6. AGGRAVATING FACTORS: "What makes the pain worse?" (e.g., using computer)     Bending it makes it worse. 7. OTHER SYMPTOMS: "Do you have any other symptoms?" (e.g., neck pain, swelling, rash, numbness, fever)     Small knot inside of finger. 8. PREGNANCY: "Is there any chance you are pregnant?" "When was your last menstrual period?"     no  Protocols used: Hand and Wrist Pain-A-AH

## 2022-06-27 ENCOUNTER — Encounter: Payer: Self-pay | Admitting: Internal Medicine

## 2022-06-27 ENCOUNTER — Ambulatory Visit
Admission: RE | Admit: 2022-06-27 | Discharge: 2022-06-27 | Disposition: A | Discharge status: Home / Self Care | Payer: Medicare Other | Source: Ambulatory Visit | Attending: Internal Medicine | Admitting: Internal Medicine

## 2022-06-27 ENCOUNTER — Ambulatory Visit (INDEPENDENT_AMBULATORY_CARE_PROVIDER_SITE_OTHER): Payer: Medicare Other | Admitting: Internal Medicine

## 2022-06-27 ENCOUNTER — Ambulatory Visit: Payer: Medicare Other | Admitting: Family Medicine

## 2022-06-27 ENCOUNTER — Ambulatory Visit
Admission: RE | Admit: 2022-06-27 | Discharge: 2022-06-27 | Disposition: A | Discharge status: Home / Self Care | Payer: Medicare Other | Attending: Internal Medicine | Admitting: Internal Medicine

## 2022-06-27 VITALS — BP 146/76 | HR 66 | Temp 96.8°F | Wt 268.0 lb

## 2022-06-27 DIAGNOSIS — M65341 Trigger finger, right ring finger: Secondary | ICD-10-CM | POA: Insufficient documentation

## 2022-06-27 DIAGNOSIS — Z23 Encounter for immunization: Secondary | ICD-10-CM | POA: Diagnosis not present

## 2022-06-27 DIAGNOSIS — M7989 Other specified soft tissue disorders: Secondary | ICD-10-CM | POA: Diagnosis not present

## 2022-06-27 NOTE — Patient Instructions (Signed)
Trigger Finger  Trigger finger, also called stenosing tenosynovitis,  is a condition that causes a finger to get stuck in a bent position. Each finger has a tendon, which is a tough, cord-like tissue that connects muscle to bone, and each tendon passes through a tunnel of tissue called a tendon sheath. To move your finger, your tendon needs to glide freely through the sheath. Trigger finger happens when the tendon or the sheath thickens, making it difficult to move your finger. Trigger finger can affect any finger or a thumb. It may affect more than one finger. Mild cases may clear up with rest and medicine. Severe cases require more treatment. What are the causes? Trigger finger is caused by a thickened finger tendon or tendon sheath. The cause of this thickening is not known. What increases the risk? The following factors may make you more likely to develop this condition: Doing activities that require a strong grip. Having rheumatoid arthritis, gout, or diabetes. Being 40-60 years old. Being female. What are the signs or symptoms? Symptoms of this condition include: Pain when bending or straightening your finger. Tenderness or swelling where your finger attaches to the palm of your hand. A lump in the palm of your hand or on the inside of your finger. Hearing a noise like a pop or a snap when you try to straighten your finger. Feeling a catching or locking sensation when you try to straighten your finger. Being unable to straighten your finger. How is this diagnosed? This condition is diagnosed based on your symptoms and a physical exam. How is this treated? This condition may be treated by: Resting your finger and avoiding activities that make symptoms worse. Wearing a finger splint to keep your finger extended. Taking NSAIDs, such as ibuprofen, to relieve pain and swelling. Doing gentle exercises to stretch the finger as told by your health care provider. Having medicine that reduces  swelling and inflammation (steroids) injected into the tendon sheath. Injections may need to be repeated. Having surgery to open the tendon sheath. This may be done if other treatments do not work and you cannot straighten your finger. You may need physical therapy after surgery. Follow these instructions at home: If you have a splint: Wear the splint as told by your health care provider. Remove it only as told by your health care provider. Loosen it if your fingers tingle, become numb, or turn cold and blue. Keep it clean. If the splint is not waterproof: Do not let it get wet. Cover it with a watertight covering when you take a bath or shower. Managing pain, stiffness, and swelling     If directed, apply heat to the affected area as often as told by your health care provider. Use the heat source that your health care provider recommends, such as a moist heat pack or a heating pad. Place a towel between your skin and the heat source. Leave the heat on for 20-30 minutes. Remove the heat if your skin turns bright red. This is especially important if you are unable to feel pain, heat, or cold. You may have a greater risk of getting burned. If directed, put ice on the painful area. To do this: If you have a removable splint, remove it as told by your health care provider. Put ice in a plastic bag. Place a towel between your skin and the bag or between your splint and the bag. Leave the ice on for 20 minutes, 2-3 times a day.  Activity Rest   your finger as told by your health care provider. Avoid activities that make the pain worse. Return to your normal activities as told by your health care provider. Ask your health care provider what activities are safe for you. Do exercises as told by your health care provider. Ask your health care provider when it is safe to drive if you have a splint on your hand. General instructions Take over-the-counter and prescription medicines only as told by  your health care provider. Keep all follow-up visits as told by your health care provider. This is important. Contact a health care provider if: Your symptoms are not improving with home care. Summary Trigger finger, also called stenosing tenosynovitis, causes your finger to get stuck in a bent position. This can make it difficult and painful to straighten your finger. This condition develops when a finger tendon or tendon sheath thickens. Treatment may include resting your finger, wearing a splint, and taking medicines. In severe cases, surgery to open the tendon sheath may be needed. This information is not intended to replace advice given to you by your health care provider. Make sure you discuss any questions you have with your health care provider. Document Revised: 01/20/2019 Document Reviewed: 01/20/2019 Elsevier Patient Education  2023 Elsevier Inc.  

## 2022-06-27 NOTE — Progress Notes (Signed)
Subjective:    Patient ID: Megan Perez, female    DOB: 12/28/1945, 76 y.o.   MRN: [id number]  HPI  Patient presents to clinic today with complaint of locking and pain of her right ring finger.  She noticed this a few weeks ago.  She reports when the finger locks, it causes severe pain trying to get it to unlock.  She reports popping of the joint of her ring finger.  She has noticed some swelling.  She denies numbness or tingling.  She has not tried anything OTC for this.  Review of Systems     Past Medical History:  Diagnosis Date   Anxiety    Asthma    reactive airway and shortness of breath w/ exertion in hot temperatures   Bronchitis    GERD (gastroesophageal reflux disease)    Hypertension    Morbid obesity with BMI of 40.0-44.9, adult (HCC) 04/08/2012   OA (osteoarthritis)    knee   Osteoporosis    Prediabetes    Sinus infection    10/2018   Venous (peripheral) insufficiency 08/19/2010   Qualifier: Diagnosis of  By: Wynetta Emery MD, [place]      Current Outpatient Medications  Medication Sig Dispense Refill   acetaminophen (TYLENOL) 500 MG tablet Take 1,300 mg by mouth every 6 (six) hours as needed.      albuterol (VENTOLIN HFA) 108 (90 Base) MCG/ACT inhaler Inhale 2 puffs into the lungs every 4 (four) hours as needed for wheezing or shortness of breath (cough). 1 each 2   azelastine (OPTIVAR) 0.05 % ophthalmic solution Place 2 drops into both eyes daily as needed.     diclofenac sodium (VOLTAREN) 1 % GEL APPLY 2 GRAMS TOPICALLY FOUR TIMES DAILY AS NEEDED FOR MODERATE PAIN OF BACK AND KNEES 100 g 1   fluticasone (FLONASE) 50 MCG/ACT nasal spray Place 2 sprays into both nostrils daily. Use for 4-6 weeks then stop and use seasonally or as needed. 16 g 3   lisinopril (ZESTRIL) 20 MG tablet TAKE 1 TABLET(20 MG) BY MOUTH DAILY 90 tablet 1   Melatonin 10 MG TABS Take 10 mg by mouth at bedtime.     pantoprazole (PROTONIX) 20 MG tablet TAKE 1 TABLET(20 MG) BY MOUTH DAILY BEFORE  BREAKFAST 90 tablet 1   RESTASIS 0.05 % ophthalmic emulsion Place 1 drop into both eyes in the morning and at bedtime.     traMADol (ULTRAM) 50 MG tablet Take 1 tablet (50 mg total) by mouth every 4 (four) hours as needed for moderate pain. 30 tablet 2   No current facility-administered medications for this visit.    Allergies  Allergen Reactions   Baclofen     Makes her feel Drunk   Meclizine    Other     Other reaction(s): Other (See Comments) Patient notes there is a pain medication that causes hot flushing sensation in her mouth but cannot recall its name.   Penicillins    Prednisone Nausea And Vomiting   Prevacid [Lansoprazole]     Causes dizziness   Gabapentin Hives and Itching   Oxycodone Rash    Family History  Adopted: Yes  Problem Relation Age of Onset   Heart disease Father    Subarachnoid hemorrhage Mother    Hypertension Mother    Subarachnoid hemorrhage Sister    Hypertension Sister    Hypertension Sister    Hypertension Sister    Healthy Sister    Breast cancer Neg Hx  Social History   Socioeconomic History   Marital status: Widowed    Spouse name: Not on file   Number of children: Not on file   Years of education: Not on file   Highest education level: Associate degree: academic program  Occupational History   Occupation: retired  Tobacco Use   Smoking status: Never   Smokeless tobacco: Never  Vaping Use   Vaping Use: Never used  Substance and Sexual Activity   Alcohol use: No   Drug use: No   Sexual activity: Not Currently    Birth control/protection: Surgical  Other Topics Concern   Not on file  Social History Narrative   Retired.  Lives alone   Social Determinants of Health   Financial Resource Strain: Medium Risk (07/05/2021)   Overall Financial Resource Strain (CARDIA)    Difficulty of Paying Living Expenses: Somewhat hard  Food Insecurity: No Food Insecurity (07/05/2021)   Hunger Vital Sign    Worried About Running Out of  Food in the Last Year: Never true    Ran Out of Food in the Last Year: Never true  Transportation Needs: No Transportation Needs (07/05/2021)   PRAPARE - Administrator, Civil Service (Medical): No    Lack of Transportation (Non-Medical): No  Physical Activity: Inactive (07/05/2021)   Exercise Vital Sign    Days of Exercise per Week: 0 days    Minutes of Exercise per Session: 0 min  Stress: No Stress Concern Present (07/05/2021)   [employer] of Occupational Health - Occupational Stress Questionnaire    Feeling of Stress : Not at all  Social Connections: Somewhat Isolated (04/23/2018)   Social Connection and Isolation Panel [NHANES]    Frequency of Communication with Friends and Family: More than three times a week    Frequency of Social Gatherings with Friends and Family: More than three times a week    Attends Religious Services: More than 4 times per year    Active Member of [employer] or Organizations: No    Attends Banker Meetings: Never    Marital Status: Widowed  Intimate Partner Violence: Not At Risk (04/23/2018)   Humiliation, Afraid, Rape, and Kick questionnaire    Fear of Current or Ex-Partner: No    Emotionally Abused: No    Physically Abused: No    Sexually Abused: No     Constitutional: Denies fever, malaise, fatigue, headache or abrupt weight changes.  Respiratory: Denies difficulty breathing, shortness of breath, cough or sputum production.   Cardiovascular: Denies chest pain, chest tightness, palpitations or swelling in the hands or feet.  Musculoskeletal: Patient reports locking and swelling of her right ring finger.  Denies decrease in range of motion, difficulty with gait, muscle pain.  Skin: Denies redness, rashes, or ulcercations.  Neurological: Denies numbness, tingling, weakness or problems with coordination.    No other specific complaints in a complete review of systems (except as listed in HPI above).  Objective:   Physical  Exam   BP (!) 146/76 (BP Location: Right Wrist, Patient Position: Sitting, Cuff Size: Normal)   Pulse 66   Temp (!) 96.8 F (36 C) (Temporal)   Wt 268 lb (121.6 kg)   SpO2 100%   BMI 41.97 kg/m   Wt Readings from Last 3 Encounters:  04/12/22 269 lb 6.4 oz (122.2 kg)  02/07/22 267 lb (121.1 kg)  12/06/21 269 lb (122 kg)    General: Appears her stated age, obese, in NAD. Cardiovascular: Normal rate and  rhythm.  Radial pulse 2+ on the right. Pulmonary/Chest: Normal effort and positive vesicular breath sounds. No respiratory distress. No wheezes, rales or ronchi noted.  Musculoskeletal: Normal flexion of the right ring finger.  She does have popping and locking of the right PIP.  No joint swelling appreciated.  Handgrips equal. Neurological: Alert and oriented.   BMET    Component Value Date/Time   NA 142 05/31/2021 0926   K 4.1 05/31/2021 0926   CL 104 05/31/2021 0926   CO2 31 05/31/2021 0926   GLUCOSE 103 (H) 05/31/2021 0926   BUN 14 05/31/2021 0926   BUN 21 04/13/2017 0000   CREATININE 0.68 05/31/2021 0926   CALCIUM 9.3 05/31/2021 0926   GFRNONAA 78 06/03/2020 0758   GFRAA 90 06/03/2020 0758    Lipid Panel     Component Value Date/Time   CHOL 175 05/31/2021 0926   TRIG 75 05/31/2021 0926   HDL 56 05/31/2021 0926   CHOLHDL 3.1 05/31/2021 0926   VLDL 10 01/16/2017 1055   LDLCALC 103 (H) 05/31/2021 0926    CBC    Component Value Date/Time   WBC 4.0 05/31/2021 0926   RBC 3.79 (L) 05/31/2021 0926   HGB 11.5 (L) 05/31/2021 0926   HCT 35.8 05/31/2021 0926   PLT 304 05/31/2021 0926   MCV 94.5 05/31/2021 0926   MCH 30.3 05/31/2021 0926   MCHC 32.1 05/31/2021 0926   RDW 13.1 05/31/2021 0926   LYMPHSABS 1,636 05/31/2021 0926   MONOABS 294 01/16/2017 1055   EOSABS 220 05/31/2021 0926   BASOSABS 60 05/31/2021 0926    Hgb A1C Lab Results  Component Value Date   HGBA1C 6.0 (H) 12/06/2021           Assessment & Plan:   Trigger Finger, Right Ring  Finger:  She is requesting x-ray of the right hand today Encourage frequent range of motion exercises of the right hand Can take Ibuprofen 200 mg every 8 hours as needed for pain and inflammation Referral to hand specialist for further evaluation and treatment   Follow-up with your PCP as previously scheduled. Nicki Reaper, NP

## 2022-07-03 ENCOUNTER — Telehealth: Payer: Self-pay

## 2022-07-03 DIAGNOSIS — M65341 Trigger finger, right ring finger: Secondary | ICD-10-CM

## 2022-07-03 NOTE — Telephone Encounter (Signed)
Patient was seen by Webb Silversmith, FNP, and she has offered to proceed w/ referral. I will route back to her to finish the referral orders with her treatment plan.  Megan Perez, [facility] 07/03/2022, 5:53 PM

## 2022-07-03 NOTE — Telephone Encounter (Signed)
Pt returned call. Shared provider's note.   Pt would like referral to hand specialist.  The x-ray of your right hand is normal.  Would you like a referral to the hand specialist regarding the trigger finger?  Written by Jearld Fenton, NP on 06/30/2022  7:39 AM EDT

## 2022-07-04 ENCOUNTER — Ambulatory Visit: Payer: Medicare Other | Admitting: Family Medicine

## 2022-07-04 NOTE — Telephone Encounter (Signed)
Referral placed.

## 2022-07-04 NOTE — Addendum Note (Signed)
Addended by: Jearld Fenton on: 07/04/2022 08:48 AM   Modules accepted: Orders

## 2022-07-07 DIAGNOSIS — M65341 Trigger finger, right ring finger: Secondary | ICD-10-CM | POA: Diagnosis not present

## 2022-07-07 DIAGNOSIS — M65331 Trigger finger, right middle finger: Secondary | ICD-10-CM | POA: Diagnosis not present

## 2022-07-11 ENCOUNTER — Ambulatory Visit: Payer: Medicare Other

## 2022-07-14 ENCOUNTER — Ambulatory Visit: Payer: Medicare Other

## 2022-07-17 ENCOUNTER — Encounter (INDEPENDENT_AMBULATORY_CARE_PROVIDER_SITE_OTHER): Payer: Self-pay

## 2022-07-26 ENCOUNTER — Other Ambulatory Visit: Payer: Self-pay | Admitting: Family Medicine

## 2022-07-26 DIAGNOSIS — I10 Essential (primary) hypertension: Secondary | ICD-10-CM

## 2022-07-26 NOTE — Telephone Encounter (Signed)
Requested Prescriptions  Pending Prescriptions Disp Refills   lisinopril (ZESTRIL) 20 MG tablet [Pharmacy Med Name: LISINOPRIL 20MG  TABLETS] 90 tablet 0    Sig: TAKE 1 TABLET(20 MG) BY MOUTH DAILY     Cardiovascular:  ACE Inhibitors Failed - 07/26/2022  7:04 AM      Failed - Cr in normal range and within 180 days    Creat  Date Value Ref Range Status  05/31/2021 0.68 0.60 - 1.00 mg/dL Final         Failed - K in normal range and within 180 days    Potassium  Date Value Ref Range Status  05/31/2021 4.1 3.5 - 5.3 mmol/L Final         Failed - Last BP in normal range    BP Readings from Last 1 Encounters:  06/27/22 (!) 146/76         Passed - Patient is not pregnant      Passed - Valid encounter within last 6 months    Recent Outpatient Visits           4 weeks ago Trigger finger, right ring finger   [facility] [place], [place], NP   3 months ago Chronic pain syndrome   [facility] [place], [place], DO   5 months ago Pain and swelling of left knee   [facility] [place], [place], DO   7 months ago Acute non-recurrent frontal sinusitis   [facility] [place], [place], DO   10 months ago Mild intermittent asthma with acute exacerbation   [facility] [place], [place], DO       Future Appointments             In 2 days [facility], [facility]

## 2022-07-28 ENCOUNTER — Ambulatory Visit (INDEPENDENT_AMBULATORY_CARE_PROVIDER_SITE_OTHER): Payer: Medicare Other

## 2022-07-28 ENCOUNTER — Other Ambulatory Visit: Payer: Self-pay

## 2022-07-28 VITALS — Ht 67.0 in | Wt 261.0 lb

## 2022-07-28 DIAGNOSIS — I10 Essential (primary) hypertension: Secondary | ICD-10-CM

## 2022-07-28 DIAGNOSIS — R7303 Prediabetes: Secondary | ICD-10-CM

## 2022-07-28 DIAGNOSIS — Z0000 Encounter for general adult medical examination without abnormal findings: Secondary | ICD-10-CM | POA: Diagnosis not present

## 2022-07-28 DIAGNOSIS — E7800 Pure hypercholesterolemia, unspecified: Secondary | ICD-10-CM

## 2022-07-28 NOTE — Patient Instructions (Signed)
Megan Perez , Thank you for taking time to come for your Medicare Wellness Visit. I appreciate your ongoing commitment to your health goals. Please review the following plan we discussed and let me know if I can assist you in the future.   Screening recommendations/referrals: Colonoscopy: 06/19/17 Mammogram: 05/09/22 Bone Density: declined referral Recommended yearly ophthalmology/optometry visit for glaucoma screening and checkup Recommended yearly dental visit for hygiene and checkup  Vaccinations: Influenza vaccine: 06/27/22 Pneumococcal vaccine: 06/22/15 Tdap vaccine: 09/19/07 Shingles vaccine: Shingrix 03/14/18, 07/31/18   Covid-19:11/15/19, 12/13/19, 07/14/20, 01/15/21, 06/14/21  Advanced directives: no  Conditions/risks identified: none  Next appointment: Follow up in one year for your annual wellness visit    Preventive Care 65 Years and Older, Female Preventive care refers to lifestyle choices and visits with your health care provider that can promote health and wellness. What does preventive care include? A yearly physical exam. This is also called an annual well check. Dental exams once or twice a year. Routine eye exams. Ask your health care provider how often you should have your eyes checked. Personal lifestyle choices, including: Daily care of your teeth and gums. Regular physical activity. Eating a healthy diet. Avoiding tobacco and drug use. Limiting alcohol use. Practicing safe sex. Taking low-dose aspirin every day. Taking vitamin and mineral supplements as recommended by your health care provider. What happens during an annual well check? The services and screenings done by your health care provider during your annual well check will depend on your age, overall health, lifestyle risk factors, and family history of disease. Counseling  Your health care provider may ask you questions about your: Alcohol use. Tobacco use. Drug use. Emotional well-being. Home and  relationship well-being. Sexual activity. Eating habits. History of falls. Memory and ability to understand (cognition). Work and work Astronomer. Reproductive health. Screening  You may have the following tests or measurements: Height, weight, and BMI. Blood pressure. Lipid and cholesterol levels. These may be checked every 5 years, or more frequently if you are over 73 years old. Skin check. Lung cancer screening. You may have this screening every year starting at age 37 if you have a 30-pack-year history of smoking and currently smoke or have quit within the past 15 years. Fecal occult blood test (FOBT) of the stool. You may have this test every year starting at age 82. Flexible sigmoidoscopy or colonoscopy. You may have a sigmoidoscopy every 5 years or a colonoscopy every 10 years starting at age 78. Hepatitis C blood test. Hepatitis B blood test. Sexually transmitted disease (STD) testing. Diabetes screening. This is done by checking your blood sugar (glucose) after you have not eaten for a while (fasting). You may have this done every 1-3 years. Bone density scan. This is done to screen for osteoporosis. You may have this done starting at age 30. Mammogram. This may be done every 1-2 years. Talk to your health care provider about how often you should have regular mammograms. Talk with your health care provider about your test results, treatment options, and if necessary, the need for more tests. Vaccines  Your health care provider may recommend certain vaccines, such as: Influenza vaccine. This is recommended every year. Tetanus, diphtheria, and acellular pertussis (Tdap, Td) vaccine. You may need a Td booster every 10 years. Zoster vaccine. You may need this after age 56. Pneumococcal 13-valent conjugate (PCV13) vaccine. One dose is recommended after age 51. Pneumococcal polysaccharide (PPSV23) vaccine. One dose is recommended after age 67. Talk to your health care  provider  about which screenings and vaccines you need and how often you need them. This information is not intended to replace advice given to you by your health care provider. Make sure you discuss any questions you have with your health care provider. Document Released: 10/01/2015 Document Revised: 05/24/2016 Document Reviewed: 07/06/2015 Elsevier Interactive Patient Education  2017 [place] Prevention in the Home Falls can cause injuries. They can happen to people of all ages. There are many things you can do to make your home safe and to help prevent falls. What can I do on the outside of my home? Regularly fix the edges of walkways and driveways and fix any cracks. Remove anything that might make you trip as you walk through a door, such as a raised step or threshold. Trim any bushes or trees on the path to your home. Use bright outdoor lighting. Clear any walking paths of anything that might make someone trip, such as rocks or tools. Regularly check to see if handrails are loose or broken. Make sure that both sides of any steps have handrails. Any raised decks and porches should have guardrails on the edges. Have any leaves, snow, or ice cleared regularly. Use sand or salt on walking paths during winter. Clean up any spills in your garage right away. This includes oil or grease spills. What can I do in the bathroom? Use night lights. Install grab bars by the toilet and in the tub and shower. Do not use towel bars as grab bars. Use non-skid mats or decals in the tub or shower. If you need to sit down in the shower, use a plastic, non-slip stool. Keep the floor dry. Clean up any water that spills on the floor as soon as it happens. Remove soap buildup in the tub or shower regularly. Attach bath mats securely with double-sided non-slip rug tape. Do not have throw rugs and other things on the floor that can make you trip. What can I do in the bedroom? Use night lights. Make sure  that you have a light by your bed that is easy to reach. Do not use any sheets or blankets that are too big for your bed. They should not hang down onto the floor. Have a firm chair that has side arms. You can use this for support while you get dressed. Do not have throw rugs and other things on the floor that can make you trip. What can I do in the kitchen? Clean up any spills right away. Avoid walking on wet floors. Keep items that you use a lot in easy-to-reach places. If you need to reach something above you, use a strong step stool that has a grab bar. Keep electrical cords out of the way. Do not use floor polish or wax that makes floors slippery. If you must use wax, use non-skid floor wax. Do not have throw rugs and other things on the floor that can make you trip. What can I do with my stairs? Do not leave any items on the stairs. Make sure that there are handrails on both sides of the stairs and use them. Fix handrails that are broken or loose. Make sure that handrails are as long as the stairways. Check any carpeting to make sure that it is firmly attached to the stairs. Fix any carpet that is loose or worn. Avoid having throw rugs at the top or bottom of the stairs. If you do have throw rugs, attach them to the  floor with carpet tape. Make sure that you have a light switch at the top of the stairs and the bottom of the stairs. If you do not have them, ask someone to add them for you. What else can I do to help prevent falls? Wear shoes that: Do not have high heels. Have rubber bottoms. Are comfortable and fit you well. Are closed at the toe. Do not wear sandals. If you use a stepladder: Make sure that it is fully opened. Do not climb a closed stepladder. Make sure that both sides of the stepladder are locked into place. Ask someone to hold it for you, if possible. Clearly mark and make sure that you can see: Any grab bars or handrails. First and last steps. Where the edge of  each step is. Use tools that help you move around (mobility aids) if they are needed. These include: Canes. Walkers. Scooters. Crutches. Turn on the lights when you go into a dark area. Replace any light bulbs as soon as they burn out. Set up your furniture so you have a clear path. Avoid moving your furniture around. If any of your floors are uneven, fix them. If there are any pets around you, be aware of where they are. Review your medicines with your doctor. Some medicines can make you feel dizzy. This can increase your chance of falling. Ask your doctor what other things that you can do to help prevent falls. This information is not intended to replace advice given to you by your health care provider. Make sure you discuss any questions you have with your health care provider. Document Released: 07/01/2009 Document Revised: 02/10/2016 Document Reviewed: 10/09/2014 Elsevier Interactive Patient Education  2017 [employer].

## 2022-07-28 NOTE — Progress Notes (Signed)
Subjective:   Megan Perez is a 76 y.o. female who presents for Medicare Annual (Subsequent) preventive examination.  Review of Systems     Cardiac Risk Factors include: advanced age (>66men, >24 women);hypertension;obesity (BMI >30kg/m2);sedentary lifestyle     Objective:    Today's Vitals   07/28/22 1122  Weight: 261 lb (118.4 kg)  Height: 5\' 7"  (1.702 m)   Body mass index is 40.88 kg/m.     07/28/2022   11:38 AM 07/05/2021    3:35 PM 09/14/2020    9:10 AM 06/29/2020    2:52 PM 03/18/2020   12:25 PM 08/07/2019   10:41 AM 05/06/2019    8:43 AM  Advanced Directives  Does Patient Have a Medical Advance Directive? No No No No No No No  Would patient like information on creating a medical advance directive? No - Patient declined  No - Guardian declined  No - Patient declined      Current Medications (verified) Outpatient Encounter Medications as of 07/28/2022  Medication Sig   acetaminophen (TYLENOL) 500 MG tablet Take 1,300 mg by mouth every 6 (six) hours as needed.    albuterol (VENTOLIN HFA) 108 (90 Base) MCG/ACT inhaler Inhale 2 puffs into the lungs every 4 (four) hours as needed for wheezing or shortness of breath (cough).   azelastine (OPTIVAR) 0.05 % ophthalmic solution Place 2 drops into both eyes daily as needed.   diclofenac sodium (VOLTAREN) 1 % GEL APPLY 2 GRAMS TOPICALLY FOUR TIMES DAILY AS NEEDED FOR MODERATE PAIN OF BACK AND KNEES   fluticasone (FLONASE) 50 MCG/ACT nasal spray Place 2 sprays into both nostrils daily. Use for 4-6 weeks then stop and use seasonally or as needed.   lisinopril (ZESTRIL) 20 MG tablet TAKE 1 TABLET(20 MG) BY MOUTH DAILY   pantoprazole (PROTONIX) 20 MG tablet TAKE 1 TABLET(20 MG) BY MOUTH DAILY BEFORE BREAKFAST   RESTASIS 0.05 % ophthalmic emulsion Place 1 drop into both eyes in the morning and at bedtime.   traMADol (ULTRAM) 50 MG tablet Take 1 tablet (50 mg total) by mouth every 4 (four) hours as needed for moderate pain.    azithromycin (ZITHROMAX) 250 MG tablet TAKE 2 TABLETS BY MOUTH FOR 1 DAY THEN TAKE 1 TABLET BY MOUTH DAILY FOR 4 DAYS (Patient not taking: Reported on 07/28/2022)   benzonatate (TESSALON) 100 MG capsule  (Patient not taking: Reported on 07/28/2022)   furosemide (LASIX) 40 MG tablet TK 1 T PO QD. (Patient not taking: Reported on 07/28/2022)   gabapentin (NEURONTIN) 400 MG capsule TK 1 C PO TID (Patient not taking: Reported on 07/28/2022)   HYDROcodone-acetaminophen (NORCO) 7.5-325 MG tablet TK 1 T PO Q 6 H PRN FOR MODERATE PAIN (Patient not taking: Reported on 07/28/2022)   Melatonin 10 MG TABS Take 10 mg by mouth at bedtime. (Patient not taking: Reported on 07/28/2022)   meloxicam (MOBIC) 15 MG tablet TAKE 1 TABLET BY MOUTH EVERY DAY WITH MEALS. (Patient not taking: Reported on 07/28/2022)   predniSONE (DELTASONE) 10 MG tablet  (Patient not taking: Reported on 07/28/2022)   predniSONE (DELTASONE) 50 MG tablet  (Patient not taking: Reported on 07/28/2022)   No facility-administered encounter medications on file as of 07/28/2022.    Allergies (verified) Baclofen, Meclizine, Other, Penicillins, Prednisone, Prevacid [lansoprazole], Gabapentin, and Oxycodone   History: Past Medical History:  Diagnosis Date   Anxiety    Asthma    reactive airway and shortness of breath w/ exertion in hot temperatures   Bronchitis  GERD (gastroesophageal reflux disease)    Hypertension    Morbid obesity with BMI of 40.0-44.9, adult (HCC) 04/08/2012   OA (osteoarthritis)    knee   Osteoporosis    Prediabetes    Sinus infection    10/2018   Venous (peripheral) insufficiency 08/19/2010   Qualifier: Diagnosis of  By: Laural BenesJohnson MD, [place]     Past Surgical History:  Procedure Laterality Date   ABDOMINAL HYSTERECTOMY     CARPAL TUNNEL RELEASE Right    CHOLECYSTECTOMY     COLONOSCOPY WITH PROPOFOL N/A 06/19/2017   Procedure: COLONOSCOPY WITH PROPOFOL;  Surgeon: Midge MiniumWohl, Darren, MD;  Location: [facility] ENDOSCOPY;   Service: Endoscopy;  Laterality: N/A;   HEEL SPUR SURGERY     REPLACEMENT TOTAL KNEE     left   REPLACEMENT TOTAL KNEE Right    SHOULDER ARTHROSCOPY WITH OPEN ROTATOR CUFF REPAIR Right 09/15/2015   Procedure: SHOULDER ARTHROSCOPY WITH OPEN ROTATOR CUFF REPAIR;  Surgeon: Deeann SaintHoward Miller, MD;  Location: [facility] ORS;  Service: Orthopedics;  Laterality: Right;   SHOULDER SURGERY Left    Family History  Adopted: Yes  Problem Relation Age of Onset   Heart disease Father    Subarachnoid hemorrhage Mother    Hypertension Mother    Subarachnoid hemorrhage Sister    Hypertension Sister    Hypertension Sister    Hypertension Sister    Healthy Sister    Breast cancer Neg Hx    Social History   Socioeconomic History   Marital status: Widowed    Spouse name: Not on file   Number of children: Not on file   Years of education: Not on file   Highest education level: Associate degree: academic program  Occupational History   Occupation: retired  Tobacco Use   Smoking status: Never   Smokeless tobacco: Never  Vaping Use   Vaping Use: Never used  Substance and Sexual Activity   Alcohol use: No   Drug use: No   Sexual activity: Not Currently    Birth control/protection: Surgical  Other Topics Concern   Not on file  Social History Narrative   Retired.  Lives alone   Social Determinants of Health   Financial Resource Strain: Low Risk  (07/28/2022)   Overall Financial Resource Strain (CARDIA)    Difficulty of Paying Living Expenses: Not hard at all  Food Insecurity: No Food Insecurity (07/28/2022)   Hunger Vital Sign    Worried About Running Out of Food in the Last Year: Never true    Ran Out of Food in the Last Year: Never true  Transportation Needs: No Transportation Needs (07/28/2022)   PRAPARE - Administrator, Civil ServiceTransportation    Lack of Transportation (Medical): No    Lack of Transportation (Non-Medical): No  Physical Activity: Inactive (07/28/2022)   Exercise Vital Sign    Days of Exercise per  Week: 0 days    Minutes of Exercise per Session: 0 min  Stress: No Stress Concern Present (07/28/2022)   [employer] - Occupational Stress Questionnaire    Feeling of Stress : Not at all  Social Connections: Moderately Isolated (07/28/2022)   Social Connection and Isolation Panel [NHANES]    Frequency of Communication with Friends and Family: More than three times a week    Frequency of Social Gatherings with Friends and Family: More than three times a week    Attends Religious Services: More than 4 times per year    Active Member of Clubs or Organizations: No  Attends Banker Meetings: Never    Marital Status: Widowed    Tobacco Counseling Counseling given: Not Answered   Clinical Intake:  Pre-visit preparation completed: Yes  Pain : No/denies pain     Nutritional Risks: None Diabetes: No  How often do you need to have someone help you when you read instructions, pamphlets, or other written materials from your doctor or pharmacy?: 1 - Never  Diabetic?no  Interpreter Needed?: No  Information entered by :: Kennedy Bucker, LPN   Activities of Daily Living    07/28/2022   11:41 AM  In your present state of health, do you have any difficulty performing the following activities:  Hearing? 0  Vision? 0  Difficulty concentrating or making decisions? 0  Walking or climbing stairs? 1  Dressing or bathing? 0  Doing errands, shopping? 0  Preparing Food and eating ? N  Using the Toilet? N  In the past six months, have you accidently leaked urine? N  Do you have problems with loss of bowel control? N  Managing your Medications? N  Managing your Finances? N  Housekeeping or managing your Housekeeping? N    Patient Care Team: Smitty Cords, DO as PCP - General (Family Medicine) Laurier Nancy, MD as Consulting Physician (Cardiology) [name], MD (Specialist)  Indicate any recent Medical Services you may have  received from other than [facility] providers in the past year (date may be approximate).     Assessment:   This is a routine wellness examination for [facility].  Hearing/Vision screen Hearing Screening - Comments:: No aids Vision Screening - Comments:: Wears glasses- Dr.Woodard   Dietary issues and exercise activities discussed: Current Exercise Habits: The patient does not participate in regular exercise at present, Exercise limited by: orthopedic condition(s)   Goals Addressed             This Visit's Progress    DIET - EAT MORE FRUITS AND VEGETABLES         Depression Screen    07/28/2022   11:34 AM 04/12/2022   11:15 AM 12/06/2021    9:46 AM 09/27/2021    8:25 AM 07/05/2021    3:39 PM 06/07/2021    3:12 PM 03/02/2021    9:03 AM  PHQ 2/9 Scores  PHQ - 2 Score 0 1 1 1  0 0 0  PHQ- 9 Score 0 5 5 4  4 5     Fall Risk    07/28/2022   11:38 AM 04/12/2022   11:15 AM 12/06/2021    9:46 AM 09/27/2021    8:25 AM 07/05/2021    3:36 PM  Fall Risk   Falls in the past year? 1 1 0 0 0  Number falls in past yr: 1 1 0 0   Injury with Fall? 0 1 0 0   Risk for fall due to : No Fall Risks Impaired mobility;History of fall(s) No Fall Risks No Fall Risks Impaired balance/gait;Medication side effect  Follow up Falls prevention discussed;Falls evaluation completed Falls evaluation completed Falls evaluation completed Falls evaluation completed Falls evaluation completed;Education provided;Falls prevention discussed    FALL RISK PREVENTION PERTAINING TO THE HOME:  Any stairs in or around the home? No  If so, are there any without handrails? No  Home free of loose throw rugs in walkways, pet beds, electrical cords, etc? Yes  Adequate lighting in your home to reduce risk of falls? Yes   ASSISTIVE DEVICES UTILIZED TO PREVENT FALLS:  Life alert? No  Use of a cane, walker or w/c? Yes  Grab bars in the bathroom? Yes  Shower chair or bench in shower? Yes  Elevated toilet seat or a handicapped  toilet? No   TIMED UP AND GO:  Was the test performed? Yes .  Length of time to ambulate 10 feet: 4 sec.   Gait steady and fast with assistive device  Cognitive Function:        07/28/2022   11:42 AM 07/05/2021    3:41 PM 06/29/2020    2:58 PM 04/23/2018    8:40 AM 04/17/2017    8:50 AM  6CIT Screen  What Year? 0 points 0 points 0 points 0 points 0 points  What month? 0 points 0 points 0 points 0 points 0 points  What time? 0 points 0 points 0 points 0 points 0 points  Count back from 20 0 points 0 points 0 points 0 points 0 points  Months in reverse 0 points 2 points 0 points 0 points 0 points  Repeat phrase 0 points 0 points 0 points 2 points 0 points  Total Score 0 points 2 points 0 points 2 points 0 points    Immunizations Immunization History  Administered Date(s) Administered   Fluad Quad(high Dose 65+) 05/28/2019, 05/31/2020, 06/07/2021, 06/27/2022   Influenza Split 06/28/2012, 06/22/2015, 05/23/2016   Influenza Whole 08/20/2010   Influenza, High Dose Seasonal PF 08/06/2013, 06/15/2017, 07/16/2018   Influenza, Seasonal, Injecte, Preservative Fre 06/11/2014   Influenza,inj,Quad PF,6+ Mos 12/10/2014   Influenza-Unspecified 04/20/2013   Moderna Sars-Covid-2 Vaccination 11/15/2019, 12/13/2019, 07/14/2020, 01/15/2021, 06/14/2021   PPD Test 05/01/2016   Pneumococcal Conjugate-13 06/22/2015   Pneumococcal Polysaccharide-23 06/28/2012   Tdap 09/19/2007   Zoster Recombinat (Shingrix) 03/14/2018, 07/31/2018    TDAP status: Due, Education has been provided regarding the importance of this vaccine. Advised may receive this vaccine at local pharmacy or Health Dept. Aware to provide a copy of the vaccination record if obtained from local pharmacy or Health Dept. Verbalized acceptance and understanding.  Flu Vaccine status: Up to date  Pneumococcal vaccine status: Up to date  Covid-19 vaccine status: Completed vaccines  Qualifies for Shingles Vaccine? Yes   Zostavax  completed No   Shingrix Completed?: Yes  Screening Tests Health Maintenance  Topic Date Due   DEXA SCAN  Never done   TETANUS/TDAP  09/18/2017   COVID-19 Vaccine (6 - Moderna risk series) 08/09/2021   Medicare Annual Wellness (AWV)  07/29/2023   Pneumonia Vaccine 17+ Years old  Completed   INFLUENZA VACCINE  Completed   Hepatitis C Screening  Completed   Zoster Vaccines- Shingrix  Completed   HPV VACCINES  Aged Out   COLONOSCOPY (Pts 45-8yrs Insurance coverage will need to be confirmed)  Discontinued    Health Maintenance  Health Maintenance Due  Topic Date Due   DEXA SCAN  Never done   TETANUS/TDAP  09/18/2017   COVID-19 Vaccine (6 - Moderna risk series) 08/09/2021    Colorectal cancer screening: Type of screening: Colonoscopy. Completed 06/19/17. Repeat every 10 years- aged out  Mammogram status: Completed 05/09/22. Repeat every year  [facility] referral declined  Lung Cancer Screening: (Low Dose CT Chest recommended if Age 71-80 years, 30 pack-year currently smoking OR have quit w/in 15years.) does not qualify.   Additional Screening:  Hepatitis C Screening: does qualify; Completed 02/16/16  Vision Screening: Recommended annual ophthalmology exams for early detection of glaucoma and other disorders of the eye. Is the patient up to date with their annual  eye exam?  Yes  Who is the provider or what is the name of the office in which the patient attends annual eye exams? Dr.Woodard If pt is not established with a provider, would they like to be referred to a provider to establish care? No .   Dental Screening: Recommended annual dental exams for proper oral hygiene  Community Resource Referral / Chronic Care Management: CRR required this visit?  No   CCM required this visit?  No      Plan:     I have personally reviewed and noted the following in the patient's chart:   Medical and social history Use of alcohol, tobacco or illicit drugs  Current medications and  supplements including opioid prescriptions. Patient is not currently taking opioid prescriptions. Functional ability and status Nutritional status Physical activity Advanced directives List of other physicians Hospitalizations, surgeries, and ER visits in previous 12 months Vitals Screenings to include cognitive, depression, and falls Referrals and appointments  In addition, I have reviewed and discussed with patient certain preventive protocols, quality metrics, and best practice recommendations. A written personalized care plan for preventive services as well as general preventive health recommendations were provided to patient.     Hal Hope, LPN   07/62/2633   Nurse Notes: none

## 2022-07-31 ENCOUNTER — Other Ambulatory Visit: Payer: Medicare Other

## 2022-07-31 DIAGNOSIS — Z0000 Encounter for general adult medical examination without abnormal findings: Secondary | ICD-10-CM | POA: Diagnosis not present

## 2022-07-31 DIAGNOSIS — E7800 Pure hypercholesterolemia, unspecified: Secondary | ICD-10-CM | POA: Diagnosis not present

## 2022-07-31 DIAGNOSIS — R7303 Prediabetes: Secondary | ICD-10-CM | POA: Diagnosis not present

## 2022-07-31 DIAGNOSIS — I10 Essential (primary) hypertension: Secondary | ICD-10-CM | POA: Diagnosis not present

## 2022-08-01 LAB — COMPREHENSIVE METABOLIC PANEL
AG Ratio: 1.6 (calc) (ref 1.0–2.5)
ALT: 7 U/L (ref 6–29)
AST: 9 U/L — ABNORMAL LOW (ref 10–35)
Albumin: 4.2 g/dL (ref 3.6–5.1)
Alkaline phosphatase (APISO): 70 U/L (ref 37–153)
BUN: 21 mg/dL (ref 7–25)
CO2: 27 mmol/L (ref 20–32)
Calcium: 9.3 mg/dL (ref 8.6–10.4)
Chloride: 105 mmol/L (ref 98–110)
Creat: 0.9 mg/dL (ref 0.60–1.00)
Globulin: 2.7 g/dL (calc) (ref 1.9–3.7)
Glucose, Bld: 104 mg/dL — ABNORMAL HIGH (ref 65–99)
Potassium: 4.3 mmol/L (ref 3.5–5.3)
Sodium: 142 mmol/L (ref 135–146)
Total Bilirubin: 0.3 mg/dL (ref 0.2–1.2)
Total Protein: 6.9 g/dL (ref 6.1–8.1)

## 2022-08-01 LAB — LIPID PANEL
Cholesterol: 185 mg/dL (ref <200)
HDL: 65 mg/dL (ref >=50)
LDL Cholesterol (Calc): 105 mg/dL (calc) — ABNORMAL HIGH
Non-HDL Cholesterol (Calc): 120 mg/dL (calc) (ref <130)
Total CHOL/HDL Ratio: 2.8 (calc) (ref <5.0)
Triglycerides: 66 mg/dL (ref <150)

## 2022-08-01 LAB — CBC WITH DIFFERENTIAL/PLATELET
Absolute Monocytes: 256 cells/uL (ref 200–950)
Basophils Absolute: 50 cells/uL (ref 0–200)
Basophils Relative: 1.2 %
Eosinophils Absolute: 101 cells/uL (ref 15–500)
Eosinophils Relative: 2.4 %
HCT: 36.3 % (ref 35.0–45.0)
Hemoglobin: 11.9 g/dL (ref 11.7–15.5)
Lymphs Abs: 1924 cells/uL (ref 850–3900)
MCH: 30.9 pg (ref 27.0–33.0)
MCHC: 32.8 g/dL (ref 32.0–36.0)
MCV: 94.3 fL (ref 80.0–100.0)
MPV: 10.2 fL (ref 7.5–12.5)
Monocytes Relative: 6.1 %
Neutro Abs: 1869 cells/uL (ref 1500–7800)
Neutrophils Relative %: 44.5 %
Platelets: 294 10*3/uL (ref 140–400)
RBC: 3.85 10*6/uL (ref 3.80–5.10)
RDW: 13 % (ref 11.0–15.0)
Total Lymphocyte: 45.8 %
WBC: 4.2 10*3/uL (ref 3.8–10.8)

## 2022-08-01 LAB — HEMOGLOBIN A1C
Hgb A1c MFr Bld: 6.3 % of total Hgb — ABNORMAL HIGH (ref <5.7)
Mean Plasma Glucose: 134 mg/dL
eAG (mmol/L): 7.4 mmol/L

## 2022-08-01 LAB — TSH: TSH: 2.03 mIU/L (ref 0.40–4.50)

## 2022-08-03 ENCOUNTER — Other Ambulatory Visit: Payer: Self-pay | Admitting: Family Medicine

## 2022-08-03 DIAGNOSIS — K219 Gastro-esophageal reflux disease without esophagitis: Secondary | ICD-10-CM

## 2022-08-03 NOTE — Telephone Encounter (Signed)
Requested Prescriptions  Pending Prescriptions Disp Refills   pantoprazole (PROTONIX) 20 MG tablet [Pharmacy Med Name: PANTOPRAZOLE 20MG  TABLETS] 90 tablet 1    Sig: TAKE 1 TABLET(20 MG) BY MOUTH DAILY BEFORE BREAKFAST     Gastroenterology: Proton Pump Inhibitors Passed - 08/03/2022 12:16 PM      Passed - Valid encounter within last 12 months    Recent Outpatient Visits           1 month ago Trigger finger, right ring finger   [facility], [place], NP   3 months ago Chronic pain syndrome   [facility], [place], DO   5 months ago Pain and swelling of left knee   [facility] [place], [place], DO   8 months ago Acute non-recurrent frontal sinusitis   [facility], [place], DO   10 months ago Mild intermittent asthma with acute exacerbation   [facility], Althea Charon, DO       Future Appointments             In 1 week Netta Neat, Althea Charon, DO [facility], [facility]

## 2022-08-15 ENCOUNTER — Ambulatory Visit (INDEPENDENT_AMBULATORY_CARE_PROVIDER_SITE_OTHER): Payer: Medicare Other | Admitting: Family Medicine

## 2022-08-15 ENCOUNTER — Encounter: Payer: Self-pay | Admitting: Family Medicine

## 2022-08-15 VITALS — BP 138/74 | HR 76 | Ht 67.0 in | Wt 268.0 lb

## 2022-08-15 DIAGNOSIS — I890 Lymphedema, not elsewhere classified: Secondary | ICD-10-CM | POA: Diagnosis not present

## 2022-08-15 DIAGNOSIS — M25462 Effusion, left knee: Secondary | ICD-10-CM | POA: Diagnosis not present

## 2022-08-15 DIAGNOSIS — Z0000 Encounter for general adult medical examination without abnormal findings: Secondary | ICD-10-CM

## 2022-08-15 DIAGNOSIS — G894 Chronic pain syndrome: Secondary | ICD-10-CM | POA: Diagnosis not present

## 2022-08-15 DIAGNOSIS — R7303 Prediabetes: Secondary | ICD-10-CM

## 2022-08-15 DIAGNOSIS — M25562 Pain in left knee: Secondary | ICD-10-CM

## 2022-08-15 DIAGNOSIS — G8929 Other chronic pain: Secondary | ICD-10-CM | POA: Diagnosis not present

## 2022-08-15 DIAGNOSIS — Z6841 Body Mass Index (BMI) 40.0 and over, adult: Secondary | ICD-10-CM

## 2022-08-15 DIAGNOSIS — H8110 Benign paroxysmal vertigo, unspecified ear: Secondary | ICD-10-CM | POA: Diagnosis not present

## 2022-08-15 DIAGNOSIS — E7800 Pure hypercholesterolemia, unspecified: Secondary | ICD-10-CM | POA: Diagnosis not present

## 2022-08-15 MED ORDER — TRAMADOL HCL 50 MG PO TABS: 50.0000 mg | ORAL_TABLET | Freq: Every evening | ORAL | 2 refills | Status: DC | PRN

## 2022-08-15 NOTE — Patient Instructions (Addendum)
Thank you for coming to the office today.  Re ordered Tramadol, take at night, let me know in future we can dose increase.  Talk with Dr Idalia Needle about the radiofreq ablations  Take the allergy pill every day , to avoid allergic response.   Recent Labs    12/06/21 1015 07/31/22 0926  HGBA1C 6.0* 6.3*     Eat at least 3 meals and 1-2 snacks per day (don't skip breakfast).  Aim for no more than 5 hours between eating. - Tip: If you go >5 hours without eating and become very hungry, your body will supply it's own resources temporarily and you can gain extra weight when you eat.  Diet Recommendations for Pre-Diabetes   REDUCE Starchy (carb) foods include: Bread, rice, pasta, potatoes, corn, crackers, bagels, muffins, all baked goods.   FRUITS - LIMIT these HIGH sugar/carb fruits = Pineapple, Watermelon, Bananas - OKAY with these MEDIUM sugar/carb fruits = Citrus, Oranges, Grapes - PREFER these LOW sugar/carb fruits = Apples, Berries, Pears, Plums  Protein foods include: Meat, fish, poultry, eggs, dairy foods, and beans such as pinto and kidney beans (beans also provide carbohydrate).   1. Eat at least 3 meals and 1-2 snacks per day. Never go more than 4-5 hours while awake without eating.   2. Limit starchy foods to TWO per meal and ONE per snack. ONE portion of a starchy  food is equal to the following:   - ONE slice of bread (or its equivalent, such as half of a hamburger bun).   - 1/2 cup of a "scoopable" starchy food such as potatoes or rice.   - 1 OUNCE (28 grams) of starchy snacks (crackers or pretzels, look on label).   - 15 grams of carbohydrate as shown on food label.   3. Both lunch and dinner should include a protein food, a carb food, and vegetables.   - Obtain twice as many veg's as protein or carbohydrate foods for both lunch and dinner.   - Try to keep frozen veg's on hand for a quick vegetable serving.     - Fresh or frozen veg's are best.   4. Breakfast should  always include protein.    -------------------------  1. You have symptoms of Vertigo (Benign Paroxysmal Positional Vertigo) - This is commonly caused by inner ear fluid imbalance, sometimes can be worsened by allergies and sinus symptoms, otherwise it can occur randomly sometimes and we may never discover the exact cause. - To treat this, try the Epley Manuever (see diagrams/instructions below) at home up to 3 times a day for 1-2 weeks or until symptoms resolve If you develop significant worsening episode with vertigo that does not improve and you get severe headache, loss of vision, arm or leg weakness, slurred speech, or other concerning symptoms please seek immediate medical attention at Emergency Department.   See the next page for images describing the Epley Manuever.     ----------------------------------------------------------------------------------------------------------------------         Please schedule a Follow-up Appointment to: Return in about 3 months (around 11/15/2022) for 3 month follow-up PreDM A1c, Vertigo, Pain/Refill.  If you have any other questions or concerns, please feel free to call the office or send a message through MyChart. You may also schedule an earlier appointment if necessary.  Additionally, you may be receiving a survey about your experience at our office within a few days to 1 week by e-mail or mail. We value your feedback.  Saralyn Pilar, DO [facility]  [place]

## 2022-08-15 NOTE — Progress Notes (Signed)
Subjective:    Patient ID: Megan Perez, female    DOB: 07/13/1946, 76 y.o.   MRN: [id number]  Megan Perez is a 76 y.o. female presenting on 08/15/2022 for Annual Exam   HPI  Here for Annual Physical and Lab Review.   Insomnia Chronic problem, was improved while on Tizanidine / Lyrica doses however now off med and reduced she has had difficulty falling asleep, now tried Melatonin 10mg  OTC with some good results. But still has issues.   Chronic Lymphedema / Bilateral LE Edema Chronic problem, chart review since 2018+ prior PCP managing her edema. In 2018 was referred to [facility] - Cardiology Dr Neoma Laming had stress test and cardiac work-up that was unremarkable. Also had ECHO that showed Grade 1 diastolic dysfunction otherwise no cause of swelling. Renal function had been stable   Followed by Vascular, Dr Lucky Cowboy [facility] On Lymphedema pump OFF of Furosemide still. Doing well. Denies any chest pain, dyspnea at rest, coughing wheezing, arm pain, chest pressure, near syncope or lightheadedness   Chronic Pain Lumbar DDD Chronic Back Pain Osteoarthritis multiple joints, chronic knee pain S/p TKR   Previously followed by Pain [facility] Dr Dossie Arbour She does not want to pursue higher dose pain meds or procedures / surgery, she has tried injections without significant relief. The sedative / numbing without improvement.  RAF questions  Needs re order Tramadol   Previous TKR L side years ago, that Dr is deceased. She had L sided TKR 02/20/09, by Dr Candee Furbish at [facility], [place]   More recent history R TKR by Dr [facility].   She is having a hard time sleeping due to pain. She takes Tylenol 650mg  x 2 = 1300 Cannot take NSAID. History of GI Bleed. Allergic reaction or intolerance to Gabapentin, PreGabalin, Tizanidine, Baclofen.   PreDM Elevated A1c 6.3 Previously 5.8 to 6.0 Improved lifestyle and wt loss   HYPERLIPIDEMIA: -  Reports no concerns. Last lipid panel improved 07/2022   CHRONIC HTN: Reports elevated BP today due to stress see below. Current Meds - Lisinopril 20mg  daily   Reports good compliance, took meds today. Tolerating well, w/o complaints. Denies CP, dyspnea, HA, edema, dizziness / lightheadedness   Additional update:   Her nephew 94 yr killed recently in MVC. She is working on processing this right now and has a good support system. She has some sad mood with grieving but she feels she is able to manage it at this time.  Allergic conjunctivitis Contact with family/friend dog and it triggers her allergies eye watering, she tries eye drops, she has OTC anti histamine cetirizine but only takes when gets allergic response.     Health Maintenance:  Updated Flu Shot, PNEUMONIA Vaccine  Mammogram completed 05/09/22     08/15/2022    2:26 PM 07/28/2022   11:34 AM 04/12/2022   11:15 AM  Depression screen PHQ 2/9  Decreased Interest 0 0 1  Down, Depressed, Hopeless 0 0 0  PHQ - 2 Score 0 0 1  Altered sleeping 0 0 2  Tired, decreased energy 0 0 1  Change in appetite 0 0 1  Feeling bad or failure about yourself  0 0 0  Trouble concentrating 0 0 0  Moving slowly or fidgety/restless 0 0 0  Suicidal thoughts 0 0 0  PHQ-9 Score 0 0 5  Difficult doing work/chores Not difficult at all Not difficult at all Not difficult at all    Past  Medical History:  Diagnosis Date   Anxiety    Asthma    reactive airway and shortness of breath w/ exertion in hot temperatures   Bronchitis    GERD (gastroesophageal reflux disease)    Hypertension    Morbid obesity with BMI of 40.0-44.9, adult (HCC) 04/08/2012   OA (osteoarthritis)    knee   Osteoporosis    Prediabetes    Sinus infection    10/2018   Venous (peripheral) insufficiency 08/19/2010   Qualifier: Diagnosis of  By: [name], [place]     Past Surgical History:  Procedure Laterality Date   ABDOMINAL HYSTERECTOMY     CARPAL TUNNEL RELEASE  Right    CHOLECYSTECTOMY     COLONOSCOPY WITH PROPOFOL N/A 06/19/2017   Procedure: COLONOSCOPY WITH PROPOFOL;  Surgeon: [name], MD;  Location: [facility] ENDOSCOPY;  Service: Endoscopy;  Laterality: N/A;   HEEL SPUR SURGERY     REPLACEMENT TOTAL KNEE     left   REPLACEMENT TOTAL KNEE Right    SHOULDER ARTHROSCOPY WITH OPEN ROTATOR CUFF REPAIR Right 09/15/2015   Procedure: SHOULDER ARTHROSCOPY WITH OPEN ROTATOR CUFF REPAIR;  Surgeon: [name], MD;  Location: [facility] ORS;  Service: Orthopedics;  Laterality: Right;   SHOULDER SURGERY Left    Social History   Socioeconomic History   Marital status: Widowed    Spouse name: Not on file   Number of children: Not on file   Years of education: Not on file   Highest education level: Associate degree: academic program  Occupational History   Occupation: retired  Tobacco Use   Smoking status: Never   Smokeless tobacco: Never  Vaping Use   Vaping Use: Never used  Substance and Sexual Activity   Alcohol use: No   Drug use: No   Sexual activity: Not Currently    Birth control/protection: Surgical  Other Topics Concern   Not on file  Social History Narrative   Retired.  Lives alone   Social Determinants of Health   Financial Resource Strain: Low Risk  (07/28/2022)   Overall Financial Resource Strain (CARDIA)    Difficulty of Paying Living Expenses: Not hard at all  Food Insecurity: No Food Insecurity (07/28/2022)   Hunger Vital Sign    Worried About Running Out of Food in the Last Year: Never true    Ran Out of Food in the Last Year: Never true  Transportation Needs: No Transportation Needs (07/28/2022)   PRAPARE - Administrator, Civil Service (Medical): No    Lack of Transportation (Non-Medical): No  Physical Activity: Inactive (07/28/2022)   Exercise Vital Sign    Days of Exercise per Week: 0 days    Minutes of Exercise per Session: 0 min  Stress: No Stress Concern Present (07/28/2022)   [employer] of  Occupational Health - Occupational Stress Questionnaire    Feeling of Stress : Not at all  Social Connections: Moderately Isolated (07/28/2022)   Social Connection and Isolation Panel [NHANES]    Frequency of Communication with Friends and Family: More than three times a week    Frequency of Social Gatherings with Friends and Family: More than three times a week    Attends Religious Services: More than 4 times per year    Active Member of [employer] or Organizations: No    Attends Banker Meetings: Never    Marital Status: Widowed  Intimate Partner Violence: Not At Risk (07/28/2022)   Humiliation, Afraid, Rape, and Kick questionnaire  Fear of Current or Ex-Partner: No    Emotionally Abused: No    Physically Abused: No    Sexually Abused: No   Family History  Adopted: Yes  Problem Relation Age of Onset   Heart disease Father    Subarachnoid hemorrhage Mother    Hypertension Mother    Subarachnoid hemorrhage Sister    Hypertension Sister    Hypertension Sister    Hypertension Sister    Healthy Sister    Breast cancer Neg Hx    Current Outpatient Medications on File Prior to Visit  Medication Sig   acetaminophen (TYLENOL) 500 MG tablet Take 1,300 mg by mouth every 6 (six) hours as needed.    albuterol (VENTOLIN HFA) 108 (90 Base) MCG/ACT inhaler Inhale 2 puffs into the lungs every 4 (four) hours as needed for wheezing or shortness of breath (cough).   azelastine (OPTIVAR) 0.05 % ophthalmic solution Place 2 drops into both eyes daily as needed.   diclofenac sodium (VOLTAREN) 1 % GEL APPLY 2 GRAMS TOPICALLY FOUR TIMES DAILY AS NEEDED FOR MODERATE PAIN OF BACK AND KNEES   fluticasone (FLONASE) 50 MCG/ACT nasal spray Place 2 sprays into both nostrils daily. Use for 4-6 weeks then stop and use seasonally or as needed.   lisinopril (ZESTRIL) 20 MG tablet TAKE 1 TABLET(20 MG) BY MOUTH DAILY   pantoprazole (PROTONIX) 20 MG tablet TAKE 1 TABLET(20 MG) BY MOUTH DAILY BEFORE  BREAKFAST   RESTASIS 0.05 % ophthalmic emulsion Place 1 drop into both eyes in the morning and at bedtime.   Melatonin 10 MG TABS Take 10 mg by mouth at bedtime. (Patient not taking: Reported on 07/28/2022)   No current facility-administered medications on file prior to visit.    Review of Systems  Constitutional:  Negative for activity change, appetite change, chills, diaphoresis, fatigue and fever.  HENT:  Negative for congestion and hearing loss.   Eyes:  Negative for visual disturbance.  Respiratory:  Negative for cough, chest tightness, shortness of breath and wheezing.   Cardiovascular:  Negative for chest pain, palpitations and leg swelling.  Gastrointestinal:  Negative for abdominal pain, constipation, diarrhea, nausea and vomiting.  Genitourinary:  Negative for dysuria, frequency and hematuria.  Musculoskeletal:  Negative for arthralgias and neck pain.  Skin:  Negative for rash.  Neurological:  Negative for dizziness, weakness, light-headedness, numbness and headaches.  Hematological:  Negative for adenopathy.  Psychiatric/Behavioral:  Negative for behavioral problems, dysphoric mood and sleep disturbance.    Per HPI unless specifically indicated above      Objective:    BP 138/74 (BP Location: Left Arm, Cuff Size: Normal)   Pulse 76   Ht 5\' 7"  (1.702 m)   Wt 268 lb (121.6 kg)   SpO2 97%   BMI 41.97 kg/m   Wt Readings from Last 3 Encounters:  08/15/22 268 lb (121.6 kg)  07/28/22 261 lb (118.4 kg)  06/27/22 268 lb (121.6 kg)    Physical Exam Vitals and nursing note reviewed.  Constitutional:      General: She is not in acute distress.    Appearance: She is well-developed. She is not diaphoretic.     Comments: Well-appearing, comfortable, cooperative  HENT:     Head: Normocephalic and atraumatic.  Eyes:     General:        Right eye: No discharge.        Left eye: No discharge.     Conjunctiva/sclera: Conjunctivae normal.     Pupils: Pupils are equal,  round,  and reactive to light.  Neck:     Thyroid: No thyromegaly.  Cardiovascular:     Rate and Rhythm: Normal rate and regular rhythm.     Pulses: Normal pulses.     Heart sounds: Normal heart sounds. No murmur heard. Pulmonary:     Effort: Pulmonary effort is normal. No respiratory distress.     Breath sounds: Normal breath sounds. No wheezing or rales.  Abdominal:     General: Bowel sounds are normal. There is no distension.     Palpations: Abdomen is soft. There is no mass.     Tenderness: There is no abdominal tenderness.  Musculoskeletal:        General: No tenderness. Normal range of motion.     Cervical back: Normal range of motion and neck supple.     Right lower leg: Edema present.     Left lower leg: Edema present.     Comments: Upper / Lower Extremities: - Normal muscle tone, strength bilateral upper extremities 5/5, lower extremities 5/5  Lymphadenopathy:     Cervical: No cervical adenopathy.  Skin:    General: Skin is warm and dry.     Findings: No erythema or rash.  Neurological:     Mental Status: She is alert and oriented to person, place, and time.     Comments: Distal sensation intact to light touch all extremities  Psychiatric:        Mood and Affect: Mood normal.        Behavior: Behavior normal.        Thought Content: Thought content normal.     Comments: Well groomed, good eye contact, normal speech and thoughts      Results for orders placed or performed in visit on 07/28/22  HgB A1c  Result Value Ref Range   Hgb A1c MFr Bld 6.3 (H) <5.7 % of total Hgb   Mean Plasma Glucose 134 mg/dL   eAG (mmol/L) 7.4 mmol/L  Lipid panel  Result Value Ref Range   Cholesterol 185 <200 mg/dL   HDL 65 > OR = 50 mg/dL   Triglycerides 66 <150 mg/dL   LDL Cholesterol (Calc) 105 (H) mg/dL (calc)   Total CHOL/HDL Ratio 2.8 <5.0 (calc)   Non-HDL Cholesterol (Calc) 120 <130 mg/dL (calc)  Comprehensive Metabolic Panel (CMET)  Result Value Ref Range   Glucose, Bld 104 (H)  65 - 99 mg/dL   BUN 21 7 - 25 mg/dL   Creat 0.90 0.60 - 1.00 mg/dL   BUN/Creatinine Ratio SEE NOTE: 6 - 22 (calc)   Sodium 142 135 - 146 mmol/L   Potassium 4.3 3.5 - 5.3 mmol/L   Chloride 105 98 - 110 mmol/L   CO2 27 20 - 32 mmol/L   Calcium 9.3 8.6 - 10.4 mg/dL   Total Protein 6.9 6.1 - 8.1 g/dL   Albumin 4.2 3.6 - 5.1 g/dL   Globulin 2.7 1.9 - 3.7 g/dL (calc)   AG Ratio 1.6 1.0 - 2.5 (calc)   Total Bilirubin 0.3 0.2 - 1.2 mg/dL   Alkaline phosphatase (APISO) 70 37 - 153 U/L   AST 9 (L) 10 - 35 U/L   ALT 7 6 - 29 U/L  CBC with Differential  Result Value Ref Range   WBC 4.2 3.8 - 10.8 Thousand/uL   RBC 3.85 3.80 - 5.10 Million/uL   Hemoglobin 11.9 11.7 - 15.5 g/dL   HCT 36.3 35.0 - 45.0 %   MCV 94.3 80.0 - 100.0  fL   MCH 30.9 27.0 - 33.0 pg   MCHC 32.8 32.0 - 36.0 g/dL   RDW 13.0 11.0 - 15.0 %   Platelets 294 140 - 400 Thousand/uL   MPV 10.2 7.5 - 12.5 fL   Neutro Abs 1,869 1,500 - 7,800 cells/uL   Lymphs Abs 1,924 850 - 3,900 cells/uL   Absolute Monocytes 256 200 - 950 cells/uL   Eosinophils Absolute 101 15 - 500 cells/uL   Basophils Absolute 50 0 - 200 cells/uL   Neutrophils Relative % 44.5 %   Total Lymphocyte 45.8 %   Monocytes Relative 6.1 %   Eosinophils Relative 2.4 %   Basophils Relative 1.2 %  TSH  Result Value Ref Range   TSH 2.03 0.40 - 4.50 mIU/L      Assessment & Plan:   Problem List Items Addressed This Visit     Chronic pain syndrome (Chronic)   Relevant Medications   traMADol (ULTRAM) 50 MG tablet   Lymphedema of both lower extremities   Morbid obesity with BMI of 40.0-44.9, adult ([place])   Prediabetes   Pure hypercholesterolemia   Other Visit Diagnoses     Annual physical exam    -  Primary   Chronic pain of left knee       Relevant Medications   traMADol (ULTRAM) 50 MG tablet   Pain and swelling of left knee       Relevant Medications   traMADol (ULTRAM) 50 MG tablet   Benign paroxysmal positional vertigo, unspecified laterality            Updated Health Maintenance information Reviewed recent lab results with patient Encouraged improvement to lifestyle with diet and exercise Goal of weight loss  Lymphedema Followed by Vascular on Lymphedema pump.  PreDM Elevated A1c to 6.3  Chronic Pain Syndrome Followed by [facility] for procedural intervention  Re ordered Tramadol, take at night, let me know in future we can dose increase.  Talk with Dr Wynona Canes about the radiofreq ablations  Take the allergy pill every day , to avoid allergic response.  Vertigo BPPV Handout AVS Epley Maneuver Future ENT vestibular if indicated   Meds ordered this encounter  Medications   traMADol (ULTRAM) 50 MG tablet    Sig: Take 1 tablet (50 mg total) by mouth at bedtime as needed for moderate pain.    Dispense:  30 tablet    Refill:  2      Follow up plan: Return in about 3 months (around 11/15/2022) for 3 month follow-up PreDM A1c, Vertigo, Pain/Refill.  Nobie Putnam, DO [facility] 08/15/2022, 10:03 AM

## 2022-09-20 DIAGNOSIS — H2513 Age-related nuclear cataract, bilateral: Secondary | ICD-10-CM | POA: Diagnosis not present

## 2022-09-20 DIAGNOSIS — H02882 Meibomian gland dysfunction right lower eyelid: Secondary | ICD-10-CM | POA: Diagnosis not present

## 2022-09-20 DIAGNOSIS — H02885 Meibomian gland dysfunction left lower eyelid: Secondary | ICD-10-CM | POA: Diagnosis not present

## 2022-09-20 DIAGNOSIS — Z0100 Encounter for examination of eyes and vision without abnormal findings: Secondary | ICD-10-CM | POA: Diagnosis not present

## 2022-09-20 DIAGNOSIS — H35033 Hypertensive retinopathy, bilateral: Secondary | ICD-10-CM | POA: Diagnosis not present

## 2022-09-25 ENCOUNTER — Other Ambulatory Visit: Payer: Self-pay

## 2022-09-25 DIAGNOSIS — K219 Gastro-esophageal reflux disease without esophagitis: Secondary | ICD-10-CM

## 2022-09-25 DIAGNOSIS — I10 Essential (primary) hypertension: Secondary | ICD-10-CM

## 2022-09-25 MED ORDER — PANTOPRAZOLE SODIUM 20 MG PO TBEC: DELAYED_RELEASE_TABLET | ORAL | 1 refills | Status: DC

## 2022-09-25 MED ORDER — LISINOPRIL 20 MG PO TABS: ORAL_TABLET | ORAL | 1 refills | Status: DC

## 2022-09-26 ENCOUNTER — Other Ambulatory Visit: Payer: Self-pay | Admitting: Family Medicine

## 2022-09-26 DIAGNOSIS — G8929 Other chronic pain: Secondary | ICD-10-CM

## 2022-09-26 DIAGNOSIS — M25562 Pain in left knee: Secondary | ICD-10-CM

## 2022-09-26 MED ORDER — TRAMADOL HCL 50 MG PO TABS: 50.0000 mg | ORAL_TABLET | Freq: Every evening | ORAL | 2 refills | Status: DC | PRN

## 2022-09-26 NOTE — Telephone Encounter (Signed)
Requested medication (s) are due for refill today: no  Requested medication (s) are on the active medication list: yes  Last refill:  08/15/22 #30 2 RF  Future visit scheduled: yes  Notes to clinic:  med not delegated to NT to reorder- too soon   Requested Prescriptions  Pending Prescriptions Disp Refills   traMADol (ULTRAM) 50 MG tablet 30 tablet 2    Sig: Take 1 tablet (50 mg total) by mouth at bedtime as needed for moderate pain.     Not Delegated - Analgesics:  Opioid Agonists Failed - 09/26/2022 10:19 AM      Failed - This refill cannot be delegated      Failed - Urine Drug Screen completed in last 360 days      Passed - Valid encounter within last 3 months    Recent Outpatient Visits           1 month ago Annual physical exam   [place], DO   3 months ago Trigger finger, right ring finger   [facility] [place], Coralie Keens, NP   5 months ago Chronic pain syndrome   [place], DO   7 months ago Pain and swelling of left knee   [place], DO   9 months ago Acute non-recurrent frontal sinusitis   [place], DO       Future Appointments             In 1 month [place], Devonne Doughty, [facility], [facility]

## 2022-09-26 NOTE — Telephone Encounter (Signed)
Medication Refill - Medication: traMADol (ULTRAM) 50 MG tablet   Has the patient contacted their pharmacy? Yes.     Preferred Pharmacy (with phone number or street name):  [facility], [place] Phone: [phone]  Fax: [phone]     Has the patient been seen for an appointment in the last year OR does the patient have an upcoming appointment? Yes.    Please assist patient further

## 2022-09-28 NOTE — Progress Notes (Signed)
PROVIDER NOTE: Information contained herein reflects review and annotations entered in association with encounter. Interpretation of such information and data should be left to medically-trained personnel. Information provided to patient can be located elsewhere in the medical record under "Patient Instructions". Document created using STT-dictation technology, any transcriptional errors that may result from process are unintentional.    Patient: Megan Perez  Service Category: E/M  Provider: Oswaldo Done, MD  DOB: 1946-03-26  DOS: 10/02/2022  Referring Provider: Saralyn Pilar *  MRN: [id number]  Specialty: Interventional Pain Management  PCP: Smitty Cords, DO  Type: Established Patient  Setting: Ambulatory outpatient    Location: Office  Delivery: Face-to-face     HPI  [name], a 77 y.o. year old female, is here today because of her Chronic bilateral low back pain with right-sided sciatica [M54.41, G89.29]. Megan Perez primary complain today is Back Pain (lower) Last encounter: My last encounter with her was on Visit date not found. Pertinent problems: Megan Perez has Arthritis; History of total knee replacement (Right); Degenerative spondylolisthesis; Full thickness rotator cuff tear; Impingement syndrome of shoulder region; Osteoarthrosis, localized, primary, knee; Rotator cuff rupture, complete; Chronic low back pain (1ry area of Pain) (Bilateral) (R>L) w/ sciatica (Right); Chronic lower extremity pain (2ry area of Pain) (Right); Chronic knee pain (3ry area of Pain) (Bilateral) (R>L); Chronic pain syndrome; History of knee replacement, total (Left); Chronic knee pain after total knee replacement (Bilateral); Chronic sacroiliac joint pain (Bilateral) (R>L); Lumbar facet syndrome (Bilateral) (R>L); Lumbar (6 mm) Grade 1 Anterolisthesis of L4/L5 and L3/L4; DDD (degenerative disc disease), lumbar; Spondylosis without myelopathy or radiculopathy, lumbosacral region;  Chronic low back pain (1ry area of Pain) (Bilateral) (R>L) w/o sciatica; Chronic peripheral neuropathic pain; Neurogenic pain; Chronic lumbar radiculitis (L5) (Right); Chronic musculoskeletal pain; Chronic hip pain (Left); Chronic sacroiliac joint pain (Left); Chronic groin pain (Left); Lymphedema of both lower extremities; Drug-induced myopathy; Swelling of limb; Muscle spasms of both lower extremities; Other spondylosis, sacral and sacrococcygeal region; Sacrococcygeal disorders, not elsewhere classified; Biceps tendinitis of shoulder (Left); and Chronic shoulder pain (Left) on their pertinent problem list. Pain Assessment: Severity of Chronic pain is reported as a 9 /10. Location: Back Right, Left/radiaties down to the top of her buttock (left shoulder pain). Onset: More than a month ago. Quality: Tightness, Pressure, Aching, Constant, Throbbing. Timing: Constant. Modifying factor(s): Meds. Vitals:  height is 5\' 7"  (1.702 m) and weight is 268 lb (121.6 kg). Her temperature is 97.2 F (36.2 C) (abnormal). Her blood pressure is 148/80 (abnormal) and her pulse is 80. Her oxygen saturation is 99%.  BMI: Estimated body mass index is 41.97 kg/m as calculated from the following:   Height as of this encounter: 5\' 7"  (1.702 m).   Weight as of this encounter: 268 lb (121.6 kg).  Reason for encounter: evaluation of worsening, or previously known (established) problem (low back pain).  Today the patient indicates that her worst pain is that of the left upper extremity around the shoulder region.  Specifically she points that the area between the deltoid muscle and the elbow on the anterolateral aspect.  Physical exam today was positive for tenderness to palpation and reproduction of the patient's pain upon putting pressure over the biceps tendon.  She refers that she has had this pain for almost 1 month and she was unable to sleep last night due to the pain.  She denies any type of injuries, diabetes, but she  describes being allergic to oral prednisone  where she breaks up and develops a rash.  The patient also indicates that she is having a flareup of her low back pain (Midline) at the level of the L4-5.  She describes pain with prolonged standing and some discomfort with sitting.  She describes this as a "pressure".  She denies any lower extremity pain, numbness, or weakness.  She describes that recently Dr. Parks Ranger told her that he would not be prescribing her tramadol anymore since she has been taking that for quite some time.  According to our records she is taking tramadol 50 mg tablet, 1 tab p.o. daily.  Review of the patient's PMP reveals that he is the average amount that she has been taking.  This is the equivalent of 5-10 MME/day.  Today I have recommended to the patient to begin working on her weight.  I have informed the patient that based on pain management studies, their goal is to have a BMI of 30 or less.  She indicates being 5 feet 7 inches tall and therefore a BMI of 30 on her would be around 195 pounds.  She describes being approximately 255 pounds at this moment.  Her last recorded weight was 268 pounds with a BMI of 41.97 kg/m.  I have informed the patient that this is adversely contributing to her low back pain.  Physical exam today is compatible with a lumbar facet joint pain.  X-rays of the lumbar spine indicate the patient to have a 6 mm anterolisthesis of L4 over L5 which is likely to be responsible for some of the pain that she is experiencing in the lumbar spine.  Because the patient's left shoulder pain is worse than the right, she has requested that we start treatment there and then follow-up with treatment of her low back pain.  Since she seems to be intolerant to the oral prednisone, she is not a good candidate for an opioid taper.  However, in the past she has been able to tolerate interventional therapies using triamcinolone.  Today we will schedule her to return for a left  biceps tendon injection, possibly followed 2 weeks later by treatment of her midline low back pain.    In terms of the patient's medications, I have reminded the patient that due to shortage of manpower we no longer offer medication management.  However, based on the patient's medical problems, and the amount of medicine that she has been taking in the past, I believe it to be appropriate for the patient to continue on the tramadol, should her PCP decide to prescribe it.  The patient was informed of the plan.  She understood and agreed.  Last therapeutic intervention (09/14/2020): Right lumbar facet MBB (L2-S1) #3+ right SI joint block #1 (results: 100/100/100/90-100)   Pharmacotherapy Assessment  Analgesic: No chronic opioid analgesics therapy prescribed by our practice.  Tramadol 50 mg tablet, 1 tab p.o. daily.  (10 MME/day) by PCP    Monitoring: [place] PMP: PDMP reviewed during this encounter.       Pharmacotherapy: No side-effects or adverse reactions reported. Compliance: No problems identified. Effectiveness: Clinically acceptable.  Chauncey Fischer, RN  10/02/2022  9:43 AM  Sign when Signing Visit Safety precautions to be maintained throughout the outpatient stay will include: orient to surroundings, keep bed in low position, maintain call bell within reach at all times, provide assistance with transfer out of bed and ambulation.     No results found for: "CBDTHCR" No results found for: "D8THCCBX" No results found  for: "D9THCCBX"  UDS:  Summary  Date Value Ref Range Status  11/05/2018 FINAL  Final    Comment:    ==================================================================== TOXASSURE COMP DRUG ANALYSIS,UR ==================================================================== Test                             Result       Flag       Units Drug Present and Declared for Prescription Verification   Acetaminophen                  PRESENT      EXPECTED Drug Absent but Declared for  Prescription Verification   Baclofen                       Not Detected UNEXPECTED   Diclofenac                     Not Detected UNEXPECTED    Topical diclofenac, as indicated in the declared medication list,    is not always detected even when used as directed.   Salicylate                     Not Detected UNEXPECTED    Aspirin, as indicated in the declared medication list, is not    always detected even when used as directed. ==================================================================== Test                      Result    Flag   Units      Ref Range   Creatinine              93               mg/dL      >=20 ==================================================================== Declared Medications:  The flagging and interpretation on this report are based on the  following declared medications.  Unexpected results may arise from  inaccuracies in the declared medications.  **Note: The testing scope of this panel includes these medications:  Baclofen  **Note: The testing scope of this panel does not include small to  moderate amounts of these reported medications:  Acetaminophen  Aspirin (Aspirin 81)  Topical Diclofenac  **Note: The testing scope of this panel does not include following  reported medications:  Azelastine  Furosemide  Lisinopril  Pantoprazole  Potassium  Pravastatin  Sucralfate ==================================================================== For clinical consultation, please call [phone]. ====================================================================       ROS  Constitutional: Denies any fever or chills Gastrointestinal: No reported hemesis, hematochezia, vomiting, or acute GI distress Musculoskeletal: Denies any acute onset joint swelling, redness, loss of ROM, or weakness Neurological: No reported episodes of acute onset apraxia, aphasia, dysarthria, agnosia, amnesia, paralysis, loss of coordination, or loss of  consciousness  Medication Review  acetaminophen, azelastine, diclofenac sodium, fluticasone, lisinopril, pantoprazole, and traMADol  History Review  Allergy: Megan Perez is allergic to baclofen, meclizine, other, penicillins, prednisone, prevacid [lansoprazole], gabapentin, oxycodone, pregabalin er, and tizanidine hcl. Drug: Megan Perez  reports no history of drug use. Alcohol:  reports no history of alcohol use. Tobacco:  reports that she has never smoked. She has never used smokeless tobacco. Social: Megan Perez  reports that she has never smoked. She has never used smokeless tobacco. She reports that she does not drink alcohol and does not use drugs. Medical:  has a past medical history of Anxiety, Asthma, Bronchitis, GERD (gastroesophageal reflux  disease), Hypertension, Morbid obesity with BMI of 40.0-44.9, adult ([place]) (04/08/2012), OA (osteoarthritis), Osteoporosis, Prediabetes, Sinus infection, and Venous (peripheral) insufficiency (08/19/2010). Surgical: Megan Perez  has a past surgical history that includes Abdominal hysterectomy; Cholecystectomy; Replacement total knee; Heel spur surgery; Shoulder surgery (Left); Shoulder arthroscopy with open rotator cuff repair (Right, 09/15/2015); Replacement total knee (Right); Carpal tunnel release (Right); and Colonoscopy with propofol (N/A, 06/19/2017). Family: family history includes Healthy in her sister; Heart disease in her father; Hypertension in her mother, sister, sister, and sister; Subarachnoid hemorrhage in her mother and sister. She was adopted.  Laboratory Chemistry Profile   Renal Lab Results  Component Value Date   BUN 21 07/31/2022   CREATININE 0.90 07/31/2022   BCR SEE NOTE: 07/31/2022   GFRAA 90 06/03/2020   GFRNONAA 78 06/03/2020    Hepatic Lab Results  Component Value Date   AST 9 (L) 07/31/2022   ALT 7 07/31/2022   ALBUMIN 3.9 01/16/2017   ALKPHOS 73 01/16/2017   HCVAB NEGATIVE 02/16/2016    Electrolytes Lab Results   Component Value Date   NA 142 07/31/2022   K 4.3 07/31/2022   CL 105 07/31/2022   CALCIUM 9.3 07/31/2022   MG 2.1 11/05/2018    Bone Lab Results  Component Value Date   25OHVITD1 13 (L) 11/05/2018   25OHVITD2 <1.0 11/05/2018   25OHVITD3 13 11/05/2018    Inflammation (CRP: Acute Phase) (ESR: Chronic Phase) Lab Results  Component Value Date   CRP 11 (H) 11/05/2018   ESRSEDRATE 30 11/05/2018         Note: Above Lab results reviewed.  Recent Imaging Review  DG Lumbar Spine Complete W/Bend CLINICAL DATA:  Low back pain.  EXAM: LUMBAR SPINE - COMPLETE WITH BENDING VIEWS  COMPARISON:  December 04, 2018  FINDINGS: There is no evidence of lumbar spine fracture. Mild curvature of spine. Grade 1 anterolisthesis of L3-4, L4-5 identified stable compared prior exam. Decreased intervertebral space identified at L5-S1. Facet joint sclerosis of the lower lumbar spine noted.  IMPRESSION: Degenerative joint changes of lumbar spine.  Electronically Signed   By: Abelardo Diesel M.D.   On: 10/02/2022 11:46 Note: Reviewed        Physical Exam  General appearance: Well nourished, well developed, and well hydrated. In no apparent acute distress Mental status: Alert, oriented x 3 (person, place, & time)       Respiratory: No evidence of acute respiratory distress Eyes: PERLA Vitals: BP (!) 148/80   Pulse 80   Temp (!) 97.2 F (36.2 C)   Ht 5\' 7"  (1.702 m)   Wt 268 lb (121.6 kg)   SpO2 99%   BMI 41.97 kg/m  BMI: Estimated body mass index is 41.97 kg/m as calculated from the following:   Height as of this encounter: 5\' 7"  (1.702 m).   Weight as of this encounter: 268 lb (121.6 kg). Ideal: Ideal body weight: 61.6 kg (135 lb 12.9 oz) Adjusted ideal body weight: 85.6 kg (188 lb 10.9 oz)  Assessment   Diagnosis Status  1. Chronic low back pain (1ry area of Pain) (Bilateral) (R>L) w/ sciatica (Right)   2. Lumbar (6 mm) Anterolisthesis of L4/L5   3. Lumbar facet syndrome  (Bilateral) (R>L)   4. Spondylosis without myelopathy or radiculopathy, lumbosacral region   5. DDD (degenerative disc disease), lumbar   6. Chronic lower extremity pain (2ry area of Pain) (Right)   7. Chronic knee pain (3ry area of Pain) (Bilateral) (R>L)   8.  Biceps tendinitis of shoulder (Left)   9. Chronic shoulder pain (Left)    Controlled Controlled Controlled   Updated Problems: Problem  Biceps tendinitis of shoulder (Left)  Chronic shoulder pain (Left)  Lumbar (6 mm) Grade 1 Anterolisthesis of L4/L5 and L3/L4     Plan of Care  Problem-specific:  No problem-specific Assessment & Plan notes found for this encounter.  Megan Perez has a current medication list which includes the following long-term medication(s): fluticasone, lisinopril, and pantoprazole.  Pharmacotherapy (Medications Ordered): No orders of the defined types were placed in this encounter.  Orders:  Orders Placed This Encounter  Procedures   SHOULDER INJECTION    Standing Status:   Future    Standing Expiration Date:   01/01/2023    Scheduling Instructions:     Procedure: Left biceps tendon inj.     Side: Left-sided     Level: Glenohumeral joint               Sedation: Patient's choice.     Timeframe: As permitted by the schedule    Order Specific Question:   Where will this procedure be performed?    Answer:   [facility]   DG Lumbar Spine Complete W/Bend    Patient presents with axial pain with possible radicular component. Please assist [place] in identifying specific level(s) and laterality of any additional findings such as: 1. Facet (Zygapophyseal) joint DJD (Hypertrophy, space narrowing, subchondral sclerosis, and/or osteophyte formation) 2. DDD and/or IVDD (Loss of disc height, desiccation, gas patterns, osteophytes, endplate sclerosis, or "Black disc disease") 3. Pars defects 4. Spondylolisthesis, spondylosis, and/or spondyloarthropathies (include Degree/Grade of displacement in mm)  (stability) 5. Vertebral body Fractures (acute/chronic) (state percentage of collapse) 6. Demineralization (osteopenia/osteoporotic) 7. Bone pathology 8. Foraminal narrowing  9. Surgical changes    Standing Status:   Future    Number of Occurrences:   1    Standing Expiration Date:   11/02/2022    Scheduling Instructions:     Please make sure that the patient understands that this needs to be done as soon as possible. Never have the patient do the imaging "just before the next appointment". Inform patient that having the imaging done within the [facility] will expedite the availability of the results and will provide      imaging availability to the requesting physician. In addition inform the patient that the imaging order has an expiration date and will not be renewed if not done within the active period.    Order Specific Question:   Reason for Exam (SYMPTOM  OR DIAGNOSIS REQUIRED)    Answer:   Low back pain    Order Specific Question:   Preferred imaging location?    Answer:   [facility]    Order Specific Question:   Call Results- Best Contact Number?    Answer:   [phone] ([facility])    Order Specific Question:   Radiology Contrast Protocol - do NOT remove file path    Answer:   [email or web address]    Order Specific Question:   Release to patient    Answer:   Immediate   DG Shoulder Left    Please make sure that the patient understands that this needs to be done as soon as possible. Never have the patient do the imaging "just before the next appointment". Inform patient that having the imaging done within the [facility] will expedite the availability of the results and will provide imaging availability  to the requesting physician. In addition inform the patient that the imaging order has an expiration date and will not be renewed if not done within the active period.    Standing Status:   Future    Number of Occurrences:   1     Standing Expiration Date:   11/02/2022    Scheduling Instructions:     Imaging must be done as soon as possible. Inform patient that order will expire within 30 days and I will not renew it.    Order Specific Question:   Reason for Exam (SYMPTOM  OR DIAGNOSIS REQUIRED)    Answer:   Left shoulder pain    Order Specific Question:   Preferred imaging location?    Answer:   [facility]    Order Specific Question:   Call Results- Best Contact Number?    Answer:   [phone] ([facility])    Order Specific Question:   Release to patient    Answer:   Immediate   Follow-up plan:   Return for [facility]): (L) Shoulder inj. (Biceps tendon).      Interventional Therapies  Risk Factors  Considerations:  NOTE: NO further RFA attempts until she brings her BMI<35. (Technically difficult procedure due to intra-procedural non-compliance and very unreliable feedback.)    Planned  Pending:   Diagnostic/Therapeutic Left biceps tendon inj. #1    Under consideration:   Diagnostic Midline L4-5 interspinous process inj. #1  NO RFA (BMI)    Completed:   Therapeutic right lumbar facet MBB x3 (09/14/2020) (100/100/100/90-100)  Therapeutic left lumbar facet MBB x2 (06/12/2019) (100/100/100 x 3 days/90-100)  Therapeutic right lumbar facet RFA x1 (08/07/2019) (100/100/50/50)  Therapeutic left lumbar facet RFA x1 (09/02/2019) (100/100/100/90-100)  Diagnostic/therapeutic right SI Blk x1 (09/14/2020) (100/100/100/90-100)    Completed by other providers:   None at this time   Therapeutic  Palliative (PRN) options:   Diagnostic bilateral lumbar facet MBB     Pharmacotherapy  Nonopioids transferred 08/18/2020: Calcium, Lyrica, and Zanaflex.  Patient informed that we will not be able to take over her opioid analgesic management.      Recent Visits No visits were found meeting these conditions. Showing recent visits within past 90 days and meeting all other requirements Today's Visits Date  Type Provider Dept  10/02/22 Office Visit Milinda Pointer, MD [facility]  Showing today's visits and meeting all other requirements Future Appointments Date Type Provider Dept  10/12/22 Appointment Milinda Pointer, MD [facility]  Showing future appointments within next 90 days and meeting all other requirements  I discussed the assessment and treatment plan with the patient. The patient was provided an opportunity to ask questions and all were answered. The patient agreed with the plan and demonstrated an understanding of the instructions.  Patient advised to call back or seek an in-person evaluation if the symptoms or condition worsens.  Duration of encounter: 35 minutes.  Total time on encounter, as per AMA guidelines included both the face-to-face and non-face-to-face time personally spent by the physician and/or other qualified health care professional(s) on the day of the encounter (includes time in activities that require the physician or other qualified health care professional and does not include time in activities normally performed by clinical staff). Physician's time may include the following activities when performed: Preparing to see the patient (e.g., pre-charting review of records, searching for previously ordered imaging, lab work, and nerve conduction tests) Review of prior analgesic pharmacotherapies. Reviewing PMP Interpreting ordered tests (  e.g., lab work, imaging, nerve conduction tests) Performing post-procedure evaluations, including interpretation of diagnostic procedures Obtaining and/or reviewing separately obtained history Performing a medically appropriate examination and/or evaluation Counseling and educating the patient/family/caregiver Ordering medications, tests, or procedures Referring and communicating with other health care professionals (when not separately reported) Documenting clinical information in the electronic or other  health record Independently interpreting results (not separately reported) and communicating results to the patient/ family/caregiver Care coordination (not separately reported)  Note by: Gaspar Cola, MD Date: 10/02/2022; Time: 12:11 PM

## 2022-10-02 ENCOUNTER — Ambulatory Visit: Payer: Medicare HMO | Admitting: Pain Medicine

## 2022-10-02 ENCOUNTER — Ambulatory Visit
Admission: RE | Admit: 2022-10-02 | Discharge: 2022-10-02 | Disposition: A | Discharge status: Home / Self Care | Payer: Medicare HMO | Source: Ambulatory Visit | Attending: Pain Medicine | Admitting: Pain Medicine

## 2022-10-02 ENCOUNTER — Encounter: Payer: Self-pay | Admitting: Pain Medicine

## 2022-10-02 VITALS — BP 148/80 | HR 80 | Temp 97.2°F | Ht 67.0 in | Wt 268.0 lb

## 2022-10-02 DIAGNOSIS — M5441 Lumbago with sciatica, right side: Secondary | ICD-10-CM

## 2022-10-02 DIAGNOSIS — M7522 Bicipital tendinitis, left shoulder: Secondary | ICD-10-CM | POA: Insufficient documentation

## 2022-10-02 DIAGNOSIS — M47816 Spondylosis without myelopathy or radiculopathy, lumbar region: Secondary | ICD-10-CM | POA: Insufficient documentation

## 2022-10-02 DIAGNOSIS — M47817 Spondylosis without myelopathy or radiculopathy, lumbosacral region: Secondary | ICD-10-CM | POA: Insufficient documentation

## 2022-10-02 DIAGNOSIS — G8929 Other chronic pain: Secondary | ICD-10-CM | POA: Insufficient documentation

## 2022-10-02 DIAGNOSIS — M25512 Pain in left shoulder: Secondary | ICD-10-CM | POA: Insufficient documentation

## 2022-10-02 DIAGNOSIS — M25561 Pain in right knee: Secondary | ICD-10-CM

## 2022-10-02 DIAGNOSIS — M4310 Spondylolisthesis, site unspecified: Secondary | ICD-10-CM | POA: Insufficient documentation

## 2022-10-02 DIAGNOSIS — M79604 Pain in right leg: Secondary | ICD-10-CM

## 2022-10-02 DIAGNOSIS — M5136 Other intervertebral disc degeneration, lumbar region: Secondary | ICD-10-CM | POA: Insufficient documentation

## 2022-10-02 DIAGNOSIS — M75102 Unspecified rotator cuff tear or rupture of left shoulder, not specified as traumatic: Secondary | ICD-10-CM | POA: Diagnosis not present

## 2022-10-02 DIAGNOSIS — M4316 Spondylolisthesis, lumbar region: Secondary | ICD-10-CM | POA: Diagnosis not present

## 2022-10-02 DIAGNOSIS — M25562 Pain in left knee: Secondary | ICD-10-CM

## 2022-10-02 DIAGNOSIS — M19012 Primary osteoarthritis, left shoulder: Secondary | ICD-10-CM | POA: Diagnosis not present

## 2022-10-02 NOTE — Progress Notes (Signed)
Safety precautions to be maintained throughout the outpatient stay will include: orient to surroundings, keep bed in low position, maintain call bell within reach at all times, provide assistance with transfer out of bed and ambulation.  

## 2022-10-02 NOTE — Patient Instructions (Signed)
______________________________________________________________________  Preparing for your procedure  During your procedure appointment there will be: No Prescription Refills. No disability issues to discussed. No medication changes or discussions.  Instructions: Food intake: Avoid eating anything solid for at least 8 hours prior to your procedure. Clear liquid intake: You may take clear liquids such as water up to 2 hours prior to your procedure. (No carbonated drinks. No soda.) Transportation: Unless otherwise stated by your physician, bring a driver. Morning Medicines: Except for blood thinners, take all of your other morning medications with a sip of water. Make sure to take your heart and blood pressure medicines. If your blood pressure's lower number is above 100, the case will be rescheduled. Blood thinners: Make sure to stop your blood thinners as instructed.  If you take a blood thinner, but were not instructed to stop it, call our office [phone] and ask to talk to a nurse. Not stopping a blood thinner prior to certain procedures could lead to serious complications. Diabetics on insulin: Notify the staff so that you can be scheduled 1st case in the morning. If your diabetes requires high dose insulin, take only  of your normal insulin dose the morning of the procedure and notify the staff that you have done so. Preventing infections: Shower with an antibacterial soap the morning of your procedure.  Build-up your immune system: Take 1000 mg of Vitamin C with every meal (3 times a day) the day prior to your procedure. Antibiotics: Inform the nursing staff if you are taking any antibiotics or if you have any conditions that may require antibiotics prior to procedures. (Example: recent joint implants)   Pregnancy: If you are pregnant make sure to notify the nursing staff. Not doing so may result in injury to the fetus, including death.  Sickness: If you have a cold, fever, or any  active infections, call and cancel or reschedule your procedure. Receiving steroids while having an infection may result in complications. Arrival: You must be in the facility at least 30 minutes prior to your scheduled procedure. Tardiness: Your scheduled time is also the cutoff time. If you do not arrive at least 15 minutes prior to your procedure, you will be rescheduled.  Children: Do not bring any children with you. Make arrangements to keep them home. Dress appropriately: There is always a possibility that your clothing may get soiled. Avoid long dresses. Valuables: Do not bring any jewelry or valuables.  Reasons to call and reschedule or cancel your procedure: (Following these recommendations will minimize the risk of a serious complication.) Surgeries: Avoid having procedures within 2 weeks of any surgery. (Avoid for 2 weeks before or after any surgery). Flu Shots: Avoid having procedures within 2 weeks of a flu shots or . (Avoid for 2 weeks before or after immunizations). Barium: Avoid having a procedure within 7-10 days after having had a radiological study involving the use of radiological contrast. (Myelograms, Barium swallow or enema study). Heart attacks: Avoid any elective procedures or surgeries for the initial 6 months after a "Myocardial Infarction" (Heart Attack). Blood thinners: It is imperative that you stop these medications before procedures. Let us know if you if you take any blood thinner.  Infection: Avoid procedures during or within two weeks of an infection (including chest colds or gastrointestinal problems). Symptoms associated with infections include: Localized redness, fever, chills, night sweats or profuse sweating, burning sensation when voiding, cough, congestion, stuffiness, runny nose, sore throat, diarrhea, nausea, vomiting, cold or Flu symptoms, recent or   current infections. It is specially important if the infection is over the area that we intend to treat. Heart  and lung problems: Symptoms that may suggest an active cardiopulmonary problem include: cough, chest pain, breathing difficulties or shortness of breath, dizziness, ankle swelling, uncontrolled high or unusually low blood pressure, and/or palpitations. If you are experiencing any of these symptoms, cancel your procedure and contact your primary care physician for an evaluation.  Remember:  Regular Business hours are:  Monday to Thursday 8:00 AM to 4:00 PM  Provider's Schedule: Milinda Pointer, MD:  Procedure days: Tuesday and Thursday 7:30 AM to 4:00 PM  Gillis Santa, MD:  Procedure days: Monday and Wednesday 7:30 AM to 4:00 PM  ______________________________________________________________________    ____________________________________________________________________________________________  General Risks and Possible Complications  Patient Responsibilities: It is important that you read this as it is part of your informed consent. It is our duty to inform you of the risks and possible complications associated with treatments offered to you. It is your responsibility as a patient to read this and to ask questions about anything that is not clear or that you believe was not covered in this document.  Patient's Rights: You have the right to refuse treatment. You also have the right to change your mind, even after initially having agreed to have the treatment done. However, under this last option, if you wait until the last second to change your mind, you may be charged for the materials used up to that point.  Introduction: Medicine is not an Chief Strategy Officer. Everything in Medicine, including the lack of treatment(s), carries the potential for danger, harm, or loss (which is by definition: Risk). In Medicine, a complication is a secondary problem, condition, or disease that can aggravate an already existing one. All treatments carry the risk of possible complications. The fact that a side  effects or complications occurs, does not imply that the treatment was conducted incorrectly. It must be clearly understood that these can happen even when everything is done following the highest safety standards.  No treatment: You can choose not to proceed with the proposed treatment alternative. The "PRO(s)" would include: avoiding the risk of complications associated with the therapy. The "CON(s)" would include: not getting any of the treatment benefits. These benefits fall under one of three categories: diagnostic; therapeutic; and/or palliative. Diagnostic benefits include: getting information which can ultimately lead to improvement of the disease or symptom(s). Therapeutic benefits are those associated with the successful treatment of the disease. Finally, palliative benefits are those related to the decrease of the primary symptoms, without necessarily curing the condition (example: decreasing the pain from a flare-up of a chronic condition, such as incurable terminal cancer).  General Risks and Complications: These are associated to most interventional treatments. They can occur alone, or in combination. They fall under one of the following six (6) categories: no benefit or worsening of symptoms; bleeding; infection; nerve damage; allergic reactions; and/or death. No benefits or worsening of symptoms: In Medicine there are no guarantees, only probabilities. No healthcare provider can ever guarantee that a medical treatment will work, they can only state the probability that it may. Furthermore, there is always the possibility that the condition may worsen, either directly, or indirectly, as a consequence of the treatment. Bleeding: This is more common if the patient is taking a blood thinner, either prescription or over the counter (example: Goody Powders, Fish oil, Aspirin, Garlic, etc.), or if suffering a condition associated with impaired coagulation (example: Hemophilia, cirrhosis  of the liver,  low platelet counts, etc.). However, even if you do not have one on these, it can still happen. If you have any of these conditions, or take one of these drugs, make sure to notify your treating physician. Infection: This is more common in patients with a compromised immune system, either due to disease (example: diabetes, cancer, human immunodeficiency virus [HIV], etc.), or due to medications or treatments (example: therapies used to treat cancer and rheumatological diseases). However, even if you do not have one on these, it can still happen. If you have any of these conditions, or take one of these drugs, make sure to notify your treating physician. Nerve Damage: This is more common when the treatment is an invasive one, but it can also happen with the use of medications, such as those used in the treatment of cancer. The damage can occur to small secondary nerves, or to large primary ones, such as those in the spinal cord and brain. This damage may be temporary or permanent and it may lead to impairments that can range from temporary numbness to permanent paralysis and/or brain death. Allergic Reactions: Any time a substance or material comes in contact with our body, there is the possibility of an allergic reaction. These can range from a mild skin rash (contact dermatitis) to a severe systemic reaction (anaphylactic reaction), which can result in death. Death: In general, any medical intervention can result in death, most of the time due to an unforeseen complication. ____________________________________________________________________________________________

## 2022-10-04 ENCOUNTER — Other Ambulatory Visit: Payer: Self-pay | Admitting: Family Medicine

## 2022-10-04 DIAGNOSIS — G8929 Other chronic pain: Secondary | ICD-10-CM

## 2022-10-04 DIAGNOSIS — M25462 Effusion, left knee: Secondary | ICD-10-CM

## 2022-10-04 MED ORDER — TRAMADOL HCL 50 MG PO TABS: 50.0000 mg | ORAL_TABLET | Freq: Every evening | ORAL | 2 refills | Status: DC | PRN

## 2022-10-08 NOTE — Progress Notes (Unsigned)
PROVIDER NOTE: Interpretation of information contained herein should be left to medically-trained personnel. Specific patient instructions are provided elsewhere under "Patient Instructions" section of medical record. This document was created in part using STT-dictation technology, any transcriptional errors that may result from this process are unintentional.  Patient: Megan Perez Type: Established DOB: [birth year] MRN: [id number] PCP: Olin Hauser, DO  Service: Procedure DOS: 10/12/2022 Setting: Ambulatory Location: Ambulatory outpatient facility Delivery: Face-to-face Provider: Gaspar Cola, MD Specialty: Interventional Pain Management Specialty designation: 09 Location: Outpatient facility Ref. Prov.: Nobie Putnam *    Interventional Therapy    Procedure: Glenohumeral, acromioclavicular joints, and biceps tendon Injection #1  Laterality: Left (-LT)  Level: Shoulder   Imaging: Fluoroscopy-guided         Anesthesia: Local anesthesia (1-2% Lidocaine) Anxiolysis: IV Versed 2.0 mg Sedation: No Sedation                       DOS: 10/12/2022  Performed by: Gaspar Cola, MD  Purpose: Diagnostic/Therapeutic Indications: Shoulder pain severe enough to impact quality of life or function. Rationale (medical necessity): procedure needed and proper for the diagnosis and/or treatment of Megan Perez's medical symptoms and needs. 1. Chronic shoulder pain (Left)   2. Biceps tendinitis of shoulder (Left)   3. Osteoarthritis of shoulder (Left)   4. Osteoarthritis of glenohumeral joint (Left)   5. Osteoarthritis of AC (acromioclavicular) joint (Left)    NAS-11 Pain score:   Pre-procedure: 9 /10   Post-procedure: 0-No pain/10      Target: Glenohumeral Joint (shoulder) Location: Intra-articular  Region: Entire Shoulder Area Approach: Anterolateral approach. Type of procedure: Percutaneous joint injection   Position  Prep  Materials:  Position:  Supine Prep solution: DuraPrep (Iodine Povacrylex [0.7% available iodine] and Isopropyl Alcohol, 74% w/w) Prep Area: Entire shoulder Area Materials:  Tray: Block Needle(s):  Type: Spinal  Gauge (G): 22  Length: 3.5-in  Qty: 1  Pre-op H&P Assessment:  Megan Perez is a 77 y.o. (year old), female patient, seen today for interventional treatment. She  has a past surgical history that includes Abdominal hysterectomy; Cholecystectomy; Replacement total knee; Heel spur surgery; Shoulder surgery (Left); Shoulder arthroscopy with open rotator cuff repair (Right, 09/15/2015); Replacement total knee (Right); Carpal tunnel release (Right); and Colonoscopy with propofol (N/A, 06/19/2017). Megan Perez has a current medication list which includes the following prescription(s): acetaminophen, azelastine, diclofenac sodium, fluticasone, lisinopril, pantoprazole, and tramadol, and the following Facility-Administered Medications: lactated ringers. Her primarily concern today is the shoulder injection  Initial Vital Signs:  Pulse/HCG Rate: 79ECG Heart Rate: 79 Temp: (!) 97.3 F (36.3 C) Resp: 18 BP: (!) 161/93 SpO2: 100 %  BMI: Estimated body mass index is 40.72 kg/m as calculated from the following:   Height as of this encounter: 5\' 7"  (1.702 m).   Weight as of this encounter: 260 lb (117.9 kg).  Risk Assessment: Allergies: Reviewed. She is allergic to baclofen, meclizine, other, penicillins, prednisone, prevacid [lansoprazole], gabapentin, oxycodone, pregabalin er, and tizanidine hcl.  Allergy Precautions: None required Coagulopathies: Reviewed. None identified.  Blood-thinner therapy: None at this time Active Infection(s): Reviewed. None identified. Megan Perez is afebrile  Site Confirmation: Megan Perez was asked to confirm the procedure and laterality before marking the site Procedure checklist: Completed Consent: Before the procedure and under the influence of no sedative(s), amnesic(s), or  anxiolytics, the patient was informed of the treatment options, risks and possible complications. To fulfill our ethical and legal obligations, as recommended by  the American Medical Association's Code of Ethics, I have informed the patient of my clinical impression; the nature and purpose of the treatment or procedure; the risks, benefits, and possible complications of the intervention; the alternatives, including doing nothing; the risk(s) and benefit(s) of the alternative treatment(s) or procedure(s); and the risk(s) and benefit(s) of doing nothing. The patient was provided information about the general risks and possible complications associated with the procedure. These may include, but are not limited to: failure to achieve desired goals, infection, bleeding, organ or nerve damage, allergic reactions, paralysis, and death. In addition, the patient was informed of those risks and complications associated to the procedure, such as failure to decrease pain; infection; bleeding; organ or nerve damage with subsequent damage to sensory, motor, and/or autonomic systems, resulting in permanent pain, numbness, and/or weakness of one or several areas of the body; allergic reactions; (i.e.: anaphylactic reaction); and/or death. Furthermore, the patient was informed of those risks and complications associated with the medications. These include, but are not limited to: allergic reactions (i.e.: anaphylactic or anaphylactoid reaction(s)); adrenal axis suppression; blood sugar elevation that in diabetics may result in ketoacidosis or comma; water retention that in patients with history of congestive heart failure may result in shortness of breath, pulmonary edema, and decompensation with resultant heart failure; weight gain; swelling or edema; medication-induced neural toxicity; particulate matter embolism and blood vessel occlusion with resultant organ, and/or nervous system infarction; and/or aseptic necrosis of one or  more joints. Finally, the patient was informed that Medicine is not an exact science; therefore, there is also the possibility of unforeseen or unpredictable risks and/or possible complications that may result in a catastrophic outcome. The patient indicated having understood very clearly. We have given the patient no guarantees and we have made no promises. Enough time was given to the patient to ask questions, all of which were answered to the patient's satisfaction. Megan Perez has indicated that she wanted to continue with the procedure. Attestation: I, the ordering provider, attest that I have discussed with the patient the benefits, risks, side-effects, alternatives, likelihood of achieving goals, and potential problems during recovery for the procedure that I have provided informed consent. Date  Time: 10/12/2022  9:50 AM   Imaging Guidance (Non-Spinal):          Type of Imaging Technique: Fluoroscopy Guidance (Non-Spinal) Indication(s): Assistance in needle guidance and placement for procedures requiring needle placement in or near specific anatomical locations not easily accessible without such assistance. Exposure Time: Please see nurses notes. Contrast: None used. Fluoroscopic Guidance: I was personally present during the use of fluoroscopy. "Tunnel Vision Technique" used to obtain the best possible view of the target area. Parallax error corrected before commencing the procedure. "Direction-depth-direction" technique used to introduce the needle under continuous pulsed fluoroscopy. Once target was reached, antero-posterior, oblique, and lateral fluoroscopic projection used confirm needle placement in all planes. Images permanently stored in EMR. Interpretation: No contrast injected. I personally interpreted the imaging intraoperatively. Adequate needle placement confirmed in multiple planes. Permanent images saved into the patient's record.  Pre-Procedure Preparation:  Monitoring: As per  clinic protocol. Respiration, ETCO2, SpO2, BP, heart rate and rhythm monitor placed and checked for adequate function Safety Precautions: Patient was assessed for positional comfort and pressure points before starting the procedure. Time-out: I initiated and conducted the "Time-out" before starting the procedure, as per protocol. The patient was asked to participate by confirming the accuracy of the "Time Out" information. Verification of the correct person, site, and  procedure were performed and confirmed by me, the nursing staff, and the patient. "Time-out" conducted as per Joint Commission's Universal Protocol (UP.01.01.01). Time: 72  Description  Narrative of Procedure:          Rationale (medical necessity): procedure needed and proper for the diagnosis and/or treatment of the patient's medical symptoms and needs. Procedural Technique Safety Precautions: Aspiration looking for blood return was conducted prior to all injections. At no point did we inject any substances, as a needle was being advanced. No attempts were made at seeking any paresthesias. Safe injection practices and needle disposal techniques used. Medications properly checked for expiration dates. SDV (single dose vial) medications used. Description of the Procedure: Protocol guidelines were followed. The patient was placed in position over the procedure table. The target area was identified and the area prepped in the usual manner. Skin & deeper tissues infiltrated with local anesthetic. Appropriate amount of time allowed to pass for local anesthetics to take effect. The procedure needles were then advanced to the target area. Proper needle placement secured. Negative aspiration confirmed. Solution injected in intermittent fashion, asking for systemic symptoms every 0.5cc of injectate. The needles were then removed and the area cleansed, making sure to leave some of the prepping solution back to take advantage of its long term  bactericidal properties.             Vitals:   10/12/22 1000 10/12/22 1011 10/12/22 1016 10/12/22 1020  BP: (!) 160/91 (!) 166/87 (!) 159/94 (!) 154/86  Pulse:      Resp: 13 20 18 18   Temp:      TempSrc:      SpO2: 98% 96% 97% 98%  Weight:      Height:         Start Time: 1015 hrs. End Time: 1020 hrs.  Antibiotic Prophylaxis:   Anti-infectives (From admission, onward)    None      Indication(s): None identified  Post-operative Assessment:  Post-procedure Vital Signs:  Pulse/HCG Rate: 7971 Temp: (!) 97.3 F (36.3 C) Resp: 18 BP: (!) 154/86 SpO2: 98 %  EBL: None  Complications: No immediate post-treatment complications observed by team, or reported by patient.  Note: The patient tolerated the entire procedure well. A repeat set of vitals were taken after the procedure and the patient was kept under observation following institutional policy, for this type of procedure. Post-procedural neurological assessment was performed, showing return to baseline, prior to discharge. The patient was provided with post-procedure discharge instructions, including a section on how to identify potential problems. Should any problems arise concerning this procedure, the patient was given instructions to immediately contact , at any time, without hesitation. In any case, we plan to contact the patient by telephone for a follow-up status report regarding this interventional procedure.  Comments:  No additional relevant information.  Plan of Care  Orders:  Orders Placed This Encounter  Procedures   SHOULDER INJECTION    Scheduling Instructions:     Procedure: Intra-articular shoulder (Glenohumeral) joint and (AC) Acromioclavicular joint injection     Side: Left-sided     Level: Glenohumeral joint and (AC) Acromioclavicular joint     Sedation: Patient's choice.     Timeframe: Today    Order Specific Question:   Where will this procedure be performed?    Answer:   [facility]   [facility] C-ARM 1-60 MIN NO REPORT    Intraoperative interpretation by procedural physician at [facility].  Standing Status:   Standing    Number of Occurrences:   1    Order Specific Question:   Reason for exam:    Answer:   Assistance in needle guidance and placement for procedures requiring needle placement in or near specific anatomical locations not easily accessible without such assistance.   Informed Consent Details: Physician/Practitioner Attestation; Transcribe to consent form and obtain patient signature    Note: Always confirm laterality of pain with Megan Perez, before procedure.    Order Specific Question:   Physician/Practitioner attestation of informed consent for procedure/surgical case    Answer:   I, the physician/practitioner, attest that I have discussed with the patient the benefits, risks, side effects, alternatives, likelihood of achieving goals and potential problems during recovery for the procedure that I have provided informed consent.    Order Specific Question:   Procedure    Answer:   Intra-articular shoulder joint injection under fluoroscopic guidance    Order Specific Question:   Physician/Practitioner performing the procedure    Answer:   Helane Briceno A. Dossie Arbour, MD    Order Specific Question:   Indication/Reason    Answer:   Chronic shoulder pain secondary to shoulder arthropathy   Provide equipment / supplies at bedside    Procedure tray: "Block Tray" (Disposable  single use) Skin infiltration needle: Regular 1.5-in, 25-G, (x1) Block Needle type: Spinal Amount/quantity: 1 Size: Regular (3.5-inch) Gauge: 22G    Standing Status:   Standing    Number of Occurrences:   1    Order Specific Question:   Specify    Answer:   Block Tray   Chronic Opioid Analgesic:  No chronic opioid analgesics therapy prescribed by our practice.  Tramadol 50 mg tablet, 1 tab p.o. daily.  (10 MME/day) by PCP    Medications ordered for  procedure: Meds ordered this encounter  Medications   lidocaine (XYLOCAINE) 2 % (with pres) injection 400 mg   pentafluoroprop-tetrafluoroeth (GEBAUERS) aerosol   lactated ringers infusion   midazolam (VERSED) injection 0.5-2 mg    Make sure Flumazenil is available in the pyxis when using this medication. If oversedation occurs, administer 0.2 mg IV over 15 sec. If after 45 sec no response, administer 0.2 mg again over 1 min; may repeat at 1 min intervals; not to exceed 4 doses (1 mg)   ropivacaine (PF) 2 mg/mL (0.2%) (NAROPIN) injection 9 mL   methylPREDNISolone acetate (DEPO-MEDROL) injection 80 mg   Medications administered: We administered lidocaine, pentafluoroprop-tetrafluoroeth, lactated ringers, midazolam, ropivacaine (PF) 2 mg/mL (0.2%), and methylPREDNISolone acetate.  See the medical record for exact dosing, route, and time of administration.  Follow-up plan:   Return in about 2 weeks (around 10/26/2022) for Proc-day (T,Th), (F2F), (PPE).        Interventional Therapies  Risk Factors  Considerations:  ALLERGY: Prednisone (okay w/ other steroids)  baclofen  gabapentin  pregabalin  oxycodone  tizanidine  PNC MO  GERD  HTN  anxiety NOTE: NO further RFA attempts until she brings her BMI<35. (Technically difficult procedure due to intra-procedural non-compliance and very unreliable feedback.)    Planned  Pending:   Diagnostic/Therapeutic Left biceps tendon inj. #1    Under consideration:   Diagnostic Midline L4-5 interspinous process inj. #1  NO RFA (BMI)    Completed:   Therapeutic right lumbar facet MBB x3 (09/14/2020) (100/100/100/90-100)  Therapeutic left lumbar facet MBB x2 (06/12/2019) (100/100/100 x 3 days/90-100)  Therapeutic right lumbar facet RFA x1 (08/07/2019) (100/100/50/50)  Therapeutic left lumbar facet  RFA x1 (09/02/2019) (100/100/100/90-100)  Diagnostic/therapeutic right SI Blk x1 (09/14/2020) (100/100/100/90-100)    Completed by other providers:    None at this time   Therapeutic  Palliative (PRN) options:   Diagnostic bilateral lumbar facet MBB     Pharmacotherapy  Nonopioids transferred 08/18/2020: Calcium, Lyrica, and Zanaflex.  Patient informed that we will not be able to take over her opioid analgesic management.       Recent Visits Date Type Provider Dept  10/02/22 Office Visit Delano Metz, MD [facility]  Showing recent visits within past 90 days and meeting all other requirements Today's Visits Date Type Provider Dept  10/12/22 Procedure visit Delano Metz, MD [facility]  Showing today's visits and meeting all other requirements Future Appointments Date Type Provider Dept  10/26/22 Appointment Delano Metz, MD [facility]  Showing future appointments within next 90 days and meeting all other requirements  Disposition: Discharge home  Discharge (Date  Time): 10/12/2022;   hrs.   Primary Care Physician: Smitty Cords, DO Location: [facility] Note by: Oswaldo Done, MD Date: 10/12/2022; Time: 10:31 AM  Disclaimer:  Medicine is not an exact science. The only guarantee in medicine is that nothing is guaranteed. It is important to note that the decision to proceed with this intervention was based on the information collected from the patient. The Data and conclusions were drawn from the patient's questionnaire, the interview, and the physical examination. Because the information was provided in large part by the patient, it cannot be guaranteed that it has not been purposely or unconsciously manipulated. Every effort has been made to obtain as much relevant data as possible for this evaluation. It is important to note that the conclusions that lead to this procedure are derived in large part from the available data. Always take into account that the treatment will also be dependent on availability of resources and existing  treatment guidelines, considered by other Pain Management Practitioners as being common knowledge and practice, at the time of the intervention. For Medico-Legal purposes, it is also important to point out that variation in procedural techniques and pharmacological choices are the acceptable norm. The indications, contraindications, technique, and results of the above procedure should only be interpreted and judged by a Board-Certified Interventional Pain Specialist with extensive familiarity and expertise in the same exact procedure and technique.

## 2022-10-12 ENCOUNTER — Ambulatory Visit: Payer: Medicare HMO | Attending: Pain Medicine | Admitting: Pain Medicine

## 2022-10-12 ENCOUNTER — Ambulatory Visit
Admission: RE | Admit: 2022-10-12 | Discharge: 2022-10-12 | Disposition: A | Discharge status: Home / Self Care | Payer: Medicare HMO | Source: Ambulatory Visit | Attending: Pain Medicine | Admitting: Pain Medicine

## 2022-10-12 ENCOUNTER — Encounter: Payer: Self-pay | Admitting: Pain Medicine

## 2022-10-12 VITALS — BP 165/95 | HR 67 | Temp 97.3°F | Resp 16 | Ht 67.0 in | Wt 260.0 lb

## 2022-10-12 DIAGNOSIS — M25512 Pain in left shoulder: Secondary | ICD-10-CM | POA: Diagnosis not present

## 2022-10-12 DIAGNOSIS — M7522 Bicipital tendinitis, left shoulder: Secondary | ICD-10-CM | POA: Insufficient documentation

## 2022-10-12 DIAGNOSIS — G8929 Other chronic pain: Secondary | ICD-10-CM | POA: Diagnosis not present

## 2022-10-12 DIAGNOSIS — M19012 Primary osteoarthritis, left shoulder: Secondary | ICD-10-CM | POA: Insufficient documentation

## 2022-10-12 MED ORDER — ROPIVACAINE HCL 2 MG/ML IJ SOLN
9.0000 mL | Freq: Once | INTRAMUSCULAR | Status: AC
  Administered 2022-10-12: 9 mL via INTRA_ARTICULAR
  Filled 2022-10-12: qty 20

## 2022-10-12 MED ORDER — METHYLPREDNISOLONE ACETATE 80 MG/ML IJ SUSP
80.0000 mg | Freq: Once | INTRAMUSCULAR | Status: AC
  Administered 2022-10-12: 80 mg via INTRA_ARTICULAR
  Filled 2022-10-12: qty 1

## 2022-10-12 MED ORDER — LIDOCAINE HCL 2 % IJ SOLN
20.0000 mL | Freq: Once | INTRAMUSCULAR | Status: AC
  Administered 2022-10-12: 400 mg
  Filled 2022-10-12: qty 20

## 2022-10-12 MED ORDER — PENTAFLUOROPROP-TETRAFLUOROETH EX AERO
INHALATION_SPRAY | Freq: Once | CUTANEOUS | Status: AC
  Administered 2022-10-12: 1 via TOPICAL
  Filled 2022-10-12: qty 116

## 2022-10-12 MED ORDER — MIDAZOLAM HCL 2 MG/2ML IJ SOLN
0.5000 mg | Freq: Once | INTRAMUSCULAR | Status: AC
  Administered 2022-10-12: 2 mg via INTRAVENOUS
  Filled 2022-10-12: qty 2

## 2022-10-12 MED ORDER — LACTATED RINGERS IV SOLN: Freq: Once | INTRAVENOUS | Status: AC

## 2022-10-12 NOTE — Patient Instructions (Signed)
____________________________________________________________________________________________  Post-Procedure Discharge Instructions  Instructions:  Apply ice:   Purpose: This will minimize any swelling and discomfort after procedure.   When: Day of procedure, as soon as you get home.  How: Fill a plastic sandwich bag with crushed ice. Cover it with a small towel and apply to injection site.  How long: (15 min on, 15 min off) Apply for 15 minutes then remove x 15 minutes.  Repeat sequence on day of procedure, until you go to bed.  Apply heat:   Purpose: To treat any soreness and discomfort from the procedure.  When: Starting the next day after the procedure.  How: Apply heat to procedure site starting the day following the procedure.  How long: May continue to repeat daily, until discomfort goes away.  Food intake: Start with clear liquids (like water) and advance to regular food, as tolerated.   Physical activities: Keep activities to a minimum for the first 8 hours after the procedure. After that, then as tolerated.  Driving: If you have received any sedation, be responsible and do not drive. You are not allowed to drive for 24 hours after having sedation.  Blood thinner: (Applies only to those taking blood thinners) You may restart your blood thinner 6 hours after your procedure.  Insulin: (Applies only to Diabetic patients taking insulin) As soon as you can eat, you may resume your normal dosing schedule.  Infection prevention: Keep procedure site clean and dry. Shower daily and clean area with soap and water.  Post-procedure Pain Diary: Extremely important that this be done correctly and accurately. Recorded information will be used to determine the next step in treatment. For the purpose of accuracy, follow these rules:  Evaluate only the area treated. Do not report or include pain from an untreated area. For the purpose of this evaluation, ignore all other areas of pain,  except for the treated area.  After your procedure, avoid taking a long nap and attempting to complete the pain diary after you wake up. Instead, set your alarm clock to go off every hour, on the hour, for the initial 8 hours after the procedure. Document the duration of the numbing medicine, and the relief you are getting from it.  Do not go to sleep and attempt to complete it later. It will not be accurate. If you received sedation, it is likely that you were given a medication that may cause amnesia. Because of this, completing the diary at a later time may cause the information to be inaccurate. This information is needed to plan your care.  Follow-up appointment: Keep your post-procedure follow-up evaluation appointment after the procedure (usually 2 weeks for most procedures, 6 weeks for radiofrequencies). DO NOT FORGET to bring you pain diary with you.   Expect: (What should I expect to see with my procedure?)  From numbing medicine (AKA: Local Anesthetics): Numbness or decrease in pain. You may also experience some weakness, which if present, could last for the duration of the local anesthetic.  Onset: Full effect within 15 minutes of injected.  Duration: It will depend on the type of local anesthetic used. On the average, 1 to 8 hours.   From steroids (Applies only if steroids were used): Decrease in swelling or inflammation. Once inflammation is improved, relief of the pain will follow.  Onset of benefits: Depends on the amount of swelling present. The more swelling, the longer it will take for the benefits to be seen. In some cases, up to 10 days.    may typically last 2 weeks (the duration of the steroids). Frequent: Cramps (if they occur, drink Gatorade and take over-the-counter Magnesium 450-500 mg once to twice a day); water retention with temporary  weight gain; increases in blood sugar; decreased immune system response; increased appetite. Occasional: Facial flushing (red, warm cheeks); mood swings; menstrual changes. Uncommon: Long-term decrease or suppression of natural hormones; bone thinning. (These are more common with higher doses or more frequent use. This is why we prefer that our patients avoid having any injection therapies in other practices.)  Very Rare: Severe mood changes; psychosis; aseptic necrosis. From procedure: Some discomfort is to be expected once the numbing medicine wears off. This should be minimal if ice and heat are applied as instructed.  Call if: (When should I call?) You experience numbness and weakness that gets worse with time, as opposed to wearing off. New onset bowel or bladder incontinence. (Applies only to procedures done in the spine)  Emergency Numbers: Durning business hours (Monday - Thursday, 8:00 AM - 4:00 PM) (Friday, 9:00 AM - 12:00 Noon): [phone] After hours: [phone] NOTE: If you are having a problem and are unable connect with, or to talk to a provider, then go to your nearest urgent care or emergency department. If the problem is serious and urgent, please call 911. ____________________________________________________________________________________________  ____________________________________________________________________________________________  Patient Information update  To: All of our patients.  Re: Name change.  It has been made official that our current name, "[facility]"   will soon be changed to "[facility]".   The purpose of this change is to eliminate any confusion created by the concept of our practice being a "Medication Management Pain Clinic". In the past this has led to the misconception that we treat pain primarily by the use of prescription  medications.  Nothing can be farther from the truth.   Understanding PAIN MANAGEMENT: To further understand what our practice does, you first have to understand that "Pain Management" is a subspecialty that requires additional training once a physician has completed their specialty training, which can be in either Anesthesia, Neurology, Psychiatry, or Physical Medicine and Rehabilitation (PMR). Each one of these contributes to the final approach taken by each physician to the management of their patient's pain. To be a "Pain Management Specialist" you must have first completed one of the specialty trainings below.  Anesthesiologists - trained in clinical pharmacology and interventional techniques such as nerve blockade and regional as well as central neuroanatomy. They are trained to block pain before, during, and after surgical interventions.  Neurologists - trained in the diagnosis and pharmacological treatment of complex neurological conditions, such as Multiple Sclerosis, Parkinson's, spinal cord injuries, and other systemic conditions that may be associated with symptoms that may include but are not limited to pain. They tend to rely primarily on the treatment of chronic pain using prescription medications.  Psychiatrist - trained in conditions affecting the psychosocial wellbeing of patients including but not limited to depression, anxiety, schizophrenia, personality disorders, addiction, and other substance use disorders that may be associated with chronic pain. They tend to rely primarily on the treatment of chronic pain using prescription medications.   Physical Medicine and Rehabilitation (PMR) physicians, also known as physiatrists - trained to treat a wide variety of medical conditions affecting the brain, spinal cord, nerves, bones, joints, ligaments, muscles, and tendons. Their training is primarily aimed at treating patients that have suffered injuries   that have caused severe physical  impairment. Their training is primarily aimed at the physical therapy and rehabilitation of those patients. They may also work alongside orthopedic surgeons or neurosurgeons using their expertise in assisting surgical patients to recover after their surgeries.  INTERVENTIONAL PAIN MANAGEMENT is sub-subspecialty of Pain Management.  Our physicians are Board-certified in Anesthesia, Pain Management, and Interventional Pain Management.  This meaning that not only have they been trained and Board-certified in their specialty of Anesthesia, and subspecialty of Pain Management, but they have also received further training in the sub-subspecialty of Interventional Pain Management, in order to become Board-certified as INTERVENTIONAL PAIN MANAGEMENT SPECIALIST.    Mission: Our goal is to use our skills in  INTERVENTIONAL PAIN MANAGEMENT as alternatives to the chronic use of prescription opioid medications for the treatment of pain. To make this more clear, we have changed our name to reflect what we do and offer. We will continue to offer medication management assessment and recommendations, but we will not be taking over any patient's medication management.  ____________________________________________________________________________________________   

## 2022-10-12 NOTE — Progress Notes (Signed)
Safety precautions to be maintained throughout the outpatient stay will include: orient to surroundings, keep bed in low position, maintain call bell within reach at all times, provide assistance with transfer out of bed and ambulation.  

## 2022-10-13 ENCOUNTER — Telehealth: Payer: Self-pay

## 2022-10-13 NOTE — Telephone Encounter (Signed)
Post procedure follow up.  LM 

## 2022-10-16 ENCOUNTER — Telehealth: Payer: Self-pay | Admitting: Pain Medicine

## 2022-10-16 NOTE — Telephone Encounter (Signed)
Spoke with Dr Dossie Arbour.  Informed patient to follow up with PCP, and to not change positions suddenly.  Patient states pain is gone from procedure. Patient notifed and will follow up with PCP.

## 2022-10-16 NOTE — Telephone Encounter (Signed)
Spoke with patient and she feels swimmy headed when going from sitting to standing.  States she has been having this feeling since her procedure.  Senies any other symptoms.  Will notify Dr Dossie Arbour.

## 2022-10-16 NOTE — Telephone Encounter (Signed)
Patient states she had procedure last week and pain is better. She is having a lot of balance problems. She wants to talk to a nurse.

## 2022-10-16 NOTE — Telephone Encounter (Signed)
This message handled by [facility]

## 2022-10-16 NOTE — Telephone Encounter (Signed)
PT called stated that she had an procedure done on last Thursday. PT stated that she needs an nurse to give her a call she has some questions to ask. Thanks

## 2022-10-17 ENCOUNTER — Ambulatory Visit (INDEPENDENT_AMBULATORY_CARE_PROVIDER_SITE_OTHER): Payer: Medicare HMO | Admitting: Family Medicine

## 2022-10-17 ENCOUNTER — Encounter: Payer: Self-pay | Admitting: Family Medicine

## 2022-10-17 ENCOUNTER — Telehealth: Payer: Self-pay | Admitting: Family Medicine

## 2022-10-17 VITALS — BP 126/82 | HR 80 | Ht 67.0 in | Wt 255.0 lb

## 2022-10-17 DIAGNOSIS — G894 Chronic pain syndrome: Secondary | ICD-10-CM | POA: Diagnosis not present

## 2022-10-17 DIAGNOSIS — R7303 Prediabetes: Secondary | ICD-10-CM

## 2022-10-17 DIAGNOSIS — M25562 Pain in left knee: Secondary | ICD-10-CM | POA: Diagnosis not present

## 2022-10-17 DIAGNOSIS — G8929 Other chronic pain: Secondary | ICD-10-CM

## 2022-10-17 DIAGNOSIS — H8110 Benign paroxysmal vertigo, unspecified ear: Secondary | ICD-10-CM | POA: Diagnosis not present

## 2022-10-17 DIAGNOSIS — M545 Low back pain, unspecified: Secondary | ICD-10-CM | POA: Diagnosis not present

## 2022-10-17 DIAGNOSIS — M5136 Other intervertebral disc degeneration, lumbar region: Secondary | ICD-10-CM

## 2022-10-17 NOTE — Patient Instructions (Addendum)
Thank you for coming to the office today.  Let me know if need more Tramadol ordered and where to send it. [facility] mail order if you need.  CHRONIC PAIN MANAGEMENT   [facility] Address: [place], [place], [place] [address] Phone: [phone]   [facility] [place] Ph: [phone] Fax: [phone]   [facility] [place], [place] [address] [place], [place] [address] Phone: [phone] Fax: [phone]   [place] [facility] [place],  [address] Ph: [phone] (Multiple locations in [place] / [place])   [facility] [place] [address] [place],   [address] Appointments: [phone]   [facility] Address: [address], [place],  [address] Phone: [phone]  You have symptoms of Vertigo (Benign Paroxysmal Positional Vertigo) - This is commonly caused by inner ear fluid imbalance, sometimes can be worsened by allergies and sinus symptoms, otherwise it can occur randomly sometimes and we may never discover the exact cause. - To treat this, try the Epley Manuever (see diagrams/instructions below) at home up to 3 times a day for 1-2 weeks or until symptoms resolve  If you develop significant worsening episode with vertigo that does not improve and you get severe headache, loss of vision, arm or leg weakness, slurred speech, or other concerning symptoms please seek immediate medical attention at Emergency Department.  Please schedule a follow-up appointment with Dr Parks Ranger within 4 weeks if Vertigo not improving, and will consider Referral to Vestibular Rehab  See the next page for images describing the Epley  Manuever.     ----------------------------------------------------------------------------------------------------------------------          Please schedule a Follow-up Appointment to: Return in about 3 months (around 01/16/2023).  If you have any other questions or concerns, please feel free to call the office or send a message through [place]. You may also schedule an earlier appointment if necessary.  Additionally, you may be receiving a survey about your experience at our office within a few days to 1 week by e-mail or mail. We value your feedback.  Nobie Putnam, DO [place]

## 2022-10-17 NOTE — Telephone Encounter (Signed)
Pt has called back with the info that Dr Raliegh Ip had requested. Pt states that the Pain Management Clinic info is as follows [place]  [phone]  Pt states they are in her network.

## 2022-10-17 NOTE — Progress Notes (Signed)
Subjective:    Patient ID: Megan Perez, female    DOB: 01/17/1946, 77 y.o.   MRN: [id number]  Megan Perez is a 77 y.o. female presenting on 10/17/2022 for Dizziness   HPI  Chronic Pain Lumbar DDD Chronic Back Pain Osteoarthritis multiple joints, chronic knee pain S/p TKR   Previously followed by Pain [facility] Dr Dossie Arbour She does not want to pursue higher dose pain meds or procedures / surgery, she has tried injections without significant relief. The sedative / numbing without improvement.   She takes Tramadol 50mg  nightly she was given 30 pill count nightly and refills.   Previous TKR L side years ago, that Dr is deceased. She had L sided TKR 02/14/09, by Dr Candee Furbish at [facility], [place]   More recent history R TKR by Dr [facility].   She is having a hard time sleeping due to pain. She takes Tylenol 650mg  x 2 = 1300 Cannot take NSAID. History of GI Bleed. Allergic reaction or intolerance to Gabapentin, PreGabalin, Tizanidine, Baclofen  Updates now  Reports symptoms started this past Thursday 10/12/22, she has seen Dr Dossie Arbour for L shoulder pain, she had anxiety with procedure, and had elevated BP due to medication injection.  She admits worse when first stand up in morning will have dizziness and vertigo symptoms with room spinning more.  Tramadol has helped, no longer helpful from the injections  Interested in pain management 2nd opinion other location         10/12/2022    9:58 AM 08/15/2022    2:26 PM 07/28/2022   11:34 AM  Depression screen PHQ 2/9  Decreased Interest 0 0 0  Down, Depressed, Hopeless 0 0 0  PHQ - 2 Score 0 0 0  Altered sleeping  0 0  Tired, decreased energy  0 0  Change in appetite  0 0  Feeling bad or failure about yourself   0 0  Trouble concentrating  0 0  Moving slowly or fidgety/restless  0 0  Suicidal thoughts  0 0  PHQ-9 Score  0 0  Difficult doing work/chores  Not difficult at all  Not difficult at all    Social History   Tobacco Use   Smoking status: Never   Smokeless tobacco: Never  Vaping Use   Vaping Use: Never used  Substance Use Topics   Alcohol use: No   Drug use: No    Review of Systems Per HPI unless specifically indicated above     Objective:    BP 126/82   Pulse 80   Ht 5\' 7"  (1.702 m)   Wt 255 lb (115.7 kg)   SpO2 98%   BMI 39.94 kg/m   Wt Readings from Last 3 Encounters:  10/17/22 255 lb (115.7 kg)  10/12/22 260 lb (117.9 kg)  10/02/22 268 lb (121.6 kg)    Physical Exam Vitals and nursing note reviewed.  Constitutional:      General: She is not in acute distress.    Appearance: She is well-developed. She is obese. She is not diaphoretic.     Comments: Well-appearing, comfortable, cooperative  HENT:     Head: Normocephalic and atraumatic.  Eyes:     General:        Right eye: No discharge.        Left eye: No discharge.     Conjunctiva/sclera: Conjunctivae normal.     Pupils: Pupils are equal, round, and reactive to light.  Neck:     Thyroid: No thyromegaly.  Cardiovascular:     Rate and Rhythm: Normal rate and regular rhythm.     Pulses: Normal pulses.     Heart sounds: Normal heart sounds. No murmur heard. Pulmonary:     Effort: Pulmonary effort is normal. No respiratory distress.     Breath sounds: Normal breath sounds. No wheezing or rales.  Abdominal:     General: Bowel sounds are normal. There is no distension.     Palpations: Abdomen is soft. There is no mass.     Tenderness: There is no abdominal tenderness.  Musculoskeletal:        General: No tenderness. Normal range of motion.     Cervical back: Normal range of motion and neck supple.     Right lower leg: Edema present.     Left lower leg: Edema present.     Comments: Upper / Lower Extremities: - Normal muscle tone, strength bilateral upper extremities 5/5, lower extremities 5/5  Lymphadenopathy:     Cervical: No cervical adenopathy.  Skin:    General:  Skin is warm and dry.     Findings: No erythema or rash.  Neurological:     Mental Status: She is alert and oriented to person, place, and time.     Comments: Distal sensation intact to light touch all extremities  Psychiatric:        Mood and Affect: Mood normal.        Behavior: Behavior normal.        Thought Content: Thought content normal.     Comments: Well groomed, good eye contact, normal speech and thoughts    Results for orders placed or performed in visit on 07/28/22  HgB A1c  Result Value Ref Range   Hgb A1c MFr Bld 6.3 (H) <5.7 % of total Hgb   Mean Plasma Glucose 134 mg/dL   eAG (mmol/L) 7.4 mmol/L  Lipid panel  Result Value Ref Range   Cholesterol 185 <200 mg/dL   HDL 65 > OR = 50 mg/dL   Triglycerides 66 <150 mg/dL   LDL Cholesterol (Calc) 105 (H) mg/dL (calc)   Total CHOL/HDL Ratio 2.8 <5.0 (calc)   Non-HDL Cholesterol (Calc) 120 <130 mg/dL (calc)  Comprehensive Metabolic Panel (CMET)  Result Value Ref Range   Glucose, Bld 104 (H) 65 - 99 mg/dL   BUN 21 7 - 25 mg/dL   Creat 0.90 0.60 - 1.00 mg/dL   BUN/Creatinine Ratio SEE NOTE: 6 - 22 (calc)   Sodium 142 135 - 146 mmol/L   Potassium 4.3 3.5 - 5.3 mmol/L   Chloride 105 98 - 110 mmol/L   CO2 27 20 - 32 mmol/L   Calcium 9.3 8.6 - 10.4 mg/dL   Total Protein 6.9 6.1 - 8.1 g/dL   Albumin 4.2 3.6 - 5.1 g/dL   Globulin 2.7 1.9 - 3.7 g/dL (calc)   AG Ratio 1.6 1.0 - 2.5 (calc)   Total Bilirubin 0.3 0.2 - 1.2 mg/dL   Alkaline phosphatase (APISO) 70 37 - 153 U/L   AST 9 (L) 10 - 35 U/L   ALT 7 6 - 29 U/L  CBC with Differential  Result Value Ref Range   WBC 4.2 3.8 - 10.8 Thousand/uL   RBC 3.85 3.80 - 5.10 Million/uL   Hemoglobin 11.9 11.7 - 15.5 g/dL   HCT 36.3 35.0 - 45.0 %   MCV 94.3 80.0 - 100.0 fL   MCH 30.9 27.0 - 33.0 pg  MCHC 32.8 32.0 - 36.0 g/dL   RDW 13.0 11.0 - 15.0 %   Platelets 294 140 - 400 Thousand/uL   MPV 10.2 7.5 - 12.5 fL   Neutro Abs 1,869 1,500 - 7,800 cells/uL   Lymphs Abs 1,924  850 - 3,900 cells/uL   Absolute Monocytes 256 200 - 950 cells/uL   Eosinophils Absolute 101 15 - 500 cells/uL   Basophils Absolute 50 0 - 200 cells/uL   Neutrophils Relative % 44.5 %   Total Lymphocyte 45.8 %   Monocytes Relative 6.1 %   Eosinophils Relative 2.4 %   Basophils Relative 1.2 %  TSH  Result Value Ref Range   TSH 2.03 0.40 - 4.50 mIU/L      Assessment & Plan:   Problem List Items Addressed This Visit     Chronic pain syndrome (Chronic)   DDD (degenerative disc disease), lumbar - Primary (Chronic)   Prediabetes   Other Visit Diagnoses     Chronic pain of left knee       Benign paroxysmal positional vertigo, unspecified laterality       Chronic midline low back pain without sciatica           Vertigo BPPV Chronic recurrent problem May be related to fluctuation in her BP, sit to standing Caution with sudden position change Allergy to Meclizine Restart Home Epley Maneuvers for Vertigo  Chronic Pain DDD Lumbar Bilateral Knee Pain, L>R Back Pain Followed by [facility] for procedural intervention. She has not had successful results with injection pain management She plans to consider 2nd opinion other location She has been managed by me as PCP on Tramadol low dose AS NEEDED mostly in PM I will agree to continue her Tramadol as we locate other referral for her.  Notify office when she is able to locate other in network provider  No orders of the defined types were placed in this encounter.    Follow up plan: Return in about 3 months (around 01/16/2023).  Nobie Putnam, [facility] 10/17/2022, 3:10 PM

## 2022-10-17 NOTE — Addendum Note (Signed)
Addended by: Olin Hauser on: 10/17/2022 06:31 PM   Modules accepted: Level of Service

## 2022-10-18 NOTE — Telephone Encounter (Signed)
Referral submitted today  to   [facility] [place], [place]  [address] Appointments: [phone]  Nobie Putnam, [facility] 10/18/2022, 10:25 AM

## 2022-10-24 ENCOUNTER — Other Ambulatory Visit: Payer: Self-pay | Admitting: Family Medicine

## 2022-10-24 DIAGNOSIS — I10 Essential (primary) hypertension: Secondary | ICD-10-CM

## 2022-10-25 NOTE — Telephone Encounter (Signed)
Unable to refill per protocol, Rx request is too soon. Last refill 09/25/22 for 90 and 1 refills.  Requested Prescriptions  Pending Prescriptions Disp Refills   lisinopril (ZESTRIL) 20 MG tablet [Pharmacy Med Name: LISINOPRIL 20MG  TABLETS] 90 tablet 1    Sig: TAKE 1 TABLET(20 MG) BY MOUTH DAILY     Cardiovascular:  ACE Inhibitors Passed - 10/24/2022  7:06 AM      Passed - Cr in normal range and within 180 days    Creat  Date Value Ref Range Status  07/31/2022 0.90 0.60 - 1.00 mg/dL Final         Passed - K in normal range and within 180 days    Potassium  Date Value Ref Range Status  07/31/2022 4.3 3.5 - 5.3 mmol/L Final         Passed - Patient is not pregnant      Passed - Last BP in normal range    BP Readings from Last 1 Encounters:  10/17/22 126/82         Passed - Valid encounter within last 6 months    Recent Outpatient Visits           1 week ago DDD (degenerative disc disease), lumbar   [place], DO   2 months ago Annual physical exam   [facility] [place], Devonne Doughty, DO   4 months ago Trigger finger, right ring finger   [facility] [place], Coralie Keens, NP   6 months ago Chronic pain syndrome   [place], DO   8 months ago Pain and swelling of left knee   [facility], Devonne Doughty, DO       Future Appointments             In 3 weeks Parks Ranger, Devonne Doughty, DO [facility]

## 2022-10-26 ENCOUNTER — Ambulatory Visit: Payer: Medicare HMO | Admitting: Pain Medicine

## 2022-11-02 ENCOUNTER — Encounter: Payer: Self-pay | Admitting: Internal Medicine

## 2022-11-02 ENCOUNTER — Ambulatory Visit (INDEPENDENT_AMBULATORY_CARE_PROVIDER_SITE_OTHER): Payer: Medicare HMO | Admitting: Internal Medicine

## 2022-11-02 ENCOUNTER — Ambulatory Visit: Payer: Self-pay

## 2022-11-02 VITALS — BP 136/72 | HR 80 | Temp 96.4°F | Wt 263.0 lb

## 2022-11-02 DIAGNOSIS — R300 Dysuria: Secondary | ICD-10-CM | POA: Diagnosis not present

## 2022-11-02 LAB — POCT URINALYSIS DIPSTICK
Bilirubin, UA: NEGATIVE
Glucose, UA: NEGATIVE
Ketones, UA: NEGATIVE
Nitrite, UA: NEGATIVE
Protein, UA: NEGATIVE
Spec Grav, UA: <=1.005 — AB (ref 1.010–1.025)
Urobilinogen, UA: 0.2 E.U./dL
pH, UA: 8.5 — AB (ref 5.0–8.0)

## 2022-11-02 MED ORDER — NITROFURANTOIN MONOHYD MACRO 100 MG PO CAPS: 100.0000 mg | ORAL_CAPSULE | Freq: Two times a day (BID) | ORAL | 0 refills | Status: DC

## 2022-11-02 NOTE — Telephone Encounter (Signed)
Message from [employer] sent at 11/02/2022  8:13 AM EST  Summary: urinary discomfort / rx req   The patient shares that they have experienced discomfort when urinating for roughly two days  The patient has no additional symptoms  The patient would like to be prescribed something for their discomfort  Please contact further when possible         Chief Complaint: burning with urination Symptoms: urgency Frequency: yesterday Pertinent Negatives: Patient denies feer, blood in urine, flank pain Disposition: []$ ED /[]$ Urgent Care (no appt availability in office) / [x]$ Appointment(In office/virtual)/ []$  [place] [facility]/ []$ Home Care/ []$ Refused Recommended Disposition /[]$  Mobile Bus/ []$  Follow-up with PCP Additional Notes: pt going  of town tomorrow early am. No openings with PCP, Seeing R. Baity NPtoday.   Reason for Disposition  Patient is worried they have a sexually transmitted infection (STI)  Answer Assessment - Initial Assessment Questions 1. SEVERITY: "How bad is the pain?"  (e.g., Scale 1-10; mild, moderate, or severe)   - MILD (1-3): complains slightly about urination hurting   - MODERATE (4-7): interferes with normal activities     - SEVERE (8-10): excruciating, unwilling or unable to urinate because of the pain      mild 2. FREQUENCY: "How many times have you had painful urination today?"      2 3. PATTERN: "Is pain present every time you urinate or just sometimes?"      Every time 4. ONSET: "When did the painful urination start?"      1 day  5. FEVER: "Do you have a fever?" If Yes, ask: "What is your temperature, how was it measured, and when did it start?"     no 6. PAST UTI: "Have you had a urine infection before?" If Yes, ask: "When was the last time?" and "What happened that time?"      Yes- years ago - abx and prtididmine 7. CAUSE: "What do you think is causing the painful urination?"  (e.g., UTI, scratch, Herpes sore)     UTI 8. OTHER  SYMPTOMS: "Do you have any other symptoms?" (e.g., blood in urine, flank pain, genital sores, urgency, vaginal discharge)     Urgency  9. PREGNANCY: "Is there any chance you are pregnant?" "When was your last menstrual period?"     N/a  Protocols used: Urination Pain - Female-A-AH

## 2022-11-02 NOTE — Patient Instructions (Signed)
Urinary Tract Infection, Adult A urinary tract infection (UTI) is an infection of any part of the urinary tract. The urinary tract includes: The kidneys. The ureters. The bladder. The urethra. These organs make, store, and get rid of pee (urine) in the body. What are the causes? This infection is caused by germs (bacteria) in your genital area. These germs grow and cause swelling (inflammation) of your urinary tract. What increases the risk? The following factors may make you more likely to develop this condition: Using a small, thin tube (catheter) to drain pee. Not being able to control when you pee or poop (incontinence). Being female. If you are female, these things can increase the risk: Using these methods to prevent pregnancy: A medicine that kills sperm (spermicide). A device that blocks sperm (diaphragm). Having low levels of a female hormone (estrogen). Being pregnant. You are more likely to develop this condition if: You have genes that add to your risk. You are sexually active. You take antibiotic medicines. You have trouble peeing because of: A prostate that is bigger than normal, if you are female. A blockage in the part of your body that drains pee from the bladder. A kidney stone. A nerve condition that affects your bladder. Not getting enough to drink. Not peeing often enough. You have other conditions, such as: Diabetes. A weak disease-fighting system (immune system). Sickle cell disease. Gout. Injury of the spine. What are the signs or symptoms? Symptoms of this condition include: Needing to pee right away. Peeing small amounts often. Pain or burning when peeing. Blood in the pee. Pee that smells bad or not like normal. Trouble peeing. Pee that is cloudy. Fluid coming from the vagina, if you are female. Pain in the belly or lower back. Other symptoms include: Vomiting. Not feeling hungry. Feeling mixed up (confused). This may be the first symptom in  older adults. Being tired and grouchy (irritable). A fever. Watery poop (diarrhea). How is this treated? Taking antibiotic medicine. Taking other medicines. Drinking enough water. In some cases, you may need to see a specialist. Follow these instructions at home:  Medicines Take over-the-counter and prescription medicines only as told by your doctor. If you were prescribed an antibiotic medicine, take it as told by your doctor. Do not stop taking it even if you start to feel better. General instructions Make sure you: Pee until your bladder is empty. Do not hold pee for a long time. Empty your bladder after sex. Wipe from front to back after peeing or pooping if you are a female. Use each tissue one time when you wipe. Drink enough fluid to keep your pee pale yellow. Keep all follow-up visits. Contact a doctor if: You do not get better after 1-2 days. Your symptoms go away and then come back. Get help right away if: You have very bad back pain. You have very bad pain in your lower belly. You have a fever. You have chills. You feeling like you will vomit or you vomit. Summary A urinary tract infection (UTI) is an infection of any part of the urinary tract. This condition is caused by germs in your genital area. There are many risk factors for a UTI. Treatment includes antibiotic medicines. Drink enough fluid to keep your pee pale yellow. This information is not intended to replace advice given to you by your health care provider. Make sure you discuss any questions you have with your health care provider. Document Revised: 04/16/2020 Document Reviewed: 04/16/2020 Elsevier Patient Education    2023 Elsevier Inc.  

## 2022-11-02 NOTE — Progress Notes (Signed)
HPI  Pt presents to the clinic today with c/o burning with urination. This started 2 days ago.  She denies urgency, frequency, dysuria or blood in her urine.  She denies vaginal symptoms.  She denies fever, chills, nausea or vomiting.  She has not tried anything OTC for this.   Review of Systems  Past Medical History:  Diagnosis Date   Anxiety    Asthma    reactive airway and shortness of breath w/ exertion in hot temperatures   Bronchitis    GERD (gastroesophageal reflux disease)    Hypertension    Morbid obesity with BMI of 40.0-44.9, adult ([place]) 04/08/2012   OA (osteoarthritis)    knee   Osteoporosis    Prediabetes    Sinus infection    10/2018   Venous (peripheral) insufficiency 08/19/2010   Qualifier: Diagnosis of  By: Wynetta Emery MD, [place]      Family History  Adopted: Yes  Problem Relation Age of Onset   Heart disease Father    Subarachnoid hemorrhage Mother    Hypertension Mother    Subarachnoid hemorrhage Sister    Hypertension Sister    Hypertension Sister    Hypertension Sister    Healthy Sister    Breast cancer Neg Hx     Social History   Socioeconomic History   Marital status: Widowed    Spouse name: Not on file   Number of children: Not on file   Years of education: Not on file   Highest education level: Associate degree: academic program  Occupational History   Occupation: retired  Tobacco Use   Smoking status: Never   Smokeless tobacco: Never  Vaping Use   Vaping Use: Never used  Substance and Sexual Activity   Alcohol use: No   Drug use: No   Sexual activity: Not Currently    Birth control/protection: Surgical  Other Topics Concern   Not on file  Social History Narrative   Retired.  Lives alone   Social Determinants of Health   Financial Resource Strain: Low Risk  (07/28/2022)   Overall Financial Resource Strain (CARDIA)    Difficulty of Paying Living Expenses: Not hard at all  Food Insecurity: No Food Insecurity (07/28/2022)    Hunger Vital Sign    Worried About Running Out of Food in the Last Year: Never true    Ran Out of Food in the Last Year: Never true  Transportation Needs: No Transportation Needs (07/28/2022)   PRAPARE - Hydrologist (Medical): No    Lack of Transportation (Non-Medical): No  Physical Activity: Inactive (07/28/2022)   Exercise Vital Sign    Days of Exercise per Week: 0 days    Minutes of Exercise per Session: 0 min  Stress: No Stress Concern Present (07/28/2022)   [place]    Feeling of Stress : Not at all  Social Connections: Moderately Isolated (07/28/2022)   Social Connection and Isolation Panel [NHANES]    Frequency of Communication with Friends and Family: More than three times a week    Frequency of Social Gatherings with Friends and Family: More than three times a week    Attends Religious Services: More than 4 times per year    Active Member of [employer] or Organizations: No    Attends Archivist Meetings: Never    Marital Status: Widowed  Intimate Partner Violence: Not At Risk (07/28/2022)   Humiliation, Afraid, Rape, and Kick questionnaire  Fear of Current or Ex-Partner: No    Emotionally Abused: No    Physically Abused: No    Sexually Abused: No    Allergies  Allergen Reactions   Baclofen     Makes her feel Drunk   Meclizine    Other     Other reaction(s): Other (See Comments) Patient notes there is a pain medication that causes hot flushing sensation in her mouth but cannot recall its name.   Penicillins    Prednisone Nausea And Vomiting   Prevacid [Lansoprazole]     Causes dizziness   Gabapentin Hives and Itching   Oxycodone Rash   Pregabalin Er Other (See Comments)    Cognitive impairment   Tizanidine Hcl Other (See Comments)    Cognitive impairment     Constitutional: Denies fever, malaise, fatigue, headache or abrupt weight changes.   GU: Pt reports  burning with urination. Denies urgency, frequency, dysuria, blood in urine, odor or discharge. Skin: Denies redness, rashes, lesions or ulcercations.   No other specific complaints in a complete review of systems (except as listed in HPI above).    Objective:   Physical Exam  BP 136/72 (BP Location: Left Arm, Patient Position: Sitting, Cuff Size: Large)   Pulse 80   Temp (!) 96.4 F (35.8 C) (Temporal)   Wt 263 lb (119.3 kg)   SpO2 98%   BMI 41.19 kg/m   Wt Readings from Last 3 Encounters:  10/17/22 255 lb (115.7 kg)  10/12/22 260 lb (117.9 kg)  10/02/22 268 lb (121.6 kg)    General: Appears her stated age, obese, in NAD. Cardiovascular: Normal rate.  Pulmonary/Chest: Normal effort . Abdomen: Soft. Normal bowel sounds. No distention or masses noted.  Tender to palpation over the bladder area. No CVA tenderness.        Assessment & Plan:   Burning with Urination:  Urinalysis: small blood, small leuks Will send urine culture eRx sent if for Macrobid 100 mg BID x 5 days OK to take AZO OTC Drink plenty of fluids  RTC as needed or if symptoms persist. Webb Silversmith, NP

## 2022-11-05 LAB — URINE CULTURE
MICRO NUMBER:: 14570564
SPECIMEN QUALITY:: ADEQUATE

## 2022-11-06 ENCOUNTER — Ambulatory Visit: Payer: Self-pay

## 2022-11-06 NOTE — Telephone Encounter (Signed)
  Chief Complaint: Swollen ankles Symptoms: swollen ankles Frequency: Sunday Pertinent Negatives: Patient denies Pain Disposition: []$ ED /[]$ Urgent Care (no appt availability in office) / [x]$ Appointment(In office/virtual)/ []$  [place] [facility]/ []$ Home Care/ []$ Refused Recommended Disposition /[]$ [place] [facility]/ []$  Follow-up with PCP Additional Notes: PT started having swelling in her ankles yesterday. Swelling continues today.  PT thinks this may be a reaction to UTI antibiotics prescribed. PT read in medication instructions that this is a possible side effect.  PT will discontinue medication and come for OV in the morning.  PT advised of s/s to seek immediate care.     Summary: Swollen ankles and tingling in toes   The patient called in stating she is having a reaction to her medicine she has been taking due to a Uti. She is on nitrofurantoin, macrocrystal-monohydrate, (MACROBID) 100 MG capsule and she states her ankles are exceedingly swollen.  She states her feet stay swollen and she has always had large ankles but they are much more swollen now. She also states besides holding excess fluid she also is experiencing tingling in her toes on both sides. Please assist patient further      Reason for Disposition  MILD or MODERATE ankle swelling (e.g., can't move joint normally, can't do usual activities) (Exceptions: Itchy, localized swelling; swelling is chronic.)  Answer Assessment - Initial Assessment Questions 1. LOCATION: "Which ankle is swollen?" "Where is the swelling?"     Both ankles 2. ONSET: "When did the swelling start?"     Yesterday 3. SWELLING: "How bad is the swelling?" Or, "How large is it?" (e.g., mild, moderate, severe; size of localized swelling)    - NONE: No joint swelling.   - LOCALIZED: Localized; small area of puffy or swollen skin (e.g., insect bite, skin irritation).   - MILD: Joint looks or feels mildly swollen or puffy.   - MODERATE: Swollen;  interferes with normal activities (e.g., work or school); decreased range of movement; may be limping.   - SEVERE: Very swollen; can't move swollen joint at all; limping a lot or unable to walk.     Moderate 4. PAIN: "Is there any pain?" If Yes, ask: "How bad is it?" (Scale 1-10; or mild, moderate, severe)   - NONE (0): no pain.   - MILD (1-3): doesn't interfere with normal activities.    - MODERATE (4-7): interferes with normal activities (e.g., work or school) or awakens from sleep, limping.    - SEVERE (8-10): excruciating pain, unable to do any normal activities, unable to walk.      No pain - just tingling 5. CAUSE: "What do you think caused the ankle swelling?"     Antibiotic 6. OTHER SYMPTOMS: "Do you have any other symptoms?" (e.g., fever, chest pain, difficulty breathing, calf pain)     no 7. PREGNANCY: "Is there any chance you are pregnant?" "When was your last menstrual period?"     no  Protocols used: Ankle Swelling-A-AH

## 2022-11-07 ENCOUNTER — Ambulatory Visit (INDEPENDENT_AMBULATORY_CARE_PROVIDER_SITE_OTHER): Payer: Medicare HMO | Admitting: Family Medicine

## 2022-11-07 ENCOUNTER — Encounter: Payer: Self-pay | Admitting: Family Medicine

## 2022-11-07 VITALS — BP 126/80 | HR 69 | Ht 67.0 in | Wt 263.2 lb

## 2022-11-07 DIAGNOSIS — N3001 Acute cystitis with hematuria: Secondary | ICD-10-CM

## 2022-11-07 DIAGNOSIS — B379 Candidiasis, unspecified: Secondary | ICD-10-CM

## 2022-11-07 DIAGNOSIS — M5136 Other intervertebral disc degeneration, lumbar region: Secondary | ICD-10-CM

## 2022-11-07 DIAGNOSIS — T3695XA Adverse effect of unspecified systemic antibiotic, initial encounter: Secondary | ICD-10-CM

## 2022-11-07 DIAGNOSIS — I890 Lymphedema, not elsewhere classified: Secondary | ICD-10-CM | POA: Diagnosis not present

## 2022-11-07 MED ORDER — FLUCONAZOLE 150 MG PO TABS: ORAL_TABLET | ORAL | 0 refills | Status: DC

## 2022-11-07 MED ORDER — SULFAMETHOXAZOLE-TRIMETHOPRIM 800-160 MG PO TABS: 1.0000 | ORAL_TABLET | Freq: Two times a day (BID) | ORAL | 0 refills | Status: AC

## 2022-11-07 NOTE — Progress Notes (Signed)
Subjective:    Patient ID: Megan Perez, female    DOB: 01/12/1946, 77 y.o.   MRN: [id number]  Megan Perez is a 77 y.o. female presenting on 11/07/2022 for Joint Swelling (Pt. States that ankle started swelling around previous Saturday. Pt. States that medication made her toes tingly. )   HPI  Ankle Swelling Lymphedema Chronic Lower Extremity Swelling Some improvement with elevation. Today has improved Admits some toe tingling  UTI Seen by Webb Silversmith, FNP here on 11/02/22 for 2 days with dysuria UTI symptoms. She was treated with Macrobid and also was tested and urine culture showed Proteus M infection 100CFU and resistance to Macrobid and Imipenem.  Today here returned for same UTI symptoms. She was advised to stop antibiotic course.  PCN resistance  Needs new antibiotic course at this time.      10/12/2022    9:58 AM 08/15/2022    2:26 PM 07/28/2022   11:34 AM  Depression screen PHQ 2/9  Decreased Interest 0 0 0  Down, Depressed, Hopeless 0 0 0  PHQ - 2 Score 0 0 0  Altered sleeping  0 0  Tired, decreased energy  0 0  Change in appetite  0 0  Feeling bad or failure about yourself   0 0  Trouble concentrating  0 0  Moving slowly or fidgety/restless  0 0  Suicidal thoughts  0 0  PHQ-9 Score  0 0  Difficult doing work/chores  Not difficult at all Not difficult at all    Social History   Tobacco Use   Smoking status: Never   Smokeless tobacco: Never  Vaping Use   Vaping Use: Never used  Substance Use Topics   Alcohol use: No   Drug use: No    Review of Systems Per HPI unless specifically indicated above     Objective:    BP 126/80   Pulse 69   Ht 5' 7"$  (1.702 m)   Wt 263 lb 3.2 oz (119.4 kg)   SpO2 99%   BMI 41.22 kg/m   Wt Readings from Last 3 Encounters:  11/07/22 263 lb 3.2 oz (119.4 kg)  11/02/22 263 lb (119.3 kg)  10/17/22 255 lb (115.7 kg)    Physical Exam Vitals and nursing note reviewed.  Constitutional:      General: She is not  in acute distress.    Appearance: Normal appearance. She is well-developed. She is not diaphoretic.     Comments: Well-appearing, comfortable, cooperative  HENT:     Head: Normocephalic and atraumatic.  Eyes:     General:        Right eye: No discharge.        Left eye: No discharge.     Conjunctiva/sclera: Conjunctivae normal.  Cardiovascular:     Rate and Rhythm: Normal rate.  Pulmonary:     Effort: Pulmonary effort is normal.  Musculoskeletal:     Right lower leg: Edema present.     Left lower leg: Edema present.  Skin:    General: Skin is warm and dry.     Findings: No erythema or rash.  Neurological:     Mental Status: She is alert and oriented to person, place, and time.  Psychiatric:        Mood and Affect: Mood normal.        Behavior: Behavior normal.        Thought Content: Thought content normal.     Comments: Well groomed, good eye contact,  normal speech and thoughts       Results for orders placed or performed in visit on 11/02/22  Urine Culture   Specimen: Urine  Result Value Ref Range   MICRO NUMBER: [id number]    SPECIMEN QUALITY: Adequate    Sample Source URINE, CLEAN CATCH    STATUS: FINAL    ISOLATE 1: Proteus mirabilis (A)       Susceptibility   Proteus mirabilis - URINE CULTURE, REFLEX    AMOX/CLAVULANIC <=2 Sensitive     AMPICILLIN <=2 Sensitive     AMPICILLIN/SULBACTAM <=2 Sensitive     CEFAZOLIN* <=4 Not Reportable      * For infections other than uncomplicated UTI caused by E. coli, K. pneumoniae or P. mirabilis: Cefazolin is resistant if MIC > or = 8 mcg/mL. (Distinguishing susceptible versus intermediate for isolates with MIC < or = 4 mcg/mL requires additional testing.) For uncomplicated UTI caused by E. coli, K. pneumoniae or P. mirabilis: Cefazolin is susceptible if MIC <32 mcg/mL and predicts susceptible to the oral agents cefaclor, cefdinir, cefpodoxime, cefprozil, cefuroxime, cephalexin and loracarbef.     CEFTAZIDIME <=1  Sensitive     CEFEPIME <=1 Sensitive     CEFTRIAXONE <=1 Sensitive     CIPROFLOXACIN <=0.25 Sensitive     LEVOFLOXACIN <=0.12 Sensitive     GENTAMICIN <=1 Sensitive     IMIPENEM 2 Intermediate     NITROFURANTOIN 128 Resistant     PIP/TAZO <=4 Sensitive     TOBRAMYCIN <=1 Sensitive     TRIMETH/SULFA* <=20 Sensitive      * For infections other than uncomplicated UTI caused by E. coli, K. pneumoniae or P. mirabilis: Cefazolin is resistant if MIC > or = 8 mcg/mL. (Distinguishing susceptible versus intermediate for isolates with MIC < or = 4 mcg/mL requires additional testing.) For uncomplicated UTI caused by E. coli, K. pneumoniae or P. mirabilis: Cefazolin is susceptible if MIC <32 mcg/mL and predicts susceptible to the oral agents cefaclor, cefdinir, cefpodoxime, cefprozil, cefuroxime, cephalexin and loracarbef. Legend: S = Susceptible  I = Intermediate R = Resistant  NS = Not susceptible * = Not tested  NR = Not reported **NN = See antimicrobic comments   POCT urinalysis dipstick  Result Value Ref Range   Color, UA     Clarity, UA     Glucose, UA Negative Negative   Bilirubin, UA Negative    Ketones, UA Negative    Spec Grav, UA <=1.005 (A) 1.010 - 1.025   Blood, UA Trace    pH, UA 8.5 (A) 5.0 - 8.0   Protein, UA Negative Negative   Urobilinogen, UA 0.2 0.2 or 1.0 E.U./dL   Nitrite, UA Negative    Leukocytes, UA Small (1+) (A) Negative   Appearance     Odor        Assessment & Plan:   Problem List Items Addressed This Visit     DDD (degenerative disc disease), lumbar (Chronic)   Lymphedema of both lower extremities   Other Visit Diagnoses     Acute cystitis with hematuria    -  Primary   Relevant Medications   sulfamethoxazole-trimethoprim (BACTRIM DS) 800-160 MG tablet   Antibiotic-induced yeast infection       Relevant Medications   sulfamethoxazole-trimethoprim (BACTRIM DS) 800-160 MG tablet   fluconazole (DIFLUCAN) 150 MG tablet       Chronic Pain  Management Lumbar DDD She has not heard back on the referral from January, she will call to follow  up with [facility].  Continue Tramadol AS NEEDED, can refill as need  UTI was treated with Macrobid previously, it turns out it is resistant to this antibiotic. Now you are off of this one.  I have changed your antibiotic to Bactrim (sulfa-DS) this is a twice a day antibiotic for 1 week. Please let me know if this does not resolve your urinary symptoms, we may need to review it again or refer to Urologist next.   Chronic Lymphedema Stable, now improved from recent flare. Reviewed course and treatment options.   Meds ordered this encounter  Medications   sulfamethoxazole-trimethoprim (BACTRIM DS) 800-160 MG tablet    Sig: Take 1 tablet by mouth 2 (two) times daily for 7 days.    Dispense:  14 tablet    Refill:  0   fluconazole (DIFLUCAN) 150 MG tablet    Sig: Take one tablet by mouth on Day 1. Repeat dose 2nd tablet on Day 3.    Dispense:  2 tablet    Refill:  0      Follow up plan: Return if symptoms worsen or fail to improve.  Nobie Putnam, [facility] 11/07/2022, 9:59 AM

## 2022-11-07 NOTE — Patient Instructions (Addendum)
Thank you for coming to the office today.  I agree, good idea to call [facility] Pain Management back to review the referral as discussed.  Let me know if you need a re order Tramadol for pain until you can see Pain Management.  UTI was treated with Macrobid previously, it turns out it is resistant to this antibiotic. Now you are off of this one.  I have changed your antibiotic to Bactrim (sulfa-DS) this is a twice a day antibiotic for 1 week. Please let me know if this does not resolve your urinary symptoms, we may need to review it again or refer to Urologist next.  Hopefully this does not return or come back.   Please schedule a Follow-up Appointment to: Return if symptoms worsen or fail to improve.  If you have any other questions or concerns, please feel free to call the office or send a message through [place]. You may also schedule an earlier appointment if necessary.  Additionally, you may be receiving a survey about your experience at our office within a few days to 1 week by e-mail or mail. We value your feedback.  Nobie Putnam, DO [place]

## 2022-11-15 ENCOUNTER — Ambulatory Visit: Payer: Medicare Other | Admitting: Family Medicine

## 2022-11-23 ENCOUNTER — Ambulatory Visit: Payer: Self-pay

## 2022-11-23 DIAGNOSIS — M7542 Impingement syndrome of left shoulder: Secondary | ICD-10-CM

## 2022-11-23 DIAGNOSIS — G8929 Other chronic pain: Secondary | ICD-10-CM

## 2022-11-23 DIAGNOSIS — M7552 Bursitis of left shoulder: Secondary | ICD-10-CM

## 2022-11-23 NOTE — Telephone Encounter (Signed)
Called patient. She has acute left shoulder sharp pain similar to prior rotator cuff symptoms. We did not fully discuss this at last visit, she focused on her back pain and knee pain. She sees Pain Management locally still but waiting apt with [facility], they are trying to schedule but backed up.  She has failed most medication options or has allergy to others.  She may try double Tramadol short term, she has 22 pills left, if we need to order more that is okay.  She requested to see Ortho. Prior R shoulder rotator cuff surgery by Dr Earnestine Leys at [facility], referral sent.  She could do walk in hours there if needed for severe pain.  Nobie Putnam, [facility] 11/23/2022, 12:39 PM

## 2022-11-23 NOTE — Telephone Encounter (Signed)
  Chief Complaint: shoulder pain  Symptoms: L shoulder pain 9/10, constant aching throbbing pain, numbness in L arm  Frequency: several days  Pertinent Negatives: Patient denies no swelling  Disposition: '[]'$ ED /'[]'$ Urgent Care (no appt availability in office) / '[]'$ Appointment(In office/virtual)/ '[]'$  [place] Virtual Care/ '[]'$ Home Care/ '[]'$ Refused Recommended Disposition /'[]'$ [place] [facility]/ '[x]'$  Follow-up with PCP Additional Notes: pt states she has reached out to [facility] pain clinic but they haven't set her up appt yet. Pt hasn't slept last night due to pain and asking if she can take tramadol now or 2 at time. Advised pt that I can send message to Dr. Raliegh Ip for recommendations on increasing dose. Pt asking if can be soon because if not she is concerning going to ED d/t hurting that bad.   Called Rachell, FC and spoke with her. Let her know I was going to send triage back to send to Dr. Raliegh Ip as quickly as possible for advice. She states once she gets it she will route it since today is Dr. Raliegh Ip half day.   Summary: shoulder pain   Pt is calling to see if she can take her traMADol (ULTRAM) 50 MG tablet this morning, per pt she has been up all night and she is in pain with her shoulder. Pt is wanting to know if it is okay if she can take two pills?  Please call pt back.         Reason for Disposition  Numbness (i.e., loss of sensation) in hand or fingers  Answer Assessment - Initial Assessment Questions 1. ONSET: "When did the pain start?"     Ongoing for a while, but been worse for several days  2. LOCATION: "Where is the pain located?"     L shoulder  3. PAIN: "How bad is the pain?" (Scale 1-10; or mild, moderate, severe)   - MILD (1-3): doesn't interfere with normal activities   - MODERATE (4-7): interferes with normal activities (e.g., work or school) or awakens from sleep   - SEVERE (8-10): excruciating pain, unable to do any normal activities, unable to move arm at all due to pain     9/10,  constant  6. OTHER SYMPTOMS: "Do you have any other symptoms?" (e.g., neck pain, swelling, rash, fever, numbness, weakness)     Numbness in L arm down to fingers  Protocols used: Shoulder Pain-A-AH

## 2022-11-23 NOTE — Addendum Note (Signed)
Addended by: Olin Hauser on: 11/23/2022 12:40 PM   Modules accepted: Orders

## 2022-11-27 DIAGNOSIS — M19012 Primary osteoarthritis, left shoulder: Secondary | ICD-10-CM | POA: Diagnosis not present

## 2022-11-30 ENCOUNTER — Other Ambulatory Visit: Payer: Self-pay | Admitting: Family Medicine

## 2022-11-30 DIAGNOSIS — M25462 Effusion, left knee: Secondary | ICD-10-CM

## 2022-11-30 DIAGNOSIS — G8929 Other chronic pain: Secondary | ICD-10-CM

## 2022-11-30 MED ORDER — TRAMADOL HCL 50 MG PO TABS: 50.0000 mg | ORAL_TABLET | Freq: Every evening | ORAL | 0 refills | Status: DC | PRN

## 2022-11-30 NOTE — Telephone Encounter (Signed)
Requested medication (s) are due for refill today: Yes  Requested medication (s) are on the active medication list: Yes  Last refill:  10/04/22  Future visit scheduled: No  Notes to clinic:  Unable to refill per protocol, cannot delegate.      Requested Prescriptions  Pending Prescriptions Disp Refills   traMADol (ULTRAM) 50 MG tablet 30 tablet 2    Sig: Take 1 tablet (50 mg total) by mouth at bedtime as needed for moderate pain.     Not Delegated - Analgesics:  Opioid Agonists Failed - 11/30/2022 10:23 AM      Failed - This refill cannot be delegated      Failed - Urine Drug Screen completed in last 360 days      Passed - Valid encounter within last 3 months    Recent Outpatient Visits           3 weeks ago Acute cystitis with hematuria   [place], DO   4 weeks ago Burning with urination   [facility] [place], Coralie Keens, NP   1 month ago DDD (degenerative disc disease), lumbar   [place], DO   3 months ago Annual physical exam   [facility] Olin Hauser, DO   5 months ago Trigger finger, right ring finger   [facility] [place], Coralie Keens, [place]

## 2022-11-30 NOTE — Telephone Encounter (Signed)
Medication Refill - Medication: traMADol (ULTRAM) 50 MG tablet   Almost completely out of current supply   Has the patient contacted their pharmacy? Yes.   (Agent: If no, request that the patient contact the pharmacy for the refill. If patient does not wish to contact the pharmacy document the reason why and proceed with request.) (Agent: If yes, when and what did the pharmacy advise?)  Preferred Pharmacy (with phone number or street name):  [facility] [place], [address]  [place] [address]  Phone: [phone] Fax: [phone]   Has the patient been seen for an appointment in the last year OR does the patient have an upcoming appointment? Yes.    Agent: Please be advised that RX refills may take up to 3 business days. We ask that you follow-up with your pharmacy.

## 2022-12-11 DIAGNOSIS — M25561 Pain in right knee: Secondary | ICD-10-CM | POA: Diagnosis not present

## 2022-12-11 DIAGNOSIS — M25571 Pain in right ankle and joints of right foot: Secondary | ICD-10-CM | POA: Diagnosis not present

## 2022-12-11 DIAGNOSIS — Z0189 Encounter for other specified special examinations: Secondary | ICD-10-CM | POA: Diagnosis not present

## 2022-12-11 DIAGNOSIS — M19012 Primary osteoarthritis, left shoulder: Secondary | ICD-10-CM | POA: Diagnosis not present

## 2022-12-19 ENCOUNTER — Other Ambulatory Visit: Payer: Self-pay | Admitting: Family Medicine

## 2022-12-19 ENCOUNTER — Telehealth: Payer: Self-pay | Admitting: Family Medicine

## 2022-12-19 DIAGNOSIS — G8929 Other chronic pain: Secondary | ICD-10-CM

## 2022-12-19 DIAGNOSIS — M25462 Effusion, left knee: Secondary | ICD-10-CM

## 2022-12-19 DIAGNOSIS — M5136 Other intervertebral disc degeneration, lumbar region: Secondary | ICD-10-CM

## 2022-12-19 DIAGNOSIS — M7542 Impingement syndrome of left shoulder: Secondary | ICD-10-CM

## 2022-12-19 DIAGNOSIS — M545 Low back pain, unspecified: Secondary | ICD-10-CM

## 2022-12-19 MED ORDER — TRAMADOL HCL 50 MG PO TABS: 50.0000 mg | ORAL_TABLET | Freq: Every evening | ORAL | 0 refills | Status: DC | PRN

## 2022-12-19 NOTE — Telephone Encounter (Signed)
Medication Refill - Medication: traMADol (ULTRAM) 50 MG tablet   Has the patient contacted their pharmacy? No. (Agent: If no, request that the patient contact the pharmacy for the refill. If patient does not wish to contact the pharmacy document the reason why and proceed with request.) (Agent: If yes, when and what did the pharmacy advise?)  Preferred Pharmacy (with phone number or street name):  [facility] [place], [address] Phone: [phone]  Fax: [phone]     Has the patient been seen for an appointment in the last year OR does the patient have an upcoming appointment? Yes.    Agent: Please be advised that RX refills may take up to 3 business days. We ask that you follow-up with your pharmacy.

## 2022-12-19 NOTE — Telephone Encounter (Signed)
Please notify patient that we received a denial from [facility] on her referral that was submitted in January 2024.  The letter says that she does not meet the criteria for their pain management services.  It sounds like they are not able to treat her. I am not sure the specifics, they would have to share that information with her.  But it sounds like if she still wants to pursue pain management specialist, we would need to locate another location for her.  We are running out of options, since have already tried [facility], [facility].  Remaining options are:  [facility] [address], [address], [place] [address] Phone: [phone] Fax: [phone]  [facility] Ph: [phone] Fax: [phone]    [facility] [address], [place] [address] Ph: [phone] (Multiple locations in [place])  Nobie Putnam, [facility] 12/19/2022, 5:05 PM

## 2022-12-19 NOTE — Telephone Encounter (Signed)
Pt is calling back to check on her refill request. I did go over the time frame with pt regarding refill request. Pt still requested for another message to be sent to her PCP.

## 2022-12-19 NOTE — Telephone Encounter (Signed)
Requested medications are due for refill today.  unsure  Requested medications are on the active medications list.  yes  Last refill. 11/30/2022 #30 0 rf  Future visit scheduled.   no  Notes to clinic.  Refill not delegated.    Requested Prescriptions  Pending Prescriptions Disp Refills   traMADol (ULTRAM) 50 MG tablet 30 tablet 0    Sig: Take 1 tablet (50 mg total) by mouth at bedtime as needed for moderate pain.     Not Delegated - Analgesics:  Opioid Agonists Failed - 12/19/2022 10:27 AM      Failed - This refill cannot be delegated      Failed - Urine Drug Screen completed in last 360 days      Passed - Valid encounter within last 3 months    Recent Outpatient Visits           1 month ago Acute cystitis with hematuria   [place], DO   1 month ago Burning with urination   [facility] [place], Coralie Keens, NP   2 months ago DDD (degenerative disc disease), lumbar   [place], DO   4 months ago Annual physical exam   [place], DO   5 months ago Trigger finger, right ring finger   [facility], Coralie Keens, [place]

## 2022-12-20 ENCOUNTER — Telehealth: Payer: Self-pay | Admitting: Family Medicine

## 2022-12-20 DIAGNOSIS — M25462 Effusion, left knee: Secondary | ICD-10-CM

## 2022-12-20 DIAGNOSIS — G8929 Other chronic pain: Secondary | ICD-10-CM

## 2022-12-20 MED ORDER — TRAMADOL HCL 50 MG PO TABS: 50.0000 mg | ORAL_TABLET | Freq: Two times a day (BID) | ORAL | 0 refills | Status: DC | PRN

## 2022-12-20 NOTE — Telephone Encounter (Signed)
Called patient and she reported she is having a shoulder replacement on 02/15/23.  She would like to try to a new referral to:  [facility] [address], [address], [place] [address] Phone: [phone] Fax: [phone]

## 2022-12-20 NOTE — Addendum Note (Signed)
Addended by: Olin Hauser on: 12/20/2022 03:06 PM   Modules accepted: Orders

## 2022-12-20 NOTE — Telephone Encounter (Signed)
The patient called in stating she is having a problem getting her refill of her pain medicine,  traMADol (ULTRAM) 50 MG tablet because she states the pharmacy says it is too early. She has been taking 2 pills a day as opposed to 1 as she said her provider said that would be ok and to call if there are any problems. The provider authorized refill yesterday but the pharmacy will not fill stating it is too early. Please assist patient further as she uses   [facility] [place], [place] AT [address] Phone: [phone]  Fax: [phone]

## 2022-12-20 NOTE — Telephone Encounter (Signed)
New referral to [facility].  [facility] declined to accept her as a new patient.  Nobie Putnam, [facility] 12/20/2022, 3:06 PM

## 2022-12-20 NOTE — Telephone Encounter (Signed)
LMTCB 12/20/2022.  PEC please advise pt when she calls back.   Thanks,   -Mickel Baas

## 2022-12-20 NOTE — Telephone Encounter (Signed)
I advised her that she may take a 2nd pill if needed, but if she is taking it twice a day every day it certainly has run out too fast.  Last ordered 11/30/22.  So it has not been 28 days yet.  I will change the instructions on the order, but the pharmacy may still make her wait to pick it up. That is up to the pharmacy, not me unfortunately.  New order sent to pharmacy.  I will work on her pain management referral later today.  Please remind her that we will be transferring her pain medication to their office once she gets established.  Nobie Putnam, DO [facility] 12/20/2022, 1:25 PM

## 2022-12-26 DIAGNOSIS — G8929 Other chronic pain: Secondary | ICD-10-CM

## 2022-12-26 DIAGNOSIS — M25462 Effusion, left knee: Secondary | ICD-10-CM

## 2022-12-26 NOTE — Telephone Encounter (Signed)
Sent patient a MyChart message due to not being able to contact through phone calls.

## 2023-01-12 ENCOUNTER — Ambulatory Visit (INDEPENDENT_AMBULATORY_CARE_PROVIDER_SITE_OTHER): Payer: Medicare HMO | Admitting: Vascular Surgery

## 2023-01-12 VITALS — BP 128/77 | HR 91 | Resp 17 | Ht 67.0 in | Wt 267.0 lb

## 2023-01-12 DIAGNOSIS — I890 Lymphedema, not elsewhere classified: Secondary | ICD-10-CM

## 2023-01-12 DIAGNOSIS — M7989 Other specified soft tissue disorders: Secondary | ICD-10-CM | POA: Diagnosis not present

## 2023-01-12 DIAGNOSIS — R7303 Prediabetes: Secondary | ICD-10-CM

## 2023-01-12 DIAGNOSIS — I10 Essential (primary) hypertension: Secondary | ICD-10-CM | POA: Diagnosis not present

## 2023-01-12 NOTE — Progress Notes (Signed)
MRN : [id number]  Megan Perez is a 77 y.o. (1946-09-13) female who presents with chief complaint of  Chief Complaint  Patient presents with   Follow-up  .  History of Present Illness: Patient returns today in follow up of her leg swelling.  She had 2 falls including a sprained ankle here recently and her swelling has gotten much worse.  It is significantly worse than the last time I saw her a year ago.  She denies any new ulceration or wounds.  She has been wrapped in Unna boots previously for severe swelling and asks about that again today.  Current Outpatient Medications  Medication Sig Dispense Refill   acetaminophen (TYLENOL) 500 MG tablet Take 1,300 mg by mouth every 6 (six) hours as needed.      azelastine (OPTIVAR) 0.05 % ophthalmic solution Place 2 drops into both eyes daily as needed.     diclofenac sodium (VOLTAREN) 1 % GEL APPLY 2 GRAMS TOPICALLY FOUR TIMES DAILY AS NEEDED FOR MODERATE PAIN OF BACK AND KNEES 100 g 1   fluticasone (FLONASE) 50 MCG/ACT nasal spray Place 2 sprays into both nostrils daily. Use for 4-6 weeks then stop and use seasonally or as needed. 16 g 3   lisinopril (ZESTRIL) 20 MG tablet TAKE 1 TABLET(20 MG) BY MOUTH DAILY 90 tablet 1   pantoprazole (PROTONIX) 20 MG tablet TAKE 1 TABLET(20 MG) BY MOUTH DAILY BEFORE BREAKFAST 90 tablet 1   traMADol (ULTRAM) 50 MG tablet Take 1 tablet (50 mg total) by mouth 2 (two) times daily as needed for moderate pain. 60 tablet 0   fluconazole (DIFLUCAN) 150 MG tablet Take one tablet by mouth on Day 1. Repeat dose 2nd tablet on Day 3. (Patient not taking: Reported on 01/12/2023) 2 tablet 0   No current facility-administered medications for this visit.    Past Medical History:  Diagnosis Date   Anxiety    Asthma    reactive airway and shortness of breath w/ exertion in hot temperatures   Bronchitis    GERD (gastroesophageal reflux disease)    Hypertension    Morbid obesity with BMI of 40.0-44.9, adult (HCC) 04/08/2012    OA (osteoarthritis)    knee   Osteoporosis    Prediabetes    Sinus infection    10/2018   Venous (peripheral) insufficiency 08/19/2010   Qualifier: Diagnosis of  By: [name], [place]      Past Surgical History:  Procedure Laterality Date   ABDOMINAL HYSTERECTOMY     CARPAL TUNNEL RELEASE Right    CHOLECYSTECTOMY     COLONOSCOPY WITH PROPOFOL N/A 06/19/2017   Procedure: COLONOSCOPY WITH PROPOFOL;  Surgeon: [name], MD;  Location: [facility];  Service: Endoscopy;  Laterality: N/A;   HEEL SPUR SURGERY     REPLACEMENT TOTAL KNEE     left   REPLACEMENT TOTAL KNEE Right    SHOULDER ARTHROSCOPY WITH OPEN ROTATOR CUFF REPAIR Right 09/15/2015   Procedure: SHOULDER ARTHROSCOPY WITH OPEN ROTATOR CUFF REPAIR;  Surgeon: [name], MD;  Location: [facility] ORS;  Service: Orthopedics;  Laterality: Right;   SHOULDER SURGERY Left      Social History   Tobacco Use   Smoking status: Never   Smokeless tobacco: Never  Vaping Use   Vaping Use: Never used  Substance Use Topics   Alcohol use: No   Drug use: No       Family History  Adopted: Yes  Problem Relation Age of Onset   Heart  disease Father    Subarachnoid hemorrhage Mother    Hypertension Mother    Subarachnoid hemorrhage Sister    Hypertension Sister    Hypertension Sister    Hypertension Sister    Healthy Sister    Breast cancer Neg Hx      Allergies  Allergen Reactions   Baclofen     Makes her feel Drunk   Meclizine    Other     Other reaction(s): Other (See Comments) Patient notes there is a pain medication that causes hot flushing sensation in her mouth but cannot recall its name.   Penicillins    Prednisone Nausea And Vomiting   Prevacid [Lansoprazole]     Causes dizziness   Gabapentin Hives and Itching   Oxycodone Rash   Pregabalin Er Other (See Comments)    Cognitive impairment   Tizanidine Hcl Other (See Comments)    Cognitive impairment    REVIEW OF SYSTEMS (Negative unless checked)    Constitutional: [] Weight loss  [] Fever  [] Chills Cardiac: [] Chest pain   [] Chest pressure   [] Palpitations   [] Shortness of breath when laying flat   [] Shortness of breath at rest   [] Shortness of breath with exertion. Vascular:  [x] Pain in legs with walking   [x] Pain in legs at rest   [] Pain in legs when laying flat   [] Claudication   [] Pain in feet when walking  [] Pain in feet at rest  [] Pain in feet when laying flat   [] History of DVT   [] Phlebitis   [x] Swelling in legs   [] Varicose veins   [] Non-healing ulcers Pulmonary:   [] Uses home oxygen   [] Productive cough   [] Hemoptysis   [] Wheeze  [] COPD   [] Asthma Neurologic:  [] Dizziness  [] Blackouts   [] Seizures   [] History of stroke   [] History of TIA  [] Aphasia   [] Temporary blindness   [] Dysphagia   [] Weakness or numbness in arms   [x] Weakness or numbness in legs Musculoskeletal:  [x] Arthritis   [] Joint swelling   [x] Joint pain   [] Low back pain Hematologic:  [] Easy bruising  [] Easy bleeding   [] Hypercoagulable state   [] Anemic  [] Hepatitis Gastrointestinal:  [] Blood in stool   [] Vomiting blood  [] Gastroesophageal reflux/heartburn   [] Abdominal pain Genitourinary:  [] Chronic kidney disease   [] Difficult urination  [] Frequent urination  [] Burning with urination   [] Hematuria Skin:  [] Rashes   [] Ulcers   [] Wounds Psychological:  [] History of anxiety   []  History of major depression.  Physical Examination  BP 128/77 (BP Location: Left Arm)   Pulse 91   Resp 17   Ht 5\' 7"  (1.702 m)   Wt 267 lb (121.1 kg)   BMI 41.82 kg/m  Gen:  WD/WN, NAD Head: [place]/AT, No temporalis wasting. Ear/Nose/Throat: Hearing grossly intact, nares w/o erythema or drainage Eyes: Conjunctiva clear. Sclera non-icteric Neck: Supple.  Trachea midline Pulmonary:  Good air movement, no use of accessory muscles.  Cardiac: RRR, no JVD Vascular:  Vessel Right Left  Radial Palpable Palpable               Musculoskeletal: M/S 5/5 throughout.  No deformity or atrophy. 2-3+  BLE edema, slightly worse on the right than the left. Neurologic: Sensation grossly intact in extremities.  Symmetrical.  Speech is fluent.  Psychiatric: Judgment intact, Mood & affect appropriate for pt's clinical situation. Dermatologic: No rashes or ulcers noted.  No cellulitis or open wounds.      Labs Recent Results (from the past 2160 hour(s))  Urine Culture  Status: Abnormal   Collection Time: 11/02/22  2:43 PM   Specimen: Urine  Result Value Ref Range   MICRO NUMBER: [id number]    SPECIMEN QUALITY: Adequate    Sample Source URINE, CLEAN CATCH    STATUS: FINAL    ISOLATE 1: Proteus mirabilis (A)     Comment: Greater than 100,000 CFU/mL of Proteus mirabilis This organism may show imipenem resistance by mechanisms other than a carbapenemase.      Susceptibility   Proteus mirabilis - URINE CULTURE, REFLEX    AMOX/CLAVULANIC <=2 Sensitive     AMPICILLIN <=2 Sensitive     AMPICILLIN/SULBACTAM <=2 Sensitive     CEFAZOLIN* <=4 Not Reportable      * For infections other than uncomplicated UTI caused by E. coli, K. pneumoniae or P. mirabilis: Cefazolin is resistant if MIC > or = 8 mcg/mL. (Distinguishing susceptible versus intermediate for isolates with MIC < or = 4 mcg/mL requires additional testing.) For uncomplicated UTI caused by E. coli, K. pneumoniae or P. mirabilis: Cefazolin is susceptible if MIC <32 mcg/mL and predicts susceptible to the oral agents cefaclor, cefdinir, cefpodoxime, cefprozil, cefuroxime, cephalexin and loracarbef.     CEFTAZIDIME <=1 Sensitive     CEFEPIME <=1 Sensitive     CEFTRIAXONE <=1 Sensitive     CIPROFLOXACIN <=0.25 Sensitive     LEVOFLOXACIN <=0.12 Sensitive     GENTAMICIN <=1 Sensitive     IMIPENEM 2 Intermediate     NITROFURANTOIN 128 Resistant     PIP/TAZO <=4 Sensitive     TOBRAMYCIN <=1 Sensitive     TRIMETH/SULFA* <=20 Sensitive      * For infections other than uncomplicated UTI caused by E. coli, K. pneumoniae or P.  mirabilis: Cefazolin is resistant if MIC > or = 8 mcg/mL. (Distinguishing susceptible versus intermediate for isolates with MIC < or = 4 mcg/mL requires additional testing.) For uncomplicated UTI caused by E. coli, K. pneumoniae or P. mirabilis: Cefazolin is susceptible if MIC <32 mcg/mL and predicts susceptible to the oral agents cefaclor, cefdinir, cefpodoxime, cefprozil, cefuroxime, cephalexin and loracarbef. Legend: S = Susceptible  I = Intermediate R = Resistant  NS = Not susceptible * = Not tested  NR = Not reported **NN = See antimicrobic comments   POCT urinalysis dipstick     Status: Abnormal   Collection Time: 11/02/22  3:20 PM  Result Value Ref Range   Color, UA     Clarity, UA     Glucose, UA Negative Negative   Bilirubin, UA Negative    Ketones, UA Negative    Spec Grav, UA <=1.005 (A) 1.010 - 1.025   Blood, UA Trace    pH, UA 8.5 (A) 5.0 - 8.0   Protein, UA Negative Negative   Urobilinogen, UA 0.2 0.2 or 1.0 E.U./dL   Nitrite, UA Negative    Leukocytes, UA Small (1+) (A) Negative   Appearance     Odor      Radiology No results found.  Assessment/Plan  Swelling of limb Swelling is much worse.  Has had an exacerbation of her lymphedema particular with her immobility and recent ankle sprain.  3 layer Unna boots were placed today and will be changed weekly.  Reassess in 3 to 4 weeks.  Prediabetes blood glucose control important in reducing the progression of atherosclerotic disease. Also, involved in wound healing. On appropriate medications.   Lymphedema of both lower extremities Swelling is much worse.  Has had an exacerbation of her lymphedema particular with  her immobility and recent ankle sprain.  3 layer Unna boots were placed today and will be changed weekly.  Continue to use her lymphedema pump and likely can go to using this twice daily.  Reassess in 3 to 4 weeks.  Essential hypertension blood pressure control important in reducing the  progression of atherosclerotic disease. On appropriate oral medications.    Festus Barren, MD  01/12/2023 11:18 AM    This note was created with Dragon medical transcription system.  Any errors from dictation are purely unintentional

## 2023-01-12 NOTE — Assessment & Plan Note (Signed)
blood glucose control important in reducing the progression of atherosclerotic disease. Also, involved in wound healing. On appropriate medications.  

## 2023-01-12 NOTE — Assessment & Plan Note (Signed)
blood pressure control important in reducing the progression of atherosclerotic disease. On appropriate oral medications.  

## 2023-01-12 NOTE — Assessment & Plan Note (Signed)
Swelling is much worse.  Has had an exacerbation of her lymphedema particular with her immobility and recent ankle sprain.  3 layer Unna boots were placed today and will be changed weekly.  Continue to use her lymphedema pump and likely can go to using this twice daily.  Reassess in 3 to 4 weeks.

## 2023-01-12 NOTE — Assessment & Plan Note (Signed)
Swelling is much worse.  Has had an exacerbation of her lymphedema particular with her immobility and recent ankle sprain.  3 layer Unna boots were placed today and will be changed weekly.  Reassess in 3 to 4 weeks.

## 2023-01-15 MED ORDER — TRAMADOL HCL 50 MG PO TABS
100.0000 mg | ORAL_TABLET | Freq: Two times a day (BID) | ORAL | 0 refills | Status: DC | PRN
Start: 2023-01-15 — End: 2023-04-12

## 2023-01-15 NOTE — Telephone Encounter (Signed)
Patient says she is taking 2 tramadol in the morning and 2 at night.

## 2023-01-15 NOTE — Addendum Note (Signed)
Addended by: Smitty Cords on: 01/15/2023 02:58 PM   Modules accepted: Orders

## 2023-01-17 ENCOUNTER — Telehealth (INDEPENDENT_AMBULATORY_CARE_PROVIDER_SITE_OTHER): Payer: Self-pay

## 2023-01-17 NOTE — Telephone Encounter (Signed)
Megan Perez called to cancel all future appts pertaining to the unna boots. She stated they make her legs itch and burn, so she removed them and no longer wants them on.

## 2023-01-19 ENCOUNTER — Encounter (INDEPENDENT_AMBULATORY_CARE_PROVIDER_SITE_OTHER): Payer: Medicare HMO

## 2023-01-26 ENCOUNTER — Encounter (INDEPENDENT_AMBULATORY_CARE_PROVIDER_SITE_OTHER): Payer: Medicare HMO

## 2023-02-02 ENCOUNTER — Encounter (INDEPENDENT_AMBULATORY_CARE_PROVIDER_SITE_OTHER): Payer: Medicare HMO

## 2023-02-09 ENCOUNTER — Encounter (INDEPENDENT_AMBULATORY_CARE_PROVIDER_SITE_OTHER): Payer: Medicare HMO

## 2023-02-09 DIAGNOSIS — Z01812 Encounter for preprocedural laboratory examination: Secondary | ICD-10-CM | POA: Diagnosis not present

## 2023-02-09 DIAGNOSIS — M19012 Primary osteoarthritis, left shoulder: Secondary | ICD-10-CM | POA: Diagnosis not present

## 2023-02-15 DIAGNOSIS — M19012 Primary osteoarthritis, left shoulder: Secondary | ICD-10-CM | POA: Diagnosis not present

## 2023-02-15 DIAGNOSIS — I10 Essential (primary) hypertension: Secondary | ICD-10-CM | POA: Diagnosis not present

## 2023-02-15 DIAGNOSIS — J302 Other seasonal allergic rhinitis: Secondary | ICD-10-CM | POA: Diagnosis not present

## 2023-02-15 DIAGNOSIS — Z96653 Presence of artificial knee joint, bilateral: Secondary | ICD-10-CM | POA: Diagnosis not present

## 2023-02-15 DIAGNOSIS — F419 Anxiety disorder, unspecified: Secondary | ICD-10-CM | POA: Diagnosis not present

## 2023-02-15 DIAGNOSIS — G8918 Other acute postprocedural pain: Secondary | ICD-10-CM | POA: Diagnosis not present

## 2023-02-15 DIAGNOSIS — Z888 Allergy status to other drugs, medicaments and biological substances status: Secondary | ICD-10-CM | POA: Diagnosis not present

## 2023-02-15 DIAGNOSIS — K219 Gastro-esophageal reflux disease without esophagitis: Secondary | ICD-10-CM | POA: Diagnosis not present

## 2023-02-15 DIAGNOSIS — M12812 Other specific arthropathies, not elsewhere classified, left shoulder: Secondary | ICD-10-CM | POA: Diagnosis not present

## 2023-02-15 DIAGNOSIS — Z79899 Other long term (current) drug therapy: Secondary | ICD-10-CM | POA: Diagnosis not present

## 2023-02-15 DIAGNOSIS — J45909 Unspecified asthma, uncomplicated: Secondary | ICD-10-CM | POA: Diagnosis not present

## 2023-02-15 DIAGNOSIS — I890 Lymphedema, not elsewhere classified: Secondary | ICD-10-CM | POA: Diagnosis not present

## 2023-02-16 ENCOUNTER — Ambulatory Visit (INDEPENDENT_AMBULATORY_CARE_PROVIDER_SITE_OTHER): Payer: Medicare HMO | Admitting: Nurse Practitioner

## 2023-02-19 ENCOUNTER — Other Ambulatory Visit: Payer: Self-pay | Admitting: Family Medicine

## 2023-02-19 DIAGNOSIS — K219 Gastro-esophageal reflux disease without esophagitis: Secondary | ICD-10-CM

## 2023-02-20 NOTE — Telephone Encounter (Signed)
Unable to refill per protocol, Rx request is too soon. Last refill 09/25/22 for 90 and 1 refill.  Requested Prescriptions  Pending Prescriptions Disp Refills   pantoprazole (PROTONIX) 20 MG tablet [Pharmacy Med Name: PANTOPRAZOLE SODIUM 20 MG Tablet Delayed Release] 90 tablet 3    Sig: TAKE 1 TABLET EVERY DAY BEFORE BREAKFAST     Gastroenterology: Proton Pump Inhibitors Passed - 02/19/2023  3:10 AM      Passed - Valid encounter within last 12 months    Recent Outpatient Visits           3 months ago Acute cystitis with hematuria   [place] [facility] Smitty Cords, DO   3 months ago Burning with urination   [place] [facility] [place], Salvadore [place], NP   4 months ago DDD (degenerative disc disease), lumbar   [place] [facility] Smitty Cords, DO   6 months ago Annual physical exam   [place] [facility] Smitty Cords, DO   7 months ago Trigger finger, right ring finger   [place] [facility] [place], Salvadore [place], [place]

## 2023-02-22 DIAGNOSIS — Z96612 Presence of left artificial shoulder joint: Secondary | ICD-10-CM | POA: Diagnosis not present

## 2023-03-13 DIAGNOSIS — Z96612 Presence of left artificial shoulder joint: Secondary | ICD-10-CM | POA: Diagnosis not present

## 2023-03-20 DIAGNOSIS — Z96612 Presence of left artificial shoulder joint: Secondary | ICD-10-CM | POA: Diagnosis not present

## 2023-03-26 ENCOUNTER — Other Ambulatory Visit: Payer: Self-pay | Admitting: Family Medicine

## 2023-03-26 DIAGNOSIS — I10 Essential (primary) hypertension: Secondary | ICD-10-CM

## 2023-03-26 NOTE — Telephone Encounter (Signed)
Requested Prescriptions  Pending Prescriptions Disp Refills   lisinopril (ZESTRIL) 20 MG tablet [Pharmacy Med Name: LISINOPRIL 20 MG Tablet] 90 tablet 0    Sig: TAKE 1 TABLET EVERY DAY     Cardiovascular:  ACE Inhibitors Failed - 03/26/2023 11:14 AM      Failed - Cr in normal range and within 180 days    Creat  Date Value Ref Range Status  07/31/2022 0.90 0.60 - 1.00 mg/dL Final         Failed - K in normal range and within 180 days    Potassium  Date Value Ref Range Status  07/31/2022 4.3 3.5 - 5.3 mmol/L Final         Passed - Patient is not pregnant      Passed - Last BP in normal range    BP Readings from Last 1 Encounters:  01/12/23 128/77         Passed - Valid encounter within last 6 months    Recent Outpatient Visits           4 months ago Acute cystitis with hematuria   [place] [facility] Smitty [place], DO   4 months ago Burning with urination   [place] [facility] [place], Salvad[place], NP   5 months ago DDD (degenerative disc disease), lumbar   [place] [facility] Smitty Cords, DO   7 months ago Annual physical exam   [place] [facility] Smitty Cords, DO   9 months ago Trigger finger, right ring finger   [place] [facility] [place], Salvadore [place], [place]

## 2023-03-28 DIAGNOSIS — Z96612 Presence of left artificial shoulder joint: Secondary | ICD-10-CM | POA: Diagnosis not present

## 2023-04-10 ENCOUNTER — Other Ambulatory Visit: Payer: Self-pay | Admitting: Family Medicine

## 2023-04-10 DIAGNOSIS — Z1231 Encounter for screening mammogram for malignant neoplasm of breast: Secondary | ICD-10-CM

## 2023-04-10 DIAGNOSIS — M65331 Trigger finger, right middle finger: Secondary | ICD-10-CM | POA: Diagnosis not present

## 2023-04-12 ENCOUNTER — Other Ambulatory Visit: Payer: Self-pay | Admitting: Family Medicine

## 2023-04-12 DIAGNOSIS — G5621 Lesion of ulnar nerve, right upper limb: Secondary | ICD-10-CM | POA: Diagnosis not present

## 2023-04-12 DIAGNOSIS — M25462 Effusion, left knee: Secondary | ICD-10-CM

## 2023-04-12 DIAGNOSIS — G8929 Other chronic pain: Secondary | ICD-10-CM

## 2023-04-12 DIAGNOSIS — G5601 Carpal tunnel syndrome, right upper limb: Secondary | ICD-10-CM | POA: Diagnosis not present

## 2023-04-12 MED ORDER — TRAMADOL HCL 50 MG PO TABS
100.0000 mg | ORAL_TABLET | Freq: Two times a day (BID) | ORAL | 0 refills | Status: AC | PRN
Start: 2023-04-12 — End: ?

## 2023-04-19 ENCOUNTER — Other Ambulatory Visit: Payer: Self-pay | Admitting: Family Medicine

## 2023-04-19 DIAGNOSIS — K219 Gastro-esophageal reflux disease without esophagitis: Secondary | ICD-10-CM

## 2023-04-19 DIAGNOSIS — G8929 Other chronic pain: Secondary | ICD-10-CM

## 2023-04-19 DIAGNOSIS — M25462 Effusion, left knee: Secondary | ICD-10-CM

## 2023-04-20 NOTE — Telephone Encounter (Signed)
Requested medication (s) are due for refill today: routing for review  Requested medication (s) are on the active medication list: yes  Last refill:  04/12/23  Future visit scheduled: no  Notes to clinic:  Unable to refill per protocol, cannot delegate.      Requested Prescriptions  Pending Prescriptions Disp Refills   traMADol (ULTRAM) 50 MG tablet [Pharmacy Med Name: TRAMADOL HYDROCHLORIDE 50 MG Tablet] 30 tablet      Not Delegated - Analgesics:  Opioid Agonists Failed - 04/19/2023 12:52 PM      Failed - This refill cannot be delegated      Failed - Urine Drug Screen completed in last 360 days      Failed - Valid encounter within last 3 months    Recent Outpatient Visits           5 months ago Acute cystitis with hematuria   [place] [facility] Smitty Cords, DO   5 months ago Burning with urination   [place] [facility] [place], Salvadore [place], NP   6 months ago DDD (degenerative disc disease), lumbar   [place] [facility] Smitty Cords, DO   8 months ago Annual physical exam   [place] [facility] Smitty Cords, DO   9 months ago Trigger finger, right ring finger   [place] [facility] [place], [place], NP              Signed Prescriptions Disp Refills   pantoprazole (PROTONIX) 20 MG tablet 90 tablet 0    Sig: TAKE 1 TABLET EVERY DAY BEFORE BREAKFAST     Gastroenterology: Proton Pump Inhibitors Passed - 04/19/2023 12:52 PM      Passed - Valid encounter within last 12 months    Recent Outpatient Visits           5 months ago Acute cystitis with hematuria   [place] [facility] [place], Netta [place], DO   5 months ago Burning with urination   [place] [facility] [place], Salvadore [place], NP   6 months ago DDD (degenerative disc disease), lumbar   [place] [facility] [place], Netta [place], DO   8 months ago Annual physical exam   [place] [facility] Smitty Cords, DO   9 months ago Trigger finger, right ring finger    [facility] [place], Salv[place], [place]

## 2023-04-20 NOTE — Telephone Encounter (Signed)
Requested Prescriptions  Pending Prescriptions Disp Refills   pantoprazole (PROTONIX) 20 MG tablet [Pharmacy Med Name: PANTOPRAZOLE SODIUM 20 MG Tablet Delayed Release] 90 tablet 0    Sig: TAKE 1 TABLET EVERY DAY BEFORE BREAKFAST     Gastroenterology: Proton Pump Inhibitors Passed - 04/19/2023 12:52 PM      Passed - Valid encounter within last 12 months    Recent Outpatient Visits           5 months ago Acute cystitis with hematuria   [place] [facility] [place], Netta Neat, DO   5 months ago Burning with urination   [place] [facility] [place], Salvadore [place], NP   6 months ago DDD (degenerative disc disease), lumbar   [place] [facility] Smitty Cords, DO   8 months ago Annual physical exam   [place] [facility] Smitty Cords, DO   9 months ago Trigger finger, right ring finger   [place] [facility] [place], [place], NP               traMADol (ULTRAM) 50 MG tablet [Pharmacy Med Name: TRAMADOL HYDROCHLORIDE 50 MG Tablet] 30 tablet      Not Delegated - Analgesics:  Opioid Agonists Failed - 04/19/2023 12:52 PM      Failed - This refill cannot be delegated      Failed - Urine Drug Screen completed in last 360 days      Failed - Valid encounter within last 3 months    Recent Outpatient Visits           5 months ago Acute cystitis with hematuria   [place] [facility] [place] Smitty Cords, DO   5 months ago Burning with urination   [place] [facility] [place], Salvadore Oxford, NP   6 months ago DDD (degenerative disc disease), lumbar    [facility] [place], Netta Neat, DO   8 months ago Annual physical exam    [facility] Smitty Cords, DO   9 months ago Trigger finger, right ring finger    [facility] [place], Salvadore Oxford, [place]

## 2023-04-24 DIAGNOSIS — G5601 Carpal tunnel syndrome, right upper limb: Secondary | ICD-10-CM | POA: Diagnosis not present

## 2023-05-11 ENCOUNTER — Ambulatory Visit
Admission: RE | Admit: 2023-05-11 | Discharge: 2023-05-11 | Disposition: A | Discharge status: Home / Self Care | Payer: Medicare HMO | Source: Ambulatory Visit | Attending: Family Medicine | Admitting: Family Medicine

## 2023-05-11 DIAGNOSIS — Z1231 Encounter for screening mammogram for malignant neoplasm of breast: Secondary | ICD-10-CM | POA: Diagnosis not present

## 2023-06-21 ENCOUNTER — Other Ambulatory Visit: Payer: Self-pay | Admitting: Family Medicine

## 2023-06-21 DIAGNOSIS — M25462 Effusion, left knee: Secondary | ICD-10-CM

## 2023-06-21 DIAGNOSIS — G8929 Other chronic pain: Secondary | ICD-10-CM

## 2023-06-28 ENCOUNTER — Other Ambulatory Visit: Payer: Self-pay | Admitting: Family Medicine

## 2023-06-28 DIAGNOSIS — I10 Essential (primary) hypertension: Secondary | ICD-10-CM

## 2023-06-28 NOTE — Telephone Encounter (Signed)
Called patient to schedule appt for medication refills. Appt scheduled for 07/09/23.

## 2023-06-28 NOTE — Telephone Encounter (Signed)
Requested by interface surescripts. Appt scheduled for 07/09/23 for med. Refills.  Requested Prescriptions  Pending Prescriptions Disp Refills   lisinopril (ZESTRIL) 20 MG tablet [Pharmacy Med Name: Lisinopril Oral Tablet 20 MG] 90 tablet 0    Sig: TAKE 1 TABLET EVERY DAY     Cardiovascular:  ACE Inhibitors Failed - 06/28/2023 11:24 AM      Failed - Cr in normal range and within 180 days    Creat  Date Value Ref Range Status  07/31/2022 0.90 0.60 - 1.00 mg/dL Final         Failed - K in normal range and within 180 days    Potassium  Date Value Ref Range Status  07/31/2022 4.3 3.5 - 5.3 mmol/L Final         Failed - Valid encounter within last 6 months    Recent Outpatient Visits           7 months ago Acute cystitis with hematuria   [place] [facility] Smitty Cords, DO   7 months ago Burning with urination   [place] [facility] [place], Salvadore Oxford, NP   8 months ago DDD (degenerative disc disease), lumbar   [place] [facility] Smitty Cords, DO   10 months ago Annual physical exam   [place] [facility] Smitty Cords, DO   1 year ago Trigger finger, right ring finger   [place] [facility] [place], Salvadore Oxford, NP       Future Appointments             In 1 week Althea Charon, Netta Neat, DO [place] [facility], [facility]            Passed - Patient is not pregnant      Passed - Last BP in normal range    BP Readings from Last 1 Encounters:  01/12/23 128/77

## 2023-07-04 ENCOUNTER — Other Ambulatory Visit: Payer: Self-pay | Admitting: Family Medicine

## 2023-07-04 DIAGNOSIS — K219 Gastro-esophageal reflux disease without esophagitis: Secondary | ICD-10-CM

## 2023-07-05 NOTE — Telephone Encounter (Signed)
Requested Prescriptions  Pending Prescriptions Disp Refills   pantoprazole (PROTONIX) 20 MG tablet [Pharmacy Med Name: Pantoprazole Sodium Oral Tablet Delayed Release 20 MG] 90 tablet 0    Sig: TAKE 1 TABLET EVERY DAY BEFORE BREAKFAST     Gastroenterology: Proton Pump Inhibitors Passed - 07/04/2023  6:15 PM      Passed - Valid encounter within last 12 months    Recent Outpatient Visits           8 months ago Acute cystitis with hematuria   [place] [facility] [place], Netta Neat, DO   8 months ago Burning with urination   [facility] [place], Salvadore Oxford, NP   8 months ago DDD (degenerative disc disease), lumbar   [place] [facility] Smitty Cords, DO   10 months ago Annual physical exam   [place] [facility] Smitty Cords, DO   1 year ago Trigger finger, right ring finger   [place] [facility] [place], Salvadore Oxford, NP       Future Appointments             In 4 days Althea Charon, Netta Neat, DO  [facility]

## 2023-07-09 ENCOUNTER — Ambulatory Visit: Payer: Medicare HMO | Admitting: Family Medicine

## 2023-07-09 ENCOUNTER — Encounter: Payer: Self-pay | Admitting: Family Medicine

## 2023-07-09 VITALS — BP 110/64 | HR 72 | Ht 67.0 in | Wt 257.0 lb

## 2023-07-09 DIAGNOSIS — M25512 Pain in left shoulder: Secondary | ICD-10-CM | POA: Diagnosis not present

## 2023-07-09 DIAGNOSIS — G8929 Other chronic pain: Secondary | ICD-10-CM

## 2023-07-09 DIAGNOSIS — M7542 Impingement syndrome of left shoulder: Secondary | ICD-10-CM

## 2023-07-09 DIAGNOSIS — M5136 Other intervertebral disc degeneration, lumbar region with discogenic back pain only: Secondary | ICD-10-CM | POA: Diagnosis not present

## 2023-07-09 MED ORDER — CYCLOBENZAPRINE HCL 10 MG PO TABS
5.0000 mg | ORAL_TABLET | Freq: Three times a day (TID) | ORAL | 2 refills | Status: DC | PRN
Start: 2023-07-09 — End: 2023-09-20

## 2023-07-09 NOTE — Progress Notes (Signed)
Subjective:    Patient ID: Megan Perez, female    DOB: Apr 28, 1946, 77 y.o.   MRN: [id number]  Megan Perez is a 77 y.o. female presenting on 07/09/2023 for Medication Management and Dizziness (Since shoulder surgery )   HPI  Discussed the use of AI scribe software for clinical note transcription with the patient, who gave verbal consent to proceed.     The patient, with a history of orthopedic surgery six months prior, presents with a burning sensation in the back of her shoulder. She has been managing the pain with Tramadol and Methocarbamol, but has recently experienced dizziness, which she suspects may be a side effect of the Methocarbamol. The patient has been alternating between the two medications, taking one or the other, but not both together. She reports that the dizziness seems to be more pronounced when taking the Methocarbamol.  Previously with [facility], and then we attempted to link up with [facility] but they did not accept her as a patient.   The patient also reports a rapid weight loss, from 270 lbs to 250 lbs, and a decrease in blood pressure, which she speculates could be contributing to her dizziness. She has been on Lisinopril for her blood pressure for some time.  In addition to these concerns, the patient has been experiencing issues with her hand. She has consulted with an orthopedic specialist who suspects carpal tunnel syndrome, but the patient is hesitant to undergo another surgery so soon after her previous one. She has been trying to manage the hand pain with Tylenol and the prescribed muscle relaxant.  The patient lives alone and is active, often moving quickly, which seems to exacerbate the dizziness. She has tried home exercises for vertigo, but these have not been effective. She expresses concern about the cost of physical therapy and is hesitant to pursue this treatment option.           10/12/2022    9:58 AM 08/15/2022    2:26 PM  07/28/2022   11:34 AM  Depression screen PHQ 2/9  Decreased Interest 0 0 0  Down, Depressed, Hopeless 0 0 0  PHQ - 2 Score 0 0 0  Altered sleeping  0 0  Tired, decreased energy  0 0  Change in appetite  0 0  Feeling bad or failure about yourself   0 0  Trouble concentrating  0 0  Moving slowly or fidgety/restless  0 0  Suicidal thoughts  0 0  PHQ-9 Score  0 0  Difficult doing work/chores  Not difficult at all Not difficult at all    Social History   Tobacco Use   Smoking status: Never   Smokeless tobacco: Never  Vaping Use   Vaping status: Never Used  Substance Use Topics   Alcohol use: No   Drug use: No    Review of Systems Per HPI unless specifically indicated above     Objective:    BP 110/64   Pulse 72   Ht 5\' 7"  (1.702 m)   Wt 257 lb (116.6 kg)   SpO2 97%   BMI 40.25 kg/m   Wt Readings from Last 3 Encounters:  07/09/23 257 lb (116.6 kg)  01/12/23 267 lb (121.1 kg)  11/07/22 263 lb 3.2 oz (119.4 kg)    Physical Exam Vitals and nursing note reviewed.  Constitutional:      General: She is not in acute distress.    Appearance: She is well-developed. She  is not diaphoretic.     Comments: Well-appearing, comfortable, cooperative  HENT:     Head: Normocephalic and atraumatic.  Eyes:     General:        Right eye: No discharge.        Left eye: No discharge.     Conjunctiva/sclera: Conjunctivae normal.  Neck:     Thyroid: No thyromegaly.  Cardiovascular:     Rate and Rhythm: Normal rate and regular rhythm.     Heart sounds: Normal heart sounds. No murmur heard. Pulmonary:     Effort: Pulmonary effort is normal. No respiratory distress.     Breath sounds: Normal breath sounds. No wheezing or rales.  Musculoskeletal:        General: Normal range of motion.     Cervical back: Normal range of motion and neck supple.  Lymphadenopathy:     Cervical: No cervical adenopathy.  Skin:    General: Skin is warm and dry.     Findings: No erythema or rash.   Neurological:     Mental Status: She is alert and oriented to person, place, and time.  Psychiatric:        Behavior: Behavior normal.     Comments: Well groomed, good eye contact, normal speech and thoughts      Results for orders placed or performed in visit on 11/02/22  Urine Culture   Specimen: Urine  Result Value Ref Range   MICRO NUMBER: [id number]    SPECIMEN QUALITY: Adequate    Sample Source URINE, CLEAN CATCH    STATUS: FINAL    ISOLATE 1: Proteus mirabilis (A)       Susceptibility   Proteus mirabilis - URINE CULTURE, REFLEX    AMOX/CLAVULANIC <=2 Sensitive     AMPICILLIN <=2 Sensitive     AMPICILLIN/SULBACTAM <=2 Sensitive     CEFAZOLIN* <=4 Not Reportable      * For infections other than uncomplicated UTI caused by E. coli, K. pneumoniae or P. mirabilis: Cefazolin is resistant if MIC > or = 8 mcg/mL. (Distinguishing susceptible versus intermediate for isolates with MIC < or = 4 mcg/mL requires additional testing.) For uncomplicated UTI caused by E. coli, K. pneumoniae or P. mirabilis: Cefazolin is susceptible if MIC <32 mcg/mL and predicts susceptible to the oral agents cefaclor, cefdinir, cefpodoxime, cefprozil, cefuroxime, cephalexin and loracarbef.     CEFTAZIDIME <=1 Sensitive     CEFEPIME <=1 Sensitive     CEFTRIAXONE <=1 Sensitive     CIPROFLOXACIN <=0.25 Sensitive     LEVOFLOXACIN <=0.12 Sensitive     GENTAMICIN <=1 Sensitive     IMIPENEM 2 Intermediate     NITROFURANTOIN 128 Resistant     PIP/TAZO <=4 Sensitive     TOBRAMYCIN <=1 Sensitive     TRIMETH/SULFA* <=20 Sensitive      * For infections other than uncomplicated UTI caused by E. coli, K. pneumoniae or P. mirabilis: Cefazolin is resistant if MIC > or = 8 mcg/mL. (Distinguishing susceptible versus intermediate for isolates with MIC < or = 4 mcg/mL requires additional testing.) For uncomplicated UTI caused by E. coli, K. pneumoniae or P. mirabilis: Cefazolin is susceptible if MIC <32  mcg/mL and predicts susceptible to the oral agents cefaclor, cefdinir, cefpodoxime, cefprozil, cefuroxime, cephalexin and loracarbef. Legend: S = Susceptible  I = Intermediate R = Resistant  NS = Not susceptible * = Not tested  NR = Not reported **NN = See antimicrobic comments   POCT urinalysis dipstick  Result Value  Ref Range   Color, UA     Clarity, UA     Glucose, UA Negative Negative   Bilirubin, UA Negative    Ketones, UA Negative    Spec Grav, UA <=1.005 (A) 1.010 - 1.025   Blood, UA Trace    pH, UA 8.5 (A) 5.0 - 8.0   Protein, UA Negative Negative   Urobilinogen, UA 0.2 0.2 or 1.0 E.U./dL   Nitrite, UA Negative    Leukocytes, UA Small (1+) (A) Negative   Appearance     Odor        Assessment & Plan:   Problem List Items Addressed This Visit     Chronic shoulder pain (Left) (Chronic)   Relevant Medications   cyclobenzaprine (FLEXERIL) 10 MG tablet   DDD (degenerative disc disease), lumbar (Chronic)   Relevant Medications   cyclobenzaprine (FLEXERIL) 10 MG tablet   Other Visit Diagnoses     Rotator cuff impingement syndrome of left shoulder    -  Primary   Relevant Medications   cyclobenzaprine (FLEXERIL) 10 MG tablet       Assessment and Plan    Dizziness Possible medication side effect from Methocarbamol. Vertigo also reported with rapid movements. Patient has tried home exercises for vertigo without improvement. -Discontinue Methocarbamol. Already failed Baclofen, Tizanidine previously -Start Cyclobenzaprine (Flexeril) 10mg , half to whole tablet as needed for muscle relaxation. -Consider reducing Lisinopril from 20mg  to 10mg  if dizziness persists after medication switch. -Consider referral to a balance therapist if vertigo persists.  Left Shoulder Pain Managed with Tramadol and previously Methocarbamol. -Continue Tramadol as needed, with caution regarding drowsiness and driving. -Replace Methocarbamol with Cyclobenzaprine (Flexeril) as  above.  Carpal Tunnel Syndrome Diagnosed by another provider, patient is hesitant about further intervention at this time. -No changes to plan at this time, continue to monitor symptoms.  Hypertension Well controlled, recent weight loss noted. -Continue Lisinopril 20mg  daily, consider reducing to 10mg  if dizziness persists after medication switch.        Meds ordered this encounter  Medications   cyclobenzaprine (FLEXERIL) 10 MG tablet    Sig: Take 0.5-1 tablets (5-10 mg total) by mouth 3 (three) times daily as needed for muscle spasms.    Dispense:  30 tablet    Refill:  2      Follow up plan: Return if symptoms worsen or fail to improve.   Saralyn Pilar, DO [facility] 07/09/2023, 9:53 AM

## 2023-07-09 NOTE — Patient Instructions (Addendum)
Thank you for coming to the office today.  Dizziness Likely due to multiple factors  Med side effect - if Methocarbamol is causing dizziness, we can switch to the Cyclobenzaprine Flexeril new rx 10mg  tab, take HALF or WHOLE tab as needed for a new muscle relaxant.  Caution while taking Tramadol  If there is any vertigo or spinning, we may need a therapy consult in future at [facility].  If BP has improved, you can Lower your blood pressure pill - now take HALF tab of Lisinopril from 20mg  down to 10mg . If interested.  Please schedule a Follow-up Appointment to: Return if symptoms worsen or fail to improve.  If you have any other questions or concerns, please feel free to call the office or send a message through MyChart. You may also schedule an earlier appointment if necessary.  Additionally, you may be receiving a survey about your experience at our office within a few days to 1 week by e-mail or mail. We value your feedback.  Saralyn Pilar, DO [facility], [place]

## 2023-08-24 ENCOUNTER — Ambulatory Visit (INDEPENDENT_AMBULATORY_CARE_PROVIDER_SITE_OTHER): Payer: Medicare HMO

## 2023-08-24 DIAGNOSIS — Z780 Asymptomatic menopausal state: Secondary | ICD-10-CM | POA: Diagnosis not present

## 2023-08-24 DIAGNOSIS — Z0000 Encounter for general adult medical examination without abnormal findings: Secondary | ICD-10-CM | POA: Diagnosis not present

## 2023-08-24 NOTE — Progress Notes (Signed)
Subjective:   Megan Perez is a 77 y.o. female who presents for Medicare Annual (Subsequent) preventive examination.  Visit Complete: Virtual I connected with  [name] on 08/24/23 by a audio enabled telemedicine application and verified that I am speaking with the correct person using two identifiers.  Patient Location: Home  Provider Location: Office/Clinic  I discussed the limitations of evaluation and management by telemedicine. The patient expressed understanding and agreed to proceed.  Vital Signs: Because this visit was a virtual/telehealth visit, some criteria may be missing or patient reported. Any vitals not documented were not able to be obtained and vitals that have been documented are patient reported.  Cardiac Risk Factors include: advanced age (>61men, >89 women);hypertension;sedentary lifestyle;obesity (BMI >30kg/m2)     Objective:    There were no vitals filed for this visit. There is no height or weight on file to calculate BMI.     08/24/2023    3:23 PM 10/12/2022    9:58 AM 07/28/2022   11:38 AM 07/05/2021    3:35 PM 09/14/2020    9:10 AM 06/29/2020    2:52 PM 03/18/2020   12:25 PM  Advanced Directives  Does Patient Have a Medical Advance Directive? No No No No No No No  Would patient like information on creating a medical advance directive? No - Patient declined No - Patient declined No - Patient declined  No - Guardian declined  No - Patient declined    Current Medications (verified) Outpatient Encounter Medications as of 08/24/2023  Medication Sig   acetaminophen (TYLENOL) 500 MG tablet Take 1,300 mg by mouth every 6 (six) hours as needed.    azelastine (OPTIVAR) 0.05 % ophthalmic solution Place 2 drops into both eyes daily as needed.   cyclobenzaprine (FLEXERIL) 10 MG tablet Take 0.5-1 tablets (5-10 mg total) by mouth 3 (three) times daily as needed for muscle spasms.   diclofenac sodium (VOLTAREN) 1 % GEL APPLY 2 GRAMS TOPICALLY FOUR TIMES DAILY  AS NEEDED FOR MODERATE PAIN OF BACK AND KNEES   fluticasone (FLONASE) 50 MCG/ACT nasal spray Place 2 sprays into both nostrils daily. Use for 4-6 weeks then stop and use seasonally or as needed.   lisinopril (ZESTRIL) 20 MG tablet TAKE 1 TABLET EVERY DAY   pantoprazole (PROTONIX) 20 MG tablet TAKE 1 TABLET EVERY DAY BEFORE BREAKFAST   traMADol (ULTRAM) 50 MG tablet Take 1 tablet (50 mg total) by mouth at bedtime as needed for moderate pain. (Patient taking differently: Take 50 mg by mouth at bedtime as needed for moderate pain (pain score 4-6). Every 6 hours)   No facility-administered encounter medications on file as of 08/24/2023.    Allergies (verified) Baclofen, Meclizine, Other, Penicillins, Prednisone, Prevacid [lansoprazole], Gabapentin, Oxycodone, Pregabalin er, and Tizanidine hcl   History: Past Medical History:  Diagnosis Date   Anxiety    Asthma    reactive airway and shortness of breath w/ exertion in hot temperatures   Bronchitis    GERD (gastroesophageal reflux disease)    Hypertension    Morbid obesity with BMI of 40.0-44.9, adult (HCC) 04/08/2012   OA (osteoarthritis)    knee   Osteoporosis    Prediabetes    Sinus infection    10/2018   Venous (peripheral) insufficiency 08/19/2010   Qualifier: Diagnosis of  By: [name], [place]     Past Surgical History:  Procedure Laterality Date   ABDOMINAL HYSTERECTOMY     CARPAL TUNNEL RELEASE Right    CHOLECYSTECTOMY  COLONOSCOPY WITH PROPOFOL N/A 06/19/2017   Procedure: COLONOSCOPY WITH PROPOFOL;  Surgeon: [name], MD;  Location: [facility] ENDOSCOPY;  Service: Endoscopy;  Laterality: N/A;   HEEL SPUR SURGERY     REPLACEMENT TOTAL KNEE     left   REPLACEMENT TOTAL KNEE Right    SHOULDER ARTHROSCOPY WITH OPEN ROTATOR CUFF REPAIR Right 09/15/2015   Procedure: SHOULDER ARTHROSCOPY WITH OPEN ROTATOR CUFF REPAIR;  Surgeon: [name], MD;  Location: [facility] ORS;  Service: Orthopedics;  Laterality: Right;   SHOULDER  SURGERY Left    Family History  Adopted: Yes  Problem Relation Age of Onset   Heart disease Father    Subarachnoid hemorrhage Mother    Hypertension Mother    Subarachnoid hemorrhage Sister    Hypertension Sister    Hypertension Sister    Hypertension Sister    Healthy Sister    Breast cancer Neg Hx    Social History   Socioeconomic History   Marital status: Widowed    Spouse name: Not on file   Number of children: Not on file   Years of education: Not on file   Highest education level: Associate degree: academic program  Occupational History   Occupation: retired  Tobacco Use   Smoking status: Never   Smokeless tobacco: Never  Vaping Use   Vaping status: Never Used  Substance and Sexual Activity   Alcohol use: No   Drug use: No   Sexual activity: Not Currently    Birth control/protection: Surgical  Other Topics Concern   Not on file  Social History Narrative   Retired.  Lives alone   Social Determinants of Health   Financial Resource Strain: Low Risk  (08/24/2023)   Overall Financial Resource Strain (CARDIA)    Difficulty of Paying Living Expenses: Not very hard  Food Insecurity: No Food Insecurity (08/24/2023)   Hunger Vital Sign    Worried About Running Out of Food in the Last Year: Never true    Ran Out of Food in the Last Year: Never true  Transportation Needs: No Transportation Needs (08/24/2023)   PRAPARE - Administrator, Civil Service (Medical): No    Lack of Transportation (Non-Medical): No  Physical Activity: Inactive (08/24/2023)   Exercise Vital Sign    Days of Exercise per Week: 0 days    Minutes of Exercise per Session: 0 min  Stress: Stress Concern Present (08/24/2023)   [employer] of Occupational Health - Occupational Stress Questionnaire    Feeling of Stress : To some extent  Social Connections: Moderately Isolated (08/24/2023)   Social Connection and Isolation Panel [NHANES]    Frequency of Communication with Friends and  Family: More than three times a week    Frequency of Social Gatherings with Friends and Family: Never    Attends Religious Services: More than 4 times per year    Active Member of [employer] or Organizations: No    Attends Banker Meetings: Never    Marital Status: Widowed    Tobacco Counseling Counseling given: Not Answered   Clinical Intake:  Pre-visit preparation completed: Yes  Pain : No/denies pain     BMI - recorded: 40.3 Nutritional Status: BMI > 30  Obese Nutritional Risks: None Diabetes: No  How often do you need to have someone help you when you read instructions, pamphlets, or other written materials from your doctor or pharmacy?: 1 - Never  Interpreter Needed?: No  Information entered by :: Kennedy Bucker, LPN  Activities of Daily Living    08/24/2023    3:25 PM  In your present state of health, do you have any difficulty performing the following activities:  Hearing? 0  Vision? 0  Difficulty concentrating or making decisions? 0  Walking or climbing stairs? 1  Comment knee pain- going up hurts  Dressing or bathing? 0  Doing errands, shopping? 0  Preparing Food and eating ? N  Using the Toilet? N  In the past six months, have you accidently leaked urine? N  Do you have problems with loss of bowel control? N  Managing your Medications? N  Managing your Finances? N  Housekeeping or managing your Housekeeping? N    Patient Care Team: Smitty Cords, DO as PCP - General (Family Medicine) Laurier Nancy, MD as Consulting Physician (Cardiology) [name], MD (Specialist) [facility], [facility]  Indicate any recent Medical Services you may have received from other than [facility] providers in the past year (date may be approximate).     Assessment:   This is a routine wellness examination for [facility]  Hearing/Vision screen Hearing Screening - Comments:: No aids Vision Screening - Comments:: Wears glasses- Dr.Woodard   Goals  Addressed             This Visit's Progress    Cut out extra servings         Depression Screen    08/24/2023    3:21 PM 10/12/2022    9:58 AM 08/15/2022    2:26 PM 07/28/2022   11:34 AM 04/12/2022   11:15 AM 12/06/2021    9:46 AM 09/27/2021    8:25 AM  PHQ 2/9 Scores  PHQ - 2 Score 1 0 0 0 1 1 1   PHQ- 9 Score 1  0 0 5 5 4     Fall Risk    08/24/2023    3:24 PM 10/12/2022    9:58 AM 10/02/2022    9:52 AM 08/15/2022    2:25 PM 07/28/2022   11:38 AM  Fall Risk   Falls in the past year? 1 1 0 1 1  Comment  May 2023     Number falls in past yr: 1 0  1 1  Injury with Fall? 0 1  0 0  Risk for fall due to : History of fall(s) History of fall(s)  No Fall Risks No Fall Risks  Follow up Falls evaluation completed;Falls prevention discussed   Falls evaluation completed;Falls prevention discussed Falls prevention discussed;Falls evaluation completed    MEDICARE RISK AT HOME: Medicare Risk at Home Any stairs in or around the home?: Yes If so, are there any without handrails?: No Home free of loose throw rugs in walkways, pet beds, electrical cords, etc?: Yes Adequate lighting in your home to reduce risk of falls?: Yes Life alert?: No Use of a cane, walker or w/c?: Yes (cane all the time) Grab bars in the bathroom?: Yes Shower chair or bench in shower?: Yes Elevated toilet seat or a handicapped toilet?: Yes  TIMED UP AND GO:  Was the test performed?  No    Cognitive Function:        08/24/2023    3:26 PM 07/28/2022   11:42 AM 07/05/2021    3:41 PM 06/29/2020    2:58 PM 04/23/2018    8:40 AM  6CIT Screen  What Year? 0 points 0 points 0 points 0 points 0 points  What month? 0 points 0 points 0 points 0 points 0  points  What time? 0 points 0 points 0 points 0 points 0 points  Count back from 20 0 points 0 points 0 points 0 points 0 points  Months in reverse 0 points 0 points 2 points 0 points 0 points  Repeat phrase 2 points 0 points 0 points 0 points 2 points  Total  Score 2 points 0 points 2 points 0 points 2 points    Immunizations Immunization History  Administered Date(s) Administered   Fluad Quad(high Dose 65+) 05/28/2019, 05/31/2020, 06/07/2021, 06/27/2022   Influenza Split 06/28/2012, 06/22/2015, 05/23/2016   Influenza Whole 08/20/2010   Influenza, High Dose Seasonal PF 08/06/2013, 06/15/2017, 07/16/2018   Influenza, Seasonal, Injecte, Preservative Fre 06/11/2014   Influenza,inj,Quad PF,6+ Mos 12/10/2014   Influenza-Unspecified 04/20/2013   Moderna Sars-Covid-2 Vaccination 11/15/2019, 12/13/2019, 07/14/2020, 01/15/2021, 06/14/2021   PPD Test 05/01/2016   Pneumococcal Conjugate-13 06/22/2015   Pneumococcal Polysaccharide-23 06/28/2012   Tdap 09/19/2007   Zoster Recombinant(Shingrix) 03/14/2018, 07/31/2018    TDAP status: Due, Education has been provided regarding the importance of this vaccine. Advised may receive this vaccine at local pharmacy or Health Dept. Aware to provide a copy of the vaccination record if obtained from local pharmacy or Health Dept. Verbalized acceptance and understanding.  Flu Vaccine status: Declined, Education has been provided regarding the importance of this vaccine but patient still declined. Advised may receive this vaccine at local pharmacy or Health Dept. Aware to provide a copy of the vaccination record if obtained from local pharmacy or Health Dept. Verbalized acceptance and understanding.  Pneumococcal vaccine status: Up to date  Covid-19 vaccine status: Completed vaccines  Qualifies for Shingles Vaccine? Yes   Zostavax completed No   Shingrix Completed?: Yes  Screening Tests Health Maintenance  Topic Date Due   DEXA SCAN  Never done   DTaP/Tdap/Td (2 - Td or Tdap) 09/18/2017   COVID-19 Vaccine (6 - 2023-24 season) 05/20/2023   INFLUENZA VACCINE  12/17/2023 (Originally 04/19/2023)   Medicare Annual Wellness (AWV)  08/23/2024   Pneumonia Vaccine 40+ Years old  Completed   Hepatitis C Screening   Completed   Zoster Vaccines- Shingrix  Completed   HPV VACCINES  Aged Out   Colonoscopy  Discontinued    Health Maintenance  Health Maintenance Due  Topic Date Due   DEXA SCAN  Never done   DTaP/Tdap/Td (2 - Td or Tdap) 09/18/2017   COVID-19 Vaccine (6 - 2023-24 season) 05/20/2023    Colorectal cancer screening: No longer required.   Mammogram status: No longer required due to age.- wants every year  Bone Density status: Ordered 08/24/23. Pt provided with contact info and advised to call to schedule appt.  Lung Cancer Screening: (Low Dose CT Chest recommended if Age 78-80 years, 20 pack-year currently smoking OR have quit w/in 15years.) does not qualify.    Additional Screening:  Hepatitis C Screening: does qualify; Completed 02/16/16  Vision Screening: Recommended annual ophthalmology exams for early detection of glaucoma and other disorders of the eye. Is the patient up to date with their annual eye exam?  Yes  Who is the provider or what is the name of the office in which the patient attends annual eye exams? Dr.Woodard If pt is not established with a provider, would they like to be referred to a provider to establish care? No .   Dental Screening: Recommended annual dental exams for proper oral hygiene   Community Resource Referral / Chronic Care Management: CRR required this visit?  No   CCM  required this visit?  No     Plan:     I have personally reviewed and noted the following in the patient's chart:   Medical and social history Use of alcohol, tobacco or illicit drugs  Current medications and supplements including opioid prescriptions. Patient is not currently taking opioid prescriptions. Functional ability and status Nutritional status Physical activity Advanced directives List of other physicians Hospitalizations, surgeries, and ER visits in previous 12 months Vitals Screenings to include cognitive, depression, and falls Referrals and  appointments  In addition, I have reviewed and discussed with patient certain preventive protocols, quality metrics, and best practice recommendations. A written personalized care plan for preventive services as well as general preventive health recommendations were provided to patient.     Hal Hope, LPN   16/09/958   After Visit Summary: (MyChart) Due to this being a telephonic visit, the after visit summary with patients personalized plan was offered to patient via MyChart   Nurse Notes:  dexa referral sent

## 2023-08-24 NOTE — Patient Instructions (Addendum)
Ms. Coch , Thank you for taking time to come for your Medicare Wellness Visit. I appreciate your ongoing commitment to your health goals. Please review the following plan we discussed and let me know if I can assist you in the future.   Referrals/Orders/Follow-Ups/Clinician Recommendations: bone density scan referral sent  You have an order for:  []   2D Mammogram  []   3D Mammogram  [x]   Bone Density     Please call for appointment:  [facility]  [address] [place] [address] [phone] [facility] [address] [place], [place] [address] [phone] [facility] [address] Geanie Logan [place], [place] [address] [phone]   Make sure to wear two-piece clothing.  No lotions, powders, or deodorants the day of the appointment. Make sure to bring picture ID and insurance card.  Bring list of medications you are currently taking including any supplements.   Schedule your [place] screening mammogram through MyChart!   Log into your MyChart account.  Go to 'Visit' (or 'Appointments' if on mobile App) --> Schedule an Appointment  Under 'Select a Reason for Visit' choose the Mammogram Screening option.  Complete the pre-visit questions and select the time and place that best fits your schedule.   This is a list of the screening recommended for you and due dates:  Health Maintenance  Topic Date Due   DEXA scan (bone density measurement)  Never done   DTaP/Tdap/Td vaccine (2 - Td or Tdap) 09/18/2017   COVID-19 Vaccine (6 - 2023-24 season) 05/20/2023   Flu Shot  12/17/2023*   Medicare Annual Wellness Visit  08/23/2024   Pneumonia Vaccine  Completed   Hepatitis C Screening  Completed   Zoster (Shingles) Vaccine  Completed   HPV Vaccine  Aged Out   Colon Cancer Screening  Discontinued  *Topic was postponed. The date shown is not the original due  date.    Advanced directives: (ACP Link)Information on Advanced Care Planning can be found at [facility] of [facility] Directives Advance Health Care Directives ([email or web address]   Next Medicare Annual Wellness Visit scheduled for next year: Yes    08/29/24 @ 2:40 pm by video

## 2023-09-07 ENCOUNTER — Other Ambulatory Visit: Payer: Self-pay | Admitting: Family Medicine

## 2023-09-10 NOTE — Telephone Encounter (Signed)
Medication not listed on current medication list Requested Prescriptions  Pending Prescriptions Disp Refills   albuterol (VENTOLIN HFA) 108 (90 Base) MCG/ACT inhaler [Pharmacy Med Name: ALBUTEROL HFA INH (200 PUFFS) 6.7GM] 6.7 g     Sig: INHALE 2 PUFFS BY MOUTH EVERY 4 HOURS AS NEEDED FOR WHEEZING     Pulmonology:  Beta Agonists 2 Passed - 09/10/2023 11:30 AM      Passed - Last BP in normal range    BP Readings from Last 1 Encounters:  07/09/23 110/64         Passed - Last Heart Rate in normal range    Pulse Readings from Last 1 Encounters:  07/09/23 72         Passed - Valid encounter within last 12 months    Recent Outpatient Visits           2 months ago Rotator cuff impingement syndrome of left shoulder   [place] [facility] Smitty Cords, DO   10 months ago Acute cystitis with hematuria   [place] [facility] Smitty Cords, DO   10 months ago Burning with urination   [place] [facility] [place], Salvadore Oxford, NP   10 months ago DDD (degenerative disc disease), lumbar   [place] [facility] Smitty Cords, DO   1 year ago Annual physical exam   [place] [facility] Smitty Cords, DO       Future Appointments             In 1 week Althea Charon, Netta Neat, DO [place] [facility]

## 2023-09-16 ENCOUNTER — Other Ambulatory Visit: Payer: Self-pay | Admitting: Family Medicine

## 2023-09-16 DIAGNOSIS — I10 Essential (primary) hypertension: Secondary | ICD-10-CM

## 2023-09-16 DIAGNOSIS — J4521 Mild intermittent asthma with (acute) exacerbation: Secondary | ICD-10-CM

## 2023-09-16 DIAGNOSIS — K219 Gastro-esophageal reflux disease without esophagitis: Secondary | ICD-10-CM

## 2023-09-20 ENCOUNTER — Ambulatory Visit: Payer: Medicare HMO | Admitting: Family Medicine

## 2023-09-20 ENCOUNTER — Encounter: Payer: Self-pay | Admitting: Family Medicine

## 2023-09-20 VITALS — BP 130/78 | HR 82 | Ht 67.0 in | Wt 267.0 lb

## 2023-09-20 DIAGNOSIS — M25462 Effusion, left knee: Secondary | ICD-10-CM | POA: Diagnosis not present

## 2023-09-20 DIAGNOSIS — Z6841 Body Mass Index (BMI) 40.0 and over, adult: Secondary | ICD-10-CM | POA: Diagnosis not present

## 2023-09-20 DIAGNOSIS — M65341 Trigger finger, right ring finger: Secondary | ICD-10-CM

## 2023-09-20 DIAGNOSIS — M25562 Pain in left knee: Secondary | ICD-10-CM

## 2023-09-20 DIAGNOSIS — G8929 Other chronic pain: Secondary | ICD-10-CM | POA: Diagnosis not present

## 2023-09-20 DIAGNOSIS — G5601 Carpal tunnel syndrome, right upper limb: Secondary | ICD-10-CM

## 2023-09-20 MED ORDER — TRAMADOL HCL 50 MG PO TABS
50.0000 mg | ORAL_TABLET | Freq: Every evening | ORAL | 2 refills | Status: DC | PRN
Start: 2023-09-20 — End: 2023-11-21

## 2023-09-20 MED ORDER — LISINOPRIL 20 MG PO TABS: ORAL_TABLET | ORAL | 3 refills | Status: DC

## 2023-09-20 MED ORDER — ALBUTEROL SULFATE HFA 108 (90 BASE) MCG/ACT IN AERS
2.0000 | INHALATION_SPRAY | Freq: Four times a day (QID) | RESPIRATORY_TRACT | 2 refills | Status: AC | PRN
Start: 2023-09-20 — End: ?

## 2023-09-20 NOTE — Patient Instructions (Addendum)
 Thank you for coming to the office today.  We will try to contact [facility] Dr Hardin Czar first to see if she can assist with further procedure possibly at [facility] if need surgery.  You can call them next week to check in and follow-up.  Refilled medications to [facility] and Tramadol  locally  Please schedule a Follow-up Appointment to: Return if symptoms worsen or fail to improve.  If you have any other questions or concerns, please feel free to call the office or send a message through MyChart. You may also schedule an earlier appointment if necessary.  Additionally, you may be receiving a survey about your experience at our office within a few days to 1 week by e-mail or mail. We value your feedback.  Marsa Officer, DO [facility] [place], [place]

## 2023-09-20 NOTE — Progress Notes (Signed)
 Subjective:    Patient ID: Megan Perez, female    DOB: [birth year], 78 y.o.   MRN: [id number]  Megan Perez is a 78 y.o. female presenting on 09/20/2023 for Medical Management of Chronic Issues   HPI  Discussed the use of AI scribe software for clinical note transcription with the patient, who gave verbal consent to proceed.  History of Present Illness    Right Hand Pain / Triggering / Carpal Tunnel  The patient, with a history of chronic pain, presents with worsening pain in the right hand, specifically in the middle three fingers. She reports that these fingers often lock and become numb, causing significant discomfort and functional impairment, particularly when preparing meals.   She is followed by [facility]. Has seen Dr Leora for her Left Shoulder. Also has already been referred for this Hand problem. She has seen Dr Hardin Czar and had injection, she experienced some increased pain and sensitivity with injection.   Today she is requesting to have another evaluation or opinion on her hand and carpal tunnel / trigger fingers to see if any procedure option is available. She request anesthesia instead of local numbing.  The patient also reports a recent experience of severe anxiety during a crowded event, which manifested as breathlessness, a sensation of impending collapse, and an overwhelming urge to cry. This was a new and distressing experience for the patient.  In terms of medication, the patient has been taking cyclobenzaprine  (Flexeril ) 10mg , which she reports is causing intolerable side effects, including dizziness and a general feeling of unwellness. She has stopped taking this medication and is seeking an alternative. She also takes tramadol  for pain management, which she reports is effective but may be contributing to her dizziness. The patient also mentions an inhaler that has been discontinued and is seeking a replacement.          09/20/2023   10:15 AM 08/24/2023     3:21 PM 10/12/2022    9:58 AM  Depression screen PHQ 2/9  Decreased Interest 1 0 0  Down, Depressed, Hopeless 0 1 0  PHQ - 2 Score 1 1 0  Altered sleeping 2 0   Tired, decreased energy 0 0   Change in appetite 0 0   Feeling bad or failure about yourself  0 0   Trouble concentrating 0 0   Moving slowly or fidgety/restless 0 0   Suicidal thoughts 0 0   PHQ-9 Score 3 1   Difficult doing work/chores  Not difficult at all        09/20/2023   10:15 AM 08/15/2022    2:26 PM 04/12/2022   11:15 AM 12/06/2021    9:46 AM  GAD 7 : Generalized Anxiety Score  Nervous, Anxious, on Edge 0 0 0 0  Control/stop worrying 1 1 1 1   Worry too much - different things 1 1 1 1   Trouble relaxing 1 1 1 1   Restless 0 1 1 1   Easily annoyed or irritable 1 0 0 0  Afraid - awful might happen 0 0 0 0  Total GAD 7 Score 4 4 4 4   Anxiety Difficulty  Not difficult at all Not difficult at all Not difficult at all    Social History   Tobacco Use   Smoking status: Never   Smokeless tobacco: Never  Vaping Use   Vaping status: Never Used  Substance Use Topics   Alcohol use: No   Drug use: No  Review of Systems Per HPI unless specifically indicated above     Objective:    BP 130/78   Pulse 82   Ht 5' 7 (1.702 m)   Wt 267 lb (121.1 kg)   SpO2 91%   BMI 41.82 kg/m   Wt Readings from Last 3 Encounters:  09/20/23 267 lb (121.1 kg)  07/09/23 257 lb (116.6 kg)  01/12/23 267 lb (121.1 kg)    Physical Exam Vitals and nursing note reviewed.  Constitutional:      General: She is not in acute distress.    Appearance: Normal appearance. She is well-developed. She is obese. She is not diaphoretic.     Comments: Well-appearing, comfortable, cooperative  HENT:     Head: Normocephalic and atraumatic.  Eyes:     General:        Right eye: No discharge.        Left eye: No discharge.     Conjunctiva/sclera: Conjunctivae normal.  Cardiovascular:     Rate and Rhythm: Normal rate.  Pulmonary:      Effort: Pulmonary effort is normal.  Musculoskeletal:     Comments: Right hand, middle, ring fingers with reduced   Skin:    General: Skin is warm and dry.     Findings: No erythema or rash.  Neurological:     Mental Status: She is alert and oriented to person, place, and time.  Psychiatric:        Mood and Affect: Mood normal.        Behavior: Behavior normal.        Thought Content: Thought content normal.     Comments: Well groomed, good eye contact, normal speech and thoughts     Results for orders placed or performed in visit on 11/02/22  Urine Culture   Collection Time: 11/02/22  2:43 PM   Specimen: Urine  Result Value Ref Range   MICRO NUMBER: [id number]    SPECIMEN QUALITY: Adequate    Sample Source URINE, CLEAN CATCH    STATUS: FINAL    ISOLATE 1: Proteus mirabilis (A)       Susceptibility   Proteus mirabilis - URINE CULTURE, REFLEX    AMOX/CLAVULANIC <=2 Sensitive     AMPICILLIN <=2 Sensitive     AMPICILLIN/SULBACTAM <=2 Sensitive     CEFAZOLIN * <=4 Not Reportable      * For infections other than uncomplicated UTI caused by E. coli, K. pneumoniae or P. mirabilis: Cefazolin  is resistant if MIC > or = 8 mcg/mL. (Distinguishing susceptible versus intermediate for isolates with MIC < or = 4 mcg/mL requires additional testing.) For uncomplicated UTI caused by E. coli, K. pneumoniae or P. mirabilis: Cefazolin  is susceptible if MIC <32 mcg/mL and predicts susceptible to the oral agents cefaclor, cefdinir, cefpodoxime, cefprozil, cefuroxime, cephalexin and loracarbef.     CEFTAZIDIME <=1 Sensitive     CEFEPIME <=1 Sensitive     CEFTRIAXONE <=1 Sensitive     CIPROFLOXACIN <=0.25 Sensitive     LEVOFLOXACIN  <=0.12 Sensitive     GENTAMICIN <=1 Sensitive     IMIPENEM 2 Intermediate     NITROFURANTOIN  128 Resistant     PIP/TAZO <=4 Sensitive     TOBRAMYCIN <=1 Sensitive     TRIMETH /SULFA * <=20 Sensitive      * For infections other than uncomplicated UTI caused by E.  coli, K. pneumoniae or P. mirabilis: Cefazolin  is resistant if MIC > or = 8 mcg/mL. (Distinguishing susceptible versus intermediate for isolates with MIC <  or = 4 mcg/mL requires additional testing.) For uncomplicated UTI caused by E. coli, K. pneumoniae or P. mirabilis: Cefazolin  is susceptible if MIC <32 mcg/mL and predicts susceptible to the oral agents cefaclor, cefdinir, cefpodoxime, cefprozil, cefuroxime, cephalexin and loracarbef. Legend: S = Susceptible  I = Intermediate R = Resistant  NS = Not susceptible * = Not tested  NR = Not reported **NN = See antimicrobic comments   POCT urinalysis dipstick   Collection Time: 11/02/22  3:20 PM  Result Value Ref Range   Color, UA     Clarity, UA     Glucose, UA Negative Negative   Bilirubin, UA Negative    Ketones, UA Negative    Spec Grav, UA <=1.005 (A) 1.010 - 1.025   Blood, UA Trace    pH, UA 8.5 (A) 5.0 - 8.0   Protein, UA Negative Negative   Urobilinogen, UA 0.2 0.2 or 1.0 E.U./dL   Nitrite, UA Negative    Leukocytes, UA Small (1+) (A) Negative   Appearance     Odor        Assessment & Plan:   Problem List Items Addressed This Visit     Morbid obesity with BMI of 40.0-44.9, adult (HCC) - Primary   Other Visit Diagnoses       Chronic pain of left knee       Relevant Medications   traMADol  (ULTRAM ) 50 MG tablet     Pain and swelling of left knee       Relevant Medications   traMADol  (ULTRAM ) 50 MG tablet         Trigger Finger, R hand Severe pain and locking in the right hand, affecting three fingers. Previous consultation with Dr. Francisco, and injection, limited results -Contact Dr. Francisco to discuss alternative treatment options, including possible anesthesia prior to procedure. Patient is considering other location if needs 2nd opinion Note *update - I spoke with staff at [facility] for Dr Francisco, they advised that Dr Francisco can do procedures for this issue and the patient will need a follow up apt to  discuss details and plan of future procedure. - Our clinic staff will notify patient that she will need to call Dr Hardin Francisco to schedule follow-up and review future options for procedure with anesthesia as discussed.  Muscle Spasms Cyclobenzaprine  (Flexeril ) 10mg  causing dizziness and unsteadiness, not providing adequate relief. -Discontinue Cyclobenzaprine  due to side effects. She has failed most other muscle relaxants at this time. Will not plan to re order.  Chronic Back / Shoulder Pain Pain Management Continues Tramadol  nightly. Discussed possible side effect dizziness -Renew Tramadol  prescription at [facility].  Asthma Previous inhaler discontinued, replacement required. -Order new Albuterol  inhaler through [facility].  Anxiety Recent episode of overwhelming anxiety during a crowded event, possibly a panic attack. -Consider referral to mental health professional for evaluation and management if symptoms persist.         No orders of the defined types were placed in this encounter.   Meds ordered this encounter  Medications   traMADol  (ULTRAM ) 50 MG tablet    Sig: Take 1 tablet (50 mg total) by mouth at bedtime as needed for moderate pain (pain score 4-6).    Dispense:  30 tablet    Refill:  2    Follow up plan: Return if symptoms worsen or fail to improve.    Marsa Officer, DO [facility] 09/20/2023, 10:34 AM

## 2023-09-21 ENCOUNTER — Encounter: Payer: Self-pay | Admitting: Family Medicine

## 2023-09-25 DIAGNOSIS — M65331 Trigger finger, right middle finger: Secondary | ICD-10-CM | POA: Diagnosis not present

## 2023-09-25 DIAGNOSIS — G5621 Lesion of ulnar nerve, right upper limb: Secondary | ICD-10-CM | POA: Diagnosis not present

## 2023-09-25 DIAGNOSIS — R2231 Localized swelling, mass and lump, right upper limb: Secondary | ICD-10-CM | POA: Diagnosis not present

## 2023-10-04 DIAGNOSIS — Z01818 Encounter for other preprocedural examination: Secondary | ICD-10-CM | POA: Diagnosis not present

## 2023-10-12 DIAGNOSIS — Z96653 Presence of artificial knee joint, bilateral: Secondary | ICD-10-CM | POA: Diagnosis not present

## 2023-10-12 DIAGNOSIS — M65331 Trigger finger, right middle finger: Secondary | ICD-10-CM | POA: Diagnosis not present

## 2023-10-12 DIAGNOSIS — M67441 Ganglion, right hand: Secondary | ICD-10-CM | POA: Diagnosis not present

## 2023-10-12 DIAGNOSIS — Z96612 Presence of left artificial shoulder joint: Secondary | ICD-10-CM | POA: Diagnosis not present

## 2023-10-12 DIAGNOSIS — M71341 Other bursal cyst, right hand: Secondary | ICD-10-CM | POA: Diagnosis not present

## 2023-10-12 DIAGNOSIS — G5601 Carpal tunnel syndrome, right upper limb: Secondary | ICD-10-CM | POA: Diagnosis not present

## 2023-10-12 DIAGNOSIS — G5621 Lesion of ulnar nerve, right upper limb: Secondary | ICD-10-CM | POA: Diagnosis not present

## 2023-10-12 DIAGNOSIS — G8918 Other acute postprocedural pain: Secondary | ICD-10-CM | POA: Diagnosis not present

## 2023-10-12 DIAGNOSIS — M65841 Other synovitis and tenosynovitis, right hand: Secondary | ICD-10-CM | POA: Diagnosis not present

## 2023-11-03 DIAGNOSIS — Z6841 Body Mass Index (BMI) 40.0 and over, adult: Secondary | ICD-10-CM | POA: Diagnosis not present

## 2023-11-03 DIAGNOSIS — M545 Low back pain, unspecified: Secondary | ICD-10-CM | POA: Diagnosis not present

## 2023-11-03 DIAGNOSIS — M7061 Trochanteric bursitis, right hip: Secondary | ICD-10-CM | POA: Diagnosis not present

## 2023-11-03 DIAGNOSIS — G8929 Other chronic pain: Secondary | ICD-10-CM | POA: Diagnosis not present

## 2023-11-03 DIAGNOSIS — J45909 Unspecified asthma, uncomplicated: Secondary | ICD-10-CM | POA: Diagnosis not present

## 2023-11-03 DIAGNOSIS — Z79899 Other long term (current) drug therapy: Secondary | ICD-10-CM | POA: Diagnosis not present

## 2023-11-03 DIAGNOSIS — Z885 Allergy status to narcotic agent status: Secondary | ICD-10-CM | POA: Diagnosis not present

## 2023-11-03 DIAGNOSIS — M25551 Pain in right hip: Secondary | ICD-10-CM | POA: Diagnosis not present

## 2023-11-03 DIAGNOSIS — M1990 Unspecified osteoarthritis, unspecified site: Secondary | ICD-10-CM | POA: Diagnosis not present

## 2023-11-21 ENCOUNTER — Ambulatory Visit: Payer: Self-pay | Admitting: Family Medicine

## 2023-11-21 DIAGNOSIS — G8929 Other chronic pain: Secondary | ICD-10-CM

## 2023-11-21 DIAGNOSIS — M25562 Pain in left knee: Secondary | ICD-10-CM

## 2023-11-21 MED ORDER — TRAMADOL HCL 50 MG PO TABS
50.0000 mg | ORAL_TABLET | Freq: Two times a day (BID) | ORAL | 2 refills | Status: DC | PRN
Start: 2023-11-21 — End: 2024-01-31

## 2023-11-21 NOTE — Telephone Encounter (Signed)
 Yes I can raise the quantity. 2 pills per day is reasonable. I will send new rx to her pharmacy 60 pills +2 refills.  Please notify patient that rx has been sent. Also tell her that I am unsure if insurance will cover all 60 pills. With pain medication, there are restrictions that I am not able to control. They may only cover a portion of the 60 pills, and then she will be responsible for additional cost. She can find out more at the pharmacy. But it has been ordered for more pills.  Saralyn Pilar, DO [facility] 11/21/2023, 12:57 PM

## 2023-11-21 NOTE — Telephone Encounter (Signed)
  Chief Complaint: left shoulder pain persists and almost out of medication tramadol. Has been taking 2 times daily instead of at hs  Symptoms: left shoulder pain. Took tylenol today and decreased pain.  Frequency: na  Pertinent Negatives: Patient denies sever pain no chest pain no difficulty breathing no fever no swelling in shoulder Disposition: [] ED /[] Urgent Care (no appt availability in office) / [] Appointment(In office/virtual)/ []  [place] [facility]/ [] Home Care/ [] Refused Recommended Disposition /[]  Mobile Bus/ [x]  Follow-up with PCP Additional Notes:   Patient requesting if ultram can be ordered to take 2 times a day for shoulder pain. Patient reports she only has 4 pills left. Please advise if appt needed .    Copied from CRM [id number]. Topic: Clinical - Red Word Triage >> Nov 21, 2023 11:27 AM Elle L wrote: Red Word that prompted transfer to Nurse Triage: The patient had a total shoulder replacement last year and she had a carpal tunnel surgery recently and was prescribed a controlled substance. She takes it at either 9:30 or 3:30 PM as she states that she does not want to get addicted to it but she is still experiencing severe pain. Reason for Disposition  [1] MODERATE pain (e.g., interferes with normal activities) AND [2] present > 3 days  Answer Assessment - Initial Assessment Questions 1. ONSET: "When did the pain start?"     On going since surgery  2. LOCATION: "Where is the pain located?"     Left shoulder 3. PAIN: "How bad is the pain?" (Scale 1-10; or mild, moderate, severe)   - MILD (1-3): doesn't interfere with normal activities   - MODERATE (4-7): interferes with normal activities (e.g., work or school) or awakens from sleep   - SEVERE (8-10): excruciating pain, unable to do any normal activities, unable to move arm at all due to pain     Pain level 6 after taking tylenol but continues with pain 4. WORK OR EXERCISE: "Has there been any recent work or  exercise that involved this part of the body?"     na 5. CAUSE: "What do you think is causing the shoulder pain?"     Giving out of medication 6. OTHER SYMPTOMS: "Do you have any other symptoms?" (e.g., neck pain, swelling, rash, fever, numbness, weakness)     Left shoulder pain  almost out of medication  7. PREGNANCY: "Is there any chance you are pregnant?" "When was your last menstrual period?"     na  Protocols used: Shoulder Pain-A-AH

## 2023-11-21 NOTE — Telephone Encounter (Signed)
 Patient notified prescription sent to the pharmacy also notified that insurance may or may not cover the whole 60 tablets. But the pharmacy will give her more information concerning the cost. Verbal understanding

## 2024-01-08 ENCOUNTER — Ambulatory Visit (INDEPENDENT_AMBULATORY_CARE_PROVIDER_SITE_OTHER): Admitting: Family Medicine

## 2024-01-08 ENCOUNTER — Encounter: Payer: Self-pay | Admitting: Family Medicine

## 2024-01-08 VITALS — BP 120/80 | HR 89 | Resp 18 | Ht 67.0 in | Wt 251.4 lb

## 2024-01-08 DIAGNOSIS — M5136 Other intervertebral disc degeneration, lumbar region with discogenic back pain only: Secondary | ICD-10-CM | POA: Diagnosis not present

## 2024-01-08 DIAGNOSIS — M7542 Impingement syndrome of left shoulder: Secondary | ICD-10-CM | POA: Diagnosis not present

## 2024-01-08 DIAGNOSIS — G8929 Other chronic pain: Secondary | ICD-10-CM | POA: Diagnosis not present

## 2024-01-08 DIAGNOSIS — M25562 Pain in left knee: Secondary | ICD-10-CM | POA: Diagnosis not present

## 2024-01-08 DIAGNOSIS — M545 Low back pain, unspecified: Secondary | ICD-10-CM

## 2024-01-08 NOTE — Patient Instructions (Addendum)
 Thank you for coming to the office today.  Remain OFF Cyclobenzaprine , Baclofen  and Naproxen.  Continue Tramadol  - NO NEW ORDER. Continue current bottle, 1 refills, 60 pills on Saturday 01/12/24. Contact the pharmacy on Thurs or Fri to check on status if they can refill it on Saturday. We can order more refills in future, 60 pills per bottle ONE pill twice a day max, but can reduce down to 1 a day and then take it as needed to taper down.  Please schedule a Follow-up Appointment to: Return if symptoms worsen or fail to improve.  If you have any other questions or concerns, please feel free to call the office or send a message through MyChart. You may also schedule an earlier appointment if necessary.  Additionally, you may be receiving a survey about your experience at our office within a few days to 1 week by e-mail or mail. We value your feedback.  Domingo Friend, DO [facility], [place]

## 2024-01-08 NOTE — Progress Notes (Signed)
 Subjective:    Patient ID: Megan Perez, female    DOB: November 30, 1945, 78 y.o.   MRN: [id number]  Megan Perez is a 78 y.o. female presenting on 01/08/2024 for Medication Problem   HPI  Discussed the use of AI scribe software for clinical note transcription with the patient, who gave verbal consent to proceed.  History of Present Illness   Megan Perez is a 78 year old female who presents for pain management and medication review.  Osteoarthritis multiple joints spine and knee / chronic pain  She has been experiencing issues with her tramadol  prescription, initially taking two pills twice a day instead of the prescribed one pill twice a day. She is currently using tramadol  50 mg and is attempting to reduce her usage to only when necessary due to pain. - Out of medicine currently next fill on 4/26, she is taking Tylenol  in interval. Not needing more re ordered sooner.  She experienced adverse effects from naproxen, including dizziness and a 'crazy feeling', leading her to discontinue its use. She has a history of gastrointestinal issues with ibuprofen , including spitting up blood, and was advised never to take it again. Cannot take NSAIDs oral  Previously orthopedics prescribed - Cyclobenzaprine  and baclofen  were prescribed, but she has had problems with these medications in the past and has chosen not to take them. Side effects dizziness as well.  Recent updates from [facility] She received a hip injection from Dr. Maud Sorenson for pain management, which was not effective, prompting her to seek further assistance.       09/20/2023   10:15 AM 08/24/2023    3:21 PM 10/12/2022    9:58 AM  Depression screen PHQ 2/9  Decreased Interest 1 0 0  Down, Depressed, Hopeless 0 1 0  PHQ - 2 Score 1 1 0  Altered sleeping 2 0   Tired, decreased energy 0 0   Change in appetite 0 0   Feeling bad or failure about yourself  0 0   Trouble concentrating 0 0   Moving slowly or fidgety/restless 0  0   Suicidal thoughts 0 0   PHQ-9 Score 3 1   Difficult doing work/chores  Not difficult at all        09/20/2023   10:15 AM 08/15/2022    2:26 PM 04/12/2022   11:15 AM 12/06/2021    9:46 AM  GAD 7 : Generalized Anxiety Score  Nervous, Anxious, on Edge 0 0 0 0  Control/stop worrying 1 1 1 1   Worry too much - different things 1 1 1 1   Trouble relaxing 1 1 1 1   Restless 0 1 1 1   Easily annoyed or irritable 1 0 0 0  Afraid - awful might happen 0 0 0 0  Total GAD 7 Score 4 4 4 4   Anxiety Difficulty  Not difficult at all Not difficult at all Not difficult at all    Social History   Tobacco Use   Smoking status: Never   Smokeless tobacco: Never  Vaping Use   Vaping status: Never Used  Substance Use Topics   Alcohol use: No   Drug use: No    Review of Systems Per HPI unless specifically indicated above     Objective:    BP 120/80 (BP Location: Left Arm, Patient Position: Sitting, Cuff Size: Large)   Pulse 89   Resp 18   Ht 5\' 7"  (1.702 m)   Wt 251 lb 6.4 oz (  114 kg)   SpO2 98%   BMI 39.37 kg/m   Wt Readings from Last 3 Encounters:  01/08/24 251 lb 6.4 oz (114 kg)  09/20/23 267 lb (121.1 kg)  07/09/23 257 lb (116.6 kg)    Physical Exam Vitals and nursing note reviewed.  Constitutional:      General: She is not in acute distress.    Appearance: Normal appearance. She is well-developed. She is obese. She is not diaphoretic.     Comments: Well-appearing, comfortable, cooperative  HENT:     Head: Normocephalic and atraumatic.  Eyes:     General:        Right eye: No discharge.        Left eye: No discharge.     Conjunctiva/sclera: Conjunctivae normal.  Cardiovascular:     Rate and Rhythm: Normal rate.  Pulmonary:     Effort: Pulmonary effort is normal.  Skin:    General: Skin is warm and dry.     Findings: No erythema or rash.  Neurological:     Mental Status: She is alert and oriented to person, place, and time.  Psychiatric:        Mood and Affect: Mood  normal.        Behavior: Behavior normal.        Thought Content: Thought content normal.     Comments: Well groomed, good eye contact, normal speech and thoughts     Results for orders placed or performed in visit on 11/02/22  Urine Culture   Collection Time: 11/02/22  2:43 PM   Specimen: Urine  Result Value Ref Range   MICRO NUMBER: [id number]    SPECIMEN QUALITY: Adequate    Sample Source URINE, CLEAN CATCH    STATUS: FINAL    ISOLATE 1: Proteus mirabilis (A)       Susceptibility   Proteus mirabilis - URINE CULTURE, REFLEX    AMOX/CLAVULANIC <=2 Sensitive     AMPICILLIN <=2 Sensitive     AMPICILLIN/SULBACTAM <=2 Sensitive     CEFAZOLIN * <=4 Not Reportable      * For infections other than uncomplicated UTI caused by E. coli, K. pneumoniae or P. mirabilis: Cefazolin  is resistant if MIC > or = 8 mcg/mL. (Distinguishing susceptible versus intermediate for isolates with MIC < or = 4 mcg/mL requires additional testing.) For uncomplicated UTI caused by E. coli, K. pneumoniae or P. mirabilis: Cefazolin  is susceptible if MIC <32 mcg/mL and predicts susceptible to the oral agents cefaclor, cefdinir, cefpodoxime, cefprozil, cefuroxime, cephalexin and loracarbef.     CEFTAZIDIME <=1 Sensitive     CEFEPIME <=1 Sensitive     CEFTRIAXONE <=1 Sensitive     CIPROFLOXACIN <=0.25 Sensitive     LEVOFLOXACIN  <=0.12 Sensitive     GENTAMICIN <=1 Sensitive     IMIPENEM 2 Intermediate     NITROFURANTOIN  128 Resistant     PIP/TAZO <=4 Sensitive     TOBRAMYCIN <=1 Sensitive     TRIMETH /SULFA * <=20 Sensitive      * For infections other than uncomplicated UTI caused by E. coli, K. pneumoniae or P. mirabilis: Cefazolin  is resistant if MIC > or = 8 mcg/mL. (Distinguishing susceptible versus intermediate for isolates with MIC < or = 4 mcg/mL requires additional testing.) For uncomplicated UTI caused by E. coli, K. pneumoniae or P. mirabilis: Cefazolin  is susceptible if MIC <32 mcg/mL and  predicts susceptible to the oral agents cefaclor, cefdinir, cefpodoxime, cefprozil, cefuroxime, cephalexin and loracarbef. Legend: S = Susceptible  I =  Intermediate R = Resistant  NS = Not susceptible * = Not tested  NR = Not reported **NN = See antimicrobic comments   POCT urinalysis dipstick   Collection Time: 11/02/22  3:20 PM  Result Value Ref Range   Color, UA     Clarity, UA     Glucose, UA Negative Negative   Bilirubin, UA Negative    Ketones, UA Negative    Spec Grav, UA <=1.005 (A) 1.010 - 1.025   Blood, UA Trace    pH, UA 8.5 (A) 5.0 - 8.0   Protein, UA Negative Negative   Urobilinogen, UA 0.2 0.2 or 1.0 E.U./dL   Nitrite, UA Negative    Leukocytes, UA Small (1+) (A) Negative   Appearance     Odor        Assessment & Plan:   Problem List Items Addressed This Visit     DDD (degenerative disc disease), lumbar (Chronic)   Other Visit Diagnoses       Chronic pain of left knee    -  Primary     Rotator cuff impingement syndrome of left shoulder         Chronic midline low back pain without sciatica          Intolerance to oral NSAID  Adverse reaction to naproxen, side effects and cannot tolerate ibuprofen  with history of GERD PUD - Discontinue naproxen (note this was ordered by Orthopedics recently) - AVOID oral NSAIDs - Discontinue Cyclobenzapine and prior Baclofen  with side effects on muscle relaxants, dizzy and intolerance.  Osteoarthritis multiple joints Lumbar DDD Chronic Back Pain / Knee pain and arthritis Chronic pain management Improved control on Tramadol  50 mg effective for pain. Prefers reduced usage, taking only as needed. However she misunderstood instructions and did take 2 tab twice a day for period of time and ran out. - Continue tramadol  50 mg, one tablet twice a day as needed. - Refill available on upcoming Saturday 4/26 with 60 pills. Discussed she plans to take lower amount over time in future if not needed    No orders of the defined  types were placed in this encounter.   No orders of the defined types were placed in this encounter.   Follow up plan: Return if symptoms worsen or fail to improve.   Domingo Friend, DO [facility] 01/08/2024, 9:57 AM

## 2024-01-30 ENCOUNTER — Other Ambulatory Visit: Payer: Self-pay | Admitting: Family Medicine

## 2024-01-30 DIAGNOSIS — M25462 Effusion, left knee: Secondary | ICD-10-CM

## 2024-01-30 DIAGNOSIS — G8929 Other chronic pain: Secondary | ICD-10-CM

## 2024-01-31 ENCOUNTER — Other Ambulatory Visit: Payer: Self-pay | Admitting: Family Medicine

## 2024-01-31 DIAGNOSIS — M25462 Effusion, left knee: Secondary | ICD-10-CM

## 2024-01-31 DIAGNOSIS — G8929 Other chronic pain: Secondary | ICD-10-CM

## 2024-01-31 MED ORDER — TRAMADOL HCL 50 MG PO TABS
50.0000 mg | ORAL_TABLET | Freq: Two times a day (BID) | ORAL | 2 refills | Status: DC | PRN
Start: 2024-01-31 — End: 2024-03-20

## 2024-01-31 NOTE — Telephone Encounter (Signed)
 Requested medications are due for refill today.  A little too soon  Requested medications are on the active medications list.  yes  Last refill. 11/21/2023 #60 2 rf  Future visit scheduled.   yes  Notes to clinic.  Refill not delegated.    Requested Prescriptions  Pending Prescriptions Disp Refills   traMADol  (ULTRAM ) 50 MG tablet [Pharmacy Med Name: traMADol  HCl Oral Tablet 50 MG] 30 tablet     Sig: TAKE 1 TABLET AT BEDTIME AS NEEDED FOR MODERATE PAIN.     Not Delegated - Analgesics:  Opioid Agonists Failed - 01/31/2024  3:07 PM      Failed - This refill cannot be delegated      Failed - Urine Drug Screen completed in last 360 days      Failed - Valid encounter within last 3 months    Recent Outpatient Visits           3 weeks ago Chronic pain of left knee   [facility] [place], [place], [place]

## 2024-02-04 ENCOUNTER — Other Ambulatory Visit: Payer: Self-pay | Admitting: Family Medicine

## 2024-02-04 DIAGNOSIS — G8929 Other chronic pain: Secondary | ICD-10-CM

## 2024-02-04 DIAGNOSIS — M25462 Effusion, left knee: Secondary | ICD-10-CM

## 2024-02-05 ENCOUNTER — Encounter (INDEPENDENT_AMBULATORY_CARE_PROVIDER_SITE_OTHER): Payer: Self-pay

## 2024-02-06 NOTE — Telephone Encounter (Signed)
 Requested medication (s) are due for refill today:   Provider to review  Requested medication (s) are on the active medication list:   Yes  Future visit scheduled:   Yes 12/12 for AWV    LOV 01/08/2024   Last ordered: 01/31/2024 #60, 2 refills  Non delegated refill    Requested Prescriptions  Pending Prescriptions Disp Refills   traMADol  (ULTRAM ) 50 MG tablet [Pharmacy Med Name: traMADol  HCl Oral Tablet 50 MG] 30 tablet     Sig: TAKE 1 TABLET AT BEDTIME AS NEEDED FOR MODERATE PAIN.     Not Delegated - Analgesics:  Opioid Agonists Failed - 02/06/2024 12:16 PM      Failed - This refill cannot be delegated      Failed - Urine Drug Screen completed in last 360 days      Failed - Valid encounter within last 3 months    Recent Outpatient Visits           4 weeks ago Chronic pain of left knee   [facility] [place], [place], [place]

## 2024-02-06 NOTE — Telephone Encounter (Signed)
 Rx was ordered 1 week ago. I will decline this request again.

## 2024-03-20 ENCOUNTER — Encounter: Payer: Self-pay | Admitting: Family Medicine

## 2024-03-20 ENCOUNTER — Ambulatory Visit (INDEPENDENT_AMBULATORY_CARE_PROVIDER_SITE_OTHER): Admitting: Family Medicine

## 2024-03-20 VITALS — BP 130/70 | HR 80 | Ht 67.0 in | Wt 252.1 lb

## 2024-03-20 DIAGNOSIS — G8929 Other chronic pain: Secondary | ICD-10-CM

## 2024-03-20 DIAGNOSIS — N3281 Overactive bladder: Secondary | ICD-10-CM | POA: Diagnosis not present

## 2024-03-20 DIAGNOSIS — N3941 Urge incontinence: Secondary | ICD-10-CM | POA: Diagnosis not present

## 2024-03-20 DIAGNOSIS — R300 Dysuria: Secondary | ICD-10-CM | POA: Diagnosis not present

## 2024-03-20 DIAGNOSIS — M7061 Trochanteric bursitis, right hip: Secondary | ICD-10-CM

## 2024-03-20 DIAGNOSIS — M25562 Pain in left knee: Secondary | ICD-10-CM

## 2024-03-20 DIAGNOSIS — M25462 Effusion, left knee: Secondary | ICD-10-CM

## 2024-03-20 LAB — POCT URINALYSIS DIPSTICK
Blood, UA: NEGATIVE
Glucose, UA: NEGATIVE
Ketones, UA: NEGATIVE
Leukocytes, UA: NEGATIVE
Nitrite, UA: NEGATIVE
Odor: POSITIVE
Protein, UA: NEGATIVE
Spec Grav, UA: 1.02 (ref 1.010–1.025)
Urobilinogen, UA: 0.2 U/dL
pH, UA: 5 (ref 5.0–8.0)

## 2024-03-20 MED ORDER — OXYBUTYNIN CHLORIDE ER 5 MG PO TB24
5.0000 mg | ORAL_TABLET | Freq: Every day | ORAL | 0 refills | Status: DC
Start: 2024-03-20 — End: 2024-04-17

## 2024-03-20 MED ORDER — DICLOFENAC SODIUM 1 % EX GEL: 2.0000 g | Freq: Four times a day (QID) | CUTANEOUS | 2 refills | Status: AC | PRN

## 2024-03-20 MED ORDER — PREDNISONE 20 MG PO TABS: ORAL_TABLET | ORAL | 0 refills | Status: DC

## 2024-03-20 NOTE — Progress Notes (Signed)
 Subjective:    Patient ID: Megan Perez, female    DOB: Jan 09, 1946, 78 y.o.   MRN: [id number]  Megan Perez is a 78 y.o. female presenting on 03/20/2024 for Urinary Frequency (OAB, Incontinence Urge) and Hip Pain  Patient presents for a same day appointment.   HPI  Discussed the use of AI scribe software for clinical note transcription with the patient, who gave verbal consent to proceed.  History of Present Illness   Megan Perez is a 78 year old female with lymphedema and hip bursitis who presents with urinary frequency and hip pain.  Urinary frequency and nocturia / Urinary Urge Incontinence - Urinary frequency, voiding approximately every hour - Nocturia, with symptoms particularly disruptive at night and affecting sleep - No dysuria or burning with urination currently - No stress incontinence  R Hip pain - Significant pain localized to the hip joint - Pain has progressively worsened over time - Currently managing pain with Tylenol  - History of tapering off Tramadol  now recently last pill >4 days ago. She has self discontinued. - Previous use of diclofenac  cream provided temporary relief - Upcoming appointment for steroid injection in the hip with Orthopedics 7/15 - Inquires about the possibility of using oral steroids instead of injection due to dislike of shots  Lower extremity numbness and lymphedema - Numbness in the feet, associated with known lymphedema - Previously used furosemide , discontinued due to excessive urination - Not currently using a lymphedema pump - Has not visited a vascular specialist recently          09/20/2023   10:15 AM 08/24/2023    3:21 PM 10/12/2022    9:58 AM  Depression screen PHQ 2/9  Decreased Interest 1 0 0  Down, Depressed, Hopeless 0 1 0  PHQ - 2 Score 1 1 0  Altered sleeping 2 0   Tired, decreased energy 0 0   Change in appetite 0 0   Feeling bad or failure about yourself  0 0   Trouble concentrating 0 0   Moving slowly or  fidgety/restless 0 0   Suicidal thoughts 0 0   PHQ-9 Score 3 1   Difficult doing work/chores  Not difficult at all        09/20/2023   10:15 AM 08/15/2022    2:26 PM 04/12/2022   11:15 AM 12/06/2021    9:46 AM  GAD 7 : Generalized Anxiety Score  Nervous, Anxious, on Edge 0 0 0 0  Control/stop worrying 1 1 1 1   Worry too much - different things 1 1 1 1   Trouble relaxing 1 1 1 1   Restless 0 1 1 1   Easily annoyed or irritable 1 0 0 0  Afraid - awful might happen 0 0 0 0  Total GAD 7 Score 4 4 4 4   Anxiety Difficulty  Not difficult at all Not difficult at all Not difficult at all    Social History   Tobacco Use   Smoking status: Never   Smokeless tobacco: Never  Vaping Use   Vaping status: Never Used  Substance Use Topics   Alcohol use: No   Drug use: No    Review of Systems Per HPI unless specifically indicated above     Objective:    BP 130/70 (BP Location: Left Arm, Patient Position: Sitting, Cuff Size: Large)   Pulse 80   Ht 5' 7 (1.702 m)   Wt 252 lb 2 oz (114.4 kg)   SpO2 96%  BMI 39.49 kg/m   Wt Readings from Last 3 Encounters:  03/20/24 252 lb 2 oz (114.4 kg)  01/08/24 251 lb 6.4 oz (114 kg)  09/20/23 267 lb (121.1 kg)    Physical Exam Vitals and nursing note reviewed.  Constitutional:      General: She is not in acute distress.    Appearance: Normal appearance. She is well-developed. She is obese. She is not diaphoretic.     Comments: Well-appearing, comfortable, cooperative  HENT:     Head: Normocephalic and atraumatic.  Eyes:     General:        Right eye: No discharge.        Left eye: No discharge.     Conjunctiva/sclera: Conjunctivae normal.  Cardiovascular:     Rate and Rhythm: Normal rate.  Pulmonary:     Effort: Pulmonary effort is normal.  Musculoskeletal:     Right lower leg: Edema (+1-2 stable lymphedema) present.     Left lower leg: Edema present.     Comments: R hip pain over trochanteric bursa, some reduced range of motion due  to pain.  Skin:    General: Skin is warm and dry.     Findings: No erythema or rash.  Neurological:     Mental Status: She is alert and oriented to person, place, and time.  Psychiatric:        Mood and Affect: Mood normal.        Behavior: Behavior normal.        Thought Content: Thought content normal.     Comments: Well groomed, good eye contact, normal speech and thoughts     Results for orders placed or performed in visit on 03/20/24  POCT Urinalysis Dipstick   Collection Time: 03/20/24 11:02 AM  Result Value Ref Range   Color, UA yellow    Clarity, UA clear    Glucose, UA Negative Negative   Bilirubin, UA small    Ketones, UA negative    Spec Grav, UA 1.020 1.010 - 1.025   Blood, UA negative    pH, UA 5.0 5.0 - 8.0   Protein, UA Negative Negative   Urobilinogen, UA 0.2 0.2 or 1.0 E.U./dL   Nitrite, UA negative    Leukocytes, UA Negative Negative   Appearance     Odor positive       Assessment & Plan:   Problem List Items Addressed This Visit   None Visit Diagnoses       Greater trochanteric bursitis of right hip    -  Primary   Relevant Medications   predniSONE  (DELTASONE ) 20 MG tablet     Dysuria       Relevant Orders   POCT Urinalysis Dipstick (Completed)     Urge urinary incontinence       Relevant Medications   oxybutynin (DITROPAN-XL) 5 MG 24 hr tablet     OAB (overactive bladder)         Pain and swelling of left knee         Chronic pain of left knee       Relevant Medications   predniSONE  (DELTASONE ) 20 MG tablet   diclofenac  Sodium (VOLTAREN  ARTHRITIS PAIN) 1 % GEL        R Hip Bursitis Significant hip joint pain due to bursitis. Prefers to avoid oral medications. Injection expected to provide longer relief scheduled w/ Ortho on 7/15, however she has had prednisone  in past for burst and thinks she may need again  in future.  - Keep Ortho R Hip injection for bursitis on 7/15 - Prescribe oral steroids if pain becomes unbearable before  injection. - PRINTED Rx as back up plan only to fill if needed, advised she may not meet criteria for hip injection on 7/15 if she takes the oral steroid - Discussed risks and benefits of oral steroids, including increased blood glucose and blood pressure.  Overactive Bladder Urinary frequency and urgency without infection symptoms. UA dipstick today negative. Symptoms affect sleep and daily activities. Oxybutynin discussed as treatment option, caution with side effects. - Prescribe oxybutynin XL 5 mg in the evening. - Discussed potential side effects, advised taking at bedtime to minimize effects. - Future consider Myrbetriq if covered or affordable. Vs Urology referral  Lymphedema Numbness in feet possibly due to fluid retention causing nerve compression. Previously used lymphedema pump. Followed by Vascular, last visit 2024 - Consider lymphedema pump for fluid management. - Discuss consulting vascular specialist for circulation. - Offer nerve supplement for numbness.  General Health Maintenance Inquired about diclofenac  cream for pain management, confirmed availability over the counter. - Refill diclofenac  cream prescription.        Orders Placed This Encounter  Procedures   POCT Urinalysis Dipstick    Meds ordered this encounter  Medications   predniSONE  (DELTASONE ) 20 MG tablet    Sig: Take daily with food. Start with 60mg  (3 pills) x 2 days, then reduce to 40mg  (2 pills) x 2 days, then 20mg  (1 pill) x 3 days    Dispense:  13 tablet    Refill:  0   diclofenac  Sodium (VOLTAREN  ARTHRITIS PAIN) 1 % GEL    Sig: Apply 2 g topically 4 (four) times daily as needed (knee).    Dispense:  100 g    Refill:  2   oxybutynin (DITROPAN-XL) 5 MG 24 hr tablet    Sig: Take 1 tablet (5 mg total) by mouth at bedtime. For overactive bladder    Dispense:  30 tablet    Refill:  0    Follow up plan: Return if symptoms worsen or fail to improve.   Marsa Officer, DO [facility] 03/20/2024, 11:17 AM

## 2024-03-20 NOTE — Patient Instructions (Addendum)
 Thank you for coming to the office today.  START anti inflammatory topical - OTC Voltaren  (generic Diclofenac ) topical 2-4 times a day as needed for pain swelling of affected joint for 1-2 weeks or longer.  Rx Prednisone  ONLY AS BACK UP PLAN. If you cannot manage the pain, okay to take in future if need.  If you plan to keep with Orthopedic injection into hip on 7/15. That can last longer.  Oxybutynin XL 5mg  nightly at bedtime to help reduce urinary urge and incontinence. This is for overactive bladder and urgency frequency. We can adjust dose if needed.  Please schedule a Follow-up Appointment to: Return if symptoms worsen or fail to improve.  If you have any other questions or concerns, please feel free to call the office or send a message through MyChart. You may also schedule an earlier appointment if necessary.  Additionally, you may be receiving a survey about your experience at our office within a few days to 1 week by e-mail or mail. We value your feedback.  Marsa Officer, DO [facility], [place]

## 2024-03-24 ENCOUNTER — Ambulatory Visit: Admitting: Family Medicine

## 2024-04-14 ENCOUNTER — Other Ambulatory Visit: Payer: Self-pay | Admitting: Family Medicine

## 2024-04-14 DIAGNOSIS — Z1231 Encounter for screening mammogram for malignant neoplasm of breast: Secondary | ICD-10-CM

## 2024-04-17 ENCOUNTER — Other Ambulatory Visit: Payer: Self-pay | Admitting: Family Medicine

## 2024-04-17 DIAGNOSIS — N3941 Urge incontinence: Secondary | ICD-10-CM

## 2024-04-17 NOTE — Telephone Encounter (Unsigned)
 Copied from CRM [id number]. Topic: Clinical - Medication Refill >> Apr 17, 2024 11:56 AM Wess RAMAN wrote: Medication: oxybutynin  (DITROPAN -XL) 5 MG 24 hr tablet   Has the patient contacted their pharmacy? No (Agent: If no, request that the patient contact the pharmacy for the refill. If patient does not wish to contact the pharmacy document the reason why and proceed with request.) (Agent: If yes, when and what did the pharmacy advise?)  This is the patient's preferred pharmacy:  [id number] [place],  - [address] AT [facility] [address] [place] [address] [place] [address] Phone: [phone] Fax: [phone]  Is this the correct pharmacy for this prescription? Yes If no, delete pharmacy and type the correct one.   Has the prescription been filled recently? Yes  Is the patient out of the medication? Yes  Has the patient been seen for an appointment in the last year OR does the patient have an upcoming appointment? Yes  Can we respond through MyChart? Yes  Agent: Please be advised that Rx refills may take up to 3 business days. We ask that you follow-up with your pharmacy.

## 2024-04-18 MED ORDER — OXYBUTYNIN CHLORIDE ER 5 MG PO TB24
5.0000 mg | ORAL_TABLET | Freq: Every day | ORAL | 0 refills | Status: DC
Start: 2024-04-18 — End: 2024-04-24

## 2024-04-18 NOTE — Telephone Encounter (Signed)
 Requested Prescriptions  Pending Prescriptions Disp Refills   oxybutynin  (DITROPAN -XL) 5 MG 24 hr tablet 90 tablet 0    Sig: Take 1 tablet (5 mg total) by mouth at bedtime. For overactive bladder     Urology:  Bladder Agents Passed - 04/18/2024 11:01 AM      Passed - Valid encounter within last 12 months    Recent Outpatient Visits           4 weeks ago Greater trochanteric bursitis of right hip   [facility] [place], Marsa PARAS, DO   3 months ago Chronic pain of left knee   [facility] [place], Marsa PARAS, [place]

## 2024-04-23 ENCOUNTER — Ambulatory Visit: Payer: Self-pay | Admitting: *Deleted

## 2024-04-23 ENCOUNTER — Telehealth: Payer: Self-pay

## 2024-04-23 NOTE — Telephone Encounter (Signed)
 FYI Only or Action Required?: FYI only for provider.  Patient was last seen in primary care on 03/20/2024 by Edman Marsa PARAS, DO.  Called Nurse Triage reporting Dizziness.  Symptoms began about a month ago.  Interventions attempted: Rest, hydration, or home remedies.  Symptoms are: gradually worsening.  Triage Disposition: See Physician Within 24 Hours  Patient/caregiver understands and will follow disposition?: Yes                Copied from CRM #[id number]. Topic: Clinical - Red Word Triage >> Apr 23, 2024 11:15 AM [name] wrote: Red Word that prompted transfer to Nurse Triage:   Patient states she has been having issues with her bladder being overactive and has been feeling lightheaded and dehydrated because of this. Patient is wanting to know if she can increase her oxybutynin  (DITROPAN -XL) 5 MG 24 hr tablet due to the dose prescribed not doing anything for her Reason for Disposition  [1] MODERATE dizziness (e.g., interferes with normal activities) AND [2] has NOT been evaluated by doctor (or NP/PA) for this  (Exception: Dizziness caused by heat exposure, sudden standing, or poor fluid intake.)  Answer Assessment - Initial Assessment Questions Appt scheduled for tomorrow. Requesting patient to check BP and patient reports she feels her monitor is not working correctly . Patient reports feeling lightheaded x 1 month and has not taken ditropan  in 2 weeks. Patient ran out of medication  and just refilled today. Reports receiving steroid shot in hip and taking prednisone  2 weeks ago and developed swelling in ankles  and numbness. Swelling decreased now. Recommended if sx of dizziness worsen go to UC/ED.       SABRA DESCRIPTION: Describe your dizziness.     lightheadedness 2. LIGHTHEADED: Do you feel lightheaded? (e.g., somewhat faint, woozy, weak upon standing)     Woozy , weak upon standing .  3. VERTIGO: Do you feel like either you or the room is spinning  or tilting? (i.e., vertigo)     na 4. SEVERITY: How bad is it?  Do you feel like you are going to faint? Can you stand and walk?     Standing and has to sit back down quickly at times due to feeling lightheaded 5. ONSET:  When did the dizziness begin?    Approx 1 month ago  6. AGGRAVATING FACTORS: Does anything make it worse? (e.g., standing, change in head position)     Standing  7. HEART RATE: Can you tell me your heart rate? How many beats in 15 seconds?  (Note: Not all patients can do this.)       na 8. CAUSE: What do you think is causing the dizziness? (e.g., decreased fluids or food, diarrhea, emotional distress, heat exposure, new medicine, sudden standing, vomiting; unknown)     Possible medication per patient . Increased urination or low BP  9. RECURRENT SYMPTOM: Have you had dizziness before? If Yes, ask: When was the last time? What happened that time?     No  10. OTHER SYMPTOMS: Do you have any other symptoms? (e.g., fever, chest pain, vomiting, diarrhea, bleeding)       Diarrhea x 1 after eating meal on Sunday and  has been lightheaded x 1 month and since starting ditropan  drinking 16 oz bottle of water , increased urination at night. Still c/o overactive bladder.  11. PREGNANCY: Is there any chance you are pregnant? When was your last menstrual period?       na  Protocols  used: Dizziness - Lightheadedness-A-AH

## 2024-04-23 NOTE — Telephone Encounter (Signed)
 Copied from CRM #[id number]. Topic: Clinical - Medication Question >> Apr 23, 2024 11:17 AM [name] wrote: Reason for CRM: Patient is wanting to know if she can increase her oxybutynin  (DITROPAN -XL) 5 MG 24 hr tablet due to the dose prescribed not doing anything for her. Please advise # [phone]   **Patient states she has been feeling lightheaded & dehydrated and believes it is due to her overactive bladder. Did get her connected with NT for this . TY

## 2024-04-23 NOTE — Telephone Encounter (Signed)
 Keep apt tomorrow 8/7

## 2024-04-24 ENCOUNTER — Encounter: Payer: Self-pay | Admitting: Family Medicine

## 2024-04-24 ENCOUNTER — Ambulatory Visit (INDEPENDENT_AMBULATORY_CARE_PROVIDER_SITE_OTHER): Admitting: Family Medicine

## 2024-04-24 VITALS — BP 120/74 | HR 85 | Ht 67.0 in | Wt 253.4 lb

## 2024-04-24 DIAGNOSIS — R350 Frequency of micturition: Secondary | ICD-10-CM

## 2024-04-24 DIAGNOSIS — N3281 Overactive bladder: Secondary | ICD-10-CM

## 2024-04-24 DIAGNOSIS — H8110 Benign paroxysmal vertigo, unspecified ear: Secondary | ICD-10-CM

## 2024-04-24 NOTE — Progress Notes (Signed)
 Subjective:    Patient ID: Megan Perez, female    DOB: [birth year], 78 y.o.   MRN: [id number]  Megan Perez is a 78 y.o. female presenting on 04/24/2024 for Dizziness (Dizziness for about 1 month; frequent urination about 1 month)   HPI  Discussed the use of AI scribe software for clinical note transcription with the patient, who gave verbal consent to proceed.  History of Present Illness   Megan Perez is a 78 year old female who presents with dizziness and frequent urination. She is accompanied by her niece, who is a Engineer, civil (consulting).  Vertigo and gait disturbance - Dizziness occurs frequently with postural change and standing up described as a sensation of the room spinning - Symptoms have persisted for approximately >1 year - Home exercises to reset the inner ear provided only temporary relief - Meclizine  previously trialed but not tolerated due to feeling 'loopy' - Tends to veer to the left when walking, as observed by her niece - History of a previous fall - Occasionally drags her leg, attributed to coordination issues and likely neurological dysfunction, as chronic problem  Urinary frequency and nocturia - Frequent urination with urgency every 45 minutes, including at night, disrupting sleep - Trialed medication for urinary frequency: one pill daily for two weeks, then two pills at night for another two weeks, without symptom improvement - Urine is clear with no dysuria or irritation - Urine culture one year ago showed some findings; recent urine dipstick was normal - Discontinued medication due to lack of efficacy and cost concerns, despite having a 90-day supply - Hesitant to try more expensive medications due to fixed income         09/20/2023   10:15 AM 08/24/2023    3:21 PM 10/12/2022    9:58 AM  Depression screen PHQ 2/9  Decreased Interest 1 0 0  Down, Depressed, Hopeless 0 1 0  PHQ - 2 Score 1 1 0  Altered sleeping 2 0   Tired, decreased energy 0 0   Change in appetite 0  0   Feeling bad or failure about yourself  0 0   Trouble concentrating 0 0   Moving slowly or fidgety/restless 0 0   Suicidal thoughts 0 0   PHQ-9 Score 3 1   Difficult doing work/chores  Not difficult at all        09/20/2023   10:15 AM 08/15/2022    2:26 PM 04/12/2022   11:15 AM 12/06/2021    9:46 AM  GAD 7 : Generalized Anxiety Score  Nervous, Anxious, on Edge 0 0 0 0  Control/stop worrying 1 1 1 1   Worry too much - different things 1 1 1 1   Trouble relaxing 1 1 1 1   Restless 0 1 1 1   Easily annoyed or irritable 1 0 0 0  Afraid - awful might happen 0 0 0 0  Total GAD 7 Score 4 4 4 4   Anxiety Difficulty  Not difficult at all Not difficult at all Not difficult at all    Social History   Tobacco Use   Smoking status: Never   Smokeless tobacco: Never  Vaping Use   Vaping status: Never Used  Substance Use Topics   Alcohol use: No   Drug use: No    Review of Systems Per HPI unless specifically indicated above     Objective:    BP 120/74 (BP Location: Right Arm, Patient Position: Sitting, Cuff Size: Large)   Pulse  85   Ht 5' 7 (1.702 m)   Wt 253 lb 6 oz (114.9 kg)   SpO2 97%   BMI 39.68 kg/m   Wt Readings from Last 3 Encounters:  04/24/24 253 lb 6 oz (114.9 kg)  03/20/24 252 lb 2 oz (114.4 kg)  01/08/24 251 lb 6.4 oz (114 kg)    Physical Exam Vitals and nursing note reviewed.  Constitutional:      General: She is not in acute distress.    Appearance: Normal appearance. She is well-developed. She is not diaphoretic.     Comments: Well-appearing, comfortable, cooperative  HENT:     Head: Normocephalic and atraumatic.  Eyes:     General:        Right eye: No discharge.        Left eye: No discharge.     Conjunctiva/sclera: Conjunctivae normal.  Cardiovascular:     Rate and Rhythm: Normal rate.  Pulmonary:     Effort: Pulmonary effort is normal.  Skin:    General: Skin is warm and dry.     Findings: No erythema or rash.  Neurological:     Mental  Status: She is alert and oriented to person, place, and time.  Psychiatric:        Mood and Affect: Mood normal.        Behavior: Behavior normal.        Thought Content: Thought content normal.     Comments: Well groomed, good eye contact, normal speech and thoughts     Results for orders placed or performed in visit on 03/20/24  POCT Urinalysis Dipstick   Collection Time: 03/20/24 11:02 AM  Result Value Ref Range   Color, UA yellow    Clarity, UA clear    Glucose, UA Negative Negative   Bilirubin, UA small    Ketones, UA negative    Spec Grav, UA 1.020 1.010 - 1.025   Blood, UA negative    pH, UA 5.0 5.0 - 8.0   Protein, UA Negative Negative   Urobilinogen, UA 0.2 0.2 or 1.0 E.U./dL   Nitrite, UA negative    Leukocytes, UA Negative Negative   Appearance     Odor positive       Assessment & Plan:   Problem List Items Addressed This Visit   None Visit Diagnoses       OAB (overactive bladder)    -  Primary   Relevant Orders   Ambulatory referral to Urology     Benign paroxysmal positional vertigo, unspecified laterality         Urinary frequency       Relevant Orders   Ambulatory referral to Urology        Overactive bladder with urinary frequency and nocturia Persistent urinary frequency and nocturia despite ineffective oxybutynin . No infection or dysuria. Urine clear. Differential includes incomplete bladder emptying or overactive bladder.   - Refer to urology at [facility] for further evaluation and management. Likkely need PVR and maybe urodynamics - Discontinue oxybutynin  XL, inadequate results. Consider Myrbetriq but cost is high - Discuss alternative treatments with urologist.  Benign paroxysmal vertigo Recurrent vertigo upon standing or changing positions, suggestive of inner ear involvement. Meclizine  not tolerated. Balance issues with left-sided drift, possibly related to inner ear dysfunction. - Provide Epley Maneuver instructions for home  exercises. - Advise continuation of exercises on both sides for longer duration. - Consider referral to therapist for vestibular rehabilitation if symptoms persist.  Gait disturbance with left-sided  drift Gait disturbance with left-sided drift, possibly due to inner ear involvement affecting balance. - Advise continuation of balance exercises. - Consider referral to therapist for balance therapy if symptoms persist.        Orders Placed This Encounter  Procedures   Ambulatory referral to Urology    Referral Priority:   Routine    Referral Type:   Consultation    Referral Reason:   Specialty Services Required    Requested Specialty:   Urology    Number of Visits Requested:   1    No orders of the defined types were placed in this encounter.   Follow up plan: Return if symptoms worsen or fail to improve.    Marsa Officer, DO [facility] 04/24/2024, 10:50 AM

## 2024-04-24 NOTE — Patient Instructions (Addendum)
 Thank you for coming to the office today.  Referral to Urology. To evaluate Overactive bladder, possibly bladder may not be emptying entirely.  Stop taking the Oxybutynin .  [facility] Address: [address] [place], [place] [address] Phone: [phone]  Vertigo (Benign Paroxysmal Positional Vertigo) - This is commonly caused by inner ear fluid imbalance, sometimes can be worsened by allergies and sinus symptoms, otherwise it can occur randomly sometimes and we may never discover the exact cause. - To treat this, try the Epley Manuever (see diagrams/instructions below) at home up to 3 times a day for 1-2 weeks or until symptoms resolve - You may take Meclizine  as needed up to 3 times a day for dizziness, this will not cure symptoms but may help. Caution may make you drowsy.  If you develop significant worsening episode with vertigo that does not improve and you get severe headache, loss of vision, arm or leg weakness, slurred speech, or other concerning symptoms please seek immediate medical attention at Emergency Department.  Regarding your concern about walking to one side - it may be more of a balance disorder related to the inner ear. If it does not resolve with the home exercises, call back and we can refer to Balance / Vestibular Therapist.  Please schedule a follow-up appointment with Dr Edman within 4 weeks if Vertigo not improving, and will consider Referral to Vestibular Rehab  See the next page for images describing the Epley Manuever.     ----------------------------------------------------------------------------------------------------------------------       Please schedule a Follow-up Appointment to: Return if symptoms worsen or fail to improve.  If you have any other questions or concerns, please feel free to call the office or send a message through MyChart. You may also schedule an earlier appointment if  necessary.  Additionally, you may be receiving a survey about your experience at our office within a few days to 1 week by e-mail or mail. We value your feedback.  Marsa Edman, DO [facility], [place]

## 2024-05-07 ENCOUNTER — Ambulatory Visit: Payer: Self-pay

## 2024-05-07 NOTE — Telephone Encounter (Signed)
 There may be some misunderstanding but I will do my best to help. I did suggest that we could offer referral to therapist for vestibular / balance treatment for her vertigo. Due to the nature of the treatments, it often requires their specialized equipment or area to perform the treatments. I am not aware of any vestibular therapist that does Home Health or home visits.  I would suggest outpatient referral to physical therapy / vestibular therapy - usually it is at [facility] for the main outpatient therapy location.  There is also a [place] location that may offer this.  Would she agree to referral to go to the therapist for dizziness / vertigo?  Marsa Officer, DO [facility] 05/07/2024, 7:09 PM

## 2024-05-07 NOTE — Telephone Encounter (Signed)
 FYI Only or Action Required?: Action required by provider: clinical question for provider.  Patient was last seen in primary care on 04/24/2024 by Edman Marsa PARAS, DO.  Called Nurse Triage reporting Dizziness.  Symptoms began several weeks ago.  Interventions attempted: Rest, hydration, or home remedies.  Symptoms are: unchanged.  Triage Disposition: See PCP Within 2 Weeks  Patient/caregiver understands and will follow disposition?: No, wishes to speak with PCP  Copied from CRM #[id number]. Topic: Clinical - Red Word Triage >> May 07, 2024  2:42 PM Larissa S wrote: [facility] that prompted transfer to Nurse Triage: dizziness- worsening Reason for Disposition  [1] MILD dizziness (e.g., vertigo; walking normally) AND [2] has been evaluated by doctor (or NP/PA) for this  Answer Assessment - Initial Assessment Questions 1. DESCRIPTION: Describe your dizziness.     Pt states that she was seen by pcp a few weeks ago and given some charts to do PT for her dizziness 2. VERTIGO: Do you feel like either you or the room is spinning or tilting?      States that her head spins, after she stands up, she states that it passes  eventually 3. LIGHTHEADED: Do you feel lightheaded? (e.g., somewhat faint, woozy, weak upon standing)     denies 4. SEVERITY: How bad is it?  Can you walk?     Can walk 5. ONSET:  When did the dizziness begin?     Weeks ago, worsening 6. AGGRAVATING FACTORS: Does anything make it worse? (e.g., standing, change in head position)     standing 7. CAUSE: What do you think is causing the dizziness?     Was dx with vertigo 9. OTHER SYMPTOMS: Do you have any other symptoms? (e.g., earache, headache, numbness, tinnitus, vomiting, weakness)     Denies  Pt states that at her visit for this her PCP had mentioned that he could get a [facility] caregiver that could come to her house to do Epley maneuvers. Pt states that she has been doing the exercises but states it is  not helping.  Protocols used: Dizziness - Vertigo-A-AH

## 2024-05-08 NOTE — Telephone Encounter (Signed)
 Spoke to patient, she is not agreeable to therapy. Stated she does not have the money for that right now.

## 2024-05-12 DIAGNOSIS — N390 Urinary tract infection, site not specified: Secondary | ICD-10-CM | POA: Diagnosis not present

## 2024-05-12 DIAGNOSIS — R350 Frequency of micturition: Secondary | ICD-10-CM | POA: Diagnosis not present

## 2024-05-13 ENCOUNTER — Ambulatory Visit
Admission: RE | Admit: 2024-05-13 | Discharge: 2024-05-13 | Disposition: A | Discharge status: Home / Self Care | Source: Ambulatory Visit | Attending: Family Medicine | Admitting: Family Medicine

## 2024-05-13 DIAGNOSIS — Z1231 Encounter for screening mammogram for malignant neoplasm of breast: Secondary | ICD-10-CM | POA: Diagnosis not present

## 2024-05-13 DIAGNOSIS — Z780 Asymptomatic menopausal state: Secondary | ICD-10-CM | POA: Diagnosis not present

## 2024-05-13 DIAGNOSIS — M85832 Other specified disorders of bone density and structure, left forearm: Secondary | ICD-10-CM | POA: Diagnosis not present

## 2024-05-15 ENCOUNTER — Ambulatory Visit: Payer: Self-pay | Admitting: Family Medicine

## 2024-07-08 DIAGNOSIS — Z885 Allergy status to narcotic agent status: Secondary | ICD-10-CM | POA: Diagnosis not present

## 2024-07-08 DIAGNOSIS — R3915 Urgency of urination: Secondary | ICD-10-CM | POA: Diagnosis not present

## 2024-07-08 DIAGNOSIS — Z888 Allergy status to other drugs, medicaments and biological substances status: Secondary | ICD-10-CM | POA: Diagnosis not present

## 2024-07-08 DIAGNOSIS — R351 Nocturia: Secondary | ICD-10-CM | POA: Diagnosis not present

## 2024-07-08 DIAGNOSIS — R350 Frequency of micturition: Secondary | ICD-10-CM | POA: Diagnosis not present

## 2024-07-08 DIAGNOSIS — Z79899 Other long term (current) drug therapy: Secondary | ICD-10-CM | POA: Diagnosis not present

## 2024-07-08 DIAGNOSIS — R600 Localized edema: Secondary | ICD-10-CM | POA: Diagnosis not present

## 2024-07-08 DIAGNOSIS — Z886 Allergy status to analgesic agent status: Secondary | ICD-10-CM | POA: Diagnosis not present

## 2024-07-08 DIAGNOSIS — G479 Sleep disorder, unspecified: Secondary | ICD-10-CM | POA: Diagnosis not present

## 2024-07-09 ENCOUNTER — Other Ambulatory Visit: Payer: Self-pay | Admitting: Family Medicine

## 2024-07-09 DIAGNOSIS — K219 Gastro-esophageal reflux disease without esophagitis: Secondary | ICD-10-CM

## 2024-07-10 NOTE — Telephone Encounter (Signed)
 Requested Prescriptions  Refused Prescriptions Disp Refills   pantoprazole  (PROTONIX ) 20 MG tablet [Pharmacy Med Name: PANTOPRAZOLE  SODIUM 20 MG Oral Tablet Delayed Release] 90 tablet 3    Sig: TAKE 1 TABLET EVERY DAY BEFORE BREAKFAST     Gastroenterology: Proton Pump Inhibitors Passed - 07/10/2024  4:33 PM      Passed - Valid encounter within last 12 months    Recent Outpatient Visits           2 months ago OAB (overactive bladder)   [facility] [place], Marsa PARAS, DO   3 months ago Greater trochanteric bursitis of right hip   [place] [facility] Marsa PARAS, DO   6 months ago Chronic pain of left knee   [facility] [place], Marsa PARAS, [place]

## 2024-07-21 DIAGNOSIS — M5416 Radiculopathy, lumbar region: Secondary | ICD-10-CM | POA: Diagnosis not present

## 2024-07-21 DIAGNOSIS — M7061 Trochanteric bursitis, right hip: Secondary | ICD-10-CM | POA: Diagnosis not present

## 2024-07-21 DIAGNOSIS — Z596 Low income: Secondary | ICD-10-CM | POA: Diagnosis not present

## 2024-07-31 ENCOUNTER — Encounter: Payer: Self-pay | Admitting: Family Medicine

## 2024-08-08 ENCOUNTER — Telehealth: Payer: Self-pay

## 2024-08-08 NOTE — Telephone Encounter (Signed)
 Copied from CRM [id number]. Topic: General - Other >> Aug 08, 2024 10:49 AM Rosaria BRAVO wrote: Reason for CRM: Pt called requesting to have mail order for her pharmacy with [facility]. She says she does not know the name of it however I see online that [facility] participates with [facility].   Curious about more affordable options.

## 2024-08-08 NOTE — Telephone Encounter (Signed)
 Pharmacy added to med list

## 2024-08-11 ENCOUNTER — Other Ambulatory Visit: Payer: Self-pay | Admitting: Family Medicine

## 2024-08-11 DIAGNOSIS — I10 Essential (primary) hypertension: Secondary | ICD-10-CM

## 2024-08-12 NOTE — Telephone Encounter (Signed)
 Rx 09/20/23 #90 3RF- too soon Requested Prescriptions  Pending Prescriptions Disp Refills   lisinopril  (ZESTRIL ) 20 MG tablet [Pharmacy Med Name: LISINOPRIL  20 MG Oral Tablet] 90 tablet 3    Sig: TAKE 1 TABLET EVERY DAY     Cardiovascular:  ACE Inhibitors Failed - 08/12/2024  4:08 PM      Failed - Cr in normal range and within 180 days    Creat  Date Value Ref Range Status  07/31/2022 0.90 0.60 - 1.00 mg/dL Final         Failed - K in normal range and within 180 days    Potassium  Date Value Ref Range Status  07/31/2022 4.3 3.5 - 5.3 mmol/L Final         Failed - Valid encounter within last 6 months    Recent Outpatient Visits           3 months ago OAB (overactive bladder)   [place] [facility] [place] Marsa PARAS, DO   4 months ago Greater trochanteric bursitis of right hip   [facility] [place] Marsa PARAS, DO   7 months ago Chronic pain of left knee   [facility] Marsa PARAS, [place] - Patient is not pregnant      Passed - Last BP in normal range    BP Readings from Last 1 Encounters:  04/24/24 120/74

## 2024-08-27 ENCOUNTER — Other Ambulatory Visit: Payer: Self-pay | Admitting: Family Medicine

## 2024-08-27 DIAGNOSIS — I10 Essential (primary) hypertension: Secondary | ICD-10-CM

## 2024-08-29 ENCOUNTER — Ambulatory Visit: Payer: Self-pay

## 2024-08-29 DIAGNOSIS — Z0000 Encounter for general adult medical examination without abnormal findings: Secondary | ICD-10-CM

## 2024-08-29 NOTE — Telephone Encounter (Signed)
 Requested medications are due for refill today.  yes  Requested medications are on the active medications list.  yes  Last refill. 09/20/2023 #90 3 rf  Future visit scheduled.   yes  Notes to clinic.  Labs are expired.    Requested Prescriptions  Pending Prescriptions Disp Refills   lisinopril  (ZESTRIL ) 20 MG tablet [Pharmacy Med Name: LISINOPRIL  20 MG Oral Tablet] 90 tablet 3    Sig: TAKE 1 TABLET EVERY DAY     Cardiovascular:  ACE Inhibitors Failed - 08/29/2024 12:34 PM      Failed - Cr in normal range and within 180 days    Creat  Date Value Ref Range Status  07/31/2022 0.90 0.60 - 1.00 mg/dL Final         Failed - K in normal range and within 180 days    Potassium  Date Value Ref Range Status  07/31/2022 4.3 3.5 - 5.3 mmol/L Final         Failed - Valid encounter within last 6 months    Recent Outpatient Visits           4 months ago OAB (overactive bladder)   [place] [facility] [place] Marsa PARAS, DO   5 months ago Greater trochanteric bursitis of right hip   [place] [facility] Marsa PARAS, DO   7 months ago Chronic pain of left knee   [facility] Marsa PARAS, [place] - Patient is not pregnant      Passed - Last BP in normal range    BP Readings from Last 1 Encounters:  04/24/24 120/74

## 2024-08-29 NOTE — Patient Instructions (Addendum)
 Megan Perez,  Thank you for taking the time for your Medicare Wellness Visit. I appreciate your continued commitment to your health goals. Please review the care plan we discussed, and feel free to reach out if I can assist you further.  Please note that Annual Wellness Visits do not include a physical exam. Some assessments may be limited, especially if the visit was conducted virtually. If needed, we may recommend an in-person follow-up with your provider.  Ongoing Care Seeing your primary care provider every 3 to 6 months helps us  monitor your health and provide consistent, personalized care.   Referrals If a referral was made during today's visit and you haven't received any updates within two weeks, please contact the referred provider directly to check on the status.  Recommended Screenings:  Health Maintenance  Topic Date Due   DTaP/Tdap/Td vaccine (2 - Td or Tdap) 09/18/2017   Flu Shot  04/18/2024   COVID-19 Vaccine (6 - 2025-26 season) 05/19/2024   Medicare Annual Wellness Visit  08/29/2025   Osteoporosis screening with Bone Density Scan  05/13/2029   Pneumococcal Vaccine for age over 64  Completed   Hepatitis C Screening  Completed   Zoster (Shingles) Vaccine  Completed   Meningitis B Vaccine  Aged Out   Breast Cancer Screening  Discontinued   Colon Cancer Screening  Discontinued       08/29/2024    2:40 PM  Advanced Directives  Does Patient Have a Medical Advance Directive? No  Would patient like information on creating a medical advance directive? No - Patient declined    Vision: Annual vision screenings are recommended for early detection of glaucoma, cataracts, and diabetic retinopathy. These exams can also reveal signs of chronic conditions such as diabetes and high blood pressure.  Dental: Annual dental screenings help detect early signs of oral cancer, gum disease, and other conditions linked to overall health, including heart disease and diabetes.  Please see  the attached documents for additional preventive care recommendations.   NEXT AWV 09/02/25 @ 1:40 PM BY VIDEO

## 2024-08-29 NOTE — Telephone Encounter (Signed)
 FYI Only or Action Required?: FYI only for provider: appointment scheduled on 09/01/2024.  Patient was last seen in primary care on 04/24/2024 by Edman Marsa PARAS, DO.  Called Nurse Triage reporting Hip Pain.  Symptoms began several weeks ago.  Interventions attempted: OTC medications: Tylenol , prednisone , steroid shot in hip.  Symptoms are: gradually worsening.  Triage Disposition: See PCP When Office is Open (Within 3 Days)  Patient/caregiver understands and will follow disposition?: Yes           Copied from CRM #[id number]. Topic: Clinical - Red Word Triage >> Aug 29, 2024  3:26 PM Shanda MATSU wrote: Red Word that prompted transfer to Nurse Triage: Patient is reporting right hip pain. Reason for Disposition  [1] MODERATE pain (e.g., interferes with normal activities, limping) AND [2] present > 3 days  Answer Assessment - Initial Assessment Questions This RN scheduled pt an appointment on 12/15 at [facility] due to acute symptoms. Pt aware of location. This RN educated pt on new-worsening symptoms and when to call back/seek emergent care. Pt verbalized understanding and agrees to plan.    R hip pain really bad today Radiates to back Burning on top of feet, intermittent numbness all the time (onset: last 3 months) Onset: been going for a while (a couple of months) 6-7/10 pain level right now, mostly there Pt states she was on prednisone  for 48 days, now the pain is right back Denies fever, difficulty walking, difficulty breathing, chest pain Swelling in ankles (left is worse chronic issue), worsening; pt states it has been there for years and thinks it is from her lisinopril   Protocols used: Hip Pain-A-AH

## 2024-08-29 NOTE — Progress Notes (Signed)
 Chief Complaint  Patient presents with   Medicare Wellness     Subjective:   Megan Perez is a 78 y.o. female who presents for a Medicare Annual Wellness Visit.  Visit info / Clinical Intake: Medicare Wellness Visit Type:: Subsequent Annual Wellness Visit Persons participating in visit and providing information:: patient Medicare Wellness Visit Mode:: Video Since this visit was completed virtually, some vitals may be partially provided or unavailable. Missing vitals are due to the limitations of the virtual format.: Unable to obtain vitals - no equipment If Telephone or Video please confirm:: I connected with patient using audio/video enable telemedicine. I verified patient identity with two identifiers, discussed telehealth limitations, and patient agreed to proceed. Patient Location:: home Provider Location:: office Interpreter Needed?: No Pre-visit prep was completed: yes AWV questionnaire completed by patient prior to visit?: no Living arrangements:: (!) lives alone Patient's Overall Health Status Rating: (!) fair Typical amount of pain: some Does pain affect daily life?: (!) yes Are you currently prescribed opioids?: no  Dietary Habits and Nutritional Risks How many meals a day?: 3 Eats fruit and vegetables daily?: yes Most meals are obtained by: preparing own meals In the last 2 weeks, have you had any of the following?: none Diabetic:: no  Functional Status Activities of Daily Living (to include ambulation/medication): Independent Ambulation: Independent Medication Administration: Independent Home Management (perform basic housework or laundry): Independent Manage your own finances?: yes Primary transportation is: driving Concerns about vision?: no *vision screening is required for WTM* (wears glasses all day- [place]) Concerns about hearing?: no  Fall Screening Falls in the past year?: 1 Number of falls in past year: 0 Was there an injury with Fall?: 0 Fall  Risk Category Calculator: 1 Patient Fall Risk Level: Low Fall Risk  Fall Risk Patient at Risk for Falls Due to: History of fall(s) Fall risk Follow up: Falls evaluation completed; Falls prevention discussed  Home and Transportation Safety: All rugs have non-skid backing?: yes All stairs or steps have railings?: yes Grab bars in the bathtub or shower?: yes Have non-skid surface in bathtub or shower?: yes Good home lighting?: yes Regular seat belt use?: yes Hospital stays in the last year:: no  Cognitive Assessment Difficulty concentrating, remembering, or making decisions? : no Will 6CIT or Mini Cog be Completed: yes What year is it?: 0 points What month is it?: 0 points Give patient an address phrase to remember (5 components): [address] [place], [place] About what time is it?: 0 points Count backwards from 20 to 1: 0 points Say the months of the year in reverse: 0 points Repeat the address phrase from earlier: 0 points 6 CIT Score: 0 points  Advance Directives (For Healthcare) Does Patient Have a Medical Advance Directive?: No Would patient like information on creating a medical advance directive?: No - Patient declined  Reviewed/Updated  Reviewed/Updated: Reviewed All (Medical, Surgical, Family, Medications, Allergies, Care Teams, Patient Goals)    Allergies (verified) Baclofen , Meclizine , Naproxen, Other, Oxybutynin , Penicillins, Prednisone , Pregabalin , Prevacid [lansoprazole], Gabapentin , Oxycodone, Pregabalin  er, and Tizanidine  hcl   Current Medications (verified) Outpatient Encounter Medications as of 08/29/2024  Medication Sig   acetaminophen  (TYLENOL ) 500 MG tablet Take 1,300 mg by mouth every 6 (six) hours as needed.    albuterol  (VENTOLIN  HFA) 108 (90 Base) MCG/ACT inhaler Inhale 2 puffs into the lungs every 6 (six) hours as needed for wheezing or shortness of breath.   azelastine (OPTIVAR) 0.05 % ophthalmic solution Place 2 drops into both eyes  daily as  needed.   diclofenac  Sodium (VOLTAREN  ARTHRITIS PAIN) 1 % GEL Apply 2 g topically 4 (four) times daily as needed (knee).   fluticasone  (FLONASE ) 50 MCG/ACT nasal spray Place 2 sprays into both nostrils daily. Use for 4-6 weeks then stop and use seasonally or as needed.   lisinopril  (ZESTRIL ) 20 MG tablet TAKE 1 TABLET EVERY DAY   pantoprazole  (PROTONIX ) 20 MG tablet TAKE 1 TABLET EVERY DAY BEFORE BREAKFAST   No facility-administered encounter medications on file as of 08/29/2024.    History: Past Medical History:  Diagnosis Date   Anxiety    Asthma    reactive airway and shortness of breath w/ exertion in hot temperatures   Bronchitis    GERD (gastroesophageal reflux disease)    Hypertension    Morbid obesity with BMI of 40.0-44.9, adult (HCC) 04/08/2012   OA (osteoarthritis)    knee   Osteoporosis    Prediabetes    Sinus infection    10/2018   Venous (peripheral) insufficiency 08/19/2010   Qualifier: Diagnosis of  By: Vicci MD, [place]     Past Surgical History:  Procedure Laterality Date   ABDOMINAL HYSTERECTOMY     CARPAL TUNNEL RELEASE Right    CHOLECYSTECTOMY     COLONOSCOPY WITH PROPOFOL  N/A 06/19/2017   Procedure: COLONOSCOPY WITH PROPOFOL ;  Surgeon: Jinny Carmine, MD;  Location: [facility] ENDOSCOPY;  Service: Endoscopy;  Laterality: N/A;   HEEL SPUR SURGERY     REPLACEMENT TOTAL KNEE     left   REPLACEMENT TOTAL KNEE Right    SHOULDER ARTHROSCOPY WITH OPEN ROTATOR CUFF REPAIR Right 09/15/2015   Procedure: SHOULDER ARTHROSCOPY WITH OPEN ROTATOR CUFF REPAIR;  Surgeon: Kayla Pinal, MD;  Location: [facility] ORS;  Service: Orthopedics;  Laterality: Right;   SHOULDER SURGERY Left    Family History  Adopted: Yes  Problem Relation Age of Onset   Heart disease Father    Subarachnoid hemorrhage Mother    Hypertension Mother    Subarachnoid hemorrhage Sister    Hypertension Sister    Hypertension Sister    Hypertension Sister    Healthy Sister    Breast cancer Neg Hx     Social History   Occupational History   Occupation: retired  Tobacco Use   Smoking status: Never   Smokeless tobacco: Never  Vaping Use   Vaping status: Never Used  Substance and Sexual Activity   Alcohol use: No   Drug use: No   Sexual activity: Not Currently    Birth control/protection: Surgical   Tobacco Counseling Counseling given: Not Answered  SDOH Screenings   Food Insecurity: No Food Insecurity (08/29/2024)  Housing: Unknown (08/29/2024)  Transportation Needs: No Transportation Needs (08/29/2024)  Utilities: Not At Risk (08/29/2024)  Alcohol Screen: Low Risk (08/24/2023)  Depression (PHQ2-9): Low Risk (08/29/2024)  Financial Resource Strain: Low Risk  (05/12/2024)   Received from [facility]  Physical Activity: Inactive (08/29/2024)  Social Connections: Moderately Isolated (08/29/2024)  Stress: No Stress Concern Present (08/29/2024)  Tobacco Use: Low Risk (08/29/2024)  Health Literacy: Adequate Health Literacy (08/29/2024)   See flowsheets for full screening details  Depression Screen PHQ 2 & 9 Depression Scale- Over the past 2 weeks, how often have you been bothered by any of the following problems? Little interest or pleasure in doing things: 0 Feeling down, depressed, or hopeless (PHQ Adolescent also includes...irritable): 1 PHQ-2 Total Score: 1 Trouble falling or staying asleep, or sleeping too much: 0 Feeling tired or having little  energy: 0 Poor appetite or overeating (PHQ Adolescent also includes...weight loss): 0 Feeling bad about yourself - or that you are a failure or have let yourself or your family down: 0 Trouble concentrating on things, such as reading the newspaper or watching television (PHQ Adolescent also includes...like school work): 0 Moving or speaking so slowly that other people could have noticed. Or the opposite - being so fidgety or restless that you have been moving around a lot more than usual: 0 Thoughts that you  would be better off dead, or of hurting yourself in some way: 0 PHQ-9 Total Score: 1 If you checked off any problems, how difficult have these problems made it for you to do your work, take care of things at home, or get along with other people?: Not difficult at all  Depression Treatment Depression Interventions/Treatment : EYV7-0 Score <4 Follow-up Not Indicated     Goals Addressed             This Visit's Progress    Cut out extra servings               Objective:    There were no vitals filed for this visit. There is no height or weight on file to calculate BMI.  Hearing/Vision screen Hearing Screening - Comments:: NO AIDS Vision Screening - Comments:: WEARS GLASSES ALL DAY- [place] Immunizations and Health Maintenance Health Maintenance  Topic Date Due   DTaP/Tdap/Td (2 - Td or Tdap) 09/18/2017   Influenza Vaccine  04/18/2024   COVID-19 Vaccine (6 - 2025-26 season) 05/19/2024   Medicare Annual Wellness (AWV)  08/29/2025   Bone Density Scan  05/13/2029   Pneumococcal Vaccine: 50+ Years  Completed   Hepatitis C Screening  Completed   Zoster Vaccines- Shingrix  Completed   Meningococcal B Vaccine  Aged Out   Mammogram  Discontinued   Colonoscopy  Discontinued        Assessment/Plan:  This is a routine wellness examination for [facility].  Patient Care Team: Ed[place] Marsa PARAS, DO as PCP - General (Family Medicine) Fernand Denyse LABOR, MD as Consulting Physician (Cardiology) Cleotilde Barrio, MD (Specialist) [facility], [facility]  I have personally reviewed and noted the following in the patients chart:   Medical and social history Use of alcohol, tobacco or illicit drugs  Current medications and supplements including opioid prescriptions. Functional ability and status Nutritional status Physical activity Advanced directives List of other physicians Hospitalizations, surgeries, and ER visits in previous 12 months Vitals Screenings to include  cognitive, depression, and falls Referrals and appointments  No orders of the defined types were placed in this encounter.  In addition, I have reviewed and discussed with patient certain preventive protocols, quality metrics, and best practice recommendations. A written personalized care plan for preventive services as well as general preventive health recommendations were provided to patient.   Jhonnie GORMAN Das, LPN   [id number]/7974   Return in 1 year (on 08/29/2025).  After Visit Summary: (MyChart) Due to this being a telephonic visit, the after visit summary with patients personalized plan was offered to patient via MyChart   Nurse Notes: DECLINES FLU SHOT, NEEDS TDAP & PNA; UTD ON MAMMOGRAM & BDS; AGED OUT COLONOSCOPY No voiced or noted concerns at this time

## 2024-09-01 ENCOUNTER — Other Ambulatory Visit (INDEPENDENT_AMBULATORY_CARE_PROVIDER_SITE_OTHER): Admitting: Radiology

## 2024-09-01 ENCOUNTER — Encounter: Payer: Self-pay | Admitting: Family Medicine

## 2024-09-01 ENCOUNTER — Encounter: Payer: Self-pay | Admitting: Student

## 2024-09-01 ENCOUNTER — Ambulatory Visit (INDEPENDENT_AMBULATORY_CARE_PROVIDER_SITE_OTHER): Admitting: Family Medicine

## 2024-09-01 ENCOUNTER — Encounter: Admitting: Student

## 2024-09-01 DIAGNOSIS — M25551 Pain in right hip: Secondary | ICD-10-CM

## 2024-09-01 DIAGNOSIS — M5136 Other intervertebral disc degeneration, lumbar region with discogenic back pain only: Secondary | ICD-10-CM

## 2024-09-01 MED ORDER — METHYLPREDNISOLONE ACETATE 40 MG/ML IJ SUSP
40.0000 mg | Freq: Once | INTRAMUSCULAR | Status: AC
  Administered 2024-09-01: 11:00:00 40 mg via INTRA_ARTICULAR

## 2024-09-01 MED ORDER — CELECOXIB 50 MG PO CAPS: 50.0000 mg | ORAL_CAPSULE | Freq: Two times a day (BID) | ORAL | 0 refills | Status: DC | PRN

## 2024-09-01 NOTE — Patient Instructions (Signed)
 VISIT SUMMARY:  Today, you were seen for persistent right hip pain and new central back pain. We administered a corticosteroid injection to your right hip and provided a new pain management plan.  YOUR PLAN:  GREATER TROCHANTERIC PAIN SYNDROME, RIGHT HIP: You have chronic right hip pain likely due to muscle tightness and fraying, which is consistent with greater trochanteric pain syndrome. -You received an ultrasound-guided corticosteroid injection to your right hip. -You were prescribed Celebrex  to be taken as needed for pain management. Please take it with food. -We provided you with a home exercise program to improve muscle flexibility and reduce recurrence of symptoms. -You were referred to physical therapy for further management, with an emphasis on cost-effective options.  LOW BACK PAIN: You have central low back pain without symptoms radiating down your legs. -You were prescribed Celebrex  to be taken as needed for back pain management. Please take it with food. -Continue with your home exercises to address muscle tightness and prevent exacerbation of hip pain.

## 2024-09-01 NOTE — Progress Notes (Unsigned)
 Established Patient Office Visit  Subjective   Patient ID: Megan Perez, female    DOB: [birth year]  Age: 78 y.o. MRN: [id number]  Chief Complaint  Patient presents with   Hip Pain    Right hip pain x 3 months. Patient has taken prednisone  for treatment but 2 weeks after pain is right back to where it was. She was told it is bursitis and that her next steps would be a cortisone injection to see if that helps. Pain is constant. Pain level is a 6.5/10. She is unable to sleep. Her hip catches while walking ans she has had a fall due to her hip.     Megan Perez is a 78 y.o. person with medical hx listed below who presents today for right lateral hip pain that has worsened for the past month. Pain does not radiate. Last week went to went to a funeral and feels hip pain has worsened since then. Pain is associated with aching of the R lumber back. Feels hip is catching when she walks. Had steroid shot with orthopedic 6 month that lasted 2 weeks. Pain returned and then she was given prednisone  for this reports this helped but pain returned once she completed steroid. She report falling 3 months ago but no fall since then. She is taking tylenol  for this without much relief. Cannot take ibuprofen  due to hx of GI bleeding on NSAIDS.   Patient Active Problem List   Diagnosis Date Noted   Osteoarthritis of shoulder (Left) 10/12/2022   Osteoarthritis of glenohumeral joint (Left) 10/12/2022   Osteoarthritis of AC (acromioclavicular) joint (Left) 10/12/2022   Biceps tendinitis of shoulder (Left) 10/02/2022   Chronic shoulder pain (Left) 10/02/2022   Primary insomnia 03/02/2021   Other spondylosis, sacral and sacrococcygeal region 09/14/2020   Muscle spasms of both lower extremities 03/18/2020   Swelling of limb 03/12/2020   Psychophysiological insomnia 02/27/2020   Lymphedema of both lower extremities 02/27/2020   Drug-induced myopathy 02/27/2020   Pure hypercholesterolemia 02/27/2020   Chronic hip  pain (Left) 10/15/2019   Chronic sacroiliac joint pain (Left) 10/15/2019   Chronic groin pain (Left) 10/15/2019   Chronic peripheral neuropathic pain 07/07/2019   Neurogenic pain 07/07/2019   Chronic lumbar radiculitis (L5) (Right) 07/07/2019   Chronic musculoskeletal pain 07/07/2019   Chronic low back pain (1ry area of Pain) (Bilateral) (R>L) w/o sciatica 06/12/2019   DDD (degenerative disc disease), lumbar 03/04/2019   Spondylosis without myelopathy or radiculopathy, lumbosacral region 03/04/2019   Chronic sacroiliac joint pain (Bilateral) (R>L) 12/04/2018   Lumbar facet syndrome (Bilateral) (R>L) 12/04/2018   Lumbar (6 mm) Grade 1 Anterolisthesis of L4/L5 and L3/L4 12/04/2018   Sacrococcygeal disorders, not elsewhere classified 12/04/2018   History of knee replacement, total (Left) 12/03/2018   Chronic knee pain after total knee replacement (Bilateral) 12/03/2018   Vitamin D  deficiency 12/03/2018   Chronic low back pain (1ry area of Pain) (Bilateral) (R>L) w/ sciatica (Right) 11/05/2018   Chronic lower extremity pain (2ry area of Pain) (Right) 11/05/2018   Chronic knee pain (3ry area of Pain) (Bilateral) (R>L) 11/05/2018   Chronic pain syndrome 11/05/2018   Pharmacologic therapy 11/05/2018   Disorder of skeletal system 11/05/2018   Problems influencing health status 11/05/2018   Screening for colorectal cancer    Inguinal hernia, left 05/11/2017   History of total knee replacement (Right) 03/08/2017   Mild intermittent asthma without complication 01/16/2017   Degenerative spondylolisthesis 08/02/2016   Prediabetes 02/16/2016   Full thickness  rotator cuff tear 08/31/2015   Impingement syndrome of shoulder region 08/02/2015   Osteoarthrosis, localized, primary, knee 04/21/2014   Rotator cuff rupture, complete 04/21/2014   Allergic rhinitis 01/21/2014   Morbid obesity with BMI of 40.0-44.9, adult (HCC) 04/08/2012   Arthritis 11/24/2011   Essential hypertension 08/19/2010    Chronic venous insufficiency 08/19/2010   GERD 08/19/2010      ROS Refer to HPI    Objective:     Outpatient Encounter Medications as of 09/01/2024  Medication Sig   acetaminophen  (TYLENOL ) 500 MG tablet Take 1,300 mg by mouth every 6 (six) hours as needed.    albuterol  (VENTOLIN  HFA) 108 (90 Base) MCG/ACT inhaler Inhale 2 puffs into the lungs every 6 (six) hours as needed for wheezing or shortness of breath.   azelastine (OPTIVAR) 0.05 % ophthalmic solution Place 2 drops into both eyes daily as needed.   diclofenac  Sodium (VOLTAREN  ARTHRITIS PAIN) 1 % GEL Apply 2 g topically 4 (four) times daily as needed (knee).   fluticasone  (FLONASE ) 50 MCG/ACT nasal spray Place 2 sprays into both nostrils daily. Use for 4-6 weeks then stop and use seasonally or as needed.   lisinopril  (ZESTRIL ) 20 MG tablet TAKE 1 TABLET EVERY DAY   pantoprazole  (PROTONIX ) 20 MG tablet TAKE 1 TABLET EVERY DAY BEFORE BREAKFAST   No facility-administered encounter medications on file as of 09/01/2024.    BP 120/80   Pulse 89   Ht 5' 7 (1.702 m)   Wt 260 lb (117.9 kg)   SpO2 96%   BMI 40.72 kg/m  BP Readings from Last 3 Encounters:  09/01/24 120/80  04/24/24 120/74  03/20/24 130/70    Physical Exam     09/01/2024   10:16 AM 08/29/2024    2:53 PM 09/20/2023   10:15 AM  Depression screen PHQ 2/9  Decreased Interest 0 0 1  Down, Depressed, Hopeless 0 1 0  PHQ - 2 Score 0 1 1  Altered sleeping  0 2  Tired, decreased energy  0 0  Change in appetite  0 0  Feeling bad or failure about yourself   0 0  Trouble concentrating  0 0  Moving slowly or fidgety/restless  0 0  Suicidal thoughts  0 0  PHQ-9 Score  1 3   Difficult doing work/chores  Not difficult at all      Data saved with a previous flowsheet row definition       09/20/2023   10:15 AM 08/15/2022    2:26 PM 04/12/2022   11:15 AM 12/06/2021    9:46 AM  GAD 7 : Generalized Anxiety Score  Nervous, Anxious, on Edge 0 0 0 0  Control/stop  worrying 1 1 1 1   Worry too much - different things 1 1 1 1   Trouble relaxing 1 1 1 1   Restless 0 1 1 1   Easily annoyed or irritable 1 0 0 0  Afraid - awful might happen 0 0 0 0  Total GAD 7 Score 4 4 4 4   Anxiety Difficulty  Not difficult at all Not difficult at all Not difficult at all    No results found for any visits on 09/01/24.  Last CBC Lab Results  Component Value Date   WBC 4.2 07/31/2022   HGB 11.9 07/31/2022   HCT 36.3 07/31/2022   MCV 94.3 07/31/2022   MCH 30.9 07/31/2022   RDW 13.0 07/31/2022   PLT 294 07/31/2022   Last metabolic panel Lab Results  Component  Value Date   GLUCOSE 104 (H) 07/31/2022   NA 142 07/31/2022   K 4.3 07/31/2022   CL 105 07/31/2022   CO2 27 07/31/2022   BUN 21 07/31/2022   CREATININE 0.90 07/31/2022   EGFR 90 05/31/2021   CALCIUM  9.3 07/31/2022   PROT 6.9 07/31/2022   ALBUMIN 3.9 01/16/2017   BILITOT 0.3 07/31/2022   ALKPHOS 73 01/16/2017   AST 9 (L) 07/31/2022   ALT 7 07/31/2022   ANIONGAP 11 12/13/2017   Last lipids Lab Results  Component Value Date   CHOL 185 07/31/2022   HDL 65 07/31/2022   LDLCALC 105 (H) 07/31/2022   TRIG 66 07/31/2022   CHOLHDL 2.8 07/31/2022   Last hemoglobin A1c Lab Results  Component Value Date   HGBA1C 6.3 (H) 07/31/2022      The 10-year ASCVD risk score (Arnett DK, et al., 2019) is: 17.6%    Assessment & Plan:  There are no diagnoses linked to this encounter.   No follow-ups on file.    Harlene Saddler, MD

## 2024-09-01 NOTE — Progress Notes (Signed)
 Primary Care / Sports Medicine Office Visit  Patient Information:  Patient ID: Megan Perez, female DOB: 1945/12/04 Age: 78 y.o. MRN: [id number]   Megan Perez is a pleasant 78 y.o. female presenting with the following:  Chief Complaint  Patient presents with   Hip Pain    Right hip pain x 3 months. She has taken prednisone  which helped while she was on it but 2 weeks later pain has returned. Pain is on the outer hip. She had injection in the past which helped and she would like a cortisone injection for today's visit.     There were no vitals filed for this visit. There were no vitals filed for this visit. There is no height or weight on file to calculate BMI.  No results found.   Discussed the use of AI scribe software for clinical note transcription with the patient, who gave verbal consent to proceed.   Independent interpretation of notes and tests performed by another provider:   None  Procedures performed:   Procedure:  Injection of right hip under ultrasound guidance. Ultrasound guidance utilized for out-of-plane approach to right greater trochanteric region Samsung HS60 device utilized with permanent recording / reporting. Verbal informed consent obtained and verified. Skin prepped in a sterile fashion. Ethyl chloride for topical local analgesia.  Completed without difficulty and tolerated well. Medication: triamcinolone  acetonide 40 mg/mL suspension for injection 1 mL total and 2 mL lidocaine  1% without epinephrine  utilized for needle placement anesthetic Advised to contact for fevers/chills, erythema, induration, drainage, or persistent bleeding.   Pertinent History, Exam, Impression, and Recommendations:   History of Present Illness Megan Perez is a 78 year old female who presents with persistent right hip pain. She was referred by [facility] for further evaluation of her hip pain.  Right hip pain - Persistent pain for two months, localized to the  right posterolateral hip - Pain does not radiate down the leg - Temporary relief with a two-week course of prednisone  (48 pills total); pain recurred two weeks after completion - Tylenol  Arthritis (two tablets in the morning and two at night) initially effective, now ineffective - No prior hip injection - Unable to take ibuprofen  due to history of gastrointestinal bleeding  Central back pain - Onset after attending a film last Thursday - Described as feeling like 'a ton of grits' in the central back  Musculoskeletal surgical history - Total shoulder replacement - Bilateral knee replacements - Rotator cuff surgery  Physical therapy and functional status - Extensive prior experience with physical therapy - Financial constraints due to high copays limit access to regular sessions - Lives alone and manages own bills - Worked as a associate professor for 57 years, involving prolonged standing and physical activity  Physical Exam PALPATION: Focal tenderness just posterior to the right greater trochanter.  Assessment and Plan Greater trochanteric pain syndrome, right hip Chronic right hip pain consistent with greater trochanteric pain syndrome. Previous prednisone  ineffective. Pain likely due to muscle tightness and fraying, exacerbated by low back issues. - Administered ultrasound-guided corticosteroid injection to the right hip. - Prescribed Celebrex  as needed for pain management, to be taken with food. - Provided home exercise program to improve muscle flexibility and reduce recurrence of symptoms. - Referred to physical therapy for further management, with emphasis on cost-effective options.  Low back pain Central low back pain. No radicular symptoms. Celebrex  prescribed for additional relief. - Prescribed Celebrex  as needed for back pain management, to be taken  with food. - Encouraged continuation of home exercises to address muscle tightness and prevent exacerbation of hip  pain.  Problem List Items Addressed This Visit     DDD (degenerative disc disease), lumbar (Chronic)   Relevant Medications   celecoxib  (CELEBREX ) 50 MG capsule   Other Relevant Orders   Ambulatory referral to Physical Therapy   Other Visit Diagnoses       Greater trochanteric pain syndrome of right lower extremity    -  Primary   Relevant Medications   celecoxib  (CELEBREX ) 50 MG capsule   methylPREDNISolone  acetate (DEPO-MEDROL ) injection 40 mg (Completed) (Start on 09/01/2024 11:30 AM)   Other Relevant Orders   US  LIMITED JOINT SPACE STRUCTURES LOW RIGHT   Ambulatory referral to Physical Therapy        Orders & Medications Medications:  Meds ordered this encounter  Medications   celecoxib  (CELEBREX ) 50 MG capsule    Sig: Take 1 capsule (50 mg total) by mouth 2 (two) times daily as needed for pain. Take sparingly.    Dispense:  30 capsule    Refill:  0   methylPREDNISolone  acetate (DEPO-MEDROL ) injection 40 mg   Orders Placed This Encounter  Procedures   US  LIMITED JOINT SPACE STRUCTURES LOW RIGHT   Ambulatory referral to Physical Therapy     No follow-ups on file.     Selinda JINNY Ku, MD, [facility]   Primary Care Sports Medicine Primary Care and Sports Medicine at [facility]

## 2024-09-05 ENCOUNTER — Ambulatory Visit: Payer: Self-pay | Admitting: Family Medicine

## 2024-09-05 DIAGNOSIS — M5136 Other intervertebral disc degeneration, lumbar region with discogenic back pain only: Secondary | ICD-10-CM

## 2024-09-05 DIAGNOSIS — M25551 Pain in right hip: Secondary | ICD-10-CM

## 2024-09-05 NOTE — Telephone Encounter (Signed)
 Will defer to PCP upon his return to make sure that he is okay with patient taking 100 mg twice daily.  Kidney function was normal when last checked in 2023.  Not currently on any blood thinners.

## 2024-09-05 NOTE — Telephone Encounter (Signed)
 FYI Only or Action Required?: Action required by provider: medication refill request and update on patient condition.  Patient was last seen in primary care on 09/01/2024 by Alvia Selinda PARAS, MD.  Called Nurse Triage reporting Advice Only.  Symptoms began several years ago.  Interventions attempted: Prescription medications: Celebrex .  Symptoms are: stable.  Triage Disposition: Information or Advice Only Call  Patient/caregiver understands and will follow disposition?: Yes  Copied from CRM #[id number]. Topic: Clinical - Red Word Triage >> Sep 05, 2024 11:54 AM Lonell PEDLAR wrote: Red Word that prompted transfer to Nurse Triage: Feels as though medication was ineffective after a few days. She increased the dose to 2 pills in the morning and 2 pills in the evening and this seems to have helped. Patient has done this for the past 2 days.    ----------------------------------------------------------------------- From previous Reason for Contact - Medication Question: Reason for CRM: Patient has questions regarding celecoxib  (CELEBREX ) 50 MG capsule.  Feels as though medication was ineffective after a few days. She increased the dose to 2 pills in the morning and 2 pills in the evening and this seems to have helped. Patient has done this for the past 2 days. Reason for Disposition  Health information question, no triage required and triager able to answer question  Answer Assessment - Initial Assessment Questions 1. REASON FOR CALL: What is the main reason for your call? or How can I best help you?     Is taking celebrex  for back pain. She was told to take them sparingly but it wasn't helping her. She took 2 in the morning and 2 at night yesterday and it helped her and wants to see if doctor will prescribe it this way. States she slept better. Daughter takes 100mg  and it works for her so patient would like that dose too.  [facility] [address] used: Information Only Call - No  Triage-A-AH

## 2024-09-08 MED ORDER — CELECOXIB 100 MG PO CAPS: 100.0000 mg | ORAL_CAPSULE | Freq: Two times a day (BID) | ORAL | 2 refills | Status: DC

## 2024-09-08 NOTE — Addendum Note (Signed)
 Addended by: EDMAN MARSA PARAS on: 09/08/2024 02:15 PM   Modules accepted: Orders

## 2024-09-08 NOTE — Telephone Encounter (Signed)
 I agree with Regina's comments. Her kidney function was normal back in November.  If she is doing better on 100mg  twice a day of Celebrex , that is okay for short term. It should not be a long term medication or should be used more intermittently or at lower doses in the future once her symptoms calm down.  Marsa Officer, DO [facility] 09/08/2024, 11:40 AM

## 2024-09-08 NOTE — Telephone Encounter (Signed)
 Spoke with patient, explained to her that the celebrex  has been called in. And the mgs changed.

## 2024-09-08 NOTE — Telephone Encounter (Signed)
 Please review.  KP

## 2024-09-08 NOTE — Telephone Encounter (Signed)
 Okay, please let her know that I have sent Celebrex  100mg  capsules to [facility].  I sent the higher dose instead of 50mg  it is now 100mg , take twice a day follow instructions  If they cannot fill it today we can transfer it to [place] or the pharmacy can transfer it  Marsa Officer, DO [facility] 09/08/2024, 2:15 PM

## 2024-09-12 ENCOUNTER — Other Ambulatory Visit: Payer: Self-pay | Admitting: Family Medicine

## 2024-09-12 DIAGNOSIS — I10 Essential (primary) hypertension: Secondary | ICD-10-CM

## 2024-09-15 NOTE — Telephone Encounter (Signed)
 Too soon for refill.  Requested Prescriptions  Pending Prescriptions Disp Refills   lisinopril  (ZESTRIL ) 20 MG tablet [Pharmacy Med Name: LISINOPRIL  20 MG Oral Tablet] 90 tablet 3    Sig: TAKE 1 TABLET EVERY DAY     Cardiovascular:  ACE Inhibitors Failed - 09/15/2024  2:42 PM      Failed - Cr in normal range and within 180 days    Creat  Date Value Ref Range Status  07/31/2022 0.90 0.60 - 1.00 mg/dL Final         Failed - K in normal range and within 180 days    Potassium  Date Value Ref Range Status  07/31/2022 4.3 3.5 - 5.3 mmol/L Final         Passed - Patient is not pregnant      Passed - Last BP in normal range    BP Readings from Last 1 Encounters:  09/01/24 120/80         Passed - Valid encounter within last 6 months    Recent Outpatient Visits           2 weeks ago Greater trochanteric pain syndrome of right lower extremity   [place] [facility], Selinda PARAS, MD   4 months ago OAB (overactive bladder)   [facility] [place] Marsa PARAS, DO   5 months ago Greater trochanteric bursitis of right hip   [facility] [place] Marsa PARAS, DO   8 months ago Chronic pain of left knee   [facility] [place], Marsa PARAS, [place]

## 2024-09-16 NOTE — Telephone Encounter (Signed)
 Needs to come in for reevaluation given the current Celebrex  dose and injection we had recently done. Please schedule.  JJM

## 2024-09-29 ENCOUNTER — Other Ambulatory Visit (HOSPITAL_COMMUNITY): Payer: Self-pay

## 2024-09-29 ENCOUNTER — Ambulatory Visit (INDEPENDENT_AMBULATORY_CARE_PROVIDER_SITE_OTHER): Admitting: Family Medicine

## 2024-09-29 ENCOUNTER — Ambulatory Visit: Attending: Family Medicine

## 2024-09-29 ENCOUNTER — Telehealth: Payer: Self-pay

## 2024-09-29 ENCOUNTER — Encounter: Payer: Self-pay | Admitting: Family Medicine

## 2024-09-29 VITALS — BP 130/84 | HR 82 | Ht 67.0 in | Wt 258.1 lb

## 2024-09-29 DIAGNOSIS — M6281 Muscle weakness (generalized): Secondary | ICD-10-CM | POA: Diagnosis present

## 2024-09-29 DIAGNOSIS — Z23 Encounter for immunization: Secondary | ICD-10-CM | POA: Diagnosis not present

## 2024-09-29 DIAGNOSIS — K219 Gastro-esophageal reflux disease without esophagitis: Secondary | ICD-10-CM | POA: Diagnosis not present

## 2024-09-29 DIAGNOSIS — M5136 Other intervertebral disc degeneration, lumbar region with discogenic back pain only: Secondary | ICD-10-CM | POA: Diagnosis not present

## 2024-09-29 DIAGNOSIS — J0110 Acute frontal sinusitis, unspecified: Secondary | ICD-10-CM

## 2024-09-29 DIAGNOSIS — M25551 Pain in right hip: Secondary | ICD-10-CM | POA: Diagnosis not present

## 2024-09-29 DIAGNOSIS — I10 Essential (primary) hypertension: Secondary | ICD-10-CM

## 2024-09-29 DIAGNOSIS — N3281 Overactive bladder: Secondary | ICD-10-CM | POA: Diagnosis not present

## 2024-09-29 MED ORDER — CELECOXIB 100 MG PO CAPS
100.0000 mg | ORAL_CAPSULE | Freq: Two times a day (BID) | ORAL | 2 refills | Status: AC
  Filled 2024-09-29: qty 60, 30d supply, fill #0

## 2024-09-29 MED ORDER — PANTOPRAZOLE SODIUM 20 MG PO TBEC
20.0000 mg | DELAYED_RELEASE_TABLET | Freq: Every day | ORAL | 3 refills | Status: AC
  Filled 2024-09-29: qty 90, 90d supply, fill #0

## 2024-09-29 MED ORDER — LISINOPRIL 20 MG PO TABS
20.0000 mg | ORAL_TABLET | Freq: Every day | ORAL | 3 refills | Status: AC
  Filled 2024-09-29: qty 90, 90d supply, fill #0

## 2024-09-29 MED ORDER — FLUTICASONE PROPIONATE 50 MCG/ACT NA SUSP
2.0000 | Freq: Every day | NASAL | 3 refills | Status: AC
  Filled 2024-09-29: qty 16, 30d supply, fill #0

## 2024-09-29 NOTE — Progress Notes (Unsigned)
 "  Subjective:    Patient ID: Megan Perez, female    DOB: [birth year], 79 y.o.   MRN: [id number]  Megan Perez is a 79 y.o. female presenting on 09/29/2024 for Medical Management of Chronic Issues   HPI  Right Hip Pain  Last seen by Sports Medicine 09/01/24 Dr Alvia *** *** She received R hip bursa injection  Nocturia / Urinary Frequency [facility] Initial visit  ***  Health Maintenance: ***     09/29/2024    9:44 AM 09/01/2024   10:16 AM 08/29/2024    2:53 PM  Depression screen PHQ 2/9  Decreased Interest 1 0 0  Down, Depressed, Hopeless 0 0 1  PHQ - 2 Score 1 0 1  Altered sleeping 3  0  Tired, decreased energy 1  0  Change in appetite 1  0  Feeling bad or failure about yourself  0  0  Trouble concentrating 0  0  Moving slowly or fidgety/restless   0  Suicidal thoughts 0  0  PHQ-9 Score 6  1  Difficult doing work/chores Not difficult at all  Not difficult at all       09/29/2024    9:44 AM 09/20/2023   10:15 AM 08/15/2022    2:26 PM 04/12/2022   11:15 AM  GAD 7 : Generalized Anxiety Score  Nervous, Anxious, on Edge 0 0 0 0  Control/stop worrying 1 1 1 1   Worry too much - different things 1 1 1 1   Trouble relaxing 1 1 1 1   Restless 0 0 1 1  Easily annoyed or irritable 0 1 0 0  Afraid - awful might happen 0 0 0 0  Total GAD 7 Score 3 4 4 4   Anxiety Difficulty Not difficult at all  Not difficult at all Not difficult at all    Social History[1]  Review of Systems Per HPI unless specifically indicated above     Objective:    BP 130/84 (BP Location: Left Arm, Patient Position: Sitting, Cuff Size: Large)   Pulse 82   Ht 5' 7 (1.702 m)   Wt 258 lb 2 oz (117.1 kg)   SpO2 99%   BMI 40.43 kg/m   Wt Readings from Last 3 Encounters:  09/29/24 258 lb 2 oz (117.1 kg)  09/01/24 260 lb (117.9 kg)  04/24/24 253 lb 6 oz (114.9 kg)    Physical Exam  Results for orders placed or performed in visit on 03/20/24  POCT Urinalysis Dipstick   Collection Time:  03/20/24 11:02 AM  Result Value Ref Range   Color, UA yellow    Clarity, UA clear    Glucose, UA Negative Negative   Bilirubin, UA small    Ketones, UA negative    Spec Grav, UA 1.020 1.010 - 1.025   Blood, UA negative    pH, UA 5.0 5.0 - 8.0   Protein, UA Negative Negative   Urobilinogen, UA 0.2 0.2 or 1.0 E.U./dL   Nitrite, UA negative    Leukocytes, UA Negative Negative   Appearance     Odor positive       Assessment & Plan:   Problem List Items Addressed This Visit     DDD (degenerative disc disease), lumbar (Chronic)   Relevant Medications   celecoxib  (CELEBREX ) 100 MG capsule   Essential hypertension - Primary   Relevant Medications   lisinopril  (ZESTRIL ) 20 MG tablet   GERD   Relevant Medications   pantoprazole  (PROTONIX ) 20 MG  tablet   Other Visit Diagnoses       Flu vaccine need       Relevant Orders   Flu vaccine HIGH DOSE PF(Fluzone Trivalent) (Completed)     Greater trochanteric pain syndrome of right lower extremity       Relevant Medications   celecoxib  (CELEBREX ) 100 MG capsule     Acute non-recurrent frontal sinusitis       Relevant Medications   fluticasone  (FLONASE ) 50 MCG/ACT nasal spray        ***  ***Referral to [facility] Pharmacist - [facility] Mail Order savings program, may need to do the online registration, and this is for Vesicare  rx  Orders Placed This Encounter  Procedures   Flu vaccine HIGH DOSE PF(Fluzone Trivalent)    Meds ordered this encounter  Medications   pantoprazole  (PROTONIX ) 20 MG tablet    Sig: TAKE 1 TABLET EVERY DAY BEFORE BREAKFAST    Dispense:  90 tablet    Refill:  3    Mail Order   lisinopril  (ZESTRIL ) 20 MG tablet    Sig: Take 1 tablet (20 mg total) by mouth daily.    Dispense:  90 tablet    Refill:  3    Mail Order   celecoxib  (CELEBREX ) 100 MG capsule    Sig: Take 1 capsule (100 mg total) by mouth 2 (two) times daily.    Dispense:  60 capsule    Refill:  2    Mail Order   fluticasone  (FLONASE )  50 MCG/ACT nasal spray    Sig: Place 2 sprays into both nostrils daily. Use for 4-6 weeks then stop and use seasonally or as needed.    Dispense:  16 g    Refill:  3    Mail Order    Follow up plan: Return if symptoms worsen or fail to improve.  Marsa Officer, DO [facility] 09/29/2024, 9:59 AM    [1]  Social History Tobacco Use   Smoking status: Never   Smokeless tobacco: Never  Vaping Use   Vaping status: Never Used  Substance Use Topics   Alcohol use: No   Drug use: No   "

## 2024-09-29 NOTE — Telephone Encounter (Signed)
 Copied from CRM #[id number]. Topic: Clinical - Medical Advice >> Sep 29, 2024 12:15 PM Jasmin G wrote: Reason for CRM: Pt requested a call back at  [phone] from Dr. Alvia team, as she was recently seen by him to discuss the possibility of ordering a dexamethasone  patch treatment, pt was told by PCP to call Dr. Alvia as he is a sports medicine doctor.

## 2024-09-29 NOTE — Patient Instructions (Addendum)
 Thank you for coming to the office today.  Dr Alvia already referred you last time 09/01/24  I am not sure why it has not been scheduled yet.  Please call them to check status of referral.  You call them  [facility] [address] [place] [address],  [place]  [address] Main: [phone]  Ask the therapist about the Dexamethasone  patch treatment.  If they have any issues, please have them call us  or communicate any concerns.  ALL medications sent to [facility] for Mail Order  --------------------------------------------------------------------------  She will call you  Referral to [facility] Pharmacist - Sharyle - she can call and help with the savings program - [employer] [facility] Mail Order savings program, may need to do the online registration, and this is for Vesicare  rx   Please schedule a Follow-up Appointment to: Return if symptoms worsen or fail to improve.  If you have any other questions or concerns, please feel free to call the office or send a message through MyChart. You may also schedule an earlier appointment if necessary.  Additionally, you may be receiving a survey about your experience at our office within a few days to 1 week by e-mail or mail. We value your feedback.  Marsa Officer, DO [facility], [place]

## 2024-09-29 NOTE — Therapy (Unsigned)
 " OUTPATIENT PHYSICAL THERAPY HIP EVALUATION   Patient Name: Megan Perez MRN: [id number] DOB:12-18-1945, 79 y.o., female Today's Date: 10/02/2024  END OF SESSION:  PT End of Session - 10/02/24 0857     Visit Number 1    Number of Visits 17    Date for Recertification  11/25/24    Authorization Type eval: 09/29/24    PT Start Time 1400    PT Stop Time 1445    PT Time Calculation (min) 45 min    Activity Tolerance Patient tolerated treatment well    Behavior During Therapy WFL for tasks assessed/performed         Past Medical History:  Diagnosis Date   Anxiety    Asthma    reactive airway and shortness of breath w/ exertion in hot temperatures   Bronchitis    GERD (gastroesophageal reflux disease)    Hypertension    Morbid obesity with BMI of 40.0-44.9, adult (HCC) 04/08/2012   OA (osteoarthritis)    knee   Osteoporosis    Prediabetes    Sinus infection    10/2018   Venous (peripheral) insufficiency 08/19/2010   Qualifier: Diagnosis of  By: Vicci MD, Clanford     Past Surgical History:  Procedure Laterality Date   ABDOMINAL HYSTERECTOMY     CARPAL TUNNEL RELEASE Right    CHOLECYSTECTOMY     COLONOSCOPY WITH PROPOFOL  N/A 06/19/2017   Procedure: COLONOSCOPY WITH PROPOFOL ;  Surgeon: Jinny Carmine, MD;  Location: [facility];  Service: Endoscopy;  Laterality: N/A;   HEEL SPUR SURGERY     REPLACEMENT TOTAL KNEE     left   REPLACEMENT TOTAL KNEE Right    SHOULDER ARTHROSCOPY WITH OPEN ROTATOR CUFF REPAIR Right 09/15/2015   Procedure: SHOULDER ARTHROSCOPY WITH OPEN ROTATOR CUFF REPAIR;  Surgeon: Kayla Pinal, MD;  Location: [facility];  Service: Orthopedics;  Laterality: Right;   SHOULDER SURGERY Left    Patient Active Problem List   Diagnosis Date Noted   Osteoarthritis of shoulder (Left) 10/12/2022   Osteoarthritis of glenohumeral joint (Left) 10/12/2022   Osteoarthritis of AC (acromioclavicular) joint (Left) 10/12/2022   Biceps tendinitis of shoulder (Left)  10/02/2022   Chronic shoulder pain (Left) 10/02/2022   Primary insomnia 03/02/2021   Other spondylosis, sacral and sacrococcygeal region 09/14/2020   Muscle spasms of both lower extremities 03/18/2020   Swelling of limb 03/12/2020   Psychophysiological insomnia 02/27/2020   Lymphedema of both lower extremities 02/27/2020   Drug-induced myopathy 02/27/2020   Pure hypercholesterolemia 02/27/2020   Chronic hip pain (Left) 10/15/2019   Chronic sacroiliac joint pain (Left) 10/15/2019   Chronic groin pain (Left) 10/15/2019   Chronic peripheral neuropathic pain 07/07/2019   Neurogenic pain 07/07/2019   Chronic lumbar radiculitis (L5) (Right) 07/07/2019   Chronic musculoskeletal pain 07/07/2019   Chronic low back pain (1ry area of Pain) (Bilateral) (R>L) w/o sciatica 06/12/2019   DDD (degenerative disc disease), lumbar 03/04/2019   Spondylosis without myelopathy or radiculopathy, lumbosacral region 03/04/2019   Chronic sacroiliac joint pain (Bilateral) (R>L) 12/04/2018   Lumbar facet syndrome (Bilateral) (R>L) 12/04/2018   Lumbar (6 mm) Grade 1 Anterolisthesis of L4/L5 and L3/L4 12/04/2018   Sacrococcygeal disorders, not elsewhere classified 12/04/2018   History of knee replacement, total (Left) 12/03/2018   Chronic knee pain after total knee replacement (Bilateral) 12/03/2018   Vitamin D  deficiency 12/03/2018   Chronic low back pain (1ry area of Pain) (Bilateral) (R>L) w/ sciatica (Right) 11/05/2018   Chronic lower extremity pain (2ry  area of Pain) (Right) 11/05/2018   Chronic knee pain (3ry area of Pain) (Bilateral) (R>L) 11/05/2018   Chronic pain syndrome 11/05/2018   Pharmacologic therapy 11/05/2018   Disorder of skeletal system 11/05/2018   Problems influencing health status 11/05/2018   Screening for colorectal cancer    Inguinal hernia, left 05/11/2017   History of total knee replacement (Right) 03/08/2017   Mild intermittent asthma without complication 01/16/2017   Degenerative  spondylolisthesis 08/02/2016   Prediabetes 02/16/2016   Full thickness rotator cuff tear 08/31/2015   Impingement syndrome of shoulder region 08/02/2015   Osteoarthrosis, localized, primary, knee 04/21/2014   Rotator cuff rupture, complete 04/21/2014   Allergic rhinitis 01/21/2014   Morbid obesity with BMI of 40.0-44.9, adult (HCC) 04/08/2012   Arthritis 11/24/2011   Essential hypertension 08/19/2010   Chronic venous insufficiency 08/19/2010   GERD 08/19/2010   PCP: Edman Marsa PARAS, DO  REFERRING PROVIDER: Alvia Selinda PARAS, MD  REFERRING DIAG:  [phone] (ICD-10-CM) - Greater trochanteric pain syndrome of right lower extremity  M51.360 (ICD-10-CM) - Degeneration of intervertebral disc of lumbar region with discogenic back pain   RATIONALE FOR EVALUATION AND TREATMENT: Rehabilitation  THERAPY DIAG: Pain in right hip - Plan: PT plan of care cert/re-cert  Muscle weakness (generalized) - Plan: PT plan of care cert/re-cert  ONSET DATE: Approximately 1 year  FOLLOW-UP APPT SCHEDULED WITH REFERRING PROVIDER: No    SUBJECTIVE:                                                                                                                                                                                         SUBJECTIVE STATEMENT:  R hip pain  PERTINENT HISTORY:  Worsening R hip pain for the last year. Pt states that she saw [facility] last year and was put on a steroid taper which did not help with her pain (medical note reports brief improvement in pain). She also saw Dr. Alvia who gave her a steroid injection which also didn't help. He prescribed her Celebrex  which does help with the pain but it makes me feel a little dizzy. She took two Tylenol  today. Pt is unable to take ibuprofen  due to a history of gastrointestinal bleeding. She is wondering if iontophoresis with dexamethasone  would be helpful for her hip pain. She also complains of intermittent R low back pain.  Denies radiating symptoms down the RLE or radiating symptoms from the low back to the hip.  She reports chronic bilateral feet swelling secondary to lymphedema. Denies history of cancer or lymph node removal but is unsure if her lymphedema is primarily. She states that her vascular provider ordered a LE  pump which she uses very infrequently (it has been >3 weeks since the last use). PMH also includes L total shoulder replacement, bilateral knee replacements, and R rotator cuff surgery    R hip XR: DATE: 11/03/2023 FINDINGS:  No acute fracture or malalignment. Mild degenerative changes of the right hip. Right total knee arthroplasty without adverse hardware features. Surgical clips project over the pelvis.    LUMBAR SPINE - COMPLETE WITH BENDING VIEWS: 10/02/2022: FINDINGS: There is no evidence of lumbar spine fracture. Mild curvature of spine. Grade 1 anterolisthesis of L3-4, L4-5 identified stable compared prior exam. Decreased intervertebral space identified at L5-S1. Facet joint sclerosis of the lower lumbar spine noted.   IMPRESSION: Degenerative joint changes of lumbar spine.  PAIN:    Pain Intensity: Present: 6/10, Best: 0/10, Worst: 10/10 Pain location: R lateral hip and occasional R low back pain Pain Quality: Throbbing; Radiating: No  Numbness/Tingling: Yes, chronic bilateral foot numbness Focal Weakness: Yes, occasional RLE weakness Aggravating factors: standing after resting, pain wakes her up at night; Relieving factors: Celebrex , aspercreme, lidocaine  creme, no improvement with heat (doesn't like ice);  24-hour pain behavior: pain first thing in the morning, improves after taking medicine. Worsens as day progresses.  How long can you stand: 15 minutes; History of prior back or hip injury, pain, surgery, or therapy: Yes, history of back pain and prior history of R hip pain; Dominant hand: right Imaging: Yes, see history; Red flags: Negative for bowel/bladder changes, saddle  paresthesia, h/o spinal tumors, h/o compression fx, personal history of cancer/malignancy, acute hip trauma, night pain, abdominal pain, chills/fever, night sweats, nausea, vomiting, diarrhea, unexplained weight gain/loss;  PRECAUTIONS: None  WEIGHT BEARING RESTRICTIONS: No  FALLS: Has patient fallen in last 6 months? Yes. Number of falls 1 in September, no injury;  Living Environment Lives with: lives alone Lives in: House/apartment (duplex) Stairs: 4 steps with bilateral narrow rails; Has following equipment at home: Single point cane, shower chair, and Grab bars  Prior level of function: Independent  Occupational demands: Retired, worked as a associate professor for 57 years involving prolonged standing and physical activity  Hobbies: crocheting, puzzles  Patient Goals: Decrease pain, I want to be able to walk straighter Pt complains of having to bend forward when walking. She would like to be able to go on her cruise in August and walk with less pain.    OBJECTIVE:  Patient Surveys  Deferred  Cognition Patient is oriented to person, place, and time.  Recent memory is intact.  Remote memory is intact.  Attention span and concentration are intact.  Expressive speech is intact.  Patient's fund of knowledge is within normal limits for educational level.    Gross Musculoskeletal Assessment Tremor: None; Bulk: Normal, no muscle wasting noted; Tone: Normal; No visible step-off along spinal column, no signs of scoliosis, no gross anatomical hip deformities;  GAIT: Ambulates with single point cane in RUE. Deferred full gait assessment to follow-up session.  Posture: Full posture assessment deferred  AROM AROM (Normal range in degrees) AROM   Lumbar   Flexion (65) Mod limitation  Extension (30) Mod limitation *R hip pain  Right lateral flexion (25) Mod limitation *R hip pain  Left lateral flexion (25) Mod limitation  Right rotation (30) Mod limitation  Left rotation (30)  Mod limitation      Hip Right Left  Flexion (125) WNL   Extension (15)    Abduction (40) WNL   Adduction (30) WNL   Internal Rotation (45) WNL*  External Rotation (45) WNL       Knee    Flexion (135) WNL   Extension (0) WNL   (* = pain; Blank rows = not tested)  LE MMT: MMT (out of 5) Right  Left   Hip flexion 3+* 4  Hip extension    Hip abduction    Hip adduction    Hip internal rotation 2+ 5  Hip external rotation 4 5  Knee flexion 5 5  Knee extension 5 5  Ankle dorsiflexion 5 5  Ankle plantarflexion Active Active  (* = pain; Blank rows = not tested)  Sensation Grossly intact to light touch throughout bilateral LEs as determined by testing dermatomes L2-S2. Proprioception, stereognosis, and hot/cold testing deferred on this date.  Reflexes Deferred  Muscle Length Hamstrings: R: Negative L: Not examined Ely (quadriceps): R: Not examined L: Not examined Thomas (hip flexors): R: Not examined L: Not examined Ober: R: Not examined L: Not examined  Palpation Location Right Left         Lumbar paraspinals    Quadratus Lumborum    Iliac Crest    ASIS    Pubic Rami    Pubic symphysis   Femoral Triangle    Inguinal ligament    Gluteus Maximus    Gluteus Medius 1   Deep hip external rotators    Sacrum   PSIS    Fortin's Area (SIJ)    Coccyx   Ischial Tuberosity    Greater Trochanter 2   (Blank rows = not tested) Graded on 0-4 scale (0 = no pain, 1 = pain, 2 = pain with wincing/grimacing/flinching, 3 = pain with withdrawal, 4 = unwilling to allow palpation)  Passive Accessory Motion Hip A/P mobility testing is painless with grossly normal mobility but difficult to assess secondary to habitus. Deferred lumbar PAM accessory testing;  Special Tests Lumbar Radiculopathy and Discogenic: Centralization and Peripheralization (SN 92, -LR 0.12): Not examined Slump (SN 83, -LR 0.32): R: Negative L: Not examined SLR (SN 92, -LR 0.29): R: Negative L:  Not  examined Crossed SLR (SP 90): R: Negative L: Not examined  Facet Joint: Extension-Rotation (SN 100, -LR 0.0): R: Negative L: Negative  Lumbar Foraminal Stenosis: Lumbar quadrant (SN 70): R: Negative L: Positive for R hip pain  Hip: FABER (SN 81): R: Positive for R hip pain L: Not examined FADIR (SN 94): R: Positive for R hip pain L: Not examined Hip scour (SN 50): R: Positive for R hip pain L: Not examined  SIJ:  Thigh Thrust (SN 88, -LR 0.18) : R: Not examined L: Not examined  Piriformis Syndrome: FAIR Test (SN 88, SP 83): R: Not examined L: Not examined  Physical Performance Measures Deferred  Beighton scale Deferred   TODAY'S TREATMENT: Deferred  PATIENT EDUCATION:  Education details: Plan of care Person educated: Patient Education method: Explanation Education comprehension: verbalized understanding   HOME EXERCISE PROGRAM:  Deferred   ASSESSMENT:  CLINICAL IMPRESSION: Patient is a 79 y.o. female who was seen today for physical therapy evaluation and treatment for R hip pain. History and exam consistent with GTPS with focal tenderness to palpation laterally and reproduction with resisted hip abduction. She may also have some contribution from lower lumbar degenerative changes.  OBJECTIVE IMPAIRMENTS: decreased balance, decreased strength, obesity, and pain.   ACTIVITY LIMITATIONS: bending, standing, squatting, and transfers  PARTICIPATION LIMITATIONS: cleaning and community activity  PERSONAL FACTORS: Age, Past/current experiences, Time since onset of injury/illness/exacerbation, and 3+ comorbidities: anxiety, osteoporosis, OA,  venous insufficiency, and obesity are also affecting patient's functional outcome.   REHAB POTENTIAL: Good  CLINICAL DECISION MAKING: Unstable/unpredictable  EVALUATION COMPLEXITY: High   GOALS: Goals reviewed with patient? No  SHORT TERM GOALS: Target date: 10/27/2024  Pt will be independent with HEP in order to improve  strength and decrease hip pain to improve pain-free function at home and work. Baseline:  Goal status: INITIAL   LONG TERM GOALS: Target date: 11/24/2024  Pt will increase R hip IR strength to at least 4/5 in order to demonstrate improvement in strength and function  Baseline: 2+/5 Goal status: INITIAL  2.  Pt will decrease worst hip pain by at least 3 points on the NPRS in order to demonstrate clinically significant reduction in hip pain. Baseline: 10/10; Goal status: INITIAL  3.  Pt will report at least 50% improvement in R hip pain in order to transfer and ambulate without a significant increase in her pain so she can enjoy her cruise in August.       Baseline:  Goal status: INITIAL   PLAN: PT FREQUENCY: 1-2x/week  PT DURATION: 8 weeks  PLANNED INTERVENTIONS: Therapeutic exercises, Therapeutic activity, Neuromuscular re-education, Balance training, Gait training, Patient/Family education, Self Care, Joint mobilization, Joint manipulation, Vestibular training, Canalith repositioning, Orthotic/Fit training, DME instructions, Dry Needling, Electrical stimulation, Spinal manipulation, Spinal mobilization, Cryotherapy, Moist heat, Taping, Traction, Ultrasound, Ionotophoresis 4mg /ml Dexamethasone , Manual therapy, and Re-evaluation.  PLAN FOR NEXT SESSION: test gait, stairs, and sidelying hip abductor strength if able, initiate GTPS strengthening protocol as well as manual techniques as needed.   Radek Carnero D Teralyn Mullins PT, DPT, GCS  Erlin Gardella, PT 10/02/2024, 9:12 AM  "

## 2024-09-29 NOTE — Telephone Encounter (Signed)
 Per patient she has not started PT. She states no one has contacted her about this.  She states the pain is worst in her right hip. She did have some mild relief from cortisone injection. She is taking the increased Celebrex . Her sister is on a dexamethasone  patch for similar hip pain and it has helped her a lot.   Spoke with [place] PT office and they will be contacting patient today to schedule evaluation.   Please advise, thank you!

## 2024-09-30 ENCOUNTER — Other Ambulatory Visit: Payer: Self-pay

## 2024-09-30 NOTE — Telephone Encounter (Signed)
 Spoke with patient and informed her that physical therapy will be administering the dexamethasone  patch during her PT sessions. She verbalized understanding. She was thankful for call.  JM

## 2024-10-01 ENCOUNTER — Other Ambulatory Visit (HOSPITAL_COMMUNITY): Payer: Self-pay

## 2024-10-01 ENCOUNTER — Other Ambulatory Visit: Admitting: Pharmacist

## 2024-10-01 DIAGNOSIS — I10 Essential (primary) hypertension: Secondary | ICD-10-CM

## 2024-10-01 NOTE — Patient Instructions (Signed)
 Please follow up with [facility] regarding having your medications mailed to you:  [facility] [address] [place], [place], [place] [address] [phone]  Thank you!  Sharyle Sia, PharmD, JAQUELINE, CPP Clinical Pharmacist [facility] [phone]

## 2024-10-01 NOTE — Progress Notes (Signed)
 "  10/01/2024 Name: Megan Perez MRN: [id number] DOB: 1946-09-01  Chief Complaint  Patient presents with   Medication Assistance    Megan Perez is a 79 y.o. year old female who presented for a telephone visit.   They were referred to the pharmacist by their PCP for assistance in managing medication access.    Subjective:  Care Team: Primary Care Provider: Edman Marsa PARAS, DO Urologist: Megan Perez, AGNP  Medication Access/Adherence  Current Pharmacy:  [facility] [address] [place] [address] Phone: [phone] Fax: [phone]  [id number] - [place], [place] - [address] AT [facility] OF [address] [place] [address] Phone: [phone] Fax: [phone]   Patient reports affordability concerns with their medications: Yes  Patient reports access/transportation concerns to their pharmacy: No  Patient reports adherence concerns with their medications:  No    Uses weekly pillbox  Reports difficulty with affording cost of her celecoxib  and solifenacin  ([facility] ) as cost for 1 month supply is now $15 per month supply per prescription through [facility]  Hypertension:  Current medications: lisinopril  20 mg daily  Denies checking blood pressure at home. Patient does not have an automated, upper arm home BP cuff  Patient denies hypotensive s/sx including dizziness, lightheadedness.     Objective:  Lab Results  Component Value Date   CREATININE 0.90 07/31/2022   BUN 21 07/31/2022   NA 142 07/31/2022   K 4.3 07/31/2022   CL 105 07/31/2022   CO2 27 07/31/2022    Lab Results  Component Value Date   CHOL 185 07/31/2022   HDL 65 07/31/2022   LDLCALC 105 (H) 07/31/2022   TRIG 66 07/31/2022   CHOLHDL 2.8 07/31/2022   BP Readings from Last 3 Encounters:  09/29/24 130/84  09/01/24 120/80  04/24/24 120/74   Pulse Readings from Last 3 Encounters:   09/29/24 82  09/01/24 89  04/24/24 85      Medications Reviewed Today     Reviewed by Megan Perez, RPH-CPP (Pharmacist) on 10/01/24 at 1628  Med List Status: <None>   Medication Order Taking? Sig Documenting Provider Last Dose Status Informant  acetaminophen  (TYLENOL ) 500 MG tablet [id number] Yes Take 1,300 mg by mouth every 6 (six) hours as needed.  [provider]  Active   albuterol  (VENTOLIN  HFA) 108 (90 Base) MCG/ACT inhaler [id number] Yes Inhale 2 puffs into the lungs every 6 (six) hours as needed for wheezing or shortness of breath. Karamalegos, Marsa PARAS, DO  Active   azelastine (OPTIVAR) 0.05 % ophthalmic solution [id number] Yes Place 2 drops into both eyes daily as needed. [provider]  Active   celecoxib  (CELEBREX ) 100 MG capsule [id number] Yes Take 1 capsule (100 mg total) by mouth 2 (two) times daily. Megan Marsa PARAS, DO  Active   diclofenac  Sodium (VOLTAREN  ARTHRITIS PAIN) 1 % GEL [id number] Yes Apply 2 g topically 4 (four) times daily as needed (knee). Megan Marsa PARAS, DO  Active   fluticasone  (FLONASE ) 50 MCG/ACT nasal spray [id number] Yes Place 2 sprays into both nostrils daily. Use for 4-6 weeks then stop and use seasonally or as needed. Megan Marsa PARAS, DO  Active   lisinopril  (ZESTRIL ) 20 MG tablet [id number] Yes Take 1 tablet (20 mg total) by mouth daily. Megan Marsa PARAS, DO  Active   pantoprazole  (PROTONIX ) 20 MG tablet [id number] Yes Take 1 tablet (20 mg total)  by mouth daily before breakfast. Megan Marsa PARAS, DO  Active   solifenacin  (VESICARE ) 10 MG tablet [id number] Yes Take 10 mg by mouth daily. [provider]  Active               Assessment/Plan:   Comprehensive medication review performed; medication list updated in electronic medical record  From review of [employer] website, note that in 2026, [facility] is not a preferred pharmacy - Note that per  [employer] website, tier 2 medications (including celecoxib  and solifenacin ) copayment is $3 for 30 day supply or $9 for 100 day supply.  Patient states that this cost is affordable  Review with patient the local preferred pharmacies for her HTA plan Patient interested in using [facility] via mail order (update preferred pharmacy list as requested)  Patient plans to [facility]. Note PCP has already sent latest renewal prescriptions to [facility].  - Will ask pharmacy to transfer, fill and mail her solifenacin  prescription from [facility]  Also send secure chat message to [facility] with this request - Patient aware that she will need to provide payment information to [facility] in order to receive medications via mail  Hypertension: - Currently controlled - Reviewed long term cardiovascular and renal outcomes of uncontrolled blood pressure - Discuss importance of medication adherence  - Patient to contact office if having any symptoms or concerns about her blood pressure   Follow Up Plan:   Patient denies further medication questions or concerns today Provide patient with contact information for clinic pharmacist to contact if needed in future for medication questions/concerns   Megan Perez, PharmD, Megan Perez, CPP Clinical Pharmacist [facility] [phone]    "

## 2024-10-02 ENCOUNTER — Other Ambulatory Visit (HOSPITAL_COMMUNITY): Payer: Self-pay

## 2024-10-03 ENCOUNTER — Other Ambulatory Visit (HOSPITAL_COMMUNITY): Payer: Self-pay

## 2024-10-03 ENCOUNTER — Other Ambulatory Visit: Payer: Self-pay

## 2024-10-03 MED ORDER — SOLIFENACIN SUCCINATE 5 MG PO TABS
5.0000 mg | ORAL_TABLET | Freq: Every day | ORAL | 11 refills | Status: AC
  Filled 2024-10-03: qty 30, 30d supply, fill #0

## 2024-10-08 ENCOUNTER — Ambulatory Visit

## 2024-10-08 DIAGNOSIS — M25551 Pain in right hip: Secondary | ICD-10-CM

## 2024-10-08 DIAGNOSIS — M6281 Muscle weakness (generalized): Secondary | ICD-10-CM

## 2024-10-08 NOTE — Therapy (Unsigned)
 " OUTPATIENT PHYSICAL THERAPY HIP TREATMENT   Patient Name: BRITANNI YARDE MRN: [id number] DOB:04-Dec-1945, 79 y.o., female Today's Date: 10/09/2024  END OF SESSION:  PT End of Session - 10/08/24 0804     Visit Number 2    Number of Visits 17    Date for Recertification  11/25/24    Authorization Type eval: 09/29/24    PT Start Time 0803    PT Stop Time 0845    PT Time Calculation (min) 42 min    Activity Tolerance Patient tolerated treatment well    Behavior During Therapy [facility] for tasks assessed/performed         Past Medical History:  Diagnosis Date   Anxiety    Asthma    reactive airway and shortness of breath w/ exertion in hot temperatures   Bronchitis    GERD (gastroesophageal reflux disease)    Hypertension    Morbid obesity with BMI of 40.0-44.9, adult (HCC) 04/08/2012   OA (osteoarthritis)    knee   Osteoporosis    Prediabetes    Sinus infection    10/2018   Venous (peripheral) insufficiency 08/19/2010   Qualifier: Diagnosis of  By: Vicci MD, [place]     Past Surgical History:  Procedure Laterality Date   ABDOMINAL HYSTERECTOMY     CARPAL TUNNEL RELEASE Right    CHOLECYSTECTOMY     COLONOSCOPY WITH PROPOFOL  N/A 06/19/2017   Procedure: COLONOSCOPY WITH PROPOFOL ;  Surgeon: Jinny Carmine, MD;  Location: [facility];  Service: Endoscopy;  Laterality: N/A;   HEEL SPUR SURGERY     REPLACEMENT TOTAL KNEE     left   REPLACEMENT TOTAL KNEE Right    SHOULDER ARTHROSCOPY WITH OPEN ROTATOR CUFF REPAIR Right 09/15/2015   Procedure: SHOULDER ARTHROSCOPY WITH OPEN ROTATOR CUFF REPAIR;  Surgeon: Kayla Pinal, MD;  Location: [facility] ORS;  Service: Orthopedics;  Laterality: Right;   SHOULDER SURGERY Left    Patient Active Problem List   Diagnosis Date Noted   Osteoarthritis of shoulder (Left) 10/12/2022   Osteoarthritis of glenohumeral joint (Left) 10/12/2022   Osteoarthritis of AC (acromioclavicular) joint (Left) 10/12/2022   Biceps tendinitis of shoulder (Left)  10/02/2022   Chronic shoulder pain (Left) 10/02/2022   Primary insomnia 03/02/2021   Other spondylosis, sacral and sacrococcygeal region 09/14/2020   Muscle spasms of both lower extremities 03/18/2020   Swelling of limb 03/12/2020   Psychophysiological insomnia 02/27/2020   Lymphedema of both lower extremities 02/27/2020   Drug-induced myopathy 02/27/2020   Pure hypercholesterolemia 02/27/2020   Chronic hip pain (Left) 10/15/2019   Chronic sacroiliac joint pain (Left) 10/15/2019   Chronic groin pain (Left) 10/15/2019   Chronic peripheral neuropathic pain 07/07/2019   Neurogenic pain 07/07/2019   Chronic lumbar radiculitis (L5) (Right) 07/07/2019   Chronic musculoskeletal pain 07/07/2019   Chronic low back pain (1ry area of Pain) (Bilateral) (R>L) w/o sciatica 06/12/2019   DDD (degenerative disc disease), lumbar 03/04/2019   Spondylosis without myelopathy or radiculopathy, lumbosacral region 03/04/2019   Chronic sacroiliac joint pain (Bilateral) (R>L) 12/04/2018   Lumbar facet syndrome (Bilateral) (R>L) 12/04/2018   Lumbar (6 mm) Grade 1 Anterolisthesis of L4/L5 and L3/L4 12/04/2018   Sacrococcygeal disorders, not elsewhere classified 12/04/2018   History of knee replacement, total (Left) 12/03/2018   Chronic knee pain after total knee replacement (Bilateral) 12/03/2018   Vitamin D  deficiency 12/03/2018   Chronic low back pain (1ry area of Pain) (Bilateral) (R>L) w/ sciatica (Right) 11/05/2018   Chronic lower extremity pain (2ry  area of Pain) (Right) 11/05/2018   Chronic knee pain (3ry area of Pain) (Bilateral) (R>L) 11/05/2018   Chronic pain syndrome 11/05/2018   Pharmacologic therapy 11/05/2018   Disorder of skeletal system 11/05/2018   Problems influencing health status 11/05/2018   Screening for colorectal cancer    Inguinal hernia, left 05/11/2017   History of total knee replacement (Right) 03/08/2017   Mild intermittent asthma without complication 01/16/2017   Degenerative  spondylolisthesis 08/02/2016   Prediabetes 02/16/2016   Full thickness rotator cuff tear 08/31/2015   Impingement syndrome of shoulder region 08/02/2015   Osteoarthrosis, localized, primary, knee 04/21/2014   Rotator cuff rupture, complete 04/21/2014   Allergic rhinitis 01/21/2014   Morbid obesity with BMI of 40.0-44.9, adult (HCC) 04/08/2012   Arthritis 11/24/2011   Essential hypertension 08/19/2010   Chronic venous insufficiency 08/19/2010   GERD 08/19/2010   PCP: Edman Marsa PARAS, DO  REFERRING PROVIDER: Edman Marsa PARAS, DO  REFERRING DIAG:  [phone] (ICD-10-CM) - Greater trochanteric pain syndrome of right lower extremity  M51.360 (ICD-10-CM) - Degeneration of intervertebral disc of lumbar region with discogenic back pain   RATIONALE FOR EVALUATION AND TREATMENT: Rehabilitation  THERAPY DIAG: Pain in right hip  Muscle weakness (generalized)  ONSET DATE: Approximately 1 year  FOLLOW-UP APPT SCHEDULED WITH REFERRING PROVIDER: No   FROM INITIAL EVALUATION SUBJECTIVE:                                                                                                                                                                                         SUBJECTIVE STATEMENT:  R hip pain  PERTINENT HISTORY:  Worsening R hip pain for the last year. Pt states that she saw [facility] last year and was put on a steroid taper which did not help with her pain (medical note reports brief improvement in pain). She also saw Dr. Alvia who gave her a steroid injection which also didn't help. He prescribed her Celebrex  which does help with the pain but it makes me feel a little dizzy. She took two Tylenol  today. Pt is unable to take ibuprofen  due to a history of gastrointestinal bleeding. She is wondering if iontophoresis with dexamethasone  would be helpful for her hip pain. She also complains of intermittent R low back pain. Denies radiating symptoms down the RLE or  radiating symptoms from the low back to the hip.  She reports chronic bilateral feet swelling secondary to lymphedema. Denies history of cancer or lymph node removal but is unsure if her lymphedema is primarily. She states that her vascular provider ordered a LE pump which she uses very infrequently (it has been >3 weeks since  the last use). PMH also includes L total shoulder replacement, bilateral knee replacements, and R rotator cuff surgery   R hip XR: DATE: 11/03/2023 FINDINGS:  No acute fracture or malalignment. Mild degenerative changes of the right hip. Right total knee arthroplasty without adverse hardware features. Surgical clips project over the pelvis.   LUMBAR SPINE - COMPLETE WITH BENDING VIEWS: 10/02/2022: FINDINGS: There is no evidence of lumbar spine fracture. Mild curvature of spine. Grade 1 anterolisthesis of L3-4, L4-5 identified stable compared prior exam. Decreased intervertebral space identified at L5-S1. Facet joint sclerosis of the lower lumbar spine noted.   IMPRESSION: Degenerative joint changes of lumbar spine.  PAIN:    Pain Intensity: Present: 6/10, Best: 0/10, Worst: 10/10 Pain location: R lateral hip and occasional R low back pain Pain Quality: Throbbing; Radiating: No  Numbness/Tingling: Yes, chronic bilateral foot numbness Focal Weakness: Yes, occasional RLE weakness Aggravating factors: standing after resting, pain wakes her up at night; Relieving factors: Celebrex , aspercreme, lidocaine  creme, no improvement with heat (doesn't like ice);  24-hour pain behavior: pain first thing in the morning, improves after taking medicine. Worsens as day progresses.  How long can you stand: 15 minutes; History of prior back or hip injury, pain, surgery, or therapy: Yes, history of back pain and prior history of R hip pain; Dominant hand: right Imaging: Yes, see history; Red flags: Negative for bowel/bladder changes, saddle paresthesia, h/o spinal tumors, h/o compression  fx, personal history of cancer/malignancy, acute hip trauma, night pain, abdominal pain, chills/fever, night sweats, nausea, vomiting, diarrhea, unexplained weight gain/loss;  PRECAUTIONS: None  WEIGHT BEARING RESTRICTIONS: No  FALLS: Has patient fallen in last 6 months? Yes. Number of falls 1 in September, no injury;  Living Environment Lives with: lives alone Lives in: House/apartment (duplex) Stairs: 4 steps with bilateral narrow rails; Has following equipment at home: Single point cane, shower chair, and Grab bars  Prior level of function: Independent  Occupational demands: Retired, worked as a associate professor for 57 years involving prolonged standing and physical activity  Hobbies: crocheting, puzzles  Patient Goals: Decrease pain, I want to be able to walk straighter Pt complains of having to bend forward when walking. She would like to be able to go on her cruise in August and walk with less pain.    OBJECTIVE:  Patient Surveys  Deferred  Cognition Patient is oriented to person, place, and time.  Recent memory is intact.  Remote memory is intact.  Attention span and concentration are intact.  Expressive speech is intact.  Patient's fund of knowledge is within normal limits for educational level.    Gross Musculoskeletal Assessment Tremor: None; Bulk: Normal, no muscle wasting noted; Tone: Normal; No visible step-off along spinal column, no signs of scoliosis, no gross anatomical hip deformities;  GAIT: Ambulates with single point cane in RUE. Deferred full gait assessment to follow-up session.  Posture: Full posture assessment deferred  AROM AROM (Normal range in degrees) AROM   Lumbar   Flexion (65) Mod limitation  Extension (30) Mod limitation *R hip pain  Right lateral flexion (25) Mod limitation *R hip pain  Left lateral flexion (25) Mod limitation  Right rotation (30) Mod limitation  Left rotation (30) Mod limitation      Hip Right Left  Flexion  (125) WNL   Extension (15)    Abduction (40) WNL   Adduction (30) WNL   Internal Rotation (45) WNL*   External Rotation (45) WNL       Knee  Flexion (135) WNL   Extension (0) WNL   (* = pain; Blank rows = not tested)  LE MMT: MMT (out of 5) Right  Left   Hip flexion 3+* 4  Hip extension    Hip abduction    Hip adduction    Hip internal rotation 2+ 5  Hip external rotation 4 5  Knee flexion 5 5  Knee extension 5 5  Ankle dorsiflexion 5 5  Ankle plantarflexion Active Active  (* = pain; Blank rows = not tested)  Sensation Grossly intact to light touch throughout bilateral LEs as determined by testing dermatomes L2-S2. Proprioception, stereognosis, and hot/cold testing deferred on this date.  Reflexes Deferred  Muscle Length Hamstrings: R: Negative L: Not examined Ely (quadriceps): R: Not examined L: Not examined Thomas (hip flexors): R: Not examined L: Not examined Ober: R: Not examined L: Not examined  Palpation Location Right Left         Lumbar paraspinals    Quadratus Lumborum    Iliac Crest    ASIS    Pubic Rami    Pubic symphysis   Femoral Triangle    Inguinal ligament    Gluteus Maximus    Gluteus Medius 1   Deep hip external rotators    Sacrum   PSIS    Fortin's Area (SIJ)    Coccyx   Ischial Tuberosity    Greater Trochanter 2   (Blank rows = not tested) Graded on 0-4 scale (0 = no pain, 1 = pain, 2 = pain with wincing/grimacing/flinching, 3 = pain with withdrawal, 4 = unwilling to allow palpation)  Passive Accessory Motion Hip A/P mobility testing is painless with grossly normal mobility but difficult to assess secondary to habitus. Deferred lumbar PAM accessory testing;  Special Tests Lumbar Radiculopathy and Discogenic: Centralization and Peripheralization (SN 92, -LR 0.12): Not examined Slump (SN 83, -LR 0.32): R: Negative L: Not examined SLR (SN 92, -LR 0.29): R: Negative L:  Not examined Crossed SLR (SP 90): R: Negative L: Not  examined  Facet Joint: Extension-Rotation (SN 100, -LR 0.0): R: Negative L: Negative  Lumbar Foraminal Stenosis: Lumbar quadrant (SN 70): R: Negative L: Positive for R hip pain  Hip: FABER (SN 81): R: Positive for R hip pain L: Not examined FADIR (SN 94): R: Positive for R hip pain L: Not examined Hip scour (SN 50): R: Positive for R hip pain L: Not examined  SIJ:  Thigh Thrust (SN 88, -LR 0.18) : R: Not examined L: Not examined  Piriformis Syndrome: FAIR Test (SN 88, SP 83): R: Not examined L: Not examined  Physical Performance Measures Deferred  Beighton scale Deferred   TODAY'S TREATMENT:   SUBJECTIVE: Pt reports 7/10 resting R hip pain today. She states that she called Dr. Alvia office yesterday and the nurse said that PT would put the patch on her hip today. When PT asked what she referring to she clarified that it was the iontophoresis patch with dexamethasone . No significant changes in her hip pain since the initial evaluation.    PAIN: 7/10;    Ther-ex  NuStep L1-2 x 10 minutes at start of session for BLE strengthening and hip ROM during interval history; Hooklying isometric clams with manual resistance 10s hold x 10; Hooklying adductor ball squeeze with manual resistance x 10; Hooklying bridges 2 x 10; Supine R hip FADIR and FABER stretches x 45s each; HEP issued and reviewed with patient;   Manual Therapy   L sidelying STM  to R glut max/med, TFL, lateral quad, and ITB with Theraband Roller;   PATIENT EDUCATION:  Education details: Plan of care, exercise form/technique and HEP Person educated: Patient Education method: Explanation, Demonstration, Tactile cues, Verbal cues, and Handouts Education comprehension: verbalized understanding, verbal cues required, and needs further education   HOME EXERCISE PROGRAM:  Access Code: [email or web address] URL: [email or web address] Date: 10/08/2024 Prepared by: Selinda Eck  Exercises - Seated  Isometric Hip Abduction with Belt  - 1 x daily - 7 x weekly - 2 sets - 10 reps - 5s hold - Seated Hip Adduction Isometrics with Ball  - 1 x daily - 7 x weekly - 2 sets - 10 reps - 5s hold - Seated March  - 1 x daily - 7 x weekly - 2 sets - 10 reps - 2s hold  Patient Education - Gluteal Tendinopathy Education - Gluteus Medius Tendinopathy   ASSESSMENT:  CLINICAL IMPRESSION: Extensive explanation to pt regarding why iontophoresis wouldn't be a helpful modality for her given that she reports zero benefit from both a steroid taper and a steroid injection in her hip. Maximum penetration of iontophoresis is 10-25mm so medicine wouldn't reach the muscle/bursa. Respectfully offered to refer pt to a different clinic if she is adamant about this modality however she defers at this time. Session focused on extensive education as well as progressive tendon loading starting with isometric contractions. Utilized STM for pain modulation, blood flow, and tissue extensibility around hip. Issued HEP with education about how to perform. Encouraged continued use of LE lymphedema pumps to keep excessive fluid/weight out of BLE. Pt advised to follow-up as scheduled. She will benefit from PT services to address deficits in strength and pain in order to improve pain-free function at home and be able to walk while on her cruise with less pain.   OBJECTIVE IMPAIRMENTS: decreased balance, decreased strength, obesity, and pain.   ACTIVITY LIMITATIONS: bending, standing, squatting, and transfers  PARTICIPATION LIMITATIONS: cleaning and community activity  PERSONAL FACTORS: Age, Past/current experiences, Time since onset of injury/illness/exacerbation, and 3+ comorbidities: anxiety, osteoporosis, OA, venous insufficiency, and obesity are also affecting patient's functional outcome.   REHAB POTENTIAL: Good  CLINICAL DECISION MAKING: Unstable/unpredictable  EVALUATION COMPLEXITY: High   GOALS: Goals reviewed with  patient? No  SHORT TERM GOALS: Target date: 10/27/2024  Pt will be independent with HEP in order to improve strength and decrease hip pain to improve pain-free function at home and work. Baseline:  Goal status: INITIAL   LONG TERM GOALS: Target date: 11/24/2024  Pt will increase R hip IR strength to at least 4/5 in order to demonstrate improvement in strength and function  Baseline: 2+/5 Goal status: INITIAL  2.  Pt will decrease worst hip pain by at least 3 points on the NPRS in order to demonstrate clinically significant reduction in hip pain. Baseline: 10/10; Goal status: INITIAL  3.  Pt will report at least 50% improvement in R hip pain in order to transfer and ambulate without a significant increase in her pain so she can enjoy her cruise in August.       Baseline:  Goal status: INITIAL   PLAN: PT FREQUENCY: 1-2x/week  PT DURATION: 8 weeks  PLANNED INTERVENTIONS: Therapeutic exercises, Therapeutic activity, Neuromuscular re-education, Balance training, Gait training, Patient/Family education, Self Care, Joint mobilization, Joint manipulation, Vestibular training, Canalith repositioning, Orthotic/Fit training, DME instructions, Dry Needling, Electrical stimulation, Spinal manipulation, Spinal mobilization, Cryotherapy, Moist heat, Taping, Traction, Ultrasound, Ionotophoresis 4mg /ml Dexamethasone , Manual therapy, and Re-evaluation.  PLAN FOR NEXT SESSION: test gait, stairs, and sidelying hip abductor strength if able, progress GTPS strengthening protocol as well as manual techniques as needed.   Pavel Gadd D Doloris Servantes PT, DPT, GCS  Durga Saldarriaga, PT 10/09/2024, 8:20 AM  "

## 2024-10-14 NOTE — Therapy (Incomplete)
 " OUTPATIENT PHYSICAL THERAPY HIP TREATMENT   Patient Name: Megan Perez MRN: [id number] DOB:1945-10-24, 79 y.o., female Today's Date: 10/14/2024  END OF SESSION:   Past Medical History:  Diagnosis Date   Anxiety    Asthma    reactive airway and shortness of breath w/ exertion in hot temperatures   Bronchitis    GERD (gastroesophageal reflux disease)    Hypertension    Morbid obesity with BMI of 40.0-44.9, adult (HCC) 04/08/2012   OA (osteoarthritis)    knee   Osteoporosis    Prediabetes    Sinus infection    10/2018   Venous (peripheral) insufficiency 08/19/2010   Qualifier: Diagnosis of  By: Vicci MD, [place]     Past Surgical History:  Procedure Laterality Date   ABDOMINAL HYSTERECTOMY     CARPAL TUNNEL RELEASE Right    CHOLECYSTECTOMY     COLONOSCOPY WITH PROPOFOL  N/A 06/19/2017   Procedure: COLONOSCOPY WITH PROPOFOL ;  Surgeon: Jinny Carmine, MD;  Location: [facility];  Service: Endoscopy;  Laterality: N/A;   HEEL SPUR SURGERY     REPLACEMENT TOTAL KNEE     left   REPLACEMENT TOTAL KNEE Right    SHOULDER ARTHROSCOPY WITH OPEN ROTATOR CUFF REPAIR Right 09/15/2015   Procedure: SHOULDER ARTHROSCOPY WITH OPEN ROTATOR CUFF REPAIR;  Surgeon: Kayla Pinal, MD;  Location: [facility];  Service: Orthopedics;  Laterality: Right;   SHOULDER SURGERY Left    Patient Active Problem List   Diagnosis Date Noted   Osteoarthritis of shoulder (Left) 10/12/2022   Osteoarthritis of glenohumeral joint (Left) 10/12/2022   Osteoarthritis of AC (acromioclavicular) joint (Left) 10/12/2022   Biceps tendinitis of shoulder (Left) 10/02/2022   Chronic shoulder pain (Left) 10/02/2022   Primary insomnia 03/02/2021   Other spondylosis, sacral and sacrococcygeal region 09/14/2020   Muscle spasms of both lower extremities 03/18/2020   Swelling of limb 03/12/2020   Psychophysiological insomnia 02/27/2020   Lymphedema of both lower extremities 02/27/2020   Drug-induced myopathy 02/27/2020    Pure hypercholesterolemia 02/27/2020   Chronic hip pain (Left) 10/15/2019   Chronic sacroiliac joint pain (Left) 10/15/2019   Chronic groin pain (Left) 10/15/2019   Chronic peripheral neuropathic pain 07/07/2019   Neurogenic pain 07/07/2019   Chronic lumbar radiculitis (L5) (Right) 07/07/2019   Chronic musculoskeletal pain 07/07/2019   Chronic low back pain (1ry area of Pain) (Bilateral) (R>L) w/o sciatica 06/12/2019   DDD (degenerative disc disease), lumbar 03/04/2019   Spondylosis without myelopathy or radiculopathy, lumbosacral region 03/04/2019   Chronic sacroiliac joint pain (Bilateral) (R>L) 12/04/2018   Lumbar facet syndrome (Bilateral) (R>L) 12/04/2018   Lumbar (6 mm) Grade 1 Anterolisthesis of L4/L5 and L3/L4 12/04/2018   Sacrococcygeal disorders, not elsewhere classified 12/04/2018   History of knee replacement, total (Left) 12/03/2018   Chronic knee pain after total knee replacement (Bilateral) 12/03/2018   Vitamin D  deficiency 12/03/2018   Chronic low back pain (1ry area of Pain) (Bilateral) (R>L) w/ sciatica (Right) 11/05/2018   Chronic lower extremity pain (2ry area of Pain) (Right) 11/05/2018   Chronic knee pain (3ry area of Pain) (Bilateral) (R>L) 11/05/2018   Chronic pain syndrome 11/05/2018   Pharmacologic therapy 11/05/2018   Disorder of skeletal system 11/05/2018   Problems influencing health status 11/05/2018   Screening for colorectal cancer    Inguinal hernia, left 05/11/2017   History of total knee replacement (Right) 03/08/2017   Mild intermittent asthma without complication 01/16/2017   Degenerative spondylolisthesis 08/02/2016   Prediabetes 02/16/2016   Full thickness  rotator cuff tear 08/31/2015   Impingement syndrome of shoulder region 08/02/2015   Osteoarthrosis, localized, primary, knee 04/21/2014   Rotator cuff rupture, complete 04/21/2014   Allergic rhinitis 01/21/2014   Morbid obesity with BMI of 40.0-44.9, adult (HCC) 04/08/2012   Arthritis  11/24/2011   Essential hypertension 08/19/2010   Chronic venous insufficiency 08/19/2010   GERD 08/19/2010   PCP: Edman Marsa PARAS, DO  REFERRING PROVIDER: Edman Marsa PARAS, DO  REFERRING DIAG:  [phone] (ICD-10-CM) - Greater trochanteric pain syndrome of right lower extremity  M51.360 (ICD-10-CM) - Degeneration of intervertebral disc of lumbar region with discogenic back pain   RATIONALE FOR EVALUATION AND TREATMENT: Rehabilitation  THERAPY DIAG: Pain in right hip  Muscle weakness (generalized)  ONSET DATE: Approximately 1 year  FOLLOW-UP APPT SCHEDULED WITH REFERRING PROVIDER: No   FROM INITIAL EVALUATION SUBJECTIVE:                                                                                                                                                                                         SUBJECTIVE STATEMENT:  R hip pain  PERTINENT HISTORY:  Worsening R hip pain for the last year. Pt states that she saw [facility] last year and was put on a steroid taper which did not help with her pain (medical note reports brief improvement in pain). She also saw Dr. Alvia who gave her a steroid injection which also didn't help. He prescribed her Celebrex  which does help with the pain but it makes me feel a little dizzy. She took two Tylenol  today. Pt is unable to take ibuprofen  due to a history of gastrointestinal bleeding. She is wondering if iontophoresis with dexamethasone  would be helpful for her hip pain. She also complains of intermittent R low back pain. Denies radiating symptoms down the RLE or radiating symptoms from the low back to the hip.  She reports chronic bilateral feet swelling secondary to lymphedema. Denies history of cancer or lymph node removal but is unsure if her lymphedema is primarily. She states that her vascular provider ordered a LE pump which she uses very infrequently (it has been >3 weeks since the last use). PMH also includes L total  shoulder replacement, bilateral knee replacements, and R rotator cuff surgery   R hip XR: DATE: 11/03/2023 FINDINGS:  No acute fracture or malalignment. Mild degenerative changes of the right hip. Right total knee arthroplasty without adverse hardware features. Surgical clips project over the pelvis.   LUMBAR SPINE - COMPLETE WITH BENDING VIEWS: 10/02/2022: FINDINGS: There is no evidence of lumbar spine fracture. Mild curvature of spine. Grade 1 anterolisthesis of L3-4, L4-5 identified stable compared  prior exam. Decreased intervertebral space identified at L5-S1. Facet joint sclerosis of the lower lumbar spine noted.   IMPRESSION: Degenerative joint changes of lumbar spine.  PAIN:    Pain Intensity: Present: 6/10, Best: 0/10, Worst: 10/10 Pain location: R lateral hip and occasional R low back pain Pain Quality: Throbbing; Radiating: No  Numbness/Tingling: Yes, chronic bilateral foot numbness Focal Weakness: Yes, occasional RLE weakness Aggravating factors: standing after resting, pain wakes her up at night; Relieving factors: Celebrex , aspercreme, lidocaine  creme, no improvement with heat (doesn't like ice);  24-hour pain behavior: pain first thing in the morning, improves after taking medicine. Worsens as day progresses.  How long can you stand: 15 minutes; History of prior back or hip injury, pain, surgery, or therapy: Yes, history of back pain and prior history of R hip pain; Dominant hand: right Imaging: Yes, see history; Red flags: Negative for bowel/bladder changes, saddle paresthesia, h/o spinal tumors, h/o compression fx, personal history of cancer/malignancy, acute hip trauma, night pain, abdominal pain, chills/fever, night sweats, nausea, vomiting, diarrhea, unexplained weight gain/loss;  PRECAUTIONS: None  WEIGHT BEARING RESTRICTIONS: No  FALLS: Has patient fallen in last 6 months? Yes. Number of falls 1 in September, no injury;  Living Environment Lives with: lives  alone Lives in: House/apartment (duplex) Stairs: 4 steps with bilateral narrow rails; Has following equipment at home: Single point cane, shower chair, and Grab bars  Prior level of function: Independent  Occupational demands: Retired, worked as a associate professor for 57 years involving prolonged standing and physical activity  Hobbies: crocheting, puzzles  Patient Goals: Decrease pain, I want to be able to walk straighter Pt complains of having to bend forward when walking. She would like to be able to go on her cruise in August and walk with less pain.    OBJECTIVE:  Patient Surveys  Deferred  Cognition Patient is oriented to person, place, and time.  Recent memory is intact.  Remote memory is intact.  Attention span and concentration are intact.  Expressive speech is intact.  Patient's fund of knowledge is within normal limits for educational level.    Gross Musculoskeletal Assessment Tremor: None; Bulk: Normal, no muscle wasting noted; Tone: Normal; No visible step-off along spinal column, no signs of scoliosis, no gross anatomical hip deformities;  GAIT: Ambulates with single point cane in RUE. Deferred full gait assessment to follow-up session.  Posture: Full posture assessment deferred  AROM AROM (Normal range in degrees) AROM   Lumbar   Flexion (65) Mod limitation  Extension (30) Mod limitation *R hip pain  Right lateral flexion (25) Mod limitation *R hip pain  Left lateral flexion (25) Mod limitation  Right rotation (30) Mod limitation  Left rotation (30) Mod limitation      Hip Right Left  Flexion (125) WNL   Extension (15)    Abduction (40) WNL   Adduction (30) WNL   Internal Rotation (45) WNL*   External Rotation (45) WNL       Knee    Flexion (135) WNL   Extension (0) WNL   (* = pain; Blank rows = not tested)  LE MMT: MMT (out of 5) Right  Left   Hip flexion 3+* 4  Hip extension    Hip abduction    Hip adduction    Hip internal  rotation 2+ 5  Hip external rotation 4 5  Knee flexion 5 5  Knee extension 5 5  Ankle dorsiflexion 5 5  Ankle plantarflexion Active Active  (* =  pain; Blank rows = not tested)  Sensation Grossly intact to light touch throughout bilateral LEs as determined by testing dermatomes L2-S2. Proprioception, stereognosis, and hot/cold testing deferred on this date.  Reflexes Deferred  Muscle Length Hamstrings: R: Negative L: Not examined Ely (quadriceps): R: Not examined L: Not examined Thomas (hip flexors): R: Not examined L: Not examined Ober: R: Not examined L: Not examined  Palpation Location Right Left         Lumbar paraspinals    Quadratus Lumborum    Iliac Crest    ASIS    Pubic Rami    Pubic symphysis   Femoral Triangle    Inguinal ligament    Gluteus Maximus    Gluteus Medius 1   Deep hip external rotators    Sacrum   PSIS    Fortin's Area (SIJ)    Coccyx   Ischial Tuberosity    Greater Trochanter 2   (Blank rows = not tested) Graded on 0-4 scale (0 = no pain, 1 = pain, 2 = pain with wincing/grimacing/flinching, 3 = pain with withdrawal, 4 = unwilling to allow palpation)  Passive Accessory Motion Hip A/P mobility testing is painless with grossly normal mobility but difficult to assess secondary to habitus. Deferred lumbar PAM accessory testing;  Special Tests Lumbar Radiculopathy and Discogenic: Centralization and Peripheralization (SN 92, -LR 0.12): Not examined Slump (SN 83, -LR 0.32): R: Negative L: Not examined SLR (SN 92, -LR 0.29): R: Negative L:  Not examined Crossed SLR (SP 90): R: Negative L: Not examined  Facet Joint: Extension-Rotation (SN 100, -LR 0.0): R: Negative L: Negative  Lumbar Foraminal Stenosis: Lumbar quadrant (SN 70): R: Negative L: Positive for R hip pain  Hip: FABER (SN 81): R: Positive for R hip pain L: Not examined FADIR (SN 94): R: Positive for R hip pain L: Not examined Hip scour (SN 50): R: Positive for R hip pain L: Not  examined  SIJ:  Thigh Thrust (SN 88, -LR 0.18) : R: Not examined L: Not examined  Piriformis Syndrome: FAIR Test (SN 88, SP 83): R: Not examined L: Not examined  Physical Performance Measures Deferred  Beighton scale Deferred   TODAY'S TREATMENT:   SUBJECTIVE: Pt reports 7/10 resting R hip pain today. She states that she called Dr. Alvia office yesterday and the nurse said that PT would put the patch on her hip today. When PT asked what she referring to she clarified that it was the iontophoresis patch with dexamethasone . No significant changes in her hip pain since the initial evaluation.    PAIN: 7/10;    Ther-ex  NuStep L1-2 x 10 minutes at start of session for BLE strengthening and hip ROM during interval history; Hooklying isometric clams with manual resistance 10s hold x 10; Hooklying adductor ball squeeze with manual resistance x 10; Hooklying bridges 2 x 10; Supine R hip FADIR and FABER stretches x 45s each; HEP issued and reviewed with patient;   Manual Therapy   L sidelying STM to R glut max/med, TFL, lateral quad, and ITB with Theraband Roller;   PATIENT EDUCATION:  Education details: Plan of care, exercise form/technique and HEP Person educated: Patient Education method: Explanation, Demonstration, Tactile cues, Verbal cues, and Handouts Education comprehension: verbalized understanding, verbal cues required, and needs further education   HOME EXERCISE PROGRAM:  Access Code: [email or web address] URL: [email or web address] Date: 10/08/2024 Prepared by: Selinda Eck  Exercises - Seated Isometric Hip Abduction with Belt  - 1 x daily - 7 x  weekly - 2 sets - 10 reps - 5s hold - Seated Hip Adduction Isometrics with Ball  - 1 x daily - 7 x weekly - 2 sets - 10 reps - 5s hold - Seated March  - 1 x daily - 7 x weekly - 2 sets - 10 reps - 2s hold  Patient Education - Gluteal Tendinopathy Education - Gluteus Medius  Tendinopathy   ASSESSMENT:  CLINICAL IMPRESSION: Extensive explanation to pt regarding why iontophoresis wouldn't be a helpful modality for her given that she reports zero benefit from both a steroid taper and a steroid injection in her hip. Maximum penetration of iontophoresis is 10-80mm so medicine wouldn't reach the muscle/bursa. Respectfully offered to refer pt to a different clinic if she is adamant about this modality however she defers at this time. Session focused on extensive education as well as progressive tendon loading starting with isometric contractions. Utilized STM for pain modulation, blood flow, and tissue extensibility around hip. Issued HEP with education about how to perform. Encouraged continued use of LE lymphedema pumps to keep excessive fluid/weight out of BLE. Pt advised to follow-up as scheduled. She will benefit from PT services to address deficits in strength and pain in order to improve pain-free function at home and be able to walk while on her cruise with less pain.   OBJECTIVE IMPAIRMENTS: decreased balance, decreased strength, obesity, and pain.   ACTIVITY LIMITATIONS: bending, standing, squatting, and transfers  PARTICIPATION LIMITATIONS: cleaning and community activity  PERSONAL FACTORS: Age, Past/current experiences, Time since onset of injury/illness/exacerbation, and 3+ comorbidities: anxiety, osteoporosis, OA, venous insufficiency, and obesity are also affecting patient's functional outcome.   REHAB POTENTIAL: Good  CLINICAL DECISION MAKING: Unstable/unpredictable  EVALUATION COMPLEXITY: High   GOALS: Goals reviewed with patient? No  SHORT TERM GOALS: Target date: 10/27/2024  Pt will be independent with HEP in order to improve strength and decrease hip pain to improve pain-free function at home and work. Baseline:  Goal status: INITIAL   LONG TERM GOALS: Target date: 11/24/2024  Pt will increase R hip IR strength to at least 4/5 in order to  demonstrate improvement in strength and function  Baseline: 2+/5 Goal status: INITIAL  2.  Pt will decrease worst hip pain by at least 3 points on the NPRS in order to demonstrate clinically significant reduction in hip pain. Baseline: 10/10; Goal status: INITIAL  3.  Pt will report at least 50% improvement in R hip pain in order to transfer and ambulate without a significant increase in her pain so she can enjoy her cruise in August.       Baseline:  Goal status: INITIAL   PLAN: PT FREQUENCY: 1-2x/week  PT DURATION: 8 weeks  PLANNED INTERVENTIONS: Therapeutic exercises, Therapeutic activity, Neuromuscular re-education, Balance training, Gait training, Patient/Family education, Self Care, Joint mobilization, Joint manipulation, Vestibular training, Canalith repositioning, Orthotic/Fit training, DME instructions, Dry Needling, Electrical stimulation, Spinal manipulation, Spinal mobilization, Cryotherapy, Moist heat, Taping, Traction, Ultrasound, Ionotophoresis 4mg /ml Dexamethasone , Manual therapy, and Re-evaluation.  PLAN FOR NEXT SESSION: test gait, stairs, and sidelying hip abductor strength if able, progress GTPS strengthening protocol as well as manual techniques as needed.   Fernado Brigante D Mariyanna Mucha PT, DPT, GCS  Bhakti Labella, PT 10/14/2024, 11:10 AM  "

## 2024-10-15 ENCOUNTER — Ambulatory Visit

## 2024-10-22 ENCOUNTER — Ambulatory Visit

## 2024-10-22 DIAGNOSIS — M25551 Pain in right hip: Secondary | ICD-10-CM

## 2024-10-22 DIAGNOSIS — M6281 Muscle weakness (generalized): Secondary | ICD-10-CM

## 2024-10-29 ENCOUNTER — Ambulatory Visit

## 2024-11-05 ENCOUNTER — Ambulatory Visit

## 2024-11-12 ENCOUNTER — Ambulatory Visit

## 2025-09-02 ENCOUNTER — Ambulatory Visit
